# Patient Record
Sex: Female | Born: 1940 | Race: White | Hispanic: No | Smoking: Former smoker
Health system: Southern US, Community
[De-identification: ages and names within clinical notes are randomized; demographics above are authoritative.]

## PROBLEM LIST (undated history)

## (undated) DIAGNOSIS — K625 Hemorrhage of anus and rectum: Secondary | ICD-10-CM

## (undated) DIAGNOSIS — H409 Unspecified glaucoma: Secondary | ICD-10-CM

## (undated) DIAGNOSIS — M797 Fibromyalgia: Secondary | ICD-10-CM

## (undated) DIAGNOSIS — G4730 Sleep apnea, unspecified: Secondary | ICD-10-CM

## (undated) DIAGNOSIS — K5790 Diverticulosis of intestine, part unspecified, without perforation or abscess without bleeding: Secondary | ICD-10-CM

## (undated) DIAGNOSIS — T7840XA Allergy, unspecified, initial encounter: Secondary | ICD-10-CM

## (undated) DIAGNOSIS — J449 Chronic obstructive pulmonary disease, unspecified: Secondary | ICD-10-CM

## (undated) DIAGNOSIS — Z9989 Dependence on other enabling machines and devices: Secondary | ICD-10-CM

## (undated) DIAGNOSIS — K219 Gastro-esophageal reflux disease without esophagitis: Secondary | ICD-10-CM

## (undated) DIAGNOSIS — IMO0002 Reserved for concepts with insufficient information to code with codable children: Secondary | ICD-10-CM

## (undated) DIAGNOSIS — E039 Hypothyroidism, unspecified: Secondary | ICD-10-CM

## (undated) DIAGNOSIS — M810 Age-related osteoporosis without current pathological fracture: Secondary | ICD-10-CM

## (undated) DIAGNOSIS — M533 Sacrococcygeal disorders, not elsewhere classified: Secondary | ICD-10-CM

## (undated) DIAGNOSIS — J439 Emphysema, unspecified: Secondary | ICD-10-CM

## (undated) DIAGNOSIS — B009 Herpesviral infection, unspecified: Secondary | ICD-10-CM

## (undated) DIAGNOSIS — H269 Unspecified cataract: Secondary | ICD-10-CM

## (undated) DIAGNOSIS — M1990 Unspecified osteoarthritis, unspecified site: Secondary | ICD-10-CM

## (undated) DIAGNOSIS — F419 Anxiety disorder, unspecified: Secondary | ICD-10-CM

## (undated) DIAGNOSIS — N183 Chronic kidney disease, stage 3 unspecified: Secondary | ICD-10-CM

## (undated) DIAGNOSIS — K649 Unspecified hemorrhoids: Secondary | ICD-10-CM

## (undated) DIAGNOSIS — G4733 Obstructive sleep apnea (adult) (pediatric): Secondary | ICD-10-CM

## (undated) HISTORY — DX: Unspecified hemorrhoids: K64.9

## (undated) HISTORY — DX: Sleep apnea, unspecified: G47.30

## (undated) HISTORY — PX: SPINE SURGERY: SHX786

## (undated) HISTORY — DX: Diverticulosis of intestine, part unspecified, without perforation or abscess without bleeding: K57.90

## (undated) HISTORY — DX: Unspecified glaucoma: H40.9

## (undated) HISTORY — DX: Unspecified cataract: H26.9

## (undated) HISTORY — DX: Reserved for concepts with insufficient information to code with codable children: IMO0002

## (undated) HISTORY — DX: Anxiety disorder, unspecified: F41.9

## (undated) HISTORY — DX: Fibromyalgia: M79.7

## (undated) HISTORY — DX: Age-related osteoporosis without current pathological fracture: M81.0

## (undated) HISTORY — DX: Allergy, unspecified, initial encounter: T78.40XA

## (undated) HISTORY — DX: Hemorrhage of anus and rectum: K62.5

## (undated) HISTORY — DX: Gastro-esophageal reflux disease without esophagitis: K21.9

## (undated) HISTORY — DX: Dependence on other enabling machines and devices: Z99.89

## (undated) HISTORY — DX: Herpesviral infection, unspecified: B00.9

## (undated) HISTORY — PX: BUNIONECTOMY: SHX129

## (undated) HISTORY — DX: Sacrococcygeal disorders, not elsewhere classified: M53.3

## (undated) HISTORY — PX: TUBAL LIGATION: SHX77

## (undated) HISTORY — PX: OTHER SURGICAL HISTORY: SHX169

## (undated) HISTORY — DX: Chronic obstructive pulmonary disease, unspecified: J44.9

## (undated) HISTORY — PX: MENISCUS REPAIR: SHX5179

## (undated) HISTORY — DX: Obstructive sleep apnea (adult) (pediatric): G47.33

## (undated) HISTORY — PX: EYE SURGERY: SHX253

---

## 1997-10-17 ENCOUNTER — Other Ambulatory Visit: Admission: RE | Admit: 1997-10-17 | Discharge: 1997-10-17 | Payer: Self-pay | Admitting: Family Medicine

## 1999-06-02 ENCOUNTER — Encounter: Admission: RE | Admit: 1999-06-02 | Discharge: 1999-06-02 | Payer: Self-pay | Admitting: Family Medicine

## 1999-06-02 ENCOUNTER — Encounter: Payer: Self-pay | Admitting: Family Medicine

## 1999-09-16 ENCOUNTER — Encounter: Payer: Self-pay | Admitting: Family Medicine

## 1999-09-16 ENCOUNTER — Encounter: Admission: RE | Admit: 1999-09-16 | Discharge: 1999-09-16 | Payer: Self-pay | Admitting: Family Medicine

## 1999-11-11 ENCOUNTER — Encounter: Admission: RE | Admit: 1999-11-11 | Discharge: 1999-11-11 | Payer: Self-pay | Admitting: Family Medicine

## 1999-11-11 ENCOUNTER — Encounter: Payer: Self-pay | Admitting: Family Medicine

## 2000-09-14 ENCOUNTER — Encounter: Payer: Self-pay | Admitting: Family Medicine

## 2000-09-14 ENCOUNTER — Encounter: Admission: RE | Admit: 2000-09-14 | Discharge: 2000-09-14 | Payer: Self-pay | Admitting: Family Medicine

## 2000-09-19 ENCOUNTER — Encounter: Admission: RE | Admit: 2000-09-19 | Discharge: 2000-09-19 | Payer: Self-pay | Admitting: Family Medicine

## 2000-09-19 ENCOUNTER — Encounter: Payer: Self-pay | Admitting: Family Medicine

## 2000-09-21 ENCOUNTER — Encounter: Payer: Self-pay | Admitting: Family Medicine

## 2000-09-21 ENCOUNTER — Encounter: Admission: RE | Admit: 2000-09-21 | Discharge: 2000-09-21 | Payer: Self-pay | Admitting: Family Medicine

## 2000-11-21 ENCOUNTER — Encounter: Admission: RE | Admit: 2000-11-21 | Discharge: 2000-11-21 | Payer: Self-pay | Admitting: Family Medicine

## 2000-11-21 ENCOUNTER — Encounter: Payer: Self-pay | Admitting: Family Medicine

## 2001-09-24 ENCOUNTER — Encounter: Payer: Self-pay | Admitting: Family Medicine

## 2001-09-24 ENCOUNTER — Encounter: Admission: RE | Admit: 2001-09-24 | Discharge: 2001-09-24 | Payer: Self-pay | Admitting: Family Medicine

## 2001-10-11 ENCOUNTER — Encounter: Payer: Self-pay | Admitting: Neurosurgery

## 2001-10-16 ENCOUNTER — Encounter: Payer: Self-pay | Admitting: Neurosurgery

## 2001-10-16 ENCOUNTER — Inpatient Hospital Stay (HOSPITAL_COMMUNITY): Admission: RE | Admit: 2001-10-16 | Discharge: 2001-10-17 | Payer: Self-pay | Admitting: Neurosurgery

## 2001-11-22 ENCOUNTER — Encounter: Payer: Self-pay | Admitting: Neurosurgery

## 2001-11-22 ENCOUNTER — Encounter: Admission: RE | Admit: 2001-11-22 | Discharge: 2001-11-22 | Payer: Self-pay | Admitting: Neurosurgery

## 2002-03-20 ENCOUNTER — Encounter: Payer: Self-pay | Admitting: Neurosurgery

## 2002-03-20 ENCOUNTER — Encounter: Admission: RE | Admit: 2002-03-20 | Discharge: 2002-03-20 | Payer: Self-pay | Admitting: Neurosurgery

## 2002-10-02 ENCOUNTER — Encounter: Payer: Self-pay | Admitting: Family Medicine

## 2002-10-02 ENCOUNTER — Encounter: Admission: RE | Admit: 2002-10-02 | Discharge: 2002-10-02 | Payer: Self-pay | Admitting: Family Medicine

## 2003-04-09 ENCOUNTER — Ambulatory Visit (HOSPITAL_COMMUNITY): Admission: RE | Admit: 2003-04-09 | Discharge: 2003-04-09 | Payer: Self-pay | Admitting: *Deleted

## 2003-10-14 ENCOUNTER — Encounter: Admission: RE | Admit: 2003-10-14 | Discharge: 2003-10-14 | Payer: Self-pay | Admitting: Family Medicine

## 2004-03-25 ENCOUNTER — Other Ambulatory Visit: Admission: RE | Admit: 2004-03-25 | Discharge: 2004-03-25 | Payer: Self-pay | Admitting: Family Medicine

## 2004-10-21 ENCOUNTER — Encounter: Admission: RE | Admit: 2004-10-21 | Discharge: 2004-10-21 | Payer: Self-pay | Admitting: Family Medicine

## 2005-11-02 ENCOUNTER — Encounter: Admission: RE | Admit: 2005-11-02 | Discharge: 2005-11-02 | Payer: Self-pay | Admitting: Family Medicine

## 2006-03-09 ENCOUNTER — Encounter: Admission: RE | Admit: 2006-03-09 | Discharge: 2006-03-09 | Payer: Self-pay | Admitting: Family Medicine

## 2006-05-09 HISTORY — PX: CERVICAL FUSION: SHX112

## 2006-05-09 HISTORY — PX: TRIGGER FINGER RELEASE: SHX641

## 2006-07-27 DIAGNOSIS — G4733 Obstructive sleep apnea (adult) (pediatric): Secondary | ICD-10-CM

## 2006-07-27 HISTORY — DX: Obstructive sleep apnea (adult) (pediatric): G47.33

## 2006-11-06 ENCOUNTER — Encounter: Admission: RE | Admit: 2006-11-06 | Discharge: 2006-11-06 | Payer: Self-pay | Admitting: Family Medicine

## 2007-05-10 DIAGNOSIS — B009 Herpesviral infection, unspecified: Secondary | ICD-10-CM

## 2007-05-10 HISTORY — DX: Herpesviral infection, unspecified: B00.9

## 2007-06-26 ENCOUNTER — Other Ambulatory Visit: Admission: RE | Admit: 2007-06-26 | Discharge: 2007-06-26 | Payer: Self-pay | Admitting: Family Medicine

## 2007-10-09 ENCOUNTER — Encounter (INDEPENDENT_AMBULATORY_CARE_PROVIDER_SITE_OTHER): Payer: Self-pay | Admitting: General Surgery

## 2007-10-09 ENCOUNTER — Ambulatory Visit (HOSPITAL_COMMUNITY): Admission: RE | Admit: 2007-10-09 | Discharge: 2007-10-09 | Payer: Self-pay | Admitting: General Surgery

## 2007-11-21 ENCOUNTER — Encounter: Admission: RE | Admit: 2007-11-21 | Discharge: 2007-11-21 | Payer: Self-pay | Admitting: Family Medicine

## 2008-03-27 ENCOUNTER — Encounter: Admission: RE | Admit: 2008-03-27 | Discharge: 2008-03-27 | Payer: Self-pay | Admitting: Family Medicine

## 2008-12-08 ENCOUNTER — Encounter: Admission: RE | Admit: 2008-12-08 | Discharge: 2008-12-08 | Payer: Self-pay | Admitting: Family Medicine

## 2009-02-12 ENCOUNTER — Other Ambulatory Visit: Admission: RE | Admit: 2009-02-12 | Discharge: 2009-02-12 | Payer: Self-pay | Admitting: Family Medicine

## 2009-06-30 ENCOUNTER — Ambulatory Visit (HOSPITAL_BASED_OUTPATIENT_CLINIC_OR_DEPARTMENT_OTHER): Admission: RE | Admit: 2009-06-30 | Discharge: 2009-06-30 | Payer: Self-pay | Admitting: Orthopedic Surgery

## 2009-12-09 ENCOUNTER — Encounter: Admission: RE | Admit: 2009-12-09 | Discharge: 2009-12-09 | Payer: Self-pay | Admitting: Family Medicine

## 2010-05-31 ENCOUNTER — Encounter: Payer: Self-pay | Admitting: Family Medicine

## 2010-09-21 NOTE — Op Note (Signed)
NAMESTEPHAINE, [name] [name] NO.:  [id number]   MEDICAL RECORD NO.:  [id number]          PATIENT TYPE:  AMB   LOCATION:  DAY                          FACILITY:  [facility]   PHYSICIAN:  Ollen Gross. Vernell Morgans, M.D. DATE OF BIRTH:  [birth year]   DATE OF PROCEDURE:  10/09/2007  DATE OF DISCHARGE:                               OPERATIVE REPORT   PREOPERATIVE DIAGNOSES:  Internal and external hemorrhoids.   POSTOPERATIVE DIAGNOSES:  Internal and external hemorrhoids.   PROCEDURE:  Hemorrhoidectomy x2.   SURGEON:  Dr. Carolynne Edouard III.   ANESTHESIA:  General endotracheal.   DESCRIPTION OF PROCEDURE:  After informed consent was obtained, the  patient was brought to the operating room and left in a supine position  on the stretcher.  After adequate induction of general anesthesia, the  patient was moved into a prone position on the operating room table and  all pressure points were padded.  The patient was then moved into a  jackknife position and the buttocks were retracted laterally with tape.  The perirectal area was prepped with Betadine and draped in the usual  sterile manner.  The perirectal region was then infiltrated with quarter-  percent with Marcaine and 1 mL of Wydase and the tissue was massaged  gently for several minutes.  A small bullet retractor was initially  used, and the patient had significant internal and external hemorrhoids,  but no other abnormalities were noted.  A larger bullet retractor was  then used and two significant columns of internal and external  hemorrhoids were identified.  Each of these columns was grasped with  Allis clamps and elevated.  Each column was scored near its base with a  15 blade knife and then a hemostat clamp was placed across the base of  the hemorrhoid.  The distal portion of the hemorrhoid was then excised  sharply with Metzenbaum scissors.  The incision was then closed with  running 2-0 chromic stitches.  The last couple mm of each  incision were  left open for drainage purposes.  Once this was accomplished, the  incisions were nicely reapproximated.  The areas were completely  hemostatic.  The rectum looked a lot better.  At this point, some  dibucaine ointment and a small piece of Gelfoam were placed in the  rectum and a sterile dressing was applied.  The patient tolerated the  procedure well.  At the end of the case, all needle, sponge and  instrument counts were correct.  The patient was then awakened and taken  to the recovery room in stable condition.      Ollen Gross. Vernell Morgans, M.D.  Electronically Signed     PST/MEDQ  D:  10/09/2007  T:  10/09/2007  Job:  [id number]

## 2010-09-24 NOTE — Op Note (Signed)
[facility]  Patient:    IVIE, SAVITT Visit Number: [id number] MRN: [id number]          Service Type: SUR Location: [id number] Attending Physician:  Mariam Dollar Dictated by:   Garlon Hatchet., M.D. Proc. Date: 10/16/01 Admit Date:  10/16/2001 Discharge Date: 10/17/2001                             Operative Report  PREOPERATIVE DIAGNOSIS:  Cervical spondylosis and left greater than right C6 and C7 radiculopathies.  POSTOPERATIVE DIAGNOSIS:  Cervical spondylosis and left greater than right C6 and C7 radiculopathies.  OPERATION PERFORMED:  Anterior cervical diskectomies and fusion at C5-6 and C6-7 using a 6 mm patellar wedge at C5-6 and a 7 mm patellar wedge at C6-7, 40 mm Atlantis plate with four 13 mm variable angle screws.  SURGEON:  Garlon Hatchet., M.D.  ASSISTANT:  Julio Sicks, M.D.  ANESTHESIA:  General endotracheal.  INDICATIONS FOR PROCEDURE:  The patient is a very pleasant 70 year old female who has had long-standing neck and left and right arm pain with weakness in her triceps and biceps.  This has been going on for several months and has been refractory to anti-inflammatories and physical therapy.  Preoperative imaging showed cervical spondylosis with foraminal stenosis at C5-6 and C6-7. The patient was extensively recommended a two-level anterior cervical diskectomy and fusion and decided to proceed.  Risks and benefits were explained and understood by the patient.  DESCRIPTION OF PROCEDURE:  The patient was brought to the OR, induced under general anesthesia, positioned supine.  Neck in slight extension with 5 pounds of halter traction.  The right side of the neck was prepped and draped in the usual sterile fashion.  A curvilinear incision was made just off the midline to the anterior border of the sternocleidomastoid after preop x-ray localized the needle over the C6 vertebral body.  The superficial layer of the  platysma was dissected and divided longitudinally.  The avascular plane between the sternocleidomastoid and strap muscles was developed down to prevertebral fascia. The prevertebral fascia was dissected with Kitners.  The longus colli was then reflected laterally.  The intraoperative x-ray confirmed localization of the C5-6 disk space.  Annulotomies were then opened with a 15 blade scalpel.  Pituitary rongeurs were used to remove the anterior margin of the annulus.  Disk space was scraped and using a high speed drill, the end plates, the anterior annulus, and nucleus pulposus was drilled down to posterior osteophyte and posterior longitudinal ligament.  Then using a 1 and 2 mm Kerrison punch, the remainder of the osteophytes were removed first at the C5-6 disk space.  There was a large osteophytic process coming off the C5 vertebral body and uncinate process.  This was underbitten with a 1 and 2 mm Kerrison punches exposing the posterior longitudinal ligament.  This was also underbitten in a piecemeal fashion.  Both C6 neural foramina were widely decompressed and the thecal sac was decompressed centrally.  The end plates were then prepared to receive bone graft and Gelfoam was placed.  At the C6-7 disk space, again this procedure was repeated.  Using a 1 and 2 mm Kerrison punch, the very large osteophyte coming off the uncinate processes bilaterally was decompressed and removed and both proximal C7 neural foramina were decompressed.  The thecal sac centrally was decompressed. At the end of diskectomies, both C6 and C7 nerve  roots on both sides were widely decompressed.  Then the two Tutogen patellar wedges were sized, selected and a 7 mm wedge was fashioned and inserted at C6-7 approximately 1 mm deep  to the posterior vertebral body line.  A 6 mm patellar wedge was sized and selected at C5-6 and inserted this about 1 mm deep to the posterior vertebral body line.  A 40 mm Atlantis plate was  sized, selected and inserted after the wedges were in place and six 32 mm variable angled screws were drilled and tapped in place.  Postop x-ray confirmed good placement of bone graft, screws, and plates.  The wound was closed with 3-0 interrupted Vicryl after meticulous hemostasis and copious irrigation with 3-0 interrupted Vicryl in the platysma and 4-0 running subcuticular.  Benzoin and Steri-Strips were applied.  The patient was transferred to the recovery room in stable condition. Dictated by:   Garlon Hatchet., M.D. Attending Physician:  Mariam Dollar DD:  10/16/01 TD:  10/18/01 Job: [id number]

## 2010-11-09 ENCOUNTER — Other Ambulatory Visit: Payer: Self-pay | Admitting: Family Medicine

## 2010-11-09 DIAGNOSIS — Z1231 Encounter for screening mammogram for malignant neoplasm of breast: Secondary | ICD-10-CM

## 2010-12-13 ENCOUNTER — Ambulatory Visit
Admission: RE | Admit: 2010-12-13 | Discharge: 2010-12-13 | Disposition: A | Discharge status: Home / Self Care | Payer: BC Managed Care – PPO | Source: Ambulatory Visit | Attending: Family Medicine | Admitting: Family Medicine

## 2010-12-13 DIAGNOSIS — Z1231 Encounter for screening mammogram for malignant neoplasm of breast: Secondary | ICD-10-CM

## 2011-02-03 LAB — DIFFERENTIAL
Basophils Relative: 1
Eosinophils Absolute: 0.1
Eosinophils Relative: 3
Lymphs Abs: 1.3
Neutrophils Relative %: 51

## 2011-02-03 LAB — CBC
HCT: 36.4
Hemoglobin: 12.7
MCV: 91.3
Platelets: 200
RBC: 3.99
WBC: 4.2

## 2011-02-03 LAB — BASIC METABOLIC PANEL
CO2: 28
Calcium: 9.2
Creatinine, Ser: 0.7
GFR calc Af Amer: >60
GFR calc non Af Amer: >60
Potassium: 3.9
Sodium: 141

## 2011-11-22 ENCOUNTER — Other Ambulatory Visit: Payer: Self-pay | Admitting: Family Medicine

## 2011-11-22 DIAGNOSIS — Z1231 Encounter for screening mammogram for malignant neoplasm of breast: Secondary | ICD-10-CM

## 2011-12-14 ENCOUNTER — Ambulatory Visit
Admission: RE | Admit: 2011-12-14 | Discharge: 2011-12-14 | Disposition: A | Discharge status: Home / Self Care | Payer: BC Managed Care – PPO | Source: Ambulatory Visit | Attending: Family Medicine | Admitting: Family Medicine

## 2011-12-14 DIAGNOSIS — Z1231 Encounter for screening mammogram for malignant neoplasm of breast: Secondary | ICD-10-CM

## 2012-04-03 ENCOUNTER — Other Ambulatory Visit (HOSPITAL_COMMUNITY)
Admission: RE | Admit: 2012-04-03 | Discharge: 2012-04-03 | Disposition: A | Discharge status: Home / Self Care | Payer: BC Managed Care – PPO | Source: Ambulatory Visit | Attending: Family Medicine | Admitting: Family Medicine

## 2012-04-03 ENCOUNTER — Other Ambulatory Visit: Payer: Self-pay | Admitting: Family Medicine

## 2012-04-03 DIAGNOSIS — Z124 Encounter for screening for malignant neoplasm of cervix: Secondary | ICD-10-CM | POA: Insufficient documentation

## 2012-05-03 ENCOUNTER — Other Ambulatory Visit: Payer: Self-pay | Admitting: Family Medicine

## 2012-11-06 ENCOUNTER — Other Ambulatory Visit: Payer: Self-pay

## 2012-11-06 DIAGNOSIS — Z1231 Encounter for screening mammogram for malignant neoplasm of breast: Secondary | ICD-10-CM

## 2012-12-17 ENCOUNTER — Ambulatory Visit
Admission: RE | Admit: 2012-12-17 | Discharge: 2012-12-17 | Disposition: A | Discharge status: Home / Self Care | Payer: BC Managed Care – PPO | Source: Ambulatory Visit

## 2012-12-17 DIAGNOSIS — Z1231 Encounter for screening mammogram for malignant neoplasm of breast: Secondary | ICD-10-CM

## 2013-06-06 ENCOUNTER — Other Ambulatory Visit: Payer: Self-pay | Admitting: Dermatology

## 2013-06-21 ENCOUNTER — Other Ambulatory Visit: Payer: Self-pay | Admitting: Family Medicine

## 2013-09-12 ENCOUNTER — Other Ambulatory Visit (HOSPITAL_COMMUNITY): Payer: Self-pay | Admitting: Urology

## 2013-09-12 DIAGNOSIS — N2889 Other specified disorders of kidney and ureter: Secondary | ICD-10-CM

## 2013-09-20 ENCOUNTER — Ambulatory Visit (HOSPITAL_COMMUNITY)
Admission: RE | Admit: 2013-09-20 | Discharge: 2013-09-20 | Disposition: A | Discharge status: Home / Self Care | Payer: BC Managed Care – PPO | Source: Ambulatory Visit | Attending: Urology | Admitting: Urology

## 2013-09-20 DIAGNOSIS — N289 Disorder of kidney and ureter, unspecified: Secondary | ICD-10-CM | POA: Insufficient documentation

## 2013-09-20 DIAGNOSIS — N2889 Other specified disorders of kidney and ureter: Secondary | ICD-10-CM

## 2013-09-20 DIAGNOSIS — N281 Cyst of kidney, acquired: Secondary | ICD-10-CM | POA: Insufficient documentation

## 2013-09-20 MED ORDER — GADOBENATE DIMEGLUMINE 529 MG/ML IV SOLN
15.0000 mL | Freq: Once | INTRAVENOUS | Status: AC | PRN
  Administered 2013-09-20: 15 mL via INTRAVENOUS

## 2013-11-18 ENCOUNTER — Other Ambulatory Visit: Payer: Self-pay

## 2013-11-18 DIAGNOSIS — Z1231 Encounter for screening mammogram for malignant neoplasm of breast: Secondary | ICD-10-CM

## 2013-12-18 ENCOUNTER — Encounter (INDEPENDENT_AMBULATORY_CARE_PROVIDER_SITE_OTHER): Payer: Self-pay

## 2013-12-18 ENCOUNTER — Ambulatory Visit
Admission: RE | Admit: 2013-12-18 | Discharge: 2013-12-18 | Disposition: A | Discharge status: Home / Self Care | Payer: BC Managed Care – PPO | Source: Ambulatory Visit

## 2013-12-18 DIAGNOSIS — Z1231 Encounter for screening mammogram for malignant neoplasm of breast: Secondary | ICD-10-CM

## 2013-12-20 ENCOUNTER — Other Ambulatory Visit: Payer: Self-pay | Admitting: Family Medicine

## 2013-12-20 DIAGNOSIS — R928 Other abnormal and inconclusive findings on diagnostic imaging of breast: Secondary | ICD-10-CM

## 2013-12-30 ENCOUNTER — Ambulatory Visit
Admission: RE | Admit: 2013-12-30 | Discharge: 2013-12-30 | Disposition: A | Discharge status: Home / Self Care | Payer: BC Managed Care – PPO | Source: Ambulatory Visit | Attending: Family Medicine | Admitting: Family Medicine

## 2013-12-30 DIAGNOSIS — R928 Other abnormal and inconclusive findings on diagnostic imaging of breast: Secondary | ICD-10-CM

## 2014-03-14 ENCOUNTER — Encounter: Payer: Self-pay | Admitting: *Deleted

## 2014-04-10 LAB — COMPREHENSIVE METABOLIC PANEL: Calcium: 9.7 mg/dL

## 2014-07-16 LAB — BASIC METABOLIC PANEL
BUN: 12 mg/dL (ref 4–21)
Creatinine: 0.7 mg/dL (ref 0.5–1.1)
Glucose: 94 mg/dL
Potassium: 4.5 mmol/L (ref 3.4–5.3)
SODIUM: 141 mmol/L (ref 137–147)

## 2014-07-16 LAB — HEPATIC FUNCTION PANEL
ALT: 22 U/L (ref 7–35)
AST: 26 U/L (ref 13–35)
Alkaline Phosphatase: 86 U/L (ref 25–125)

## 2014-07-16 LAB — LIPID PANEL
CHOLESTEROL: 149 mg/dL (ref 0–200)
HDL: 56 mg/dL (ref 35–70)
LDL Cholesterol: 78 mg/dL
TRIGLYCERIDES: 78 mg/dL (ref 40–160)

## 2014-11-07 ENCOUNTER — Other Ambulatory Visit: Payer: Self-pay | Admitting: Family Medicine

## 2014-11-07 DIAGNOSIS — R911 Solitary pulmonary nodule: Secondary | ICD-10-CM

## 2014-11-13 ENCOUNTER — Ambulatory Visit
Admission: RE | Admit: 2014-11-13 | Discharge: 2014-11-13 | Disposition: A | Discharge status: Home / Self Care | Payer: Medicare Other | Source: Ambulatory Visit | Attending: Family Medicine | Admitting: Family Medicine

## 2014-11-13 DIAGNOSIS — R911 Solitary pulmonary nodule: Secondary | ICD-10-CM

## 2015-01-06 ENCOUNTER — Ambulatory Visit (INDEPENDENT_AMBULATORY_CARE_PROVIDER_SITE_OTHER): Payer: Medicare Other | Admitting: Family Medicine

## 2015-01-06 ENCOUNTER — Encounter: Payer: Self-pay | Admitting: Family Medicine

## 2015-01-06 VITALS — BP 111/68 | HR 73 | Wt 161.0 lb

## 2015-01-06 DIAGNOSIS — G4733 Obstructive sleep apnea (adult) (pediatric): Secondary | ICD-10-CM | POA: Diagnosis not present

## 2015-01-06 DIAGNOSIS — H409 Unspecified glaucoma: Secondary | ICD-10-CM

## 2015-01-06 DIAGNOSIS — E785 Hyperlipidemia, unspecified: Secondary | ICD-10-CM | POA: Diagnosis not present

## 2015-01-06 DIAGNOSIS — Z9989 Dependence on other enabling machines and devices: Secondary | ICD-10-CM

## 2015-01-06 DIAGNOSIS — M810 Age-related osteoporosis without current pathological fracture: Secondary | ICD-10-CM

## 2015-01-06 DIAGNOSIS — E782 Mixed hyperlipidemia: Secondary | ICD-10-CM | POA: Insufficient documentation

## 2015-01-06 DIAGNOSIS — Z1231 Encounter for screening mammogram for malignant neoplasm of breast: Secondary | ICD-10-CM

## 2015-01-06 DIAGNOSIS — M797 Fibromyalgia: Secondary | ICD-10-CM

## 2015-01-06 NOTE — Progress Notes (Signed)
CC: [name] [name] is a 74 y.o. female is here for Establish Care   Subjective: HPI:  Very pleasant 74 year old here to establish care, wife of [name] [name]  Reports a history of fibromyalgia. Localized in her shoulders, thighs. It can present for matter of years. It improves when she does physical activity however over the last 2 weeks she's been slacking and symptoms are mildly worsening. It's described as a soreness to the touch. She joined Geophysicist/field seismologist at [employer] and is about to begin a almost daily exercise routine. She denies any new weakness or motor or sensory disturbances. Denies any joint pain. She had some benefit from Cymbalta however got to be too expensive.  She reports a history of multiple abnormal mammograms which have led to diagnostic mammograms which have only revealed fibrocystic breast changes. She's never required a biopsy. She denies any breast complaints today but is now due for routine screening mammogram.  She has a history of obstructive sleep apnea currently using a CPAP machine. She is uncertain of his settings she uses it on nightly basis and states that it helps relieve past nonrestorative sleep  She reports a history of hyperlipidemia. It's unknown how long she's had this condition. She is taking 10 mg of atorvastatin daily. She is not sure when her last lipid panel was checked.  Reports a history of glaucoma and she has seen an ophthalmologist every 6 months. She denies any vision loss.  She reports a history of osteoporosis with No history of fractures. She is currently taking Fosamax. She is uncertain of her most recent DEXA scan but she does believe it was since 2009, the last imaging that we have in our system.  Review of Systems - General ROS: negative for - chills, fever, night sweats, weight gain or weight loss Ophthalmic ROS: negative for - decreased vision Psychological ROS: negative for - anxiety or depression ENT ROS: negative for - hearing  change, nasal congestion, tinnitus or allergies Hematological and Lymphatic ROS: negative for - bleeding problems, bruising or swollen lymph nodes Breast ROS: negative Respiratory ROS: no cough, shortness of breath, or wheezing Cardiovascular ROS: no chest pain or dyspnea on exertion Gastrointestinal ROS: no abdominal pain, change in bowel habits, or black or bloody stools Genito-Urinary ROS: negative for - genital discharge, genital ulcers, incontinence or abnormal bleeding from genitals Musculoskeletal ROS: negative for - joint pain or muscle pain Neurological ROS: negative for - headaches or memory loss Dermatological ROS: negative for lumps, mole changes, rash and skin lesion changes  Past Medical History  Diagnosis Date  . OSA on CPAP 07/27/06  . HSV-2 (herpes simplex virus 2) infection 2009  . Anxiety     intermittent  . Rectal bleeding     with hemorrhoids  . GERD (gastroesophageal reflux disease)   . Fibromyalgia   . Osteoporosis   . blood in stool   . Diverticulosis   . Hemorrhoids   . Glaucoma     No past surgical history on file. Family History  Problem Relation Age of Onset  . Heart attack Father   . Hyperlipidemia Mother   . Hypertension Mother     Social History   Social History  . Marital Status: Married    Spouse Name: N/A  . Number of Children: N/A  . Years of Education: N/A   Occupational History  . Not on file.   Social History Main Topics  . Smoking status: Former Smoker    Quit date: 03/07/1982  .  Smokeless tobacco: Not on file  . Alcohol Use: 0.0 oz/week    0 Standard drinks or equivalent per week     Comment: 2 drinks per month  . Drug Use: No  . Sexual Activity: No   Other Topics Concern  . Not on file   Social History Narrative   Tobacco use cigarettes: former smoker, tobacco history last updated 10/28/2013. No smoking. No tobacco exposure, no alcohol. No caffeine. No recreational drug use. Exercise: yes, some walking-not much.  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 Marital status: married.     Objective: BP 111/68 mmHg  Pulse 73  Wt 161 lb (73.029 kg)  General: Alert and Oriented, No Acute Distress HEENT: Pupils equal, round, reactive to light. Conjunctivae clear.  External ears unremarkable, canals clear with intact TMs with appropriate landmarks.  Middle ear appears open without effusion. Pink inferior turbinates.  Moist mucous membranes, pharynx without inflammation nor lesions.  Neck supple without palpable lymphadenopathy nor abnormal masses. Lungs: Clear to auscultation bilaterally, no wheezing/ronchi/rales.  Comfortable work of breathing. Good air movement. Cardiac: Regular rate and rhythm. Normal S1/S2.  No murmurs, rubs, nor gallops.   Extremities: No peripheral edema.  Strong peripheral pulses.full range of motion and strength in both upper and lower extremities.  Mental Status: No depression, anxiety, nor agitation. Skin: Warm and dry.  Assessment & Plan: Dorothie was seen today for establish care.  Diagnoses and all orders for this visit:  Encounter for screening mammogram for breast cancer -     MM DIGITAL SCREENING BILATERAL  Osteoporosis  Obstructive sleep apnea on CPAP  Hyperlipidemia  Fibromyalgia  Glaucoma   Osteoporosis: Continue Fosamax pending outside records of most recent DEXA scan,I like to have these at least every 2 years. Obstructive sleep apnea: Currently controlled continued nightly CPAP machine use  hyperlipidemia: Continue on atorvastatin daily requesting most recent lipid panel, I would like to see results within the last year. Fibromyalgia: Currently controlled with Zoloft and exercise. Glaucoma: Continue see ophthalmology twice a year.   Return in about 3 months (around 04/08/2015) for Follow Up.

## 2015-01-21 ENCOUNTER — Ambulatory Visit: Payer: Medicare Other

## 2015-01-28 ENCOUNTER — Ambulatory Visit (INDEPENDENT_AMBULATORY_CARE_PROVIDER_SITE_OTHER): Payer: Medicare Other

## 2015-01-28 DIAGNOSIS — Z1231 Encounter for screening mammogram for malignant neoplasm of breast: Secondary | ICD-10-CM

## 2015-02-05 ENCOUNTER — Encounter: Payer: Self-pay | Admitting: Family Medicine

## 2015-02-05 DIAGNOSIS — K589 Irritable bowel syndrome without diarrhea: Secondary | ICD-10-CM | POA: Insufficient documentation

## 2015-02-05 DIAGNOSIS — R918 Other nonspecific abnormal finding of lung field: Secondary | ICD-10-CM | POA: Insufficient documentation

## 2015-02-05 DIAGNOSIS — I2510 Atherosclerotic heart disease of native coronary artery without angina pectoris: Secondary | ICD-10-CM | POA: Insufficient documentation

## 2015-02-05 DIAGNOSIS — K625 Hemorrhage of anus and rectum: Secondary | ICD-10-CM | POA: Insufficient documentation

## 2015-03-24 ENCOUNTER — Telehealth: Payer: Self-pay

## 2015-03-24 MED ORDER — SERTRALINE HCL 50 MG PO TABS: 50.0000 mg | ORAL_TABLET | Freq: Every day | ORAL | Status: DC

## 2015-03-24 NOTE — Telephone Encounter (Signed)
Patient needs a refill on sertraline. Historical medication.

## 2015-03-24 NOTE — Telephone Encounter (Signed)
Rx sent to [facility] on [address] 

## 2015-04-08 ENCOUNTER — Ambulatory Visit (INDEPENDENT_AMBULATORY_CARE_PROVIDER_SITE_OTHER): Payer: Medicare Other | Admitting: Family Medicine

## 2015-04-08 ENCOUNTER — Encounter: Payer: Self-pay | Admitting: Family Medicine

## 2015-04-08 VITALS — BP 130/77 | HR 71 | Wt 161.0 lb

## 2015-04-08 DIAGNOSIS — K589 Irritable bowel syndrome without diarrhea: Secondary | ICD-10-CM

## 2015-04-08 DIAGNOSIS — M797 Fibromyalgia: Secondary | ICD-10-CM | POA: Diagnosis not present

## 2015-04-08 DIAGNOSIS — Z23 Encounter for immunization: Secondary | ICD-10-CM

## 2015-04-08 MED ORDER — ATORVASTATIN CALCIUM 10 MG PO TABS: 10.0000 mg | ORAL_TABLET | Freq: Every day | ORAL | Status: DC

## 2015-04-08 MED ORDER — DULOXETINE HCL 30 MG PO CPEP: 30.0000 mg | ORAL_CAPSULE | Freq: Every day | ORAL | Status: DC

## 2015-04-08 NOTE — Progress Notes (Signed)
CC: [name] CopasYvonne R [name] is a 74 y.o. female is here for Medication Refill   Subjective: HPI:  Follow-up fibromyalgia: Tells me symptoms have not gotten better or worse. It's currently localized in the shoulders but not necessarily inside the joints. Symptoms are improved with exercise. Denies any joint pain or weakness. Denies any overlying skin changes  Follow-up IBS: 1-2 times a week she'll have a day where she feels constipated and has significant abdominal pain localized low in the pelvis. This is relieved if she is able to have a defecation. Occasionally MiraLAX will help. She wants no further something other than Zoloft that she can take. She denies any unintentional weight loss or waking up from pain. She denies any diarrhea or blood in her stool. No nausea or decreased appetite.   Review Of Systems Outlined In HPI  Past Medical History  Diagnosis Date  . OSA on CPAP 07/27/06  . HSV-2 (herpes simplex virus 2) infection 2009  . Anxiety     intermittent  . Rectal bleeding     with hemorrhoids  . GERD (gastroesophageal reflux disease)   . Fibromyalgia   . Osteoporosis   . blood in stool   . Diverticulosis   . Hemorrhoids   . Glaucoma     Past Surgical History  Procedure Laterality Date  . Hemorrhoids removed     Family History  Problem Relation Age of Onset  . Heart attack Father   . Hyperlipidemia Mother   . Hypertension Mother     Social History   Social History  . Marital Status: Married    Spouse Name: N/A  . Number of Children: N/A  . Years of Education: N/A   Occupational History  . Not on file.   Social History Main Topics  . Smoking status: Former Smoker    Quit date: 03/07/1982  . Smokeless tobacco: Not on file  . Alcohol Use: 0.0 oz/week    0 Standard drinks or equivalent per week     Comment: 2 drinks per month  . Drug Use: No  . Sexual Activity: No   Other Topics Concern  . Not on file   Social History Narrative   Tobacco use cigarettes:  former smoker, tobacco history last updated 10/28/2013. No smoking. No tobacco exposure, no alcohol. No caffeine. No recreational drug use. Exercise: yes, some walking-not much. 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 Marital status: married.     Objective: BP 130/77 mmHg  Pulse 71  Wt 161 lb (73.029 kg)  Vital signs reviewed. General: Alert and Oriented, No Acute Distress HEENT: Pupils equal, round, reactive to light. Conjunctivae clear.  External ears unremarkable.  Moist mucous membranes. Lungs: Clear and comfortable work of breathing, speaking in full sentences without accessory muscle use. Cardiac: Regular rate and rhythm.  Neuro: CN II-XII grossly intact, gait normal. Extremities: No peripheral edema.  Strong peripheral pulses.  Mental Status: No depression, anxiety, nor agitation. Logical though process. Skin: Warm and dry.  Assessment & Plan: [name] [name] was seen today for medication refill.  Diagnoses and all orders for this visit:  Fibromyalgia  IBS (irritable bowel syndrome)  Encounter for immunization  Other orders -     atorvastatin (LIPITOR) 10 MG tablet; Take 1 tablet (10 mg total) by mouth daily. -     DULoxetine (CYMBALTA) 30 MG capsule; Take 1 capsule (30 mg total) by mouth daily. -     Flu Vaccine QUAD 36+ mos IM   Fibromyalgia: Stable IBS: uncontrolled  chronic condition,If she can afford Cymbalta we will try this in place of Zoloft. She's been on this in the past and told me it worked from what she can recall however again to get too expensive on her prior insurance plan.   Return if symptoms worsen or fail to improve, for Anytime in December for annual physical..

## 2015-04-15 ENCOUNTER — Encounter: Payer: Self-pay | Admitting: Family Medicine

## 2015-04-23 ENCOUNTER — Ambulatory Visit (INDEPENDENT_AMBULATORY_CARE_PROVIDER_SITE_OTHER): Payer: Medicare Other | Admitting: Family Medicine

## 2015-04-23 ENCOUNTER — Encounter: Payer: Self-pay | Admitting: Family Medicine

## 2015-04-23 VITALS — BP 136/77 | HR 75 | Wt 164.0 lb

## 2015-04-23 DIAGNOSIS — K625 Hemorrhage of anus and rectum: Secondary | ICD-10-CM | POA: Diagnosis not present

## 2015-04-23 DIAGNOSIS — E785 Hyperlipidemia, unspecified: Secondary | ICD-10-CM

## 2015-04-23 DIAGNOSIS — Z0000 Encounter for general adult medical examination without abnormal findings: Secondary | ICD-10-CM

## 2015-04-23 LAB — COMPLETE METABOLIC PANEL WITH GFR
ALBUMIN: 4.1 g/dL (ref 3.6–5.1)
ALK PHOS: 74 U/L (ref 33–130)
ALT: 20 U/L (ref 6–29)
AST: 24 U/L (ref 10–35)
BUN: 15 mg/dL (ref 7–25)
CALCIUM: 9 mg/dL (ref 8.6–10.4)
CO2: 27 mmol/L (ref 20–31)
Chloride: 104 mmol/L (ref 98–110)
Creat: 0.62 mg/dL (ref 0.60–0.93)
GFR, Est African American: >89 mL/min (ref >=60)
GFR, Est Non African American: 88 mL/min (ref >=60)
GLUCOSE: 95 mg/dL (ref 65–99)
Potassium: 4.2 mmol/L (ref 3.5–5.3)
SODIUM: 138 mmol/L (ref 135–146)
Total Bilirubin: 0.9 mg/dL (ref 0.2–1.2)
Total Protein: 7.3 g/dL (ref 6.1–8.1)

## 2015-04-23 LAB — CBC
HEMATOCRIT: 38.2 % (ref 36.0–46.0)
HEMOGLOBIN: 13 g/dL (ref 12.0–15.0)
MCH: 31.6 pg (ref 26.0–34.0)
MCHC: 34 g/dL (ref 30.0–36.0)
MCV: 92.9 fL (ref 78.0–100.0)
MPV: 11.6 fL (ref 8.6–12.4)
Platelets: 236 10*3/uL (ref 150–400)
RBC: 4.11 MIL/uL (ref 3.87–5.11)
RDW: 13.7 % (ref 11.5–15.5)
WBC: 6.2 10*3/uL (ref 4.0–10.5)

## 2015-04-23 LAB — LIPID PANEL
Cholesterol: 156 mg/dL (ref 125–200)
HDL: 61 mg/dL (ref >=46)
LDL Cholesterol: 77 mg/dL (ref <130)
Total CHOL/HDL Ratio: 2.6 Ratio (ref <=5.0)
Triglycerides: 92 mg/dL (ref <150)
VLDL: 18 mg/dL (ref <30)

## 2015-04-23 NOTE — Progress Notes (Signed)
Subjective:    [name] [name] is a 74 y.o. female who presents for Medicare Annual/Subsequent preventive examination.  Preventive Screening-Counseling & Management  Tobacco History  Smoking status  . Former Smoker  . Quit date: 03/07/1982  Smokeless tobacco  . Not on file    Colonoscopy: UTD from 2014 Papsmear: Not indicated Mammogram: UTD from September  DEXA: UTD from 2016  Influenza Vaccine: UTD Pneumovax: UTD Td/Tdap: UTD Zoster: UTD  Requesting complete physical exam with no acute complaints  Problems Prior to Visit 1. HLD  Current Problems (verified) Patient Active Problem List   Diagnosis Date Noted  . ASCVD (arteriosclerotic cardiovascular disease) 02/05/2015  . IBS (irritable bowel syndrome) 02/05/2015  . Rectal bleeding 02/05/2015  . Pulmonary nodules 02/05/2015  . Glaucoma 01/06/2015  . Fibromyalgia 01/06/2015  . Hyperlipidemia 01/06/2015  . Obstructive sleep apnea on CPAP 01/06/2015  . Osteoporosis 01/06/2015    Medications Prior to Visit Current Outpatient Prescriptions on File Prior to Visit  Medication Sig Dispense Refill  . alendronate (FOSAMAX) 70 MG tablet Take 70 mg by mouth once a week. Take with a full glass of water on an empty stomach.    Marland Kitchen aspirin 81 MG tablet Take 81 mg by mouth daily.    Marland Kitchen atorvastatin (LIPITOR) 10 MG tablet Take 1 tablet (10 mg total) by mouth daily. 90 tablet 1  . DULoxetine (CYMBALTA) 30 MG capsule Take 1 capsule (30 mg total) by mouth daily. 30 capsule 3  . Glucosamine-Chondroit-Vit C-Mn (GLUCOSAMINE CHONDR 500 COMPLEX PO) Take by mouth.    . hydrocortisone (ANUSOL-HC) 25 MG suppository Place 25 mg rectally.    Marland Kitchen loratadine (CLARITIN) 10 MG tablet Take 10 mg by mouth.    . Multiple Vitamin (MULTIVITAMINS PO) Take by mouth.    . polyethylene glycol (MIRALAX / GLYCOLAX) packet Take 17 g by mouth daily.    . sertraline (ZOLOFT) 50 MG tablet Take 1 tablet (50 mg total) by mouth daily. 90 tablet 1  . timolol  (BETIMOL) 0.5 % ophthalmic solution 1 drop.    . Vit C-Cholecalciferol-Rose Hip 347 101 9460-20 MG-UNIT-MG CAPS Take by mouth.     No current facility-administered medications on file prior to visit.    Current Medications (verified) Current Outpatient Prescriptions  Medication Sig Dispense Refill  . alendronate (FOSAMAX) 70 MG tablet Take 70 mg by mouth once a week. Take with a full glass of water on an empty stomach.    Marland Kitchen aspirin 81 MG tablet Take 81 mg by mouth daily.    Marland Kitchen atorvastatin (LIPITOR) 10 MG tablet Take 1 tablet (10 mg total) by mouth daily. 90 tablet 1  . DULoxetine (CYMBALTA) 30 MG capsule Take 1 capsule (30 mg total) by mouth daily. 30 capsule 3  . Glucosamine-Chondroit-Vit C-Mn (GLUCOSAMINE CHONDR 500 COMPLEX PO) Take by mouth.    . hydrocortisone (ANUSOL-HC) 25 MG suppository Place 25 mg rectally.    Marland Kitchen loratadine (CLARITIN) 10 MG tablet Take 10 mg by mouth.    . Multiple Vitamin (MULTIVITAMINS PO) Take by mouth.    . polyethylene glycol (MIRALAX / GLYCOLAX) packet Take 17 g by mouth daily.    . sertraline (ZOLOFT) 50 MG tablet Take 1 tablet (50 mg total) by mouth daily. 90 tablet 1  . timolol (BETIMOL) 0.5 % ophthalmic solution 1 drop.    . Vit C-Cholecalciferol-Rose Hip 347 101 9460-20 MG-UNIT-MG CAPS Take by mouth.     No current facility-administered medications for this visit.     Allergies (verified) Alphagan p;  Atorvastatin; Codeine; Penicillins; and Sulfur   PAST HISTORY  Family History Family History  Problem Relation Age of Onset  . Heart attack Father   . Hyperlipidemia Mother   . Hypertension Mother     Social History Social History  Substance Use Topics  . Smoking status: Former Smoker    Quit date: 03/07/1982  . Smokeless tobacco: Not on file  . Alcohol Use: 0.0 oz/week    0 Standard drinks or equivalent per week     Comment: 2 drinks per month     Are there smokers in your home (other than you)? No  Risk Factors Current exercise habits: 2x a  week at [facility]  Dietary issues discussed: DASH   Cardiac risk factors: advanced age (older than 58 for men, 110 for women), dyslipidemia and obesity (BMI >= 30 kg/m2).  Depression Screen (Note: if answer to either of the following is "Yes", a more complete depression screening is indicated)   Over the past two weeks, have you felt down, depressed or hopeless? No  Over the past two weeks, have you felt little interest or pleasure in doing things? No  Have you lost interest or pleasure in daily life? No  Do you often feel hopeless? No  Do you cry easily over simple problems? No  Activities of Daily Living In your present state of health, do you have any difficulty performing the following activities?:  Driving? No Managing money?  No Feeding yourself? No Getting from bed to chair? No Climbing a flight of stairs? No Preparing food and eating?: No Bathing or showering? No Getting dressed: No Getting to the toilet? No Using the toilet:No Moving around from place to place: No In the past year have you fallen or had a near fall?:No   Are you sexually active?  No  Do you have more than one partner?  No  Hearing Difficulties: No Do you often ask people to speak up or repeat themselves? No Do you experience ringing or noises in your ears? No Do you have difficulty understanding soft or whispered voices? No   Do you feel that you have a problem with memory? No  Do you often misplace items? No  Do you feel safe at home?  Yes  Cognitive Testing  Alert? Yes  Normal Appearance?Yes  Oriented to person? Yes  Place? Yes   Time? Yes  Recall of three objects?  Yes  Can perform simple calculations? Yes  Displays appropriate judgment?Yes  Can read the correct time from a watch face?Yes   Advanced Directives have been discussed with the patient? Yes  List the Names of Other Physician/Practitioners you currently use: 1.  [facility]  Indicate any recent Medical Services you may have received  from other than [facility] providers in the past year (date may be approximate).  Immunization History  Administered Date(s) Administered  . Influenza,inj,Quad PF,36+ Mos 04/08/2015  . Pneumococcal Conjugate-13 04/10/2014  . Pneumococcal Polysaccharide-23 03/17/2004  . Tdap 06/26/2007  . Zoster 03/01/2006    Screening Tests Health Maintenance  Topic Date Due  . INFLUENZA VACCINE  12/08/2015  . TETANUS/TDAP  06/25/2017  . DEXA SCAN  Completed  . ZOSTAVAX  Completed  . PNA vac Low Risk Adult  Completed    All answers were reviewed with the patient and necessary referrals were made:  Laren Boom, DO   04/23/2015   History reviewed: allergies, current medications, past family history, past medical history, past social history, past surgical history and problem list  Review of Systems Review of Systems - General ROS: negative for - chills, fever, night sweats, weight gain or weight loss Ophthalmic ROS: negative for - decreased vision Psychological ROS: negative for - anxiety or depression ENT ROS: negative for - hearing change, nasal congestion, tinnitus or allergies Hematological and Lymphatic ROS: negative for - bleeding problems, bruising or swollen lymph nodes Breast ROS: negative Respiratory ROS: no cough, shortness of breath, or wheezing Cardiovascular ROS: no chest pain or dyspnea on exertion Gastrointestinal ROS: no abdominal pain, change in bowel habits, or black or bloody stools Genito-Urinary ROS: negative for - genital discharge, genital ulcers, incontinence or abnormal bleeding from genitals Musculoskeletal ROS: negative for - joint pain or muscle pain Neurological ROS: negative for - headaches or memory loss Dermatological ROS: negative for lumps, mole changes, rash and skin lesion changes   Objective:     Vision by Snellen chart: right eye:20/30, left eye:20/40  There is no height or weight on file to calculate BMI. There were no vitals taken for this  visit.  General: No Acute Distress HEENT: Atraumatic, normocephalic, conjunctivae normal without scleral icterus.  No nasal discharge, hearing grossly intact, TMs with good landmarks bilaterally with no middle ear abnormalities, posterior pharynx clear without oral lesions. Neck: Supple, trachea midline, no cervical nor supraclavicular adenopathy. Pulmonary: Clear to auscultation bilaterally without wheezing, rhonchi, nor rales. Cardiac: Regular rate and rhythm.  No murmurs, rubs, nor gallops. No peripheral edema.  2+ peripheral pulses bilaterally. Abdomen: Bowel sounds normal.  No masses.  Non-tender without rebound.  Negative Murphy's sign. MSK: Grossly intact, no signs of weakness.  Full strength throughout upper and lower extremities.  Full ROM in upper and lower extremities.  No midline spinal tenderness. Neuro: Gait unremarkable, CN II-XII grossly intact.  C5-C6 Reflex 2/4 Bilaterally, L4 Reflex 2/4 Bilaterally.  Cerebellar function intact. Skin: No rashes. Psych: Alert and oriented to person/place/time.  Thought process normal. No anxiety/depression.      Assessment:     Minor vision loss otherwise normal exam. Up-to-date on preventative services other than blood work today.     Plan:     During the course of the visit the patient was educated and counseled about appropriate screening and preventive services including:    Labs  Diet review for nutrition referral? Not Indicated ____   Patient Instructions (the written plan) was given to the patient.  Medicare Attestation I have personally reviewed: The patient's medical and social history Their use of alcohol, tobacco or illicit drugs Their current medications and supplements The patient's functional ability including ADLs,fall risks, home safety risks, cognitive, and hearing and visual impairment Diet and physical activities Evidence for depression or mood disorders  The patient's weight, height, BMI, and visual  acuity have been recorded in the chart.  I have made referrals, counseling, and provided education to the patient based on review of the above and I have provided the patient with a written personalized care plan for preventive services.     Laren BoomHommel, Armistead Sult, DO   04/23/2015

## 2015-04-23 NOTE — Patient Instructions (Signed)
Dr. Grantley Savage's General Advice Following Your Complete Physical Exam  The Benefits of Regular Exercise: Unless you suffer from an uncontrolled cardiovascular condition, studies strongly suggest that regular exercise and physical activity will add to both the quality and length of your life.  The World Health Organization recommends 150 minutes of moderate intensity aerobic activity every week.  This is best split over 3-4 days a week, and can be as simple as a brisk walk for just over 35 minutes "most days of the week".  This type of exercise has been shown to lower LDL-Cholesterol, lower average blood sugars, lower blood pressure, lower cardiovascular disease risk, improve memory, and increase one's overall sense of wellbeing.  The addition of anaerobic (or "strength training") exercises offers additional benefits including but not limited to increased metabolism, prevention of osteoporosis, and improved overall cholesterol levels.  How Can I Strive For A Low-Fat Diet?: Current guidelines recommend that 25-35 percent of your daily energy (food) intake should come from fats.  One might ask how can this be achieved without having to dissect each meal on a daily basis?  Switch to skim or 1% milk instead of whole milk.  Focus on lean meats such as ground turkey, fresh fish, baked chicken, and lean cuts of beef as your source of dietary protein.  Limit saturated fat consumption to less than 10% of your daily caloric intake.  Limit trans fatty acid consumption primarily by limiting synthetic trans fats such as partially hydrogenated oils (Ex: fried fast foods).  Substitute olive or vegetable oil for solid fats where possible.  Moderation of Salt Intake: Provided you don't carry a diagnosis of congestive heart failure nor renal failure, I recommend a daily allowance of no more than 2300 mg of salt (sodium).  Keeping under this daily goal is associated with a decreased risk of cardiovascular events, creeping  above it can lead to elevated blood pressures and increases your risk of cardiovascular events.  Milligrams (mg) of salt is listed on all nutrition labels, and your daily intake can add up faster than you think.  Most canned and frozen dinners can pack in over half your daily salt allowance in one meal.    Lifestyle Health Risks: Certain lifestyle choices carry specific health risks.  As you may already know, tobacco use has been associated with increasing one's risk of cardiovascular disease, pulmonary disease, numerous cancers, among many other issues.  What you may not know is that there are medications and nicotine replacement strategies that can more than double your chances of successfully quitting.  I would be thrilled to help manage your quitting strategy if you currently use tobacco products.  When it comes to alcohol use, I've yet to find an "ideal" daily allowance.  Provided an individual does not have a medical condition that is exacerbated by alcohol consumption, general guidelines determine "safe drinking" as no more than two standard drinks for a man or no more than one standard drink for a female per day.  However, much debate still exists on whether any amount of alcohol consumption is technically "safe".  My general advice, keep alcohol consumption to a minimum for general health promotion.  If you or others believe that alcohol, tobacco, or recreational drug use is interfering with your life, I would be happy to provide confidential counseling regarding treatment options.  General "Over The Counter" Nutrition Advice: Postmenopausal women should aim for a daily calcium intake of 1200 mg, however a significant portion of this might already be   provided by diets including milk, yogurt, cheese, and other dairy products.  Vitamin D has been shown to help preserve bone density, prevent fatigue, and has even been shown to help reduce falls in the elderly.  Ensuring a daily intake of 800 Units of  Vitamin D is a good place to start to enjoy the above benefits, we can easily check your Vitamin D level to see if you'd potentially benefit from supplementation beyond 800 Units a day.  Folic Acid intake should be of particular concern to women of childbearing age.  Daily consumption of 400-800 mcg of Folic Acid is recommended to minimize the chance of spinal cord defects in a fetus should pregnancy occur.    For many adults, accidents still remain one of the most common culprits when it comes to cause of death.  Some of the simplest but most effective preventitive habits you can adopt include regular seatbelt use, proper helmet use, securing firearms, and regularly testing your smoke and carbon monoxide detectors.  Sonoma Firkus B. Haston Casebolt DO [facility] [address], [address] [place], [place] [address] Phone: [phone]  

## 2015-06-09 ENCOUNTER — Other Ambulatory Visit: Payer: Self-pay | Admitting: Family Medicine

## 2015-07-22 ENCOUNTER — Ambulatory Visit (INDEPENDENT_AMBULATORY_CARE_PROVIDER_SITE_OTHER): Payer: Medicare Other

## 2015-07-22 ENCOUNTER — Encounter: Payer: Self-pay | Admitting: Family Medicine

## 2015-07-22 ENCOUNTER — Ambulatory Visit (INDEPENDENT_AMBULATORY_CARE_PROVIDER_SITE_OTHER): Payer: Medicare Other | Admitting: Family Medicine

## 2015-07-22 VITALS — BP 136/74 | HR 87 | Wt 162.0 lb

## 2015-07-22 DIAGNOSIS — M797 Fibromyalgia: Secondary | ICD-10-CM

## 2015-07-22 DIAGNOSIS — M25551 Pain in right hip: Secondary | ICD-10-CM

## 2015-07-22 MED ORDER — DULOXETINE HCL 30 MG PO CPEP: ORAL_CAPSULE | ORAL | Status: DC

## 2015-07-22 NOTE — Progress Notes (Signed)
CC: [name] CopasYvonne R [name] is a 75 y.o. female is here for Medication Refill and Hip Pain   Subjective: HPI:  Follow fibromyalgia: She tells me she had almost no symptoms to complain about with respect to muscle belly pain and skin tenderness since starting on Cymbalta. She is able to afford the medication and causes no known side effects. She denies any muscle weakness or pain other than that described below.  For the past month she's felt hip pain localized in the buttock that radiates into the right anterior hip in the groin area. It's worse with twisting motions. It's worse when bearing weight. Nothing else seems to make it better or worse. No interventions as of yet. She denies any accidents. She denies any genitourinary complaints or gastrointestinal complaints. Symptoms are mild in severity. Slightly better if she rubs where the site of pain is. She denies any pain over the greater trochanter   Review Of Systems Outlined In HPI  Past Medical History  Diagnosis Date  . OSA on CPAP 07/27/06  . HSV-2 (herpes simplex virus 2) infection 2009  . Anxiety     intermittent  . Rectal bleeding     with hemorrhoids  . GERD (gastroesophageal reflux disease)   . Fibromyalgia   . Osteoporosis   . blood in stool   . Diverticulosis   . Hemorrhoids   . Glaucoma     Past Surgical History  Procedure Laterality Date  . Hemorrhoids removed     Family History  Problem Relation Age of Onset  . Heart attack Father   . Hyperlipidemia Mother   . Hypertension Mother     Social History   Social History  . Marital Status: Married    Spouse Name: N/A  . Number of Children: N/A  . Years of Education: N/A   Occupational History  . Not on file.   Social History Main Topics  . Smoking status: Former Smoker    Quit date: 03/07/1982  . Smokeless tobacco: Not on file  . Alcohol Use: 0.0 oz/week    0 Standard drinks or equivalent per week     Comment: 2 drinks per month  . Drug Use: No  .  Sexual Activity: No   Other Topics Concern  . Not on file   Social History Narrative   Tobacco use cigarettes: former smoker, tobacco history last updated 10/28/2013. No smoking. No tobacco exposure, no alcohol. No caffeine. No recreational drug use. Exercise: yes, some walking-not much. 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 Marital status: married.     Objective: BP 136/74 mmHg  Pulse 87  Wt 162 lb (73.483 kg)  Vital signs reviewed. General: Alert and Oriented, No Acute Distress HEENT: Pupils equal, round, reactive to light. Conjunctivae clear.  External ears unremarkable.  Moist mucous membranes. Lungs: Clear and comfortable work of breathing, speaking in full sentences without accessory muscle use. Cardiac: Regular rate and rhythm.  Neuro: CN II-XII grossly intact, gait normal. Extremities: No peripheral edema.  Strong peripheral pulses. No pain with internal rotation or external rotation of the right femur. No pain with palpation of the right greater trochanter, normal gait. Mental Status: No depression, anxiety, nor agitation. Logical though process. Skin: Warm and dry.  Assessment & Plan: [name] [name] was seen today for medication refill and hip pain.  Diagnoses and all orders for this visit:  Right hip pain -     DULoxetine (CYMBALTA) 30 MG capsule; Take 1 capsule by mouth  daily -  DG HIP UNILAT WITH PELVIS 2-3 VIEWS RIGHT  Fibromyalgia   Right hip pain: Obtain plain films to look and the possibility of osteoarthritis Fibromyalgia: Controlled with Cymbalta    Return if symptoms worsen or fail to improve.

## 2015-07-27 ENCOUNTER — Encounter: Payer: Self-pay | Admitting: Family Medicine

## 2015-07-27 ENCOUNTER — Ambulatory Visit (INDEPENDENT_AMBULATORY_CARE_PROVIDER_SITE_OTHER): Payer: Medicare Other | Admitting: Family Medicine

## 2015-07-27 ENCOUNTER — Telehealth: Payer: Self-pay

## 2015-07-27 VITALS — BP 116/76 | HR 86 | Wt 165.0 lb

## 2015-07-27 DIAGNOSIS — M7061 Trochanteric bursitis, right hip: Secondary | ICD-10-CM

## 2015-07-27 DIAGNOSIS — M1611 Unilateral primary osteoarthritis, right hip: Secondary | ICD-10-CM | POA: Insufficient documentation

## 2015-07-27 MED ORDER — HYDROCORTISONE ACETATE 25 MG RE SUPP: 25.0000 mg | Freq: Three times a day (TID) | RECTAL | Status: DC | PRN

## 2015-07-27 NOTE — Patient Instructions (Signed)
Thank you for coming in today. Go to PT.  Return in 4 weeks.   Hip Bursitis Bursitis is a swelling and soreness (inflammation) of a fluid-filled sac (bursa). This sac overlies and protects the joints.  CAUSES   Injury.  Overuse of the muscles surrounding the joint.  Arthritis.  Gout.  Infection.  Cold weather.  Inadequate warm-up and conditioning prior to activities. The cause may not be known.  SYMPTOMS   Mild to severe irritation.  Tenderness and swelling over the outside of the hip.  Pain with motion of the hip.  If the bursa becomes infected, a fever may be present. Redness, tenderness, and warmth will develop over the hip. Symptoms usually lessen in 3 to 4 weeks with treatment, but can come back. TREATMENT If conservative treatment does not work, your caregiver may advise draining the bursa and injecting cortisone into the area. This may speed up the healing process. This may also be used as an initial treatment of choice. HOME CARE INSTRUCTIONS   Apply ice to the affected area for 15-20 minutes every 3 to 4 hours while awake for the first 2 days. Put the ice in a plastic bag and place a towel between the bag of ice and your skin.  Rest the painful joint as much as possible, but continue to put the joint through a normal range of motion at least 4 times per day. When the pain lessens, begin normal, slow movements and usual activities to help prevent stiffness of the hip.  Only take over-the-counter or prescription medicines for pain, discomfort, or fever as directed by your caregiver.  Use crutches to limit weight bearing on the hip joint, if advised.  Elevate your painful hip to reduce swelling. Use pillows for propping and cushioning your legs and hips.  Gentle massage may provide comfort and decrease swelling. SEEK IMMEDIATE MEDICAL CARE IF:   Your pain increases even during treatment, or you are not improving.  You have a fever.  You have heat and  inflammation over the involved bursa.  You have any other questions or concerns. MAKE SURE YOU:   Understand these instructions.  Will watch your condition.  Will get help right away if you are not doing well or get worse.   This information is not intended to replace advice given to you by your health care provider. Make sure you discuss any questions you have with your health care provider.   Document Released: 10/15/2001 Document Revised: 07/18/2011 Document Reviewed: 11/25/2014 Elsevier Interactive Patient Education [employer]2016 Elsevier Inc.

## 2015-07-27 NOTE — Telephone Encounter (Signed)
Pt is requesting a refill of hydrocortisone (ANUSOL-HC) 25 MG suppository. This is a historical medication. If appropriate please send to [facility] per patient request. Thanks.

## 2015-07-27 NOTE — Progress Notes (Signed)
   Subjective:    I'm seeing this patient as a consultation for:  Dr. Ivan AnchorsHommel  CC: Hip pain  HPI: Patient itchy years of the lateral and anterior and posterior hip pain. Patient denies any specific injury. She's been evaluated by her PCP who suspects DJD. She notes the pain is predominantly located the lateral hip and is worse when she lies on that side arises from a seated position. She's done exercises the past that have helped but has never had full evaluation for this problem yet. She takes over-the-counter medicines occasionally for pain. This helped some.  Past medical history, Surgical history, Family history not pertinant except as noted below, Social history, Allergies, and medications have been entered into the medical record, reviewed, and no changes needed.   Review of Systems: No headache, visual changes, nausea, vomiting, diarrhea, constipation, dizziness, abdominal pain, skin rash, fevers, chills, night sweats, weight loss, swollen lymph nodes, body aches, joint swelling, muscle aches, chest pain, shortness of breath, mood changes, visual or auditory hallucinations.   Objective:    Filed Vitals:   07/27/15 1040  BP: 116/76  Pulse: 86   General: Well Developed, well nourished, and in no acute distress.  Neuro/Psych: Alert and oriented x3, extra-ocular muscles intact, able to move all 4 extremities, sensation grossly intact. Skin: Warm and dry, no rashes noted.  Respiratory: Not using accessory muscles, speaking in full sentences, trachea midline.  Cardiovascular: Pulses palpable, no extremity edema. Abdomen: Does not appear distended. MSK: Back nontender to midline normal back motion Hip tender to palpation overlying right greater trochanter. Normal hip motion without significant impingement or pain. Positive crossover stretch pain. Antalgic gait.  X-ray right hip needed 07/22/2015 reviewed  No results found for this or any previous visit (from the past 24 hour(s)). No  results found.  Impression and Recommendations:   This case required medical decision making of moderate complexity.

## 2015-07-27 NOTE — Assessment & Plan Note (Signed)
Discussed options. Patient declines steroid injection. Plan for physical therapy including iontophoresis. Recheck in one month

## 2015-07-27 NOTE — Telephone Encounter (Signed)
Rx sent to [facility]

## 2015-08-05 ENCOUNTER — Ambulatory Visit (INDEPENDENT_AMBULATORY_CARE_PROVIDER_SITE_OTHER): Payer: Medicare Other | Admitting: Rehabilitative and Restorative Service Providers"

## 2015-08-05 ENCOUNTER — Encounter: Payer: Self-pay | Admitting: Rehabilitative and Restorative Service Providers"

## 2015-08-05 DIAGNOSIS — R29898 Other symptoms and signs involving the musculoskeletal system: Secondary | ICD-10-CM

## 2015-08-05 DIAGNOSIS — M25551 Pain in right hip: Secondary | ICD-10-CM

## 2015-08-05 DIAGNOSIS — R531 Weakness: Secondary | ICD-10-CM

## 2015-08-05 DIAGNOSIS — R6889 Other general symptoms and signs: Secondary | ICD-10-CM

## 2015-08-05 DIAGNOSIS — M623 Immobility syndrome (paraplegic): Secondary | ICD-10-CM

## 2015-08-05 DIAGNOSIS — Z7409 Other reduced mobility: Secondary | ICD-10-CM

## 2015-08-05 DIAGNOSIS — M2560 Stiffness of unspecified joint, not elsewhere classified: Secondary | ICD-10-CM

## 2015-08-05 NOTE — Therapy (Signed)
[facility] [place] [address] [place], [place], [address] Phone: [phone]   Fax:  [phone]  Physical Therapy Evaluation  Patient Details  Name: [name] [name] MRN: [id number] Date of Birth: [birth year] Referring Provider: Dr. Denyse Amass   Encounter Date: 08/05/2015      PT End of Session - 08/05/15 1129    Visit Number 1   Number of Visits 12   Date for PT Re-Evaluation 09/16/15   PT Start Time 1017   PT Stop Time 1121   PT Time Calculation (min) 64 min   Activity Tolerance Patient tolerated treatment well      Past Medical History  Diagnosis Date  . OSA on CPAP 07/27/06  . HSV-2 (herpes simplex virus 2) infection 2009  . Anxiety     intermittent  . Rectal bleeding     with hemorrhoids  . GERD (gastroesophageal reflux disease)   . Fibromyalgia   . Osteoporosis   . blood in stool   . Diverticulosis   . Hemorrhoids   . Glaucoma     Past Surgical History  Procedure Laterality Date  . Hemorrhoids removed      There were no vitals filed for this visit.  Visit Diagnosis:  Hip pain, right - Plan: PT plan of care cert/re-cert  Stiffness due to immobility - Plan: PT plan of care cert/re-cert  Weakness of both lower extremities - Plan: PT plan of care cert/re-cert  Decreased strength, endurance, and mobility - Plan: PT plan of care cert/re-cert      Subjective Assessment - 08/05/15 1025    Subjective Patient reports pain in the Rt hip on and off with certain movements or positions which has been present for the past 1-2 months. She was referred to PT to address pain and tightness.    Pertinent History History of Rt hip pain on and off for 30 years which has been treated with meds and PT in the past; fibromyalgia; neck and shoulder pain on an intermittent basis; osteoporosis    How long can you sit comfortably? 1 hour   How long can you stand comfortably? 15-20 min    How long can you walk comfortably? not  at all    Diagnostic tests xrays    Patient Stated Goals learn exercises; get into a routine of exercse; improve balance    Currently in Pain? Yes   Pain Score 2    Pain Location Hip   Pain Orientation Right   Pain Descriptors / Indicators Aching;Nagging   Pain Type Chronic pain   Pain Radiating Towards up into the back and down into the posterior thigh    Aggravating Factors  certain movements; some days are worse than others    Pain Relieving Factors tylenol; changing positions            [facility] PT Assessment - 08/05/15 0001    Assessment   Medical Diagnosis Rt trochanteric bursitis   Referring Provider Dr. Denyse Amass    Onset Date/Surgical Date 06/10/15  flare up    Hand Dominance Right   Next MD Visit 4/17   Prior Therapy John O'Hallaran ~2 yrs ago for ~1 year treating hip/nec/shoulders    Precautions   Precautions None   Balance Screen   Has the patient fallen in the past 6 months No   Has the patient had a decrease in activity level because of a fear of falling?  No   Is the patient reluctant to leave their  home because of a fear of falling?  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    Observation/Other Assessments   Focus on Therapeutic Outcomes (FOTO)  46% limitation    Sensation   Additional Comments WFL's per pt report    Posture/Postural Control   Posture Comments head forward; shoulders rounded and elevater; significant increase in thoracic kyphosis; flexed forward at hips    AROM   Right Hip Extension 5   Right Hip Flexion 114   Left Hip Extension 10   Left Hip Flexion 121   Lumbar Flexion 85%   Lumbar Extension 10%   Lumbar - Right Side Bend 65%   Lumbar - Left Side Bend 60%   Lumbar - Right Rotation 25%   Lumbar -  Left Rotation 20%   Strength   Right Hip Flexion 4/5   Right Hip Extension 4-/5   Right Hip ABduction --  5-/5   Right Hip ADduction 5/5   Left Hip Flexion 4+/5   Left Hip Extension 4-/5   Left Hip ABduction --  5-/5   Left Hip ADduction 5/5   Right/Left Knee --  5/5 bilat knee flex/ext    Right Ankle Dorsiflexion --  5-/5   Right Ankle Plantar Flexion 3+/5   Right Ankle Inversion 5/5   Right Ankle Eversion 4+/5   Left Ankle Dorsiflexion --  5-/5   Left Ankle Plantar Flexion 3+/5   Left Ankle Inversion 5/5   Left Ankle Eversion --  5-/5   Palpation   Spinal mobility Pain and tightness with CPA/UPA L4/5/S1    Palpation comment significant tightness through the hip flexors; QL; piriformis; hip abductors bila Rt >> Lt tenderness through greater trochanter Rt > Lt                    [facility] Adult PT Treatment/Exercise - 08/05/15 0001    Therapeutic Activites    Therapeutic Activities --  myofacial ball release work Rt hip    Knee/Hip Exercises: Community education officertretches   Passive Hamstring Stretch 2 reps;30 seconds   Quad Stretch 2 reps;30 seconds   Hip Flexor Stretch 2 reps;30 seconds   ITB Stretch 2 reps;30 seconds   Piriformis Stretch 3 reps;30 seconds   Moist Heat Therapy   Number Minutes Moist Heat 20 Minutes   Moist Heat Location Hip  anterior Rt/posterior Rt hip and bilat LB   Electrical Stimulation   Electrical Stimulation Location posterior Rt hip sacrum to greater trochanter   Electrical Stimulation Action IFC   Electrical Stimulation Parameters to tolerance    Electrical Stimulation Goals Pain;Tone                PT Education - 08/05/15 1105    Education provided Yes   Education Details HEp; TENS unit info   Person(s) Educated Patient   Methods Explanation;Demonstration;Tactile cues;Verbal cues;Handout   Comprehension Verbalized understanding;Returned demonstration;Verbal cues required;Tactile cues required             PT Long Term Goals -  08/05/15 1146    PT LONG TERM GOAL #1   Title Increase mobilty and ROM through Rt hip to equal or > than Lt hp 09/16/15   Time 6   Period Weeks  Status New   PT LONG TERM GOAL #2   Title 4+/5 to 5/5 strength bilat LE's 09/16/15   Time 6   Period Weeks   Status New   PT LONG TERM GOAL #3   Title Improve walking and exercise tolerance with no more than 0/10 to 2/10 pain level for 15-20 min 09/16/15    Time 6   Period Weeks   Status New   PT LONG TERM GOAL #4   Title I in HEP 09/16/15   Time 6   Period Weeks   Status New   PT LONG TERM GOAL #5   Title Improve FOTO to </= 40% limitation 09/16/15   Time 6   Period Weeks   Status New               Plan - 08/05/15 1130    Clinical Impression Statement Patient presents with Rt hip pain which has gradually increased in the past 1-2 months. She has poor posture and alignment; limited hip and trunk mobility and ROM; muscular tightness to palpation; weakness; limtied endurance and functional activity level and well as pain on a daily basis. pt will benefit form PT to address problems identified.    Pt will benefit from skilled therapeutic intervention in order to improve on the following deficits Postural dysfunction;Improper body mechanics;Pain;Increased fascial restricitons;Increased muscle spasms;Decreased range of motion;Decreased mobility;Decreased strength;Decreased endurance;Decreased activity tolerance   Rehab Potential Good   PT Frequency 2x / week   PT Duration 6 weeks   PT Treatment/Interventions Patient/family education;ADLs/Self Care Home Management;Neuromuscular re-education;Therapeutic exercise;Therapeutic activities;Cryotherapy;Electrical Stimulation;Iontophoresis /ml Dexamethasone;Moist Heat;Ultrasound;Manual techniques;Dry needling   PT Next Visit Plan postural correction; manual work; TDN; strengthening; further assess balance as needed(very tight piriformis/glut med Rt > Lt)   PT Home Exercise Plan HEP; myofacial  ball release work; IT trainer and Agree with Plan of Care Patient         Problem List Patient Active Problem List   Diagnosis Date Noted  . Trochanteric bursitis of right hip 07/27/2015  . ASCVD (arteriosclerotic cardiovascular disease) 02/05/2015  . IBS (irritable bowel syndrome) 02/05/2015  . Rectal bleeding 02/05/2015  . Pulmonary nodules 02/05/2015  . Glaucoma 01/06/2015  . Fibromyalgia 01/06/2015  . Hyperlipidemia 01/06/2015  . Obstructive sleep apnea on CPAP 01/06/2015  . Osteoporosis 01/06/2015    Reality Dejonge Rober Minion PT, MPH  08/05/2015, 11:53 AM  [facility] [address] [place], [place], [address] Phone: [phone]   Fax:  [phone]  Name: [name] [name] MRN: [id number] Date of Birth: 08/09/1940

## 2015-08-05 NOTE — Patient Instructions (Signed)
HIP: Hamstrings - Supine   Place strap around foot. Raise leg up, keeping knee straight.  Bend opposite knee to protect back if indicated. Hold 30 seconds. 3 reps per set, 2-3 sets per day     Outer Hip Stretch: Reclined IT Band Stretch (Strap)   Strap around one foot, pull leg across body until you feel a pull or stretch, with shoulders on mat. Hold for 30 seconds. Repeat 3 times each leg. 2-3 times/day.  Piriformis Stretch   Lying on back, pull right knee toward opposite shoulder. Hold 30 seconds. Repeat 3 times. Do 2-3 sessions per day.   Quads / HF, Supine   Lie near edge of bed, pull both knees up toward chest. Hold one knee as you drop the other leg off the edge of the bed.  Relax hanging knee/can bend knee back if indicated. Hold 30 seconds. Repeat 3 times per session. Do 2-3 sessions per day.    Quads / HF, Prone   Lie face down. Grasp one ankle with same-side hand. Use towel if needed to reach. Gently pull foot toward buttock.  Hold 30 seconds. Repeat 3 times per session. Do 2-3 sessions per day.  TENS UNIT: This is helpful for muscle pain and spasm.   Search and Purchase a TENS 7000 2nd edition at www.tenspros.com. It should be less than $30.     TENS unit instructions: Do not shower or bathe with the unit on Turn the unit off before removing electrodes or batteries If the electrodes lose stickiness add a drop of water to the electrodes after they are disconnected from the unit and place on plastic sheet. If you continued to have difficulty, call the TENS unit company to purchase more electrodes. Do not apply lotion on the skin area prior to use. Make sure the skin is clean and dry as this will help prolong the life of the electrodes. After use, always check skin for unusual red areas, rash or other skin difficulties. If there are any skin problems, does not apply electrodes to the same area. Never remove the electrodes from the unit by pulling the  wires. Do not use the TENS unit or electrodes other than as directed. Do not change electrode placement without consultating your therapist or physician. Keep 2 fingers with between each electrode.    

## 2015-08-10 ENCOUNTER — Ambulatory Visit (INDEPENDENT_AMBULATORY_CARE_PROVIDER_SITE_OTHER): Payer: Medicare Other | Admitting: Physical Therapy

## 2015-08-10 ENCOUNTER — Encounter: Payer: Self-pay | Admitting: Physical Therapy

## 2015-08-10 DIAGNOSIS — R29898 Other symptoms and signs involving the musculoskeletal system: Secondary | ICD-10-CM

## 2015-08-10 DIAGNOSIS — M623 Immobility syndrome (paraplegic): Secondary | ICD-10-CM | POA: Diagnosis not present

## 2015-08-10 DIAGNOSIS — Z7409 Other reduced mobility: Secondary | ICD-10-CM | POA: Diagnosis not present

## 2015-08-10 DIAGNOSIS — M25551 Pain in right hip: Secondary | ICD-10-CM | POA: Diagnosis not present

## 2015-08-10 DIAGNOSIS — R531 Weakness: Secondary | ICD-10-CM

## 2015-08-10 DIAGNOSIS — M2560 Stiffness of unspecified joint, not elsewhere classified: Secondary | ICD-10-CM

## 2015-08-10 DIAGNOSIS — R6889 Other general symptoms and signs: Secondary | ICD-10-CM

## 2015-08-10 NOTE — Therapy (Signed)
[facility] [place], [place], [address] Phone: [phone]   Fax:  [phone]  Physical Therapy Treatment  Patient Details  Name: [name] [name] MRN: [id number] Date of Birth: 03-Oct-1940 Referring Provider: Dr. Georgina Snell   Encounter Date: 08/10/2015      PT End of Session - 08/10/15 1403    Visit Number 2   Number of Visits 12   Date for PT Re-Evaluation 09/16/15   PT Start Time 2774   PT Stop Time 1504   PT Time Calculation (min) 61 min      Past Medical History  Diagnosis Date  . OSA on CPAP 07/27/06  . HSV-2 (herpes simplex virus 2) infection 2009  . Anxiety     intermittent  . Rectal bleeding     with hemorrhoids  . GERD (gastroesophageal reflux disease)   . Fibromyalgia   . Osteoporosis   . blood in stool   . Diverticulosis   . Hemorrhoids   . Glaucoma     Past Surgical History  Procedure Laterality Date  . Hemorrhoids removed      There were no vitals filed for this visit.  Visit Diagnosis:  Hip pain, right  Stiffness due to immobility  Weakness of both lower extremities  Decreased strength, endurance, and mobility      Subjective Assessment - 08/10/15 1403    Subjective Daysia said she is doing OK, able to do the HEP, a little better with them. No questions about the stretches. Only having mild pain now at times.    Patient Stated Goals learn exercises; get into a routine of exercse; improve balance    Currently in Pain? No/denies                         [facility] Adult PT Treatment/Exercise - 08/10/15 0001    Knee/Hip Exercises: Stretches   Other Knee/Hip Stretches hip rotation stretches in hooklying/piriformis position.    Other Knee/Hip Stretches neck stretches in seated position.    Knee/Hip Exercises: Aerobic   Nustep L4x5'   Knee/Hip Exercises: Supine   Other Supine Knee/Hip Exercises 10 reps thoracic lifts, leg lengtheners and leg presses.   Knee/Hip Exercises: Sidelying   Clams 2x10 reps each side.    Modalities   Modalities Electrical Stimulation;Moist Heat   Moist Heat Therapy   Number Minutes Moist Heat 20 Minutes   Moist Heat Location Hip  Rt and bilat lumbar   Electrical Stimulation   Electrical Stimulation Location posterior Rt hip sacrum to greater trochanter   Electrical Stimulation Action IFC    Electrical Stimulation Parameters to tolerance   Electrical Stimulation Goals Pain;Tone                     PT Long Term Goals - 08/10/15 1412    PT LONG TERM GOAL #1   Title Increase mobilty and ROM through Rt hip to equal or > than Lt hp 09/16/15   Time 6   Period Weeks   Status On-going   PT LONG TERM GOAL #2   Title 4+/5 to 5/5 strength bilat LE's 09/16/15   Time 6   Period Weeks   Status On-going   PT LONG TERM GOAL #3   Title Improve walking and exercise tolerance with no more than 0/10 to 2/10 pain level for 15-20 min 09/16/15    Time 6   Period Weeks   Status On-going  PT LONG TERM GOAL #4   Title I in HEP 09/16/15   Time 6   Period Weeks   Status On-going   PT LONG TERM GOAL #5   Title Improve FOTO to </= 40% limitation 09/16/15   Time 6   Period Weeks   Status On-going               Plan - 08/10/15 1415    Clinical Impression Statement this is only [name] [name]'s second visit, no goals met however she does feel like she is a little better.     Pt will benefit from skilled therapeutic intervention in order to improve on the following deficits Postural dysfunction;Improper body mechanics;Pain;Increased fascial restricitons;Increased muscle spasms;Decreased range of motion;Decreased mobility;Decreased strength;Decreased endurance;Decreased activity tolerance   Rehab Potential Good   PT Frequency 2x / week   PT Duration 6 weeks   PT Treatment/Interventions Patient/family education;ADLs/Self Care Home Management;Neuromuscular re-education;Therapeutic exercise;Therapeutic  activities;Cryotherapy;Electrical Stimulation;Iontophoresis 85m/ml Dexamethasone;Moist Heat;Ultrasound;Manual techniques;Dry needling   PT Next Visit Plan progress HEP , postural correction; manual work; TDN; strengthening; further assess balance as needed(very tight piriformis/glut med Rt > Lt)   Consulted and Agree with Plan of Care Patient        Problem List Patient Active Problem List   Diagnosis Date Noted  . Trochanteric bursitis of right hip 07/27/2015  . ASCVD (arteriosclerotic cardiovascular disease) 02/05/2015  . IBS (irritable bowel syndrome) 02/05/2015  . Rectal bleeding 02/05/2015  . Pulmonary nodules 02/05/2015  . Glaucoma 01/06/2015  . Fibromyalgia 01/06/2015  . Hyperlipidemia 01/06/2015  . Obstructive sleep apnea on CPAP 01/06/2015  . Osteoporosis 01/06/2015    SJeral PinchPT 08/10/2015, 2:51 PM  [facility] [address]Phone: [phone]  Fax:  [phone] Name: [name] LAMPRECHTMRN: [id number] of Birth: 1October 21, 1942

## 2015-08-12 ENCOUNTER — Encounter: Payer: Self-pay | Admitting: Rehabilitative and Restorative Service Providers"

## 2015-08-12 ENCOUNTER — Ambulatory Visit (INDEPENDENT_AMBULATORY_CARE_PROVIDER_SITE_OTHER): Payer: Medicare Other | Admitting: Rehabilitative and Restorative Service Providers"

## 2015-08-12 DIAGNOSIS — M2560 Stiffness of unspecified joint, not elsewhere classified: Secondary | ICD-10-CM

## 2015-08-12 DIAGNOSIS — M623 Immobility syndrome (paraplegic): Secondary | ICD-10-CM | POA: Diagnosis not present

## 2015-08-12 DIAGNOSIS — R29898 Other symptoms and signs involving the musculoskeletal system: Secondary | ICD-10-CM

## 2015-08-12 DIAGNOSIS — M25551 Pain in right hip: Secondary | ICD-10-CM | POA: Diagnosis not present

## 2015-08-12 DIAGNOSIS — Z7409 Other reduced mobility: Secondary | ICD-10-CM

## 2015-08-12 DIAGNOSIS — R531 Weakness: Secondary | ICD-10-CM

## 2015-08-12 NOTE — Therapy (Signed)
[facility] [place] [address] [place], [place], [address] Phone: [phone]   Fax:  [phone]  Physical Therapy Treatment  Patient Details  Name: [name] CopasYvonne R Chance MRN: [id number] Date of Birth: 01/04/1941 Referring Provider: Dr. Denyse Amassorey  Encounter Date: 08/12/2015      PT End of Session - 08/12/15 0939    Visit Number 3   Number of Visits 12   Date for PT Re-Evaluation 09/16/15   PT Start Time 0934   PT Stop Time 1028   PT Time Calculation (min) 54 min   Activity Tolerance Patient tolerated treatment well      Past Medical History  Diagnosis Date  . OSA on CPAP 07/27/06  . HSV-2 (herpes simplex virus 2) infection 2009  . Anxiety     intermittent  . Rectal bleeding     with hemorrhoids  . GERD (gastroesophageal reflux disease)   . Fibromyalgia   . Osteoporosis   . blood in stool   . Diverticulosis   . Hemorrhoids   . Glaucoma     Past Surgical History  Procedure Laterality Date  . Hemorrhoids removed      There were no vitals filed for this visit.  Visit Diagnosis:  Hip pain, right  Stiffness due to immobility  Weakness of both lower extremities  Decreased strength, endurance, and mobility      Subjective Assessment - 08/12/15 0940    Subjective Hip continues to improve. She is doing her HEP. Having more stiffness and tightness in general in the past couple of days. Maybe storms and weather fronts coming through the area. Not sure.    Currently in Pain? Yes   Pain Score 1    Pain Location Hip   Pain Orientation Right   Pain Descriptors / Indicators Aching;Nagging   Pain Type Chronic pain            [facility] PT Assessment - 08/12/15 0001    Assessment   Medical Diagnosis Rt trochanteric bursitis   Referring Provider Dr. Denyse Amassorey   Onset Date/Surgical Date 06/10/15  flare up    Hand Dominance Right   Next MD Visit 4/17   Prior Therapy John O'Hallaran ~2 yrs ago for ~1 year treating  hip/nec/shoulders    Precautions   Precautions None   Palpation   Spinal mobility Pain and tightness with CPA/UPA L4/5/S1    Palpation comment continued tightness through the hip flexors; QL; piriformis; hip abductors bila Rt >> Lt tenderness through greater trochanter Rt > Lt                      [facility] Adult PT Treatment/Exercise - 08/12/15 0001    Knee/Hip Exercises: Stretches   Passive Hamstring Stretch 2 reps;30 seconds   Quad Stretch 2 reps;30 seconds   Hip Flexor Stretch 2 reps;30 seconds   ITB Stretch 2 reps;30 seconds   Piriformis Stretch 3 reps;30 seconds   Other Knee/Hip Stretches hip rotation stretches in prone - PT assist     Modalities   Modalities Electrical Stimulation;Moist Heat   Moist Heat Therapy   Number Minutes Moist Heat 15 Minutes   Moist Heat Location Hip  Rt and bilat lumbar   Electrical Stimulation   Electrical Stimulation Location posterior Rt hip sacrum to greater trochanter   Electrical Stimulation Action IFC   Electrical Stimulation Parameters to tolerance   Electrical Stimulation Goals Pain;Tone   Manual Therapy   Manual therapy comments pt prone  Joint Mobilization PA mobs Rt greater trochanter    Soft tissue mobilization Rt lumbar paraspinals; piriformis; hip abductors   Myofascial Release Rt hip - LB    Passive ROM Rt hip rotation w/ patient in prone position                 PT Education - 08/12/15 1006    Education provided Yes   Education Details HEP   Person(s) Educated Patient   Methods Explanation;Demonstration;Tactile cues;Verbal cues;Handout   Comprehension Verbalized understanding;Returned demonstration;Verbal cues required;Tactile cues required             PT Long Term Goals - 08/10/15 1412    PT LONG TERM GOAL #1   Title Increase mobilty and ROM through Rt hip to equal or > than Lt hp 09/16/15   Time 6   Period Weeks   Status On-going   PT LONG TERM GOAL #2   Title 4+/5 to 5/5 strength bilat  LE's 09/16/15   Time 6   Period Weeks   Status On-going   PT LONG TERM GOAL #3   Title Improve walking and exercise tolerance with no more than 0/10 to 2/10 pain level for 15-20 min 09/16/15    Time 6   Period Weeks   Status On-going   PT LONG TERM GOAL #4   Title I in HEP 09/16/15   Time 6   Period Weeks   Status On-going   PT LONG TERM GOAL #5   Title Improve FOTO to </= 40% limitation 09/16/15   Time 6   Period Weeks   Status On-going               Plan - 08/12/15 1022    Clinical Impression Statement Decreased pain in Rt hip with HEP and PT. She has less tenderness and tightness to palpation through LB and hip. Responded well to manual work today. Progressing toward stated goals of therapy.    Pt will benefit from skilled therapeutic intervention in order to improve on the following deficits Postural dysfunction;Improper body mechanics;Pain;Increased fascial restricitons;Increased muscle spasms;Decreased range of motion;Decreased mobility;Decreased strength;Decreased endurance;Decreased activity tolerance   Rehab Potential Good   PT Frequency 2x / week   PT Duration 6 weeks   PT Treatment/Interventions Patient/family education;ADLs/Self Care Home Management;Neuromuscular re-education;Therapeutic exercise;Therapeutic activities;Cryotherapy;Electrical Stimulation;Iontophoresis /ml Dexamethasone;Moist Heat;Ultrasound;Manual techniques;Dry needling   PT Next Visit Plan progress HEP , postural correction; manual work; TDN; strengthening; further assess balance as needed(very tight piriformis/glut med Rt > Lt)   PT Home Exercise Plan HEP; myofacial ball release work; IT trainer and Agree with Plan of Care Patient        Problem List Patient Active Problem List   Diagnosis Date Noted  . Trochanteric bursitis of right hip 07/27/2015  . ASCVD (arteriosclerotic cardiovascular disease) 02/05/2015  . IBS (irritable bowel syndrome) 02/05/2015  . Rectal bleeding  02/05/2015  . Pulmonary nodules 02/05/2015  . Glaucoma 01/06/2015  . Fibromyalgia 01/06/2015  . Hyperlipidemia 01/06/2015  . Obstructive sleep apnea on CPAP 01/06/2015  . Osteoporosis 01/06/2015    Rhyder Koegel Rober Minion PT, MPH  08/12/2015, 10:27 AM  [facility] [address] [place], [place], [address] Phone: [phone]   Fax:  [phone]  Name: WHITTNEY STEENSON MRN: [id number] Date of Birth: Oct 26, 1940

## 2015-08-12 NOTE — Patient Instructions (Signed)
Abdominal Bracing With Pelvic Floor (Hook-Lying)    With neutral spine, tighten pelvic floor and abdominals sucking belly button to back bone; tighten muscles in low back at waist. Hold 10 sec Repeat _10__ times. Do _several __ times a day. Progress to do in sitting; standing; and walking as well as with functional activities    Gluteal Sets    Tighten buttocks while pressing pelvis to floor. Hold __10__ seconds. Repeat _10___ times per set. Do _1-2___ sets per session. Do _1-2__ sessions per day.

## 2015-08-17 ENCOUNTER — Ambulatory Visit (INDEPENDENT_AMBULATORY_CARE_PROVIDER_SITE_OTHER): Payer: Medicare Other | Admitting: Rehabilitative and Restorative Service Providers"

## 2015-08-17 ENCOUNTER — Encounter: Payer: Self-pay | Admitting: Rehabilitative and Restorative Service Providers"

## 2015-08-17 DIAGNOSIS — M791 Myalgia, unspecified site: Secondary | ICD-10-CM

## 2015-08-17 DIAGNOSIS — M25551 Pain in right hip: Secondary | ICD-10-CM

## 2015-08-17 DIAGNOSIS — M6281 Muscle weakness (generalized): Secondary | ICD-10-CM | POA: Diagnosis not present

## 2015-08-17 DIAGNOSIS — M25651 Stiffness of right hip, not elsewhere classified: Secondary | ICD-10-CM

## 2015-08-17 NOTE — Therapy (Signed)
[facility] [address] [place], [place], [address] Phone: [phone]   Fax:  [phone]  Physical Therapy Treatment  Patient Details  Name: SHANICA CASTELLANOS MRN: [id number] Date of Birth: 1940/10/29 Referring Provider: Dr. Denyse Amass  Encounter Date: 08/17/2015      PT End of Session - 08/17/15 1436    Visit Number 4   Number of Visits 12   Date for PT Re-Evaluation 09/16/15   PT Start Time 1435   PT Stop Time 1528   PT Time Calculation (min) 53 min   Activity Tolerance Patient tolerated treatment well      Past Medical History  Diagnosis Date  . OSA on CPAP 07/27/06  . HSV-2 (herpes simplex virus 2) infection 2009  . Anxiety     intermittent  . Rectal bleeding     with hemorrhoids  . GERD (gastroesophageal reflux disease)   . Fibromyalgia   . Osteoporosis   . blood in stool   . Diverticulosis   . Hemorrhoids   . Glaucoma     Past Surgical History  Procedure Laterality Date  . Hemorrhoids removed      There were no vitals filed for this visit.          [facility] PT Assessment - 08/17/15 0001    Assessment   Medical Diagnosis Rt trochanteric bursitis   Referring Provider Dr. Denyse Amass   Onset Date/Surgical Date 06/10/15  flare up    Hand Dominance Right   Next MD Visit 4/17   Prior Therapy [facility] ~2 yrs ago for ~1 year treating hip/nec/shoulders    AROM   Right Hip Flexion 114   Left Hip Flexion 122   Strength   Right Hip Flexion 4+/5   Left Hip Flexion --  5-/5   Palpation   Spinal mobility decreased pain and tightness with CPA/UPA L4/5/S1    Palpation comment tightness through the hip flexors; QL; piriformis; hip abductors bila Rt >> Lt tenderness through greater trochanter Rt > Lt                      [facility] Adult PT Treatment/Exercise - 08/17/15 0001    Knee/Hip Exercises: Stretches   Passive Hamstring Stretch 2 reps;30 seconds   Quad Stretch 2 reps;30 seconds   Hip Flexor Stretch 2 reps;30 seconds   ITB Stretch 2 reps;30 seconds   Piriformis Stretch 3 reps;30 seconds   Other Knee/Hip Stretches hip rotation stretches in prone - PT assist     Knee/Hip Exercises: Aerobic   Nustep L5 x 5    Knee/Hip Exercises: Standing   Heel Raises 10 reps   Lateral Step Up 10 reps  toe touch lateral 6 inch step    Forward Step Up 10 reps  6 inch step    Knee/Hip Exercises: Supine   Bridges Limitations --  bridging x 10 w/ 5 sec hold    Other Supine Knee/Hip Exercises clam w/ red TB x 10   Other Supine Knee/Hip Exercises adductor squeeze with ball x 10   Modalities   Modalities Electrical Stimulation;Moist Heat   Moist Heat Therapy   Number Minutes Moist Heat 20 Minutes   Moist Heat Location Hip  Rt and bilat lumbar   Electrical Stimulation   Electrical Stimulation Location posterior Rt hip sacrum to greater trochanter   Electrical Stimulation Action IFC   Electrical Stimulation Parameters to tolerance   Electrical Stimulation Goals Pain;Tone   Manual Therapy  Manual therapy comments pt prone    Joint Mobilization PA mobs Rt greater trochanter    Soft tissue mobilization Rt lumbar paraspinals; piriformis; hip abductors   Myofascial Release Rt hip - LB    Passive ROM Rt hip rotation w/ patient in prone position                 PT Education - 08/17/15 1537    Education provided Yes   Education Details HEP    Person(s) Educated Patient   Methods Explanation;Demonstration;Tactile cues;Verbal cues;Handout   Comprehension Verbalized understanding;Returned demonstration;Verbal cues required;Tactile cues required             PT Long Term Goals - 08/17/15 1503    PT LONG TERM GOAL #1   Title Increase mobilty and ROM through Rt hip to equal or > than Lt hp 09/16/15   Time 6   Period Weeks   Status On-going   PT LONG TERM GOAL #2   Title 4+/5 to 5/5 strength bilat LE's 09/16/15   Time 6   Period Weeks   Status On-going   PT LONG TERM  GOAL #3   Title Improve walking and exercise tolerance with no more than 0/10 to 2/10 pain level for 15-20 min 09/16/15    Time 6   Period Weeks   Status On-going   PT LONG TERM GOAL #5   Title Improve FOTO to </= 40% limitation 09/16/15   Time 6   Period Weeks   Status On-going               Plan - 08/17/15 1501    Clinical Impression Statement Continued improvement in pain. Noted gains in mobilty and strength. Pt brought her TENS unit in for assistance with set up and instruction in use which were both accomplished today. patient is progressing wll toward stated goals of therapy.    Rehab Potential Good   PT Frequency 2x / week   PT Duration 6 weeks   PT Treatment/Interventions Patient/family education;ADLs/Self Care Home Management;Neuromuscular re-education;Therapeutic exercise;Therapeutic activities;Cryotherapy;Electrical Stimulation;Iontophoresis /ml Dexamethasone;Moist Heat;Ultrasound;Manual techniques;Dry needling   PT Next Visit Plan progress HEP , postural correction; manual work; TDN; strengthening; further assess balance as needed(very tight piriformis/glut med Rt > Lt)   PT Home Exercise Plan HEP; myofacial ball release work; Engineer, production with Plan of Care Patient      Patient will benefit from skilled therapeutic intervention in order to improve the following deficits and impairments:  Postural dysfunction, Improper body mechanics, Pain, Increased fascial restricitons, Increased muscle spasms, Decreased range of motion, Decreased mobility, Decreased strength, Decreased endurance, Decreased activity tolerance  Visit Diagnosis: Hip pain, right - Plan: PT plan of care cert/re-cert  Stiffness of right hip, not elsewhere classified - Plan: PT plan of care cert/re-cert  Muscle weakness (generalized) - Plan: PT plan of care cert/re-cert  Myalgia - Plan: PT plan of care cert/re-cert     Problem List Patient Active Problem List   Diagnosis Date  Noted  . Trochanteric bursitis of right hip 07/27/2015  . ASCVD (arteriosclerotic cardiovascular disease) 02/05/2015  . IBS (irritable bowel syndrome) 02/05/2015  . Rectal bleeding 02/05/2015  . Pulmonary nodules 02/05/2015  . Glaucoma 01/06/2015  . Fibromyalgia 01/06/2015  . Hyperlipidemia 01/06/2015  . Obstructive sleep apnea on CPAP 01/06/2015  . Osteoporosis 01/06/2015    Denora Wysocki Rober Minion PT, MPH  08/17/2015, 3:46 PM  [facility] [address]  [place], [place], [address] Phone: [phone]   Fax:  [phone]  Name: Irving CopasYvonne R Patchin MRN: [id number] Date of Birth: 08/17/1940

## 2015-08-17 NOTE — Patient Instructions (Signed)
Bridging    Slowly raise buttocks from floor, keeping stomach tight. Repeat __10__ times per set. Do __1-3__ sets per session. Do _1__ sessions per day.   Strengthening: Hip Adduction - Isometric    With ball or folded pillow between knees, squeeze knees together. Hold _5-10___ seconds. Repeat __10__ times per set. Do __1-3__ sets per session. Do __1__ sessions per day.   Strengthening: Hip Abduction - Isometric    Using ball or folded pillow, push outside of right knee into wall. Can use the theraband around both legs for resistance Hold __5-10__ seconds. Repeat __10__ times per set. Do _1-3___ sets per session. Do __1__ sessions per day.   Forward    Facing step, place one leg on step, flexed at hip. Step up slowly, bringing hips in line with knee and shoulder. Bring other foot onto step. Reverse process to step back down. Repeat with other leg. Do _10___ repetitions, ___1-3_ sets.   Lateral Step Up    Stand to side of step. Step up leading with left leg. Slowly step down leading with opposite leg without shifting weight to the leg that is down then push back up Perform _10__ reps.

## 2015-08-19 ENCOUNTER — Ambulatory Visit (INDEPENDENT_AMBULATORY_CARE_PROVIDER_SITE_OTHER): Payer: Medicare Other | Admitting: Rehabilitative and Restorative Service Providers"

## 2015-08-19 ENCOUNTER — Encounter: Payer: Self-pay | Admitting: Rehabilitative and Restorative Service Providers"

## 2015-08-19 DIAGNOSIS — M791 Myalgia, unspecified site: Secondary | ICD-10-CM

## 2015-08-19 DIAGNOSIS — M25551 Pain in right hip: Secondary | ICD-10-CM

## 2015-08-19 DIAGNOSIS — M6281 Muscle weakness (generalized): Secondary | ICD-10-CM

## 2015-08-19 DIAGNOSIS — M25651 Stiffness of right hip, not elsewhere classified: Secondary | ICD-10-CM

## 2015-08-19 NOTE — Therapy (Signed)
[place], [place], [address] Phone: [phone]   Fax:  [phone]  Physical Therapy Treatment  Patient Details  Name: [name] [name] MRN: [id number] Date of Birth: 1940-08-23 Referring Provider: dr. Georgina Snell  Encounter Date: 08/19/2015      PT End of Session - 08/19/15 0937    Visit Number 5   Number of Visits 12   Date for PT Re-Evaluation 09/16/15   PT Start Time 0938   PT Stop Time 1030   PT Time Calculation (min) 52 min   Activity Tolerance Patient tolerated treatment well      Past Medical History  Diagnosis Date  . OSA on CPAP 07/27/06  . HSV-2 (herpes simplex virus 2) infection 2009  . Anxiety     intermittent  . Rectal bleeding     with hemorrhoids  . GERD (gastroesophageal reflux disease)   . Fibromyalgia   . Osteoporosis   . blood in stool   . Diverticulosis   . Hemorrhoids   . Glaucoma     Past Surgical History  Procedure Laterality Date  . Hemorrhoids removed      There were no vitals filed for this visit.      Subjective Assessment - 08/19/15 0940    Subjective She is using the TENS unit at home which is working well. Back and hip are improving.    Currently in Pain? No/denies            [facility] PT Assessment - 08/19/15 0001    Assessment   Medical Diagnosis Rt trochanteric bursitis   Referring Provider dr. Georgina Snell   Onset Date/Surgical Date 06/10/15  flare up    Hand Dominance Right   Next MD Visit 4//1717   AROM   Right Hip Flexion 115   Left Hip Flexion 122   Strength   Right Hip Flexion 4+/5   Right Hip Extension 4+/5   Right Hip ABduction --  5-/5   Right Hip ADduction 5/5   Left Hip Flexion --  5-/5   Left Hip Extension --  5-/5   Left Hip ABduction 5/5   Left Hip ADduction 5/5   Right/Left Knee --  5/5 bialt knee flex/ext   Right Ankle Dorsiflexion --  5-/5   Right Ankle Plantar Flexion 4/5   Right Ankle Inversion 5/5   Right  Ankle Eversion 4+/5   Left Ankle Dorsiflexion 5/5   Left Ankle Plantar Flexion 4/5   Left Ankle Inversion 5/5   Left Ankle Eversion --  5-/5   Palpation   Spinal mobility decreased pain and tightness with CPA/UPA L4/5/S1    Palpation comment tightness through the hip flexors; QL; piriformis; hip abductors bila Rt >> Lt tenderness through greater trochanter Rt > Lt                      [facility] Adult PT Treatment/Exercise - 08/19/15 0001    Knee/Hip Exercises: Stretches   Passive Hamstring Stretch 2 reps;30 seconds   Hip Flexor Stretch 2 reps;30 seconds   Piriformis Stretch 3 reps;30 seconds  added various angles for piriformis x3 20-30 sec    Other Knee/Hip Stretches hip rotation stretches in prone - PT assist     Knee/Hip Exercises: Aerobic   Nustep L5 x 5    Knee/Hip Exercises: Standing   Heel Raises 10 reps   Other Standing Knee Exercises hip extension x 10 ea   Other Standing Knee  Exercises hip abduction x 10 ea    Knee/Hip Exercises: Supine   Bridges Limitations --  bridging x 10 w/ 5 sec hold    Other Supine Knee/Hip Exercises clam w/ red TB x 10   Other Supine Knee/Hip Exercises adductor squeeze with ball x 10   Modalities   Modalities Electrical Stimulation;Moist Heat   Moist Heat Therapy   Number Minutes Moist Heat 20 Minutes   Moist Heat Location Hip  Rt and bilat lumbar   Electrical Stimulation   Electrical Stimulation Location posterior Rt hip sacrum to greater trochanter   Electrical Stimulation Action IFC   Electrical Stimulation Parameters to tolerance   Electrical Stimulation Goals Pain;Tone   Manual Therapy   Manual therapy comments pt prone    Joint Mobilization PA mobs Rt greater trochanter    Soft tissue mobilization Rt lumbar paraspinals; piriformis; hip abductors   Myofascial Release Rt hip - LB    Passive ROM Rt hip rotation w/ patient in prone position                 PT Education - 08/19/15 0958    Education provided Yes    Education Details HEP   Person(s) Educated Patient   Methods Explanation;Demonstration;Tactile cues;Verbal cues;Handout   Comprehension Verbalized understanding;Returned demonstration;Verbal cues required;Tactile cues required             PT Long Term Goals - 08/19/15 1024    PT LONG TERM GOAL #1   Title Increase mobilty and ROM through Rt hip to equal or > than Lt hp 09/16/15   Time 6   Period Weeks   Status On-going   PT LONG TERM GOAL #2   Title 4+/5 to 5/5 strength bilat LE's 09/16/15   Time 6   Period Weeks   Status Partially Met   PT LONG TERM GOAL #3   Title Improve walking and exercise tolerance with no more than 0/10 to 2/10 pain level for 15-20 min 09/16/15    Time 6   Period Weeks   Status Partially Met  good gains in functional walking and exercise tolerance   PT LONG TERM GOAL #4   Title I in HEP 09/16/15   Time 6   Period Weeks   Status On-going   PT LONG TERM GOAL #5   Title Improve FOTO to </= 40% limitation 09/16/15   Time 6   Period Weeks   Status On-going               Plan - 08/19/15 0943    Clinical Impression Statement Less pain; incresaed ROM/mobility; improved strength bilat LE's. TENS unit is working great for home. Continued improvement in symptoms. Progressing well toward stated goals of therapy. Will benefit from continued treatment to improve functional outcome and progress to I HEP    Rehab Potential Good   PT Frequency 2x / week   PT Duration 6 weeks   PT Treatment/Interventions Patient/family education;ADLs/Self Care Home Management;Neuromuscular re-education;Therapeutic exercise;Therapeutic activities;Cryotherapy;Electrical Stimulation;Iontophoresis 57m/ml Dexamethasone;Moist Heat;Ultrasound;Manual techniques;Dry needling   PT Next Visit Plan progress HEP , postural correction; manual work; TDN; strengthening; further assess balance as needed(very tight piriformis/glut med Rt > Lt)   PT Home Exercise Plan HEP; myofacial ball release  work; TENS     Consulted and Agree with Plan of Care Patient      Patient will benefit from skilled therapeutic intervention in order to improve the following deficits and impairments:  Postural dysfunction, Improper body mechanics, Pain, Increased  fascial restricitons, Increased muscle spasms, Decreased range of motion, Decreased mobility, Decreased strength, Decreased endurance, Decreased activity tolerance  Visit Diagnosis: Hip pain, right  Stiffness of right hip, not elsewhere classified  Muscle weakness (generalized)  Myalgia     Problem List Patient Active Problem List   Diagnosis Date Noted  . Trochanteric bursitis of right hip 07/27/2015  . ASCVD (arteriosclerotic cardiovascular disease) 02/05/2015  . IBS (irritable bowel syndrome) 02/05/2015  . Rectal bleeding 02/05/2015  . Pulmonary nodules 02/05/2015  . Glaucoma 01/06/2015  . Fibromyalgia 01/06/2015  . Hyperlipidemia 01/06/2015  . Obstructive sleep apnea on CPAP 01/06/2015  . Osteoporosis 01/06/2015    Cassandra Harbold Nilda Simmer PT, MPH  08/19/2015, 10:25 AM  [facility] [place], [place], [address] Phone: [phone]   Fax:  [phone]  Name: TAMEYAH KOCH MRN: [id number] Date of Birth: 15-Nov-1940

## 2015-08-19 NOTE — Patient Instructions (Signed)
Diagonal knee to chest in different angles  Working toward knee toward opposite shoulder 20-30 sec hold x 3   ABDUCTION: Standing (Active)    Stand, feet flat. Lift right leg out to side.  Complete 1-3___ sets of _10__ repetitions. Perform __1_ sessions per day.     EXTENSION: Standing (Active)    Stand, both feet flat. Draw right leg behind body as far as possible.  Complete __1-3_ sets of __10_ repetitions. Perform __1_ sessions per day.

## 2015-08-24 ENCOUNTER — Encounter: Payer: Self-pay | Admitting: Family Medicine

## 2015-08-24 ENCOUNTER — Ambulatory Visit (INDEPENDENT_AMBULATORY_CARE_PROVIDER_SITE_OTHER): Payer: Medicare Other | Admitting: Family Medicine

## 2015-08-24 ENCOUNTER — Encounter: Payer: Self-pay | Admitting: Rehabilitative and Restorative Service Providers"

## 2015-08-24 ENCOUNTER — Ambulatory Visit (INDEPENDENT_AMBULATORY_CARE_PROVIDER_SITE_OTHER): Payer: Medicare Other | Admitting: Rehabilitative and Restorative Service Providers"

## 2015-08-24 VITALS — BP 125/80 | HR 73 | Wt 165.0 lb

## 2015-08-24 DIAGNOSIS — M791 Myalgia, unspecified site: Secondary | ICD-10-CM

## 2015-08-24 DIAGNOSIS — M6281 Muscle weakness (generalized): Secondary | ICD-10-CM | POA: Diagnosis not present

## 2015-08-24 DIAGNOSIS — M25651 Stiffness of right hip, not elsewhere classified: Secondary | ICD-10-CM

## 2015-08-24 DIAGNOSIS — M7061 Trochanteric bursitis, right hip: Secondary | ICD-10-CM

## 2015-08-24 DIAGNOSIS — M25551 Pain in right hip: Secondary | ICD-10-CM

## 2015-08-24 NOTE — Therapy (Addendum)
[place], [place], [address] Phone: [phone]   Fax:  [phone]  Physical Therapy Treatment  Patient Details  Name: [name] [name] MRN: [id number] Date of Birth: 07/30/1940 Referring Provider: Dr. Georgina Snell   Encounter Date: 08/24/2015      PT End of Session - 08/24/15 0940    Visit Number 6   Number of Visits 12   Date for PT Re-Evaluation 09/16/15   PT Start Time 0940   PT Stop Time 1032   PT Time Calculation (min) 52 min   Activity Tolerance Patient tolerated treatment well      Past Medical History  Diagnosis Date  . OSA on CPAP 07/27/06  . HSV-2 (herpes simplex virus 2) infection 2009  . Anxiety     intermittent  . Rectal bleeding     with hemorrhoids  . GERD (gastroesophageal reflux disease)   . Fibromyalgia   . Osteoporosis   . blood in stool   . Diverticulosis   . Hemorrhoids   . Glaucoma     Past Surgical History  Procedure Laterality Date  . Hemorrhoids removed      There were no vitals filed for this visit.      Subjective Assessment - 08/24/15 0940    Subjective Continued improvementi n the hip pain and exercises are going well.    Currently in Pain? No/denies            [facility] PT Assessment - 08/24/15 0001    Assessment   Medical Diagnosis Rt trochanteric bursitis   Referring Provider Dr. Georgina Snell    Onset Date/Surgical Date 06/10/15  flare up    Hand Dominance Right   Next MD Visit 4//1717   Strength   Right Hip Flexion 4+/5   Right Hip Extension 4+/5   Right Hip ABduction --  5-/5   Right Hip ADduction 5/5   Left Hip Flexion --  5-/5   Left Hip Extension --  5-/5   Left Hip ABduction 5/5   Left Hip ADduction 5/5   Right/Left Knee --  5/5 bilat knee flex/ext   Right Ankle Dorsiflexion --  5-/5   Right Ankle Plantar Flexion 4/5   Right Ankle Inversion 5/5   Right Ankle Eversion 4+/5   Left Ankle Dorsiflexion 5/5   Left Ankle Plantar  Flexion 4/5   Left Ankle Inversion 5/5   Left Ankle Eversion --  5-/5   Palpation   Spinal mobility decreased pain and tightness with CPA/UPA L4/5/S1    Palpation comment tightness through the hip flexors; QL; piriformis; hip abductors bila Rt >> Lt tenderness through greater trochanter Rt > Lt                      [facility] Adult PT Treatment/Exercise - 08/24/15 0001    Knee/Hip Exercises: Stretches   Passive Hamstring Stretch 2 reps;30 seconds   Hip Flexor Stretch 2 reps;30 seconds   Piriformis Stretch 3 reps;30 seconds  added various angles for piriformis x3 20-30 sec    Other Knee/Hip Stretches hip rotation stretches in prone - PT assist     Knee/Hip Exercises: Aerobic   Nustep L5 x 5    Knee/Hip Exercises: Standing   Heel Raises 10 reps   Other Standing Knee Exercises hip extension x 10 ea   Other Standing Knee Exercises hip abduction x 10 ea    Knee/Hip Exercises: Supine   Bridges Limitations --  bridging  x 10 w/ 5 sec hold           Trigger Point Dry Needling - 08/24/15 1012    Consent Given? Yes   Education Handout Provided Yes   Gluteus Maximus Response Palpable increased muscle length;Twitch response elicited  Glut medius    Piriformis Response Twitch response elicited;Palpable increased muscle length                   PT Long Term Goals - 08/24/15 1014    PT LONG TERM GOAL #1   Title Increase mobilty and ROM through Rt hip to equal or > than Lt hp 09/16/15   Time 6   Period Weeks   Status On-going   PT LONG TERM GOAL #2   Title 4+/5 to 5/5 strength bilat LE's 09/16/15   Time 6   Period Weeks   Status Partially Met   PT LONG TERM GOAL #3   Title Improve walking and exercise tolerance with no more than 0/10 to 2/10 pain level for 15-20 min 09/16/15    Time 6   Period Weeks   Status Achieved   PT LONG TERM GOAL #4   Title I in HEP 09/16/15   Time 6   Period Weeks   Status On-going   PT LONG TERM GOAL #5   Title Improve FOTO to </=  40% limitation 09/16/15   Time 6   Period Weeks   Status On-going               Plan - 08/24/15 1015    Clinical Impression Statement Excellent progress overall with less pain and increased mobility and ROM; strength is increasing in bilat LE's. She is using her TENS unit at home with good results. She is progressing well toward goals of therapy. Responded well to initial trial of TDN today. Will assess response in future treatment.    Rehab Potential Good   PT Frequency 2x / week   PT Duration 6 weeks   PT Treatment/Interventions Patient/family education;ADLs/Self Care Home Management;Neuromuscular re-education;Therapeutic exercise;Therapeutic activities;Cryotherapy;Electrical Stimulation;Iontophoresis 48m/ml Dexamethasone;Moist Heat;Ultrasound;Manual techniques;Dry needling   PT Next Visit Plan progress HEP , postural correction; manual work; TDN; strengthening; further assess balance as needed(very tight piriformis/glut med Rt > Lt) - assess response to TDN   PT Home Exercise Plan HEP; myofacial ball release work; TENS        Patient will benefit from skilled therapeutic intervention in order to improve the following deficits and impairments:  Postural dysfunction, Improper body mechanics, Pain, Increased fascial restricitons, Increased muscle spasms, Decreased range of motion, Decreased mobility, Decreased strength, Decreased endurance, Decreased activity tolerance  Visit Diagnosis: Hip pain, right  Stiffness of right hip, not elsewhere classified  Muscle weakness (generalized)  Myalgia     Problem List Patient Active Problem List   Diagnosis Date Noted  . Trochanteric bursitis of right hip 07/27/2015  . ASCVD (arteriosclerotic cardiovascular disease) 02/05/2015  . IBS (irritable bowel syndrome) 02/05/2015  . Rectal bleeding 02/05/2015  . Pulmonary nodules 02/05/2015  . Glaucoma 01/06/2015  . Fibromyalgia 01/06/2015  . Hyperlipidemia 01/06/2015  . Obstructive  sleep apnea on CPAP 01/06/2015  . Osteoporosis 01/06/2015    Jhordyn Hoopingarner PNilda SimmerPT, MPH  08/24/2015, 10:50 AM  [facility] [address]Phone: [phone]  Fax:  [phone] Name: [name] FOLDENMRN: [id number] of Birth: [birth year]

## 2015-08-24 NOTE — Patient Instructions (Signed)

## 2015-08-24 NOTE — Assessment & Plan Note (Signed)
Much better. Continue physical therapy. Return as needed.

## 2015-08-24 NOTE — Progress Notes (Signed)
       [name] CopasYvonne R [name] is a 75 y.o. female who [facility] today for follow-up right hip trochanteric bursitis. She was originally seen on March 20th. She's had 6 physical therapy sessions and is feeling much better. She is essentially pain free with only a few physical therapy sessions left.   Past Medical History  Diagnosis Date  . OSA on CPAP 07/27/06  . HSV-2 (herpes simplex virus 2) infection 2009  . Anxiety     intermittent  . Rectal bleeding     with hemorrhoids  . GERD (gastroesophageal reflux disease)   . Fibromyalgia   . Osteoporosis   . blood in stool   . Diverticulosis   . Hemorrhoids   . Glaucoma    Past Surgical History  Procedure Laterality Date  . Hemorrhoids removed     Social History  Substance Use Topics  . Smoking status: Former Smoker    Quit date: 03/07/1982  . Smokeless tobacco: Not on file  . Alcohol Use: 0.0 oz/week    0 Standard drinks or equivalent per week     Comment: 2 drinks per month   family history includes Heart attack in her father; Hyperlipidemia in her mother; Hypertension in her mother.  ROS as above Medications: Current Outpatient Prescriptions  Medication Sig Dispense Refill  . alendronate (FOSAMAX) 70 MG tablet Take 70 mg by mouth once a week. Take with a full glass of water on an empty stomach.    Marland Kitchen. aspirin 81 MG tablet Take 81 mg by mouth daily.    Marland Kitchen. atorvastatin (LIPITOR) 10 MG tablet Take 1 tablet (10 mg total) by mouth daily. 90 tablet 1  . chlorhexidine (PERIDEX) 0.12 % solution Reported on 08/05/2015    . Cholecalciferol (VITAMIN D PO) Take by mouth.    . dorzolamide-timolol (COSOPT) 22.3-6.8 MG/ML ophthalmic solution     . DULoxetine (CYMBALTA) 30 MG capsule Take 1 capsule by mouth  daily 90 capsule 2  . hydrocortisone (ANUSOL-HC) 25 MG suppository Place 1 suppository (25 mg total) rectally 3 (three) times  daily as needed for hemorrhoids or itching. 24 suppository 11  . latanoprost (XALATAN) 0.005 % ophthalmic solution     . loratadine (CLARITIN) 10 MG tablet Take 10 mg by mouth.    . Multiple Vitamin (MULTIVITAMINS PO) Take by mouth.    . polyethylene glycol (MIRALAX / GLYCOLAX) packet Take 17 g by mouth daily.    . timolol (BETIMOL) 0.5 % ophthalmic solution 1 drop.     No current facility-administered medications for this visit.   Allergies  Allergen Reactions  . Alphagan P [Brimonidine Tartrate]     rash  . Atorvastatin Rash  . Codeine Rash  . Penicillins Rash  . Sulfur Rash     Exam:  BP 125/80 mmHg  Pulse 73  Wt 165 lb (74.844 kg) Gen: Well NAD Normal gait  No results found for this or any previous visit (from the past 24 hour(s)). No results found.   Please see individual assessment and plan sections.

## 2015-08-24 NOTE — Patient Instructions (Signed)
Thank you for coming in today. Return as needed.  Continue the PT.

## 2015-08-26 ENCOUNTER — Ambulatory Visit (INDEPENDENT_AMBULATORY_CARE_PROVIDER_SITE_OTHER): Payer: Medicare Other | Admitting: Physical Therapy

## 2015-08-26 DIAGNOSIS — M2560 Stiffness of unspecified joint, not elsewhere classified: Secondary | ICD-10-CM

## 2015-08-26 DIAGNOSIS — M791 Myalgia, unspecified site: Secondary | ICD-10-CM

## 2015-08-26 DIAGNOSIS — M25651 Stiffness of right hip, not elsewhere classified: Secondary | ICD-10-CM | POA: Diagnosis not present

## 2015-08-26 DIAGNOSIS — M6281 Muscle weakness (generalized): Secondary | ICD-10-CM | POA: Diagnosis not present

## 2015-08-26 DIAGNOSIS — M25551 Pain in right hip: Secondary | ICD-10-CM | POA: Diagnosis not present

## 2015-08-26 DIAGNOSIS — Z7409 Other reduced mobility: Secondary | ICD-10-CM

## 2015-08-26 DIAGNOSIS — R29898 Other symptoms and signs involving the musculoskeletal system: Secondary | ICD-10-CM

## 2015-08-26 DIAGNOSIS — M623 Immobility syndrome (paraplegic): Secondary | ICD-10-CM

## 2015-08-26 NOTE — Therapy (Signed)
[place], [place], [address] Phone: [phone]   Fax:  [phone]  Physical Therapy Treatment  Patient Details  Name: [name] [name] MRN: [id number] Date of Birth: [birth year] Referring Provider: Dr. Georgina Snell  Encounter Date: 08/26/2015      PT End of Session - 08/26/15 1437    Visit Number 7   Number of Visits 12   Date for PT Re-Evaluation 09/16/15   PT Start Time 1430   PT Stop Time 1516   PT Time Calculation (min) 46 min   Activity Tolerance Patient tolerated treatment well;No increased pain      Past Medical History  Diagnosis Date  . OSA on CPAP 07/27/06  . HSV-2 (herpes simplex virus 2) infection 2009  . Anxiety     intermittent  . Rectal bleeding     with hemorrhoids  . GERD (gastroesophageal reflux disease)   . Fibromyalgia   . Osteoporosis   . blood in stool   . Diverticulosis   . Hemorrhoids   . Glaucoma     Past Surgical History  Procedure Laterality Date  . Hemorrhoids removed      There were no vitals filed for this visit.      Subjective Assessment - 08/26/15 1433    Subjective Pt reports "bruisey" in area that was dry needled, but it feels much looser.   Is preparing for long trip to [place]. HEP going well; not performing step exercise due to not having any stairs.    Currently in Pain? No/denies            [facility] PT Assessment - 08/26/15 0001    Assessment   Medical Diagnosis Rt trochanteric bursitis   Referring Provider Dr. Georgina Snell   Onset Date/Surgical Date 06/10/15  flare up    Hand Dominance Right   Next MD Visit PRN          [facility] Adult PT Treatment/Exercise - 08/26/15 0001    Knee/Hip Exercises: Stretches   Passive Hamstring Stretch 2 reps;30 seconds   Hip Flexor Stretch 2 reps;30 seconds   Piriformis Stretch 3 reps;30 seconds  added various angles for piriformis x3 20-30 sec    Gastroc Stretch Right;Left;2 reps;20 seconds   Knee/Hip  Exercises: Aerobic   Nustep L5 x 5.5 min    Knee/Hip Exercises: Standing   Heel Raises 10 reps;Both   Lateral Step Up 10 reps  toe touch lateral 6 inch step    Forward Step Up 10 reps  6 inch step    Other Standing Knee Exercises hip extension x 10 ea; semi tandem stance Rt/Lt x 2 reps each side (~30 sec with occasional UE support)   Other Standing Knee Exercises hip abduction x 10 ea    Knee/Hip Exercises: Supine   Other Supine Knee/Hip Exercises bridge with adductor squeeze x 5 sec hold x 10 reps    Modalities   Modalities --  pt declined    Manual Therapy   Joint Mobilization PA mobs Rt greater trochanter    Soft tissue mobilization to Rt piriformis, deep rotators.    Passive ROM Rt hip rotation w/ patient in prone position           PT Long Term Goals - 08/24/15 1014    PT LONG TERM GOAL #1   Title Increase mobilty and ROM through Rt hip to equal or > than Lt hp 09/16/15   Time 6   Period Weeks  Status On-going   PT LONG TERM GOAL #2   Title 4+/5 to 5/5 strength bilat LE's 09/16/15   Time 6   Period Weeks   Status Partially Met   PT LONG TERM GOAL #3   Title Improve walking and exercise tolerance with no more than 0/10 to 2/10 pain level for 15-20 min 09/16/15    Time 6   Period Weeks   Status Achieved   PT LONG TERM GOAL #4   Title I in HEP 09/16/15   Time 6   Period Weeks   Status On-going   PT LONG TERM GOAL #5   Title Improve FOTO to </= 40% limitation 09/16/15   Time 6   Period Weeks   Status On-going               Plan - 08/26/15 1500    Clinical Impression Statement Pt tolerated all exercises well, with minimal increase in pain - mostly muscular fatigue.  Pt declined use of modalities as her IBS was "acting up" and she didn't think she couldn't tolerate it today. Pt making great progress towards remaining goals.    Rehab Potential Good   PT Frequency 2x / week   PT Duration 6 weeks   PT Next Visit Plan Continue progressive strengthening/  stretching for Rt hip.  Modalities and TDN as indicated.    Consulted and Agree with Plan of Care Patient      Patient will benefit from skilled therapeutic intervention in order to improve the following deficits and impairments:  Postural dysfunction, Improper body mechanics, Pain, Increased fascial restricitons, Increased muscle spasms, Decreased range of motion, Decreased mobility, Decreased strength, Decreased endurance, Decreased activity tolerance  Visit Diagnosis: Hip pain, right  Stiffness of right hip, not elsewhere classified  Muscle weakness (generalized)  Myalgia  Stiffness due to immobility  Weakness of both lower extremities     Problem List Patient Active Problem List   Diagnosis Date Noted  . Trochanteric bursitis of right hip 07/27/2015  . ASCVD (arteriosclerotic cardiovascular disease) 02/05/2015  . IBS (irritable bowel syndrome) 02/05/2015  . Rectal bleeding 02/05/2015  . Pulmonary nodules 02/05/2015  . Glaucoma 01/06/2015  . Fibromyalgia 01/06/2015  . Hyperlipidemia 01/06/2015  . Obstructive sleep apnea on CPAP 01/06/2015  . Osteoporosis 01/06/2015   [name] [name], PTA 08/26/2015 3:16 PM  [facility] [facility] [place], [place], [address] Phone: [phone]   Fax:  [phone]  Name: [name] [name] MRN: [id number] Date of Birth: 1941/03/03

## 2015-08-28 ENCOUNTER — Encounter: Payer: Medicare Other | Admitting: Rehabilitative and Restorative Service Providers"

## 2015-09-02 ENCOUNTER — Encounter: Payer: Self-pay | Admitting: Rehabilitative and Restorative Service Providers"

## 2015-09-02 ENCOUNTER — Ambulatory Visit (INDEPENDENT_AMBULATORY_CARE_PROVIDER_SITE_OTHER): Payer: Medicare Other | Admitting: Rehabilitative and Restorative Service Providers"

## 2015-09-02 DIAGNOSIS — M6281 Muscle weakness (generalized): Secondary | ICD-10-CM | POA: Diagnosis not present

## 2015-09-02 DIAGNOSIS — M25651 Stiffness of right hip, not elsewhere classified: Secondary | ICD-10-CM | POA: Diagnosis not present

## 2015-09-02 DIAGNOSIS — M791 Myalgia, unspecified site: Secondary | ICD-10-CM

## 2015-09-02 DIAGNOSIS — M25551 Pain in right hip: Secondary | ICD-10-CM | POA: Diagnosis not present

## 2015-09-02 NOTE — Patient Instructions (Signed)
Shoulder Blade Squeeze    Rotate shoulders back, then squeeze shoulder blades down and back. Hold 10 sec Repeat __10__ times. Do _several___ sessions per day. Can use swim noodle along the spine    Scapular Retraction: Elbow Flexion (Standing)    With elbows bent to 90, pinch shoulder blades together and rotate arms out, keeping elbows bent. Repeat __10__ times per set. Do __1__ sets per session. Do _2-3___ sessions per day.    Resisted External Rotation: in Neutral - Bilateral   Keep palms up!  Sit or stand, tubing in both hands, elbows at sides, bent to 90, forearms forward. Pinch shoulder blades together and rotate forearms out. Keep elbows at sides. Repeat __10__ times per set. Do ___2-3_ sets per session. Do _several___ sessions per day.

## 2015-09-02 NOTE — Therapy (Addendum)
[place], [place], [address] Phone: [phone]   Fax:  [phone]  Physical Therapy Treatment  Patient Details  Name: [name] [name] MRN: [id number] Date of Birth: 27-Apr-1941 Referring Provider: dr. Georgina Snell   Encounter Date: 09/02/2015      PT End of Session - 09/02/15 1021    Visit Number 8   Number of Visits 12   Date for PT Re-Evaluation 09/16/15   PT Start Time 1021   PT Stop Time 1115   PT Time Calculation (min) 54 min   Activity Tolerance Patient tolerated treatment well      Past Medical History  Diagnosis Date  . OSA on CPAP 07/27/06  . HSV-2 (herpes simplex virus 2) infection 2009  . Anxiety     intermittent  . Rectal bleeding     with hemorrhoids  . GERD (gastroesophageal reflux disease)   . Fibromyalgia   . Osteoporosis   . blood in stool   . Diverticulosis   . Hemorrhoids   . Glaucoma     Past Surgical History  Procedure Laterality Date  . Hemorrhoids removed      There were no vitals filed for this visit.      Subjective Assessment - 09/02/15 1026    Subjective Did well on her trip to [place]. No increase in the hip pain. Pleased with progress.    Currently in Pain? No/denies            [facility] PT Assessment - 09/02/15 0001    Assessment   Medical Diagnosis Rt trochanteric bursitis   Referring Provider dr. Georgina Snell    Onset Date/Surgical Date 06/10/15  flare up    Hand Dominance Right   Next MD Visit PRN   Observation/Other Assessments   Focus on Therapeutic Outcomes (FOTO)  35% limitation    AROM   Right Hip Flexion 117   Left Hip Flexion 123   Strength   Right Hip Flexion --  5-/5   Right Hip Extension --  5-/5   Right Hip ABduction 5/5   Right Hip ADduction 5/5   Left Hip Flexion 5/5   Left Hip Extension 5/5   Left Hip ABduction 5/5   Left Hip ADduction 5/5   Right/Left Knee --  5/5 bilat knee flex/ext   Right Ankle Plantar Flexion 4+/5    Right Ankle Inversion 5/5   Right Ankle Eversion --  5-/5   Left Ankle Dorsiflexion 5/5   Left Ankle Plantar Flexion 4+/5   Left Ankle Inversion 5/5   Left Ankle Eversion 5/5   Palpation   Spinal mobility decreased pain and tightness with CPA/UPA L4/5/S1    Palpation comment minimal tightness through the hip flexors; QL; piriformis; hip abductors bila Rt >> Lt tenderness through greater trochanter Rt > Lt                      [facility] Adult PT Treatment/Exercise - 09/02/15 0001    Exercises   Exercises --  prone pelvic press    Knee/Hip Exercises: Stretches   Passive Hamstring Stretch 2 reps;30 seconds   Hip Flexor Stretch 2 reps;30 seconds   Piriformis Stretch 3 reps;30 seconds  added various angles for piriformis x3 20-30 sec    Other Knee/Hip Stretches hip rotation stretches in prone - PT assist     Knee/Hip Exercises: Aerobic   Nustep L6 x 5 min    Knee/Hip Exercises: Standing  Lateral Step Up 10 reps  toe touch lateral 6 inch step    Forward Step Up 10 reps  6 inch step    Other Standing Knee Exercises hip extension x 10    Other Standing Knee Exercises hip abduction x 10 ea    Knee/Hip Exercises: Supine   Other Supine Knee/Hip Exercises bridge with adductor squeeze x 5 sec hold x 10 reps    Knee/Hip Exercises: Sidelying   Clams 2x10 reps each side.   red TB    Modalities   Modalities --  pt declined    Moist Heat Therapy   Number Minutes Moist Heat 15 Minutes   Moist Heat Location Lumbar Spine  thoracic spine area    Electrical Stimulation   Electrical Stimulation Location posterior Rt hip sacrum to greater trochanter   Electrical Stimulation Action IFC   Electrical Stimulation Parameters to tolerance   Electrical Stimulation Goals Tone   Manual Therapy   Joint Mobilization PA mobs Rt greater trochanter    Passive ROM Rt hip rotation w/ patient in prone position                 PT Education - 09/02/15 1101    Education provided Yes    Education Details HEP   Person(s) Educated Patient   Methods Explanation;Demonstration;Tactile cues;Verbal cues;Handout   Comprehension Verbalized understanding;Returned demonstration;Verbal cues required;Tactile cues required             PT Long Term Goals - 09/02/15 1108    PT LONG TERM GOAL #1   Title Increase mobilty and ROM through Rt hip to equal or > than Lt hp 09/16/15   Time 6   Period Weeks   Status Achieved   PT LONG TERM GOAL #2   Title 4+/5 to 5/5 strength bilat LE's 09/16/15   Time 6   Period Weeks   Status Achieved   PT LONG TERM GOAL #3   Title Improve walking and exercise tolerance with no more than 0/10 to 2/10 pain level for 15-20 min 09/16/15    Time 6   Period Weeks   Status Achieved   PT LONG TERM GOAL #4   Title I in HEP 09/16/15   Time 6   Period Weeks   Status Achieved   PT LONG TERM GOAL #5   Title Improve FOTO to </= 40% limitation 09/16/15   Time 6   Period Weeks   Status Achieved               Plan - 09/02/15 1105    Clinical Impression Statement Tolerated road trip well with several hours in the car.  She demonstrates good gains in ROM; strength; mobilty through LE's. She reports good pain control and return to normal functional activities. Excellent progress with therapy. Pt has accomplished goals of therapy. She will call in the next two weeks if she feels she has a flare up or needs to return to PT.    Rehab Potential Good   PT Frequency 2x / week   PT Duration 6 weeks   PT Treatment/Interventions Patient/family education;ADLs/Self Care Home Management;Neuromuscular re-education;Therapeutic exercise;Therapeutic activities;Cryotherapy;Electrical Stimulation;Iontophoresis 47m/ml Dexamethasone;Moist Heat;Ultrasound;Manual techniques;Dry needling   PT Next Visit Plan Patient will call within the next two weeks if she feels she needs additioinal appointments.    PT Home Exercise Plan HEP; myofacial ball release work; TENS     Consulted  and Agree with Plan of Care Patient      Patient will  benefit from skilled therapeutic intervention in order to improve the following deficits and impairments:  Postural dysfunction, Improper body mechanics, Pain, Increased fascial restricitons, Increased muscle spasms, Decreased range of motion, Decreased mobility, Decreased strength, Decreased endurance, Decreased activity tolerance  Visit Diagnosis: Hip pain, right  Stiffness of right hip, not elsewhere classified  Muscle weakness (generalized)  Myalgia     Problem List Patient Active Problem List   Diagnosis Date Noted  . Trochanteric bursitis of right hip 07/27/2015  . ASCVD (arteriosclerotic cardiovascular disease) 02/05/2015  . IBS (irritable bowel syndrome) 02/05/2015  . Rectal bleeding 02/05/2015  . Pulmonary nodules 02/05/2015  . Glaucoma 01/06/2015  . Fibromyalgia 01/06/2015  . Hyperlipidemia 01/06/2015  . Obstructive sleep apnea on CPAP 01/06/2015  . Osteoporosis 01/06/2015    Mauricio Dahlen Nilda Simmer PT, MPH  09/02/2015, 12:38 PM  [facility] [place], [place], [address] Phone: [phone]   Fax:  [phone]  Name: [name] [name] MRN: [id number] Date of Birth: [birth year]    PHYSICAL THERAPY DISCHARGE SUMMARY  Visits from Start of Care: 8  Current functional level related to goals / functional outcomes: See last progress note for discharge status    Remaining deficits: See last progress notes for discharge status    Education / Equipment: HEP  Plan: Patient agrees to discharge.  Patient goals were partially met. Patient is being discharged due to being pleased with the current functional level.  ?????    Shadee Montoya P. Helene Kelp PT, MPH 10/21/2015 1:19 PM

## 2015-10-03 ENCOUNTER — Other Ambulatory Visit: Payer: Self-pay | Admitting: Family Medicine

## 2015-10-08 ENCOUNTER — Other Ambulatory Visit: Payer: Self-pay | Admitting: Family Medicine

## 2015-10-23 ENCOUNTER — Encounter: Payer: Self-pay | Admitting: Family Medicine

## 2015-10-23 ENCOUNTER — Ambulatory Visit (INDEPENDENT_AMBULATORY_CARE_PROVIDER_SITE_OTHER): Payer: Medicare Other | Admitting: Family Medicine

## 2015-10-23 VITALS — BP 107/69 | HR 73 | Wt 166.0 lb

## 2015-10-23 DIAGNOSIS — E785 Hyperlipidemia, unspecified: Secondary | ICD-10-CM | POA: Diagnosis not present

## 2015-10-23 DIAGNOSIS — M810 Age-related osteoporosis without current pathological fracture: Secondary | ICD-10-CM | POA: Diagnosis not present

## 2015-10-23 DIAGNOSIS — Z5181 Encounter for therapeutic drug level monitoring: Secondary | ICD-10-CM

## 2015-10-23 DIAGNOSIS — M19049 Primary osteoarthritis, unspecified hand: Secondary | ICD-10-CM

## 2015-10-23 DIAGNOSIS — Z79899 Other long term (current) drug therapy: Secondary | ICD-10-CM

## 2015-10-23 MED ORDER — ATORVASTATIN CALCIUM 10 MG PO TABS: ORAL_TABLET | ORAL | Status: DC

## 2015-10-23 MED ORDER — MELOXICAM 15 MG PO TABS: 15.0000 mg | ORAL_TABLET | Freq: Every day | ORAL | Status: DC | PRN

## 2015-10-23 NOTE — Progress Notes (Signed)
CC: [name] [name] is a 75 y.o. female is here for Medication Refill and Carpal Tunnel   Subjective: HPI:  Follow-up osteoporosis: It's been about 3 months now since she's been taking Fosamax due to frequent dental procedures. She's had some worsening pain in her hands but denies any other joint pain. She denies any other bone or orthopedic complaints.  Follow-up hyperlipidemia: She is taking atorvastatin daily basis with no right upper quadrant pain. She has a history of coronary vascular disease. She wants motion stopped taking this medication. LDL was favorable back in December. She denies any right upper quadrant pain or myalgias.  Her biggest complaint today is bilateral hand pain worse after periods of inactivity or the day after she's been knitting. She tells me that it's been restricting her from being able to knit and this is starting to depress her. She denies any swelling redness or warmth and has had no interventions as of yet. She denies joint pain elsewhere. She denies trauma   Review Of Systems Outlined In HPI  Past Medical History  Diagnosis Date  . OSA on CPAP 07/27/06  . HSV-2 (herpes simplex virus 2) infection 2009  . Anxiety     intermittent  . Rectal bleeding     with hemorrhoids  . GERD (gastroesophageal reflux disease)   . Fibromyalgia   . Osteoporosis   . blood in stool   . Diverticulosis   . Hemorrhoids   . Glaucoma     Past Surgical History  Procedure Laterality Date  . Hemorrhoids removed     Family History  Problem Relation Age of Onset  . Heart attack Father   . Hyperlipidemia Mother   . Hypertension Mother     Social History   Social History  . Marital Status: Married    Spouse Name: N/A  . Number of Children: N/A  . Years of Education: N/A   Occupational History  . Not on file.   Social History Main Topics  . Smoking status: Former Smoker    Quit date: 03/07/1982  . Smokeless tobacco: Not on file  . Alcohol Use: 0.0 oz/week     0 Standard drinks or equivalent per week     Comment: 2 drinks per month  . Drug Use: No  . Sexual Activity: No   Other Topics Concern  . Not on file   Social History Narrative   Tobacco use cigarettes: former smoker, tobacco history last updated 10/28/2013. No smoking. No tobacco exposure, no alcohol. No caffeine. No recreational drug use. Exercise: yes, some walking-not much. 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 Marital status: married.     Objective: BP 107/69 mmHg  Pulse 73  Wt 166 lb (75.297 kg)  General: Alert and Oriented, No Acute Distress HEENT: Pupils equal, round, reactive to light. Conjunctivae clear.  Moist mucous membranes Lungs: Clear to auscultation bilaterally, no wheezing/ronchi/rales.  Comfortable work of breathing. Good air movement. Cardiac: Regular rate and rhythm. Normal S1/S2.  No murmurs, rubs, nor gallops.   Extremities: No peripheral edema.  Strong peripheral pulses. Mild joint hypertrophy in the PIPs bilaterally other gross abnormality Mental Status: No depression, anxiety, nor agitation. Skin: Warm and dry.  Assessment & Plan: [name] [name] was seen today for medication refill and carpal tunnel.  Diagnoses and all orders for this visit:  Osteoporosis  Hyperlipidemia -     atorvastatin (LIPITOR) 10 MG tablet; Take 1 tablet by mouth  daily -     Hepatic function panel  Osteoarthritis of hand, unspecified laterality, unspecified osteoarthritis type -     meloxicam (MOBIC) 15 MG tablet; Take 1 tablet (15 mg total) by mouth daily as needed for pain.  Encounter for monitoring statin therapy -     Hepatic function panel   Osteoporosis: Clinically stable agree with her decision to stop atorvastatin until her dentist says it's safe to restart Hyperlipidemia: Discussed with her the importance of taking atorvastatin given her history of coronary artery disease. She is okay with this recommendation. She needs to have a  hepatic function panel today and repeat lipids in December. Osteoporosis the hands: Uncontrolled chronic conditions start aspirin and meloxicam.  Return in about 6 months (around 04/23/2016) for Cholesterol .

## 2015-10-24 LAB — HEPATIC FUNCTION PANEL
ALBUMIN: 4 g/dL (ref 3.6–5.1)
ALT: 21 U/L (ref 6–29)
AST: 22 U/L (ref 10–35)
Alkaline Phosphatase: 89 U/L (ref 33–130)
BILIRUBIN DIRECT: 0.2 mg/dL (ref <=0.2)
Indirect Bilirubin: 0.7 mg/dL (ref 0.2–1.2)
Total Bilirubin: 0.9 mg/dL (ref 0.2–1.2)
Total Protein: 7.1 g/dL (ref 6.1–8.1)

## 2015-12-18 ENCOUNTER — Other Ambulatory Visit: Payer: Self-pay | Admitting: Osteopathic Medicine

## 2015-12-18 DIAGNOSIS — Z9289 Personal history of other medical treatment: Secondary | ICD-10-CM

## 2016-01-25 ENCOUNTER — Encounter: Payer: Self-pay | Admitting: Family Medicine

## 2016-01-29 ENCOUNTER — Ambulatory Visit: Payer: Medicare Other

## 2016-01-29 ENCOUNTER — Ambulatory Visit (INDEPENDENT_AMBULATORY_CARE_PROVIDER_SITE_OTHER): Payer: Medicare Other

## 2016-01-29 DIAGNOSIS — Z1231 Encounter for screening mammogram for malignant neoplasm of breast: Secondary | ICD-10-CM

## 2016-01-29 DIAGNOSIS — Z9289 Personal history of other medical treatment: Secondary | ICD-10-CM

## 2016-02-04 ENCOUNTER — Ambulatory Visit (INDEPENDENT_AMBULATORY_CARE_PROVIDER_SITE_OTHER): Payer: Medicare Other | Admitting: Family Medicine

## 2016-02-04 DIAGNOSIS — Z23 Encounter for immunization: Secondary | ICD-10-CM

## 2016-02-04 NOTE — Progress Notes (Signed)
Flu shot given LD without complications.

## 2016-02-09 ENCOUNTER — Encounter: Payer: Self-pay | Admitting: Family Medicine

## 2016-02-16 ENCOUNTER — Encounter: Payer: Self-pay | Admitting: Sports Medicine

## 2016-02-16 ENCOUNTER — Ambulatory Visit (INDEPENDENT_AMBULATORY_CARE_PROVIDER_SITE_OTHER): Payer: Medicare Other | Admitting: Sports Medicine

## 2016-02-16 DIAGNOSIS — A6000 Herpesviral infection of urogenital system, unspecified: Secondary | ICD-10-CM | POA: Diagnosis not present

## 2016-02-16 MED ORDER — VALACYCLOVIR HCL 1 G PO TABS: 1000.0000 mg | ORAL_TABLET | Freq: Two times a day (BID) | ORAL | 2 refills | Status: DC

## 2016-02-16 NOTE — Assessment & Plan Note (Signed)
Symptoms are classic, she has had an outbreak before. Discontinue her acyclovir, both topical and oral, and switching to Valtrex. Return to see us if no better in a week or so, for a full pelvic exam.

## 2016-02-16 NOTE — Progress Notes (Signed)
  Subjective:    CC: Genital rash  HPI: This is a very pleasant 75 year old female with a history of genital herpes, she comes in with a several day history of increasing pain and fullness on one of her labia, with grouped vesicular rash that feels very similar to before. She has trouble taking her oral acyclovir due to the size of the pills, and has been using topical acyclovir which has helped somewhat. Symptoms are moderate, persistent, no vaginal discharge.  Past medical history:  Negative.  See flowsheet/record as well for more information.  Surgical history: Negative.  See flowsheet/record as well for more information.  Family history: Negative.  See flowsheet/record as well for more information.  Social history: Negative.  See flowsheet/record as well for more information.  Allergies, and medications have been entered into the medical record, reviewed, and no changes needed.   Review of Systems: No fevers, chills, night sweats, weight loss, chest pain, or shortness of breath.   Objective:    General: Well Developed, well nourished, and in no acute distress.  Neuro: Alert and oriented x3, extra-ocular muscles intact, sensation grossly intact.  HEENT: Normocephalic, atraumatic, pupils equal round reactive to light, neck supple, no masses, no lymphadenopathy, thyroid nonpalpable.  Skin: Warm and dry, no rashes. Cardiac: Regular rate and rhythm, no murmurs rubs or gallops, no lower extremity edema.  Respiratory: Clear to auscultation bilaterally. Not using accessory muscles, speaking in full sentences. Genital exam: Deferred, patient was able to offer a very classic description.  Impression and Recommendations:    Genital herpes Symptoms are classic, she has had an outbreak before. Discontinue her acyclovir, both topical and oral, and switching to Valtrex. Return to see us if no better in a week or so, for a full pelvic exam.  I spent 25 minutes with this patient, greater than 50%  was face-to-face time counseling regarding the above diagnoses

## 2016-04-25 ENCOUNTER — Ambulatory Visit (INDEPENDENT_AMBULATORY_CARE_PROVIDER_SITE_OTHER): Payer: Medicare Other | Admitting: Osteopathic Medicine

## 2016-04-25 ENCOUNTER — Encounter: Payer: Self-pay | Admitting: Osteopathic Medicine

## 2016-04-25 VITALS — BP 129/71 | HR 70 | Ht 62.0 in | Wt 165.0 lb

## 2016-04-25 DIAGNOSIS — Z9989 Dependence on other enabling machines and devices: Secondary | ICD-10-CM

## 2016-04-25 DIAGNOSIS — M810 Age-related osteoporosis without current pathological fracture: Secondary | ICD-10-CM

## 2016-04-25 DIAGNOSIS — M19049 Primary osteoarthritis, unspecified hand: Secondary | ICD-10-CM

## 2016-04-25 DIAGNOSIS — M797 Fibromyalgia: Secondary | ICD-10-CM

## 2016-04-25 DIAGNOSIS — G4733 Obstructive sleep apnea (adult) (pediatric): Secondary | ICD-10-CM

## 2016-04-25 DIAGNOSIS — M25551 Pain in right hip: Secondary | ICD-10-CM | POA: Diagnosis not present

## 2016-04-25 DIAGNOSIS — E785 Hyperlipidemia, unspecified: Secondary | ICD-10-CM

## 2016-04-25 DIAGNOSIS — Z0000 Encounter for general adult medical examination without abnormal findings: Secondary | ICD-10-CM

## 2016-04-25 DIAGNOSIS — H409 Unspecified glaucoma: Secondary | ICD-10-CM

## 2016-04-25 DIAGNOSIS — A6000 Herpesviral infection of urogenital system, unspecified: Secondary | ICD-10-CM

## 2016-04-25 LAB — COMPLETE METABOLIC PANEL WITH GFR
ALBUMIN: 4 g/dL (ref 3.6–5.1)
ALK PHOS: 77 U/L (ref 33–130)
ALT: 16 U/L (ref 6–29)
AST: 22 U/L (ref 10–35)
BUN: 18 mg/dL (ref 7–25)
CO2: 27 mmol/L (ref 20–31)
Calcium: 9.4 mg/dL (ref 8.6–10.4)
Chloride: 105 mmol/L (ref 98–110)
Creat: 0.77 mg/dL (ref 0.60–0.93)
GFR, Est African American: 86 mL/min (ref >=60)
GFR, Est Non African American: 75 mL/min (ref >=60)
GLUCOSE: 96 mg/dL (ref 65–99)
POTASSIUM: 4.2 mmol/L (ref 3.5–5.3)
SODIUM: 141 mmol/L (ref 135–146)
Total Bilirubin: 1.3 mg/dL — ABNORMAL HIGH (ref 0.2–1.2)
Total Protein: 7.2 g/dL (ref 6.1–8.1)

## 2016-04-25 LAB — CBC
HEMATOCRIT: 38.2 % (ref 35.0–45.0)
HEMOGLOBIN: 12.7 g/dL (ref 11.7–15.5)
MCH: 31.5 pg (ref 27.0–33.0)
MCHC: 33.2 g/dL (ref 32.0–36.0)
MCV: 94.8 fL (ref 80.0–100.0)
MPV: 11.7 fL (ref 7.5–12.5)
Platelets: 199 10*3/uL (ref 140–400)
RBC: 4.03 MIL/uL (ref 3.80–5.10)
RDW: 13.7 % (ref 11.0–15.0)
WBC: 4.7 10*3/uL (ref 3.8–10.8)

## 2016-04-25 LAB — LIPID PANEL
CHOLESTEROL: 154 mg/dL (ref <200)
HDL: 53 mg/dL (ref >50)
LDL Cholesterol: 70 mg/dL (ref <100)
Total CHOL/HDL Ratio: 2.9 Ratio (ref <5.0)
Triglycerides: 157 mg/dL — ABNORMAL HIGH (ref <150)
VLDL: 31 mg/dL — AB (ref <30)

## 2016-04-25 MED ORDER — DULOXETINE HCL 30 MG PO CPEP: ORAL_CAPSULE | ORAL | 3 refills | Status: DC

## 2016-04-25 MED ORDER — ACYCLOVIR 5 % EX OINT: 1.0000 "application " | TOPICAL_OINTMENT | CUTANEOUS | 1 refills | Status: DC

## 2016-04-25 MED ORDER — MELOXICAM 15 MG PO TABS: 7.5000 mg | ORAL_TABLET | Freq: Every day | ORAL | 1 refills | Status: DC | PRN

## 2016-04-25 NOTE — Patient Instructions (Signed)
Plan:  Routine labs today  Medication refills sent to mail order pharmacy  Plan to recheck cholesterol in the next 3-6 months or so, please call a week ahead of your appointment to make sure that we have the orders in place for you to go down to the lab at least 1-2 days prior to your visit to that we can discuss results at your appointment  Happy Holidays!  Please let us know if there is anything else we can do for you! -Dr. Mervyn SkeetersA.        Please note: Preventive care issues were addressed today per annual physical requirements and should be 100% covered under your insurance, however there were other medical issues which were also addressed and insurance may bill you separately for "problem-based visit." Any questions or concerns about charges which may appear on your bill ill should be directed to your insurance company or to [facility] billing department, please contact our office with any other questions.

## 2016-04-25 NOTE — Progress Notes (Signed)
HPI: [name] [name] is a 75 y.o. female  who presents to [facility] [name] [name] today, 04/25/16,  for Medicare Annual Wellness Exam and establish care  Patient presents for annual physical/Medicare wellness exam. NO complaints today.  Patient also to establish care with me, previously seen by my colleague Dr. Ivan Anchors. Medical history reviewed as below  Osteoporosis: On Fosamax. She does not recall last bone density test she had.  Hyperlipidemia & Dry Mouth: On atorvastatin, states this is giving her dental problems due to dry mouth, she would like to stop this medication. She is also on eyedrops for glaucoma and CPAP for sleep apnea, this may also be complicating dry mouth issues.  Fibromyalgia: On duloxetine, this is been killing her symptoms quite well. She denies problems with anxiety/depression.  Arthritis: Hand pain frequently due to knitting, occasional knee and hip pain on the right. Does well on meloxicam. She has instructions at home for home exercises which help when she does these but she is not typically doing these routinely  Ophthalmology: On eye drops for glaucoma  HSV: Previously on Valtrex but she was not able to swallow these pills even with cutting them because these were quite large. She had some leftover topical acyclovir which she has had for several years and uses intermittent late. About 4 outbreaks in the past 5-6 years total.     Past medical, surgical, social and family history reviewed:  Patient Active Problem List   Diagnosis Date Noted  . Genital herpes 02/16/2016  . Trochanteric bursitis of right hip 07/27/2015  . ASCVD (arteriosclerotic cardiovascular disease) 02/05/2015  . IBS (irritable bowel syndrome) 02/05/2015  . Rectal bleeding 02/05/2015  . Pulmonary nodules 02/05/2015  . Glaucoma 01/06/2015  . Fibromyalgia 01/06/2015  . Hyperlipidemia 01/06/2015  . Obstructive sleep apnea on CPAP 01/06/2015  . Osteoporosis  01/06/2015    Past Surgical History:  Procedure Laterality Date  . hemorrhoids removed      Social History   Social History  . Marital status: Married    Spouse name: N/A  . Number of children: N/A  . Years of education: N/A   Occupational History  . Not on file.   Social History Main Topics  . Smoking status: Former Smoker    Quit date: 03/07/1982  . Smokeless tobacco: Not on file  . Alcohol use 0.0 oz/week     Comment: 2 drinks per month  . Drug use: No  . Sexual activity: No   Other Topics Concern  . Not on file   Social History Narrative   Tobacco use cigarettes: former smoker, tobacco history last updated 10/28/2013. No smoking. No tobacco exposure, no alcohol. No caffeine. No recreational drug use. Exercise: yes, some walking-not much. 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 Marital status: married.    Family History  Problem Relation Age of Onset  . Heart attack Father   . Hyperlipidemia Mother   . Hypertension Mother      Current medication list and allergy/intolerance information reviewed:    Outpatient Encounter Prescriptions as of 04/25/2016  Medication Sig Note  . alendronate (FOSAMAX) 70 MG tablet Take 70 mg by mouth once a week. Take with a full glass of water on an empty stomach.   Marland Kitchen aspirin 81 MG tablet Take 81 mg by mouth daily.   Marland Kitchen atorvastatin (LIPITOR) 10 MG tablet Take 1 tablet by mouth  daily   . chlorhexidine (PERIDEX) 0.12 % solution Reported on 08/05/2015 07/27/2015: Received  from: External Pharmacy  . Cholecalciferol (VITAMIN D PO) Take by mouth.   . dorzolamide-timolol (COSOPT) 22.3-6.8 MG/ML ophthalmic solution  07/27/2015: Received from: External Pharmacy  . DULoxetine (CYMBALTA) 30 MG capsule Take 1 capsule by mouth  daily   . hydrocortisone (ANUSOL-HC) 25 MG suppository Place 1 suppository (25 mg total) rectally 3 (three) times daily as needed for hemorrhoids or itching.   . latanoprost (XALATAN)  0.005 % ophthalmic solution  07/27/2015: Received from: External Pharmacy  . loratadine (CLARITIN) 10 MG tablet Take 10 mg by mouth. 03/14/2014: Received from: [facility]  . meloxicam (MOBIC) 15 MG tablet Take 1 tablet (15 mg total) by mouth daily as needed for pain.   . Multiple Vitamin (MULTIVITAMINS PO) Take by mouth.   . polyethylene glycol (MIRALAX / GLYCOLAX) packet Take 17 g by mouth daily.   . timolol (BETIMOL) 0.5 % ophthalmic solution 1 drop. 03/14/2014: Received from: [facility]  . valACYclovir (VALTREX) 1000 MG tablet Take 1 tablet (1,000 mg total) by mouth 2 (two) times daily.    No facility-administered encounter medications on file as of 04/25/2016.     Allergies  Allergen Reactions  . Alphagan P [Brimonidine Tartrate]     rash  . Codeine Rash  . Penicillins Rash  . Sulfur Rash       Review of Systems: Review of Systems - History obtained from the patient No complaints today   Constitutional: No recent illness  HEENT: No  headache, no vision change  Cardiac: No  chest pain, No  pressure, No palpitations  Respiratory:  No  shortness of breath. No  Cough  Gastrointestinal: No  abdominal pain, no change on bowel habits  Musculoskeletal: No new myalgia/arthralgia  Skin: No  Rash  Hem/Onc: No  easy bruising/bleeding, No  abnormal lumps/bumps  Neurologic: No  weakness, No  Dizziness  Psychiatric: No  concerns with depression, No  concerns with anxiety   Medicare Wellness Questionnaire  Are there smokers in your home (other than you)? no  Depression Screen (Note: if answer to either of the following is "Yes", a more complete depression screening is indicated)   Q1: Over the past two weeks, have you felt down, depressed or hopeless? no  Q2: Over the past two weeks, have you felt little interest or pleasure in doing things? no  Have you lost interest or pleasure in daily life? no  Do you often feel hopeless? no  Do you cry easily over simple problems?  no  Activities of Daily Living In your present state of health, do you have any difficulty performing the following activities?:  Driving? no Managing money?  no Feeding yourself? no Getting from bed to chair? no Climbing a flight of stairs? no Preparing food and eating?: no Bathing or showering? no Getting dressed: no Getting to the toilet? no Using the toilet: no Moving around from place to place: no In the past year have you fallen or had a near fall?: no  Hearing Difficulties:  Do you often ask people to speak up or repeat themselves? no Do you experience ringing or noises in your ears? no  Do you have difficulty understanding soft or whispered voices? no  Memory Difficulties:  Do you feel that you have a problem with memory? no  Do you often misplace items? no  Do you feel safe at home?  yes  Sexual Health:   Are you sexually active?  No  Do you have more than one partner?  No  Advanced Directives:   Advanced directives discussed: Living Will, Healthcare POA   Additional information provided: no  Risk Factors  Current exercise habits: treadmill at home   Dietary issues discussed:no concern   Cardiac risk factors: hypercholesterolemia/hyperlipidemia, generalized debilitation   Exam:  BP 129/71   Pulse 70   Ht 5\' 2"  (1.575 m)   Wt 165 lb (74.8 kg)   BMI 30.18 kg/m  Vision by Snellen chart: right eye:see nurse notes, left eye:see nurse notes  Constitutional: VS see above. General Appearance: alert, well-developed, well-nourished, NAD  Ears, Nose, Mouth, Throat: MMM  Neck: No masses, trachea midline.   Respiratory: Normal respiratory effort. no wheeze, no rhonchi, no rales  Cardiovascular:No lower extremity edema.   Musculoskeletal: Gait normal. No clubbing/cyanosis of digits.   Neurological: Normal balance/coordination. No tremor. Recalls 3 objects and able to read face of watch with correct time.   Skin: warm, dry, intact. No rash/ulcer.    Psychiatric: Normal judgment/insight. Normal mood and affect. Oriented x3.     ASSESSMENT/PLAN:   Encounter for Medicare annual wellness exam  Encounter for Medicare annual wellness exam  Osteoarthritis of hand, unspecified laterality, unspecified osteoarthritis type - Plan: meloxicam (MOBIC) 15 MG tablet  Right hip pain - Plan: DULoxetine (CYMBALTA) 30 MG capsule  Osteoporosis without current pathological fracture, unspecified osteoporosis type - Plan: DG Bone Density, VITAMIN D 25 Hydroxy (Vit-D Deficiency, Fractures), COMPLETE METABOLIC PANEL WITH GFR, CBC  Hyperlipidemia, unspecified hyperlipidemia type - Discontinued statins due to patient's concern for possible dry mouth side effects, plan to follow-up on cholesterol in 6 months. - Plan: Lipid panel, COMPLETE METABOLIC PANEL WITH GFR  Glaucoma of both eyes, unspecified glaucoma type  Obstructive sleep apnea on CPAP  Fibromyalgia - Controlled on Cymbalta  Genital herpes simplex, unspecified site - Refilled topical antiviral, can consider liquid acyclovir if needed  CANCER SCREENING  Lung - does not need  Colon - does not need - previous colonoscopy no cancer  Prostate - does not need  Breast - does not need  Cervical - does not need OTHER DISEASE SCREENING  Lipid - needs  DM2 - needs  AAA - female 5065-75yo ever smoked - does not need  Osteoporosis - women 77yo+, men 77yo+ - needs INFECTIOUS DISEASE SCREENING  HIV - does not need  GC/CT - does not need  HepC -does not need  TB - does not need ADULT VACCINATION  Influenza - annual vaccine recommended  Td - booster every 10 years - does not need  Zoster - option at 50, yes at 6060+ - does not need  PCV13 - does not need  PPSV23 - does not need Immunization History  Administered Date(s) Administered  . Influenza,inj,Quad PF,36+ Mos 04/08/2015, 02/04/2016  . Pneumococcal Conjugate-13 04/10/2014  . Pneumococcal Polysaccharide-23 03/17/2004  . Tdap  06/26/2007  . Zoster 03/01/2006   OTHER  Fall - exercise and Vit D age 32+ - needs  Advanced Directives -  Discussed as above   During the course of the visit the patient was educated and counseled about appropriate screening and preventive services as noted above.   Patient Instructions (the written plan) was given to the patient.  Medicare Attestation I have personally reviewed: The patient's medical and social history Their use of alcohol, tobacco or illicit drugs Their current medications and supplements The patient's functional ability including ADLs,fall risks, home safety risks, cognitive, and hearing and visual impairment Diet and physical activities Evidence for depression or mood disorders  The patient's  weight, height, BMI, and visual acuity have been recorded in the chart.  I have made referrals, counseling, and provided education to the patient based on review of the above and I have provided the patient with a written personalized care plan for preventive services.     Sunnie Nielsen, DO   04/25/16   Visit summary with medication list and pertinent instructions was printed for patient to review. All questions at time of visit were answered - patient instructed to contact office with any additional concerns. ER/RTC precautions were reviewed with the patient. Follow-up plan: Return in about 6 months (around 10/24/2016) for Recheck cholesterol after stopping statin medication.   Total time spent 40 minutes, greater than 50% of the visit was counseling and coordinating care for diagnosis of Osteoarthritis of hand, unspecified laterality, unspecified osteoarthritis type, Right hip pain, Osteoporosis without current pathological fracture, unspecified osteoporosis type, Hyperlipidemia, unspecified hyperlipidemia type, Glaucoma of both eyes, unspecified glaucoma type, Obstructive sleep apnea on CPAP, Fibromyalgia, and Genital herpes simplex, unspecified site were also pertinent  to this visit.     Patient Instructions  Plan:  Routine labs today  Medication refills sent to mail order pharmacy  Plan to recheck cholesterol in the next 3-6 months or so, please call a week ahead of your appointment to make sure that we have the orders in place for you to go down to the lab at least 1-2 days prior to your visit to that we can discuss results at your appointment  Happy Holidays!  Please let us know if there is anything else we can do for you! -Dr. Mervyn Skeeters.        Please note: Preventive care issues were addressed today per annual physical requirements and should be 100% covered under your insurance, however there were other medical issues which were also addressed and insurance may bill you separately for "problem-based visit." Any questions or concerns about charges which may appear on your bill ill should be directed to your insurance company or to [facility] billing department, please contact our office with any other questions.

## 2016-04-26 LAB — VITAMIN D 25 HYDROXY (VIT D DEFICIENCY, FRACTURES): Vit D, 25-Hydroxy: 53 ng/mL (ref 30–100)

## 2016-06-15 ENCOUNTER — Ambulatory Visit (INDEPENDENT_AMBULATORY_CARE_PROVIDER_SITE_OTHER): Payer: Medicare Other

## 2016-06-15 ENCOUNTER — Encounter: Payer: Self-pay | Admitting: Osteopathic Medicine

## 2016-06-15 ENCOUNTER — Ambulatory Visit (INDEPENDENT_AMBULATORY_CARE_PROVIDER_SITE_OTHER): Payer: Medicare Other | Admitting: Osteopathic Medicine

## 2016-06-15 VITALS — BP 115/59 | HR 88 | Temp 97.9°F | Wt 155.0 lb

## 2016-06-15 DIAGNOSIS — J40 Bronchitis, not specified as acute or chronic: Secondary | ICD-10-CM | POA: Diagnosis not present

## 2016-06-15 DIAGNOSIS — R05 Cough: Secondary | ICD-10-CM | POA: Diagnosis not present

## 2016-06-15 DIAGNOSIS — J449 Chronic obstructive pulmonary disease, unspecified: Secondary | ICD-10-CM

## 2016-06-15 DIAGNOSIS — J984 Other disorders of lung: Secondary | ICD-10-CM | POA: Diagnosis not present

## 2016-06-15 DIAGNOSIS — R059 Cough, unspecified: Secondary | ICD-10-CM

## 2016-06-15 MED ORDER — AZITHROMYCIN 250 MG PO TABS: ORAL_TABLET | ORAL | 0 refills | Status: DC

## 2016-06-15 MED ORDER — GUAIFENESIN 400 MG PO TABS: 400.0000 mg | ORAL_TABLET | Freq: Four times a day (QID) | ORAL | 1 refills | Status: DC

## 2016-06-15 NOTE — Progress Notes (Addendum)
HPI: [name] [name] is a 76 y.o. female who presents to [name] 06/15/16 for chief complaint of:  Chief Complaint  Patient presents with  . Cough    Acute Illness: . Context: (+)recent travel and sick contacts  . Location: chest, sinuses . Quality: started with dry & sore throat last Wednesday in [place], saw doctor there and was diagnosed with tonsillitis (but s/p tonsillectomy), was also coughing a lot at the time.  . Assoc signs/symptoms: see ROS - productive cough, initially had fever/chills but nothing at this time, no fever x4 or so days . Duration: 7 days . Modifying factors: has tried the following OTC/Rx medications: unknown antibiotic that her husband had on the trip   Past medical, social and family history reviewed.   Immune compromising conditions or other risk factors: advanced age, CAD, recent travel   Patient Active Problem List   Diagnosis Date Noted  . Genital herpes 02/16/2016  . Trochanteric bursitis of right hip 07/27/2015  . ASCVD (arteriosclerotic cardiovascular disease) 02/05/2015  . IBS (irritable bowel syndrome) 02/05/2015  . Rectal bleeding 02/05/2015  . Pulmonary nodules 02/05/2015  . Glaucoma 01/06/2015  . Fibromyalgia 01/06/2015  . Hyperlipidemia 01/06/2015  . Obstructive sleep apnea on CPAP 01/06/2015  . Osteoporosis 01/06/2015     Current medications and allergies reviewed.     Review of Systems:  Constitutional: none now fever/chills  HEENT: Yes  headache, Yes  sore throat, No  swollen glands  Cardiovascular: No chest pain  Respiratory:Yes  cough, No  shortness of breath  Gastrointestinal: Yes  nausea, No  vomiting,  No  diarrhea  Musculoskeletal:   No  myalgia/arthralgia  Skin/Integument:  No  rash   Detailed Exam:  BP (!) 115/59   Pulse 88   Temp 97.9 F (36.6 C) (Oral)   Wt 155 lb (70.3 kg)   BMI 28.35 kg/m   Constitutional:   VSS, see above.   General Appearance: alert,  well-developed, well-nourished, NAD  Eyes:   Normal lids and conjunctive, non-icteric sclera  Ears, Nose, Mouth, Throat:   Normal external inspection ears/nares  Normal mouth/lips/gums, MMM  normal TM  posterior pharynx without erythema, without exudate  nasal mucosa normal  Skin:  Normal inspection, no rash or concerning lesions noted on limited exam  Neck:   No masses, trachea midline. normal lymph nodes  Respiratory:   Normal respiratory effort.   Very faint wheeze, no rhonchi/rales  Cardiovascular:   S1/S2 normal, no murmur/rub/gallop auscultated. RRR.    Personal read CXR: ?atelectasis vs evolving PNA on R, radiology read pending as of end of visit  Dg Chest 2 View  Result Date: 06/16/2016 CLINICAL DATA:  One week of fever and nonproductive cough, former smoker. EXAM: CHEST  2 VIEW COMPARISON:  CT scan of the chest November 13, 2014 and chest x-ray of March 17, 2010. FINDINGS: The lungs are mildly hyperinflated. There are coarse lung markings in the retrosternal region bilaterally most compatible with atelectasis. There is no alveolar infiltrate or pleural effusion. There is stable biapical pleural thickening. The heart and pulmonary vascularity are normal. The bony thorax exhibits no acute abnormality. IMPRESSION: Subsegmental atelectasis in the lingula and right middle lobe. No alveolar pneumonia nor CHF. Underlying COPD. Electronically Signed   By: David  [place] M.D.   On: 06/16/2016 07:35     ASSESSMENT/PLAN:  Bronchitis - Plan: azithromycin (ZITHROMAX) 250 MG tablet  Cough - Plan: DG Chest 2 View, guaifenesin (HUMIBID E) 400 MG  TABS tablet   Visit summary was printed for the patient with medications and pertinent instructions for patient to review. ER/RTC precautions reviewed. All questions answered. Return if symptoms worsen or fail to improve.

## 2016-06-15 NOTE — Patient Instructions (Signed)

## 2016-07-07 ENCOUNTER — Other Ambulatory Visit: Payer: Self-pay

## 2016-07-07 DIAGNOSIS — E782 Mixed hyperlipidemia: Secondary | ICD-10-CM

## 2016-07-15 LAB — LIPID PANEL W/REFLEX DIRECT LDL
Cholesterol: 233 mg/dL — ABNORMAL HIGH (ref <200)
HDL: 54 mg/dL (ref >50)
LDL-CHOLESTEROL: 148 mg/dL — AB
Non-HDL Cholesterol (Calc): 179 mg/dL — ABNORMAL HIGH (ref <130)
Total CHOL/HDL Ratio: 4.3 Ratio (ref <5.0)
Triglycerides: 178 mg/dL — ABNORMAL HIGH (ref <150)

## 2016-07-19 ENCOUNTER — Ambulatory Visit (INDEPENDENT_AMBULATORY_CARE_PROVIDER_SITE_OTHER): Payer: Medicare Other | Admitting: Osteopathic Medicine

## 2016-07-19 ENCOUNTER — Encounter: Payer: Self-pay | Admitting: Osteopathic Medicine

## 2016-07-19 VITALS — BP 128/58 | HR 90 | Ht 62.0 in | Wt 157.0 lb

## 2016-07-19 DIAGNOSIS — Z7189 Other specified counseling: Secondary | ICD-10-CM | POA: Diagnosis not present

## 2016-07-19 DIAGNOSIS — N9489 Other specified conditions associated with female genital organs and menstrual cycle: Secondary | ICD-10-CM | POA: Diagnosis not present

## 2016-07-19 DIAGNOSIS — M79641 Pain in right hand: Secondary | ICD-10-CM | POA: Diagnosis not present

## 2016-07-19 MED ORDER — CLOBETASOL PROPIONATE 0.05 % EX CREA: 1.0000 "application " | TOPICAL_CREAM | Freq: Two times a day (BID) | CUTANEOUS | 0 refills | Status: DC

## 2016-07-19 MED ORDER — ATORVASTATIN CALCIUM 10 MG PO TABS: 10.0000 mg | ORAL_TABLET | Freq: Every day | ORAL | 3 refills | Status: DC

## 2016-07-19 NOTE — Progress Notes (Signed)
HPI: [name] [name] is a 76 y.o. female  who presents to [name] today, 07/19/16,  for chief complaint of:  Chief Complaint  Patient presents with  . Other    Hand Pain . Context: would like referral to ortho - previously had surgery on this hand w/ Dr. Merlyn Lot Sr.  . Location: R hand, palmar aspect worse, most fingers . Quality: burning, stiffness, hx trigger finger, decreased grip strength, no numbness  Cholesterol: questions if she should be on the medication. LDL increased a bit since stopping this and dry mouth hasn't improved.   Vulvar burning: uses Clobetasol few times per year which helps stop the burning before it gets bad, no hx rash/bleeding. Requests refill.    Past medical history, surgical history, social history and family history reviewed.  Patient Active Problem List   Diagnosis Date Noted  . Genital herpes 02/16/2016  . Trochanteric bursitis of right hip 07/27/2015  . ASCVD (arteriosclerotic cardiovascular disease) 02/05/2015  . IBS (irritable bowel syndrome) 02/05/2015  . Rectal bleeding 02/05/2015  . Pulmonary nodules 02/05/2015  . Glaucoma 01/06/2015  . Fibromyalgia 01/06/2015  . Hyperlipidemia 01/06/2015  . Obstructive sleep apnea on CPAP 01/06/2015  . Osteoporosis 01/06/2015    Current medication list and allergy/intolerance information reviewed.   Current Outpatient Prescriptions on File Prior to Visit  Medication Sig Dispense Refill  . acyclovir ointment (ZOVIRAX) 5 % Apply 1 application topically every 3 (three) hours. To affected area, as needed for outbreak 15 g 1  . alendronate (FOSAMAX) 70 MG tablet Take 70 mg by mouth once a week. Take with a full glass of water on an empty stomach.    Marland Kitchen aspirin 81 MG tablet Take 81 mg by mouth daily.    Marland Kitchen azithromycin (ZITHROMAX) 250 MG tablet 2 tabs po on Day 1, then 1 tab daily Days 2 - 5 6 tablet 0  . chlorhexidine (PERIDEX) 0.12 % solution Reported on 08/05/2015     . Cholecalciferol (VITAMIN D PO) Take by mouth.    . dorzolamide-timolol (COSOPT) 22.3-6.8 MG/ML ophthalmic solution     . DULoxetine (CYMBALTA) 30 MG capsule Take 1 capsule by mouth  daily 90 capsule 3  . guaifenesin (HUMIBID E) 400 MG TABS tablet Take 1 tablet (400 mg total) by mouth every 6 (six) hours. 30 tablet 1  . hydrocortisone (ANUSOL-HC) 25 MG suppository Place 1 suppository (25 mg total) rectally 3 (three) times daily as needed for hemorrhoids or itching. 24 suppository 11  . latanoprost (XALATAN) 0.005 % ophthalmic solution     . loratadine (CLARITIN) 10 MG tablet Take 10 mg by mouth.    . meloxicam (MOBIC) 15 MG tablet Take 0.5-1 tablets (7.5-15 mg total) by mouth daily as needed for pain. 90 tablet 1  . Multiple Vitamin (MULTIVITAMINS PO) Take by mouth.    . polyethylene glycol (MIRALAX / GLYCOLAX) packet Take 17 g by mouth daily.    . timolol (BETIMOL) 0.5 % ophthalmic solution 1 drop.    . valACYclovir (VALTREX) 1000 MG tablet Take 1 tablet (1,000 mg total) by mouth 2 (two) times daily. 14 tablet 2   No current facility-administered medications on file prior to visit.    Allergies  Allergen Reactions  . Alphagan P [Brimonidine Tartrate]     rash  . Codeine Rash  . Penicillins Rash  . Sulfur Rash      Review of Systems:  Constitutional: No recent illness  Cardiac: No  chest pain,  Respiratory:  No  shortness of breath  Musculoskeletal: +new myalgia/arthralgia  Skin: No  Rash   Exam:  BP (!) 128/58   Pulse 90   Ht 5\' 2"  (1.575 m)   Wt 157 lb (71.2 kg)   BMI 28.72 kg/m   Constitutional: VS see above. General Appearance: alert, well-developed, well-nourished, NAD  Eyes: Normal lids and conjunctive, non-icteric sclera  Ears, Nose, Mouth, Throat: MMM, Normal external inspection ears/nares/mouth/lips/gums.  Neck: No masses, trachea midline.   Respiratory: Normal respiratory effort  Musculoskeletal: Gait normal. Symmetric and independent movement of  all extremities. Neg tinel's/phalen's on R hand, decreased grip strength on R compared to L   Neurological: Normal balance/coordination. No tremor.  Skin: warm, dry, intact.   Psychiatric: Normal judgment/insight. Normal mood and affect. Oriented x3.    Recent Results (from the past 2160 hour(s))  Lipid panel     Status: Abnormal   Collection Time: 04/25/16 10:39 AM  Result Value Ref Range   Cholesterol 154 <200 mg/dL   Triglycerides 960 (H) <150 mg/dL   HDL 53 >45 mg/dL   Total CHOL/HDL Ratio 2.9 <5.0 Ratio   VLDL 31 (H) <30 mg/dL   LDL Cholesterol 70 <409 mg/dL  VITAMIN D 25 Hydroxy (Vit-D Deficiency, Fractures)     Status: None   Collection Time: 04/25/16 10:39 AM  Result Value Ref Range   Vit D, 25-Hydroxy 53 30 - 100 ng/mL    Comment: Vitamin D Status           25-OH Vitamin D        Deficiency                <20 ng/mL        Insufficiency         20 - 29 ng/mL        Optimal             > or = 30 ng/mL   For 25-OH Vitamin D testing on patients on D2-supplementation and patients for whom quantitation of D2 and D3 fractions is required, the [facility] 25-OH VIT D, (D2,D3), LC/MS/MS is recommended: order code 81191 (patients > 2 yrs).   COMPLETE METABOLIC PANEL WITH GFR     Status: Abnormal   Collection Time: 04/25/16 10:39 AM  Result Value Ref Range   Sodium 141 135 - 146 mmol/L   Potassium 4.2 3.5 - 5.3 mmol/L   Chloride 105 98 - 110 mmol/L   CO2 27 20 - 31 mmol/L   Glucose, Bld 96 65 - 99 mg/dL   BUN 18 7 - 25 mg/dL   Creat 4.78 2.95 - 6.21 mg/dL    Comment:   For patients > or = 78 years of age: The upper reference limit for Creatinine is approximately 13% higher for people identified as African-American.      Total Bilirubin 1.3 (H) 0.2 - 1.2 mg/dL   Alkaline Phosphatase 77 33 - 130 U/L   AST 22 10 - 35 U/L   ALT 16 6 - 29 U/L   Total Protein 7.2 6.1 - 8.1 g/dL   Albumin 4.0 3.6 - 5.1 g/dL   Calcium 9.4 8.6 - 30.8 mg/dL   GFR, Est African American 86  >=60 mL/min   GFR, Est Non African American 75 >=60 mL/min  CBC     Status: None   Collection Time: 04/25/16 10:39 AM  Result Value Ref Range   WBC 4.7 3.8 - 10.8 K/uL   RBC 4.03  3.80 - 5.10 MIL/uL   Hemoglobin 12.7 11.7 - 15.5 g/dL   HCT 78.238.2 95.635.0 - 21.345.0 %   MCV 94.8 80.0 - 100.0 fL   MCH 31.5 27.0 - 33.0 pg   MCHC 33.2 32.0 - 36.0 g/dL   RDW 08.613.7 57.811.0 - 46.915.0 %   Platelets 199 140 - 400 K/uL   MPV 11.7 7.5 - 12.5 fL  Lipid Panel w/reflex Direct LDL     Status: Abnormal   Collection Time: 07/15/16 12:00 PM  Result Value Ref Range   Cholesterol 233 (H) <200 mg/dL   Triglycerides 629178 (H) <150 mg/dL   HDL 54 >52>50 mg/dL   Total CHOL/HDL Ratio 4.3 <5.0 Ratio   Non-HDL Cholesterol (Calc) 179 (H) <130 mg/dL    Comment:   For patients with diabetes plus 1 major ASCVD risk factor, treating to a non-HDL-C goal of <100 mg/dL (LDL-C of <84<70 mg/dL) is considered a therapeutic option.      LDL-Cholesterol 148 (H) mg/dL    Comment: Reference range: <100   Desirable range <100 mg/dL for patients with CHD or diabetes and <70 mg/dL for diabetic patients with known heart disease.     The Martin-Hopkins calculation is a validated novel method that provides better accuracy than the Friedwald equation in the estimation of LDL-C, particulary when TG levels are 150-400 mg/dL and LDL-C levels are lower than 70 mg/dL. Reference:  Horald PollenMartin SS et al.  Comparison of a Novel Method vs the Lauralee EvenerFriedwald Equation for Estimating Low-Density Lipoprotein Cholesterol Levels From the Standard Lipid Profile.  JAMA. 1324;401(022013;310(19): 2061-2068.   For additional information, please refer to http://education.[facility].com/faq/FAQ164 (This link is being provided for informational/educational purposes only.)       ASSESSMENT/PLAN:   Right hand pain - Plan: Ambulatory referral to Orthopedic Surgery  Cardiac risk counseling - ASCVD risk 19.6% 10-yr when on statin, no tmuch higher when off, pt would like to  continue statins after dicsussion risk vs benefit  Vulvar burning - Refill Rx, pt declines exam today, advised steroids can worsen yeast or other infection, we may be missing other abn w/o exam, see me if worse   Follow-up plan: Return for ANNUAL PHYSICAL in 04/2017 - can call ahead to get lab orders in .  Visit summary with medication list and pertinent instructions was printed for patient to review, alert us if any changes needed. All questions at time of visit were answered - patient instructed to contact office with any additional concerns. ER/RTC precautions were reviewed with the patient and understanding verbalized.

## 2016-08-12 DIAGNOSIS — M65341 Trigger finger, right ring finger: Secondary | ICD-10-CM | POA: Insufficient documentation

## 2016-08-12 DIAGNOSIS — G5601 Carpal tunnel syndrome, right upper limb: Secondary | ICD-10-CM | POA: Insufficient documentation

## 2016-09-26 ENCOUNTER — Other Ambulatory Visit: Payer: Self-pay | Admitting: Osteopathic Medicine

## 2016-09-26 DIAGNOSIS — M19049 Primary osteoarthritis, unspecified hand: Secondary | ICD-10-CM

## 2016-11-04 ENCOUNTER — Other Ambulatory Visit: Payer: Self-pay | Admitting: Orthopedic Surgery

## 2016-12-01 ENCOUNTER — Encounter (HOSPITAL_BASED_OUTPATIENT_CLINIC_OR_DEPARTMENT_OTHER): Payer: Self-pay | Admitting: *Deleted

## 2016-12-05 ENCOUNTER — Other Ambulatory Visit: Payer: Self-pay | Admitting: Osteopathic Medicine

## 2016-12-05 DIAGNOSIS — M19049 Primary osteoarthritis, unspecified hand: Secondary | ICD-10-CM

## 2016-12-08 ENCOUNTER — Ambulatory Visit (HOSPITAL_BASED_OUTPATIENT_CLINIC_OR_DEPARTMENT_OTHER): Payer: Medicare Other | Admitting: Anesthesiology

## 2016-12-08 ENCOUNTER — Ambulatory Visit (HOSPITAL_BASED_OUTPATIENT_CLINIC_OR_DEPARTMENT_OTHER)
Admission: RE | Admit: 2016-12-08 | Discharge: 2016-12-08 | Disposition: A | Discharge status: Home / Self Care | Payer: Medicare Other | Source: Ambulatory Visit | Attending: Orthopedic Surgery | Admitting: Orthopedic Surgery

## 2016-12-08 ENCOUNTER — Encounter (HOSPITAL_BASED_OUTPATIENT_CLINIC_OR_DEPARTMENT_OTHER): Payer: Self-pay | Admitting: Anesthesiology

## 2016-12-08 ENCOUNTER — Encounter (HOSPITAL_BASED_OUTPATIENT_CLINIC_OR_DEPARTMENT_OTHER)
Admission: RE | Disposition: A | Discharge status: Home / Self Care | Payer: Self-pay | Source: Ambulatory Visit | Attending: Orthopedic Surgery

## 2016-12-08 DIAGNOSIS — R200 Anesthesia of skin: Secondary | ICD-10-CM | POA: Diagnosis present

## 2016-12-08 DIAGNOSIS — G4733 Obstructive sleep apnea (adult) (pediatric): Secondary | ICD-10-CM | POA: Diagnosis not present

## 2016-12-08 DIAGNOSIS — Z9989 Dependence on other enabling machines and devices: Secondary | ICD-10-CM | POA: Insufficient documentation

## 2016-12-08 DIAGNOSIS — Z87891 Personal history of nicotine dependence: Secondary | ICD-10-CM | POA: Diagnosis not present

## 2016-12-08 DIAGNOSIS — G5601 Carpal tunnel syndrome, right upper limb: Secondary | ICD-10-CM | POA: Insufficient documentation

## 2016-12-08 HISTORY — PX: CARPAL TUNNEL RELEASE: SHX101

## 2016-12-08 SURGERY — CARPAL TUNNEL RELEASE
Anesthesia: Monitor Anesthesia Care | Site: Wrist | Laterality: Right

## 2016-12-08 MED ORDER — EPHEDRINE SULFATE 50 MG/ML IJ SOLN
INTRAMUSCULAR | Status: DC | PRN
  Administered 2016-12-08: 10 mg via INTRAVENOUS

## 2016-12-08 MED ORDER — DEXAMETHASONE SODIUM PHOSPHATE 10 MG/ML IJ SOLN
INTRAMUSCULAR | Status: DC | PRN
  Administered 2016-12-08: 10 mg via INTRAVENOUS

## 2016-12-08 MED ORDER — PHENYLEPHRINE HCL 10 MG/ML IJ SOLN
INTRAMUSCULAR | Status: DC | PRN
  Administered 2016-12-08 (×2): 80 ug via INTRAVENOUS

## 2016-12-08 MED ORDER — VANCOMYCIN HCL IN DEXTROSE 1-5 GM/200ML-% IV SOLN
INTRAVENOUS | Status: AC
  Filled 2016-12-08: qty 200

## 2016-12-08 MED ORDER — FENTANYL CITRATE (PF) 100 MCG/2ML IJ SOLN: 50.0000 ug | INTRAMUSCULAR | Status: DC | PRN

## 2016-12-08 MED ORDER — FENTANYL CITRATE (PF) 100 MCG/2ML IJ SOLN
INTRAMUSCULAR | Status: DC | PRN
  Administered 2016-12-08 (×2): 50 ug via INTRAVENOUS

## 2016-12-08 MED ORDER — VANCOMYCIN HCL IN DEXTROSE 1-5 GM/200ML-% IV SOLN
1000.0000 mg | INTRAVENOUS | Status: AC
  Administered 2016-12-08: 1000 mg via INTRAVENOUS

## 2016-12-08 MED ORDER — BUPIVACAINE HCL (PF) 0.25 % IJ SOLN
INTRAMUSCULAR | Status: DC | PRN
  Administered 2016-12-08: 8 mL

## 2016-12-08 MED ORDER — LACTATED RINGERS IV SOLN
INTRAVENOUS | Status: DC
  Administered 2016-12-08: 08:00:00 via INTRAVENOUS

## 2016-12-08 MED ORDER — PROPOFOL 10 MG/ML IV BOLUS
INTRAVENOUS | Status: AC
  Filled 2016-12-08: qty 20

## 2016-12-08 MED ORDER — HYDROMORPHONE HCL 1 MG/ML IJ SOLN: 0.2500 mg | INTRAMUSCULAR | Status: DC | PRN

## 2016-12-08 MED ORDER — SCOPOLAMINE 1 MG/3DAYS TD PT72: 1.0000 | MEDICATED_PATCH | Freq: Once | TRANSDERMAL | Status: DC | PRN

## 2016-12-08 MED ORDER — EPHEDRINE 5 MG/ML INJ
INTRAVENOUS | Status: AC
  Filled 2016-12-08: qty 10

## 2016-12-08 MED ORDER — LIDOCAINE 2% (20 MG/ML) 5 ML SYRINGE
INTRAMUSCULAR | Status: AC
  Filled 2016-12-08: qty 5

## 2016-12-08 MED ORDER — PROMETHAZINE HCL 25 MG/ML IJ SOLN: 6.2500 mg | INTRAMUSCULAR | Status: DC | PRN

## 2016-12-08 MED ORDER — ONDANSETRON HCL 4 MG/2ML IJ SOLN
INTRAMUSCULAR | Status: AC
  Filled 2016-12-08: qty 2

## 2016-12-08 MED ORDER — DEXAMETHASONE SODIUM PHOSPHATE 10 MG/ML IJ SOLN
INTRAMUSCULAR | Status: AC
  Filled 2016-12-08: qty 1

## 2016-12-08 MED ORDER — MIDAZOLAM HCL 2 MG/2ML IJ SOLN: 1.0000 mg | INTRAMUSCULAR | Status: DC | PRN

## 2016-12-08 MED ORDER — ONDANSETRON HCL 4 MG/2ML IJ SOLN
INTRAMUSCULAR | Status: DC | PRN
  Administered 2016-12-08: 4 mg via INTRAVENOUS

## 2016-12-08 MED ORDER — CHLORHEXIDINE GLUCONATE 4 % EX LIQD: 60.0000 mL | Freq: Once | CUTANEOUS | Status: DC

## 2016-12-08 MED ORDER — FENTANYL CITRATE (PF) 100 MCG/2ML IJ SOLN
INTRAMUSCULAR | Status: AC
  Filled 2016-12-08: qty 2

## 2016-12-08 MED ORDER — PHENYLEPHRINE 40 MCG/ML (10ML) SYRINGE FOR IV PUSH (FOR BLOOD PRESSURE SUPPORT)
PREFILLED_SYRINGE | INTRAVENOUS | Status: AC
  Filled 2016-12-08: qty 10

## 2016-12-08 SURGICAL SUPPLY — 33 items
BLADE SURG 15 STRL LF DISP TIS (BLADE) ×1 IMPLANT
BLADE SURG 15 STRL SS (BLADE) ×2
BNDG CMPR 9X4 STRL LF SNTH (GAUZE/BANDAGES/DRESSINGS)
BNDG COHESIVE 3X5 TAN STRL LF (GAUZE/BANDAGES/DRESSINGS) ×2 IMPLANT
BNDG ESMARK 4X9 LF (GAUZE/BANDAGES/DRESSINGS) IMPLANT
BNDG GAUZE ELAST 4 BULKY (GAUZE/BANDAGES/DRESSINGS) ×2 IMPLANT
CHLORAPREP W/TINT 26ML (MISCELLANEOUS) ×2 IMPLANT
CORD BIPOLAR FORCEPS 12FT (ELECTRODE) ×2 IMPLANT
COVER BACK TABLE 60X90IN (DRAPES) ×2 IMPLANT
COVER MAYO STAND STRL (DRAPES) ×2 IMPLANT
CUFF TOURNIQUET SINGLE 18IN (TOURNIQUET CUFF) ×2 IMPLANT
DRAPE EXTREMITY T 121X128X90 (DRAPE) ×2 IMPLANT
DRAPE SURG 17X23 STRL (DRAPES) ×2 IMPLANT
DRSG PAD ABDOMINAL 8X10 ST (GAUZE/BANDAGES/DRESSINGS) ×2 IMPLANT
GAUZE SPONGE 4X4 12PLY STRL (GAUZE/BANDAGES/DRESSINGS) ×2 IMPLANT
GAUZE XEROFORM 1X8 LF (GAUZE/BANDAGES/DRESSINGS) ×2 IMPLANT
GLOVE BIOGEL PI IND STRL 8.5 (GLOVE) ×1 IMPLANT
GLOVE BIOGEL PI INDICATOR 8.5 (GLOVE) ×1
GLOVE SURG ORTHO 8.0 STRL STRW (GLOVE) ×2 IMPLANT
GOWN STRL REUS W/ TWL LRG LVL3 (GOWN DISPOSABLE) ×1 IMPLANT
GOWN STRL REUS W/TWL LRG LVL3 (GOWN DISPOSABLE) ×2
GOWN STRL REUS W/TWL XL LVL3 (GOWN DISPOSABLE) ×2 IMPLANT
NDL PRECISIONGLIDE 27X1.5 (NEEDLE) IMPLANT
NEEDLE PRECISIONGLIDE 27X1.5 (NEEDLE) IMPLANT
NS IRRIG 1000ML POUR BTL (IV SOLUTION) ×2 IMPLANT
PACK BASIN DAY SURGERY FS (CUSTOM PROCEDURE TRAY) ×2 IMPLANT
STOCKINETTE 4X48 STRL (DRAPES) ×2 IMPLANT
SUT ETHILON 4 0 PS 2 18 (SUTURE) ×2 IMPLANT
SUT VICRYL 4-0 PS2 18IN ABS (SUTURE) IMPLANT
SYR BULB 3OZ (MISCELLANEOUS) ×2 IMPLANT
SYR CONTROL 10ML LL (SYRINGE) IMPLANT
TOWEL OR 17X24 6PK STRL BLUE (TOWEL DISPOSABLE) ×2 IMPLANT
UNDERPAD 30X30 (UNDERPADS AND DIAPERS) ×2 IMPLANT

## 2016-12-08 NOTE — Anesthesia Postprocedure Evaluation (Signed)
Anesthesia Post Note  Patient: Grayling CongressYvonne R Prete  Procedure(s) Performed: Procedure(s) (LRB): CARPAL TUNNEL RELEASE (Right)     Patient location during evaluation: PACU Anesthesia Type: General Level of consciousness: sedated Pain management: pain level controlled Vital Signs Assessment: post-procedure vital signs reviewed and stable Respiratory status: spontaneous breathing and respiratory function stable Cardiovascular status: stable Anesthetic complications: no    Last Vitals:  Vitals:   12/08/16 0930 12/08/16 0947  BP:  (!) 144/62  Pulse: 80 73  Resp: 18   Temp:  36.4 C    Last Pain:  Vitals:   12/08/16 0748  TempSrc: Oral                 Shyan Scalisi DANIEL

## 2016-12-08 NOTE — Transfer of Care (Signed)
Immediate Anesthesia Transfer of Care Note  Patient: [name] [name]  Procedure(s) Performed: Procedure(s) with comments: CARPAL TUNNEL RELEASE (Right) - REG/FAB  Patient Location: PACU  Anesthesia Type:General  Level of Consciousness: awake and patient cooperative  Airway & Oxygen Therapy: Patient Spontanous Breathing and Patient connected to face mask oxygen  Post-op Assessment: Report given to RN and Post -op Vital signs reviewed and stable  Post vital signs: Reviewed and stable  Last Vitals:  Vitals:   12/08/16 0748 12/08/16 0908  BP: (!) 151/64 (P) 124/61  Pulse: 76   Resp: 17 (P) 19  Temp: 36.7 C (P) 36.6 C    Last Pain:  Vitals:   12/08/16 0748  TempSrc: Oral         Complications: No apparent anesthesia complications

## 2016-12-08 NOTE — Anesthesia Preprocedure Evaluation (Addendum)
Anesthesia Evaluation  Patient identified by MRN, date of birth, ID band Patient awake    Reviewed: Allergy & Precautions, NPO status , Patient's Chart, lab work & pertinent test results  Airway Mallampati: II  TM Distance: >3 FB Neck ROM: Full    Dental no notable dental hx. (+) Dental Advisory Given   Pulmonary sleep apnea and Continuous Positive Airway Pressure Ventilation , former smoker,    Pulmonary exam normal        Cardiovascular negative cardio ROS Normal cardiovascular exam     Neuro/Psych Anxiety negative neurological ROS     GI/Hepatic Neg liver ROS, GERD  ,  Endo/Other  negative endocrine ROS  Renal/GU negative Renal ROS     Musculoskeletal   Abdominal   Peds  Hematology negative hematology ROS (+)   Anesthesia Other Findings   Reproductive/Obstetrics                            Anesthesia Physical Anesthesia Plan  ASA: III  Anesthesia Plan: General   Post-op Pain Management:    Induction: Intravenous  PONV Risk Score and Plan: 2 and Ondansetron and Dexamethasone  Airway Management Planned: Natural Airway and LMA  Additional Equipment:   Intra-op Plan:   Post-operative Plan: Extubation in OR  Informed Consent: I have reviewed the patients History and Physical, chart, labs and discussed the procedure including the risks, benefits and alternatives for the proposed anesthesia with the patient or authorized representative who has indicated his/her understanding and acceptance.   Dental advisory given  Plan Discussed with: CRNA, Anesthesiologist and Surgeon  Anesthesia Plan Comments:        Anesthesia Quick Evaluation

## 2016-12-08 NOTE — Anesthesia Procedure Notes (Signed)
Procedure Name: LMA Insertion Date/Time: 12/08/2016 8:39 AM Performed by: Genevieve NorlanderLINKA, [name] [name] Pre-anesthesia Checklist: Patient identified, Emergency Drugs available, Suction available, Patient being monitored and Timeout performed Patient Re-evaluated:Patient Re-evaluated prior to induction Oxygen Delivery Method: Circle system utilized Preoxygenation: Pre-oxygenation with 100% oxygen Induction Type: IV induction Ventilation: Mask ventilation without difficulty LMA: LMA inserted LMA Size: 4.0 Number of attempts: 1 Airway Equipment and Method: Bite block Placement Confirmation: positive ETCO2 Tube secured with: Tape Dental Injury: Teeth and Oropharynx as per pre-operative assessment

## 2016-12-08 NOTE — Brief Op Note (Signed)
12/08/2016  9:03 AM  PATIENT:  [name] [name]  76 y.o. female  PRE-OPERATIVE DIAGNOSIS:  RIGHT CARPAL TUNNEL SYNDROME  POST-OPERATIVE DIAGNOSIS:  RIGHT CARPAL TUNNEL SYNDROME  PROCEDURE:  Procedure(s) with comments: CARPAL TUNNEL RELEASE (Right) - REG/FAB  SURGEON:  Surgeon(s) and Role:    Cindee Salt* Izela Altier, MD - Primary  PHYSICIAN ASSISTANT:   ASSISTANTS: none   ANESTHESIA:   local and general  EBL:  No intake/output data recorded.  BLOOD ADMINISTERED:none  DRAINS: none   LOCAL MEDICATIONS USED:  BUPIVICAINE   SPECIMEN:  No Specimen  DISPOSITION OF SPECIMEN:  N/A  COUNTS:  YES  TOURNIQUET:   Total Tourniquet Time Documented: Upper Arm (Right) - 14 minutes Total: Upper Arm (Right) - 14 minutes   DICTATION: .Other Dictation: Dictation Number [id number]  PLAN OF CARE: Discharge to home after PACU  PATIENT DISPOSITION:  PACU - hemodynamically stable.

## 2016-12-08 NOTE — Discharge Instructions (Addendum)

## 2016-12-08 NOTE — H&P (Signed)
[name] CopasYvonne R [name] is an 76 y.o. female.   Chief Complaint: numbness right hand HPI: [name] BuddyYvonne is a 76 year old former patient has not been seen in 10 years. She comes in with a complaint of catching of her right ring finger. This been going on for several months. At least 6. She states it was caused by knitting. Has a VAS score of 3-4 but she is currently constant numbness and tingling. She has had a cervical fusion in the past. 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 Does not seem to be awakened at night with it. He states that the numbness and tingling is present a considerable basis. She is not complaining of pain with it. She has been taking meloxicam without any significant relief. Has had multiple trigger digits release by myself in the past. She has not had nerve conductions done. She was sent for nerve conductions which have been done by Dr. Riccardo DubinKarvelas. This reveals a carpal tunnel syndrome on her right side negative on her left side.She has had 2 injections to the right ring finger and this has resolved her triggering.     Past Medical History:  Diagnosis Date  . Anxiety    intermittent  . blood in stool   . Diverticulosis   . Fibromyalgia   . GERD (gastroesophageal reflux disease)   . Glaucoma   . Hemorrhoids   . HSV-2 (herpes simplex virus 2) infection 2009  . OSA on CPAP 07/27/06  . Osteoporosis   . Rectal bleeding    with hemorrhoids    Past Surgical History:  Procedure Laterality Date  . BUNIONECTOMY  [id number],2002  . CERVICAL FUSION  2008  . hemorrhoids removed    . TRIGGER FINGER RELEASE  2008    Family History  Problem Relation Age of Onset  . Heart attack Father   . Hyperlipidemia Mother   . Hypertension Mother    Social History:  reports that she quit smoking about 34 years ago. She has never used smokeless tobacco. She reports that she drinks alcohol. She reports that she does not use  drugs.  Allergies:  Allergies  Allergen Reactions  . Alphagan P [Brimonidine Tartrate]     rash  . Codeine Rash  . Penicillins Rash  . Sulfur Rash    No prescriptions prior to admission.    No results found for this or any previous visit (from the past 48 hour(s)).  No results found.   Pertinent items are noted in HPI.  Height 5\' 2"  (1.575 m), weight 71.2 kg (157 lb).  General appearance: alert, cooperative and appears stated age Head: Normocephalic, without obvious abnormality Neck: no JVD Resp: clear to auscultation bilaterally Cardio: regular rate and rhythm, S1, S2 normal, no murmur, click, rub or gallop GI: soft, non-tender; bowel sounds normal; no masses,  no organomegaly Extremities: numbness right hand Pulses: 2+ and symmetric Skin: Skin color, texture, turgor normal. No rashes or lesions Neurologic: Grossly normal Incision/Wound: na  Assessment/Plan Assessment:  1. Carpal tunnel syndrome of right wrist    Plan: She states she feels at this time for the carpal tunnel to be taken care of. We have discussed surgical decompression of the median nerve right wrist. Prepare and postoperative course are discussed along with risk complications. She is where there is no guarantee to the surgery the possibility of infection recurrence injury to arteries nerves tendons complete relief symptoms dystrophy.  [name] [name] R 12/08/2016, 5:14 AM

## 2016-12-08 NOTE — Op Note (Signed)
NAMEstil Daft:  [name], [name]          ACCOUNT NO.:  [id number]  MEDICAL RECORD NO.:  [id number]  LOCATION:                                 FACILITY:  PHYSICIAN:  Cindee SaltGary Khizar Fiorella, M.D.            DATE OF BIRTH:  DATE OF PROCEDURE:  12/08/2016 DATE OF DISCHARGE:                              OPERATIVE REPORT   PREOPERATIVE DIAGNOSIS:  Carpal tunnel syndrome, right hand.  POSTOPERATIVE DIAGNOSIS:  Carpal tunnel syndrome, right hand.  OPERATION:  Decompression, right median nerve.  SURGEON:  Cindee SaltGary Omauri Boeve, M.D.  ASSISTANT:  None.  ANESTHESIA:  General.  ANESTHESIOLOGIST:  Quita SkyeJames D. Krista BlueSinger, M.D.  HISTORY:  The patient is a 76 year old female with a history of carpal tunnel syndrome.  EMG nerve conduction is positive.  This has not responded to conservative treatment.  She has elected to undergo surgical decompression to the median nerve.  Pre, peri, and postoperative course have been discussed along with risks and complications.  She is aware that there is no guarantee to the surgery; the possibility of infection; recurrence of injury to arteries, nerves, tendons; incomplete relief of symptoms and dystrophy.  In the preoperative area, the patient was seen, the extremity marked by both the patient and surgeon.  Antibiotic given.  DESCRIPTION OF PROCEDURE:  The patient was brought to the operating room where general anesthetic was carried out without difficulty under the direction of the Anesthesia Department.  She was prepped using ChloraPrep in supine position with the right arm free.  A 3-minute dry time was allowed and time-out taken, confirming the patient and the procedure.  The limb was exsanguinated with an Esmarch bandage. Tourniquet placed high on the arm was inflated to 250 mmHg.  An incision was made longitudinally in the right palm, carried down through subcutaneous tissue.  Bleeders were electrocauterized with bipolar. Palmar fascia was split.  The superficial palmar arch was  identified. The flexor tendon to the ring and little finger was identified. Retractors were placed, retracting the median nerve, flexor tendons radially and the ulnar nerve ulnarly.  An incision was then made through the flexor retinaculum on its ulnar border.  A right angle and Sewall retractor were placed between skin and forearm fascia after dissecting this free.  The underlying tissue and nerve tendons were then dissected with blunt dissection from the flexor retinaculum and the forearm fascia.  The proximal forearm flexor retinaculum and distal forearm fascia were then incised with blunt scissors, taking care to protect underlying structures.  This was done for approximately 2 to 3 cm proximal to the wrist crease under direct vision.  The canal was explored.  No further lesions were identified.  The nerve was identified and the area of compression was apparent.  The motor branch entered into muscle distally.  The wound was copiously irrigated with saline.  The skin was then closed with interrupted 4-0 nylon sutures.  Local infiltration with 0.25% bupivacaine without epinephrine was given, 9 mL was used.  A sterile compressive dressing with the fingers free was applied.  On deflation of the tourniquet, all fingers immediately pinked.  She was taken to the recovery room for observation in satisfactory condition.  She will be discharged home [facility]. She has pain medicine at home.          ______________________________ Cindee SaltGary Domonique Cothran, M.D.     GK/MEDQ  D:  12/08/2016  T:  12/08/2016  Job:  [id number]

## 2016-12-08 NOTE — Op Note (Signed)
  Dictation Number [id number]

## 2016-12-09 ENCOUNTER — Encounter (HOSPITAL_BASED_OUTPATIENT_CLINIC_OR_DEPARTMENT_OTHER): Payer: Self-pay | Admitting: Orthopedic Surgery

## 2017-01-13 ENCOUNTER — Other Ambulatory Visit: Payer: Self-pay | Admitting: Osteopathic Medicine

## 2017-01-13 DIAGNOSIS — Z1231 Encounter for screening mammogram for malignant neoplasm of breast: Secondary | ICD-10-CM

## 2017-01-19 ENCOUNTER — Ambulatory Visit (INDEPENDENT_AMBULATORY_CARE_PROVIDER_SITE_OTHER): Payer: Medicare Other | Admitting: Osteopathic Medicine

## 2017-01-19 VITALS — BP 130/80 | HR 76 | Wt 165.0 lb

## 2017-01-19 DIAGNOSIS — Z23 Encounter for immunization: Secondary | ICD-10-CM | POA: Diagnosis not present

## 2017-01-19 DIAGNOSIS — R7989 Other specified abnormal findings of blood chemistry: Secondary | ICD-10-CM

## 2017-01-19 DIAGNOSIS — R946 Abnormal results of thyroid function studies: Secondary | ICD-10-CM | POA: Diagnosis not present

## 2017-01-19 DIAGNOSIS — R0609 Other forms of dyspnea: Secondary | ICD-10-CM

## 2017-01-19 DIAGNOSIS — J309 Allergic rhinitis, unspecified: Secondary | ICD-10-CM

## 2017-01-19 DIAGNOSIS — J449 Chronic obstructive pulmonary disease, unspecified: Secondary | ICD-10-CM

## 2017-01-19 DIAGNOSIS — J432 Centrilobular emphysema: Secondary | ICD-10-CM | POA: Insufficient documentation

## 2017-01-19 MED ORDER — FLUTICASONE PROPIONATE 50 MCG/ACT NA SUSP: 2.0000 | Freq: Every day | NASAL | 11 refills | Status: DC

## 2017-01-19 NOTE — Progress Notes (Addendum)
HPI: [name] [name] is a 76 y.o. female  who presents to [facility] today, 01/19/17,  for chief complaint of:  Chief Complaint  Patient presents with  . Nasal Congestion  . Shortness of Breath    . Context: 1-2 ppd smoker x25 years quit 30 years ago  . Quality: dyspnea on exertion  . Duration: >1 year . Timing: worse with activity . Modifying factors: previously on inhaler, can't remember which one . Assoc signs/symptoms: no fever/chills, no cough. Allergies - po medications not helping, never tried nasal sprays      Past medical, surgical, social and family history reviewed: Patient Active Problem List   Diagnosis Date Noted  . Genital herpes 02/16/2016  . Trochanteric bursitis of right hip 07/27/2015  . ASCVD (arteriosclerotic cardiovascular disease) 02/05/2015  . IBS (irritable bowel syndrome) 02/05/2015  . Rectal bleeding 02/05/2015  . Pulmonary nodules 02/05/2015  . Glaucoma 01/06/2015  . Fibromyalgia 01/06/2015  . Hyperlipidemia 01/06/2015  . Obstructive sleep apnea on CPAP 01/06/2015  . Osteoporosis 01/06/2015   Past Surgical History:  Procedure Laterality Date  . BUNIONECTOMY  [id number]  . CARPAL TUNNEL RELEASE Right 12/08/2016   Procedure: CARPAL TUNNEL RELEASE;  Surgeon: Cindee Salt, MD;  Location: [facility];  Service: Orthopedics;  Laterality: Right;  REG/FAB  . CERVICAL FUSION  2008  . hemorrhoids removed    . TRIGGER FINGER RELEASE  2008   Social History  Substance Use Topics  . Smoking status: Former Smoker    Quit date: 03/07/1982  . Smokeless tobacco: Never Used  . Alcohol use 0.0 oz/week     Comment: 2 drinks per month   Family History  Problem Relation Age of Onset  . Heart attack Father   . Hyperlipidemia Mother   . Hypertension Mother      Current medication list and allergy/intolerance information reviewed:   Current Outpatient Prescriptions  Medication Sig Dispense Refill  .  acyclovir ointment (ZOVIRAX) 5 % Apply 1 application topically every 3 (three) hours. To affected area, as needed for outbreak 15 g 1  . alendronate (FOSAMAX) 70 MG tablet Take 70 mg by mouth once a week. Take with a full glass of water on an empty stomach.    Marland Kitchen aspirin 81 MG tablet Take 81 mg by mouth daily.    Marland Kitchen atorvastatin (LIPITOR) 10 MG tablet Take 1 tablet (10 mg total) by mouth daily. 90 tablet 3  . chlorhexidine (PERIDEX) 0.12 % solution Reported on 08/05/2015    . Cholecalciferol (VITAMIN D PO) Take by mouth.    . clobetasol cream (TEMOVATE) 0.05 % Apply 1 application topically 2 (two) times daily. As needed for skin irritation 30 g 0  . dorzolamide-timolol (COSOPT) 22.3-6.8 MG/ML ophthalmic solution     . DULoxetine (CYMBALTA) 30 MG capsule Take 1 capsule by mouth  daily 90 capsule 3  . hydrocortisone (ANUSOL-HC) 25 MG suppository Place 1 suppository (25 mg total) rectally 3 (three) times daily as needed for hemorrhoids or itching. 24 suppository 11  . latanoprost (XALATAN) 0.005 % ophthalmic solution     . loratadine (CLARITIN) 10 MG tablet Take 10 mg by mouth daily.    . meloxicam (MOBIC) 15 MG tablet TAKE 1/2 TO 1 TABLET BY  MOUTH DAILY AS NEEDED FOR  PAIN. 90 tablet 1  . Multiple Vitamin (MULTIVITAMINS PO) Take by mouth.    . polyethylene glycol (MIRALAX / GLYCOLAX) packet Take 17 g by mouth daily.    Marland Kitchen  timolol (BETIMOL) 0.5 % ophthalmic solution 1 drop.    . valACYclovir (VALTREX) 1000 MG tablet Take 1 tablet (1,000 mg total) by mouth 2 (two) times daily. 14 tablet 2   No current facility-administered medications for this visit.    Allergies  Allergen Reactions  . Alphagan P [Brimonidine Tartrate]     rash  . Codeine Rash  . Penicillins Rash  . Sulfur Rash      Review of Systems:  Constitutional:  No  fever, no chills, No recent illness  HEENT: No  headache, no vision change, no hearing change, No sore throat, +runny nose and sinus pressure  Cardiac: No  chest pain,  No  pressure, No palpitations, No  Orthopnea  Respiratory:  No  shortness of breath. No  Cough  Gastrointestinal: No  abdominal pain  Musculoskeletal: No new myalgia/arthralgia  Neurologic: No  weakness, No  dizziness   Exam:  BP 130/80   Pulse 76   Wt 165 lb (74.8 kg)   SpO2 98%   BMI 30.18 kg/m   Constitutional: VS see above. General Appearance: alert, well-developed, well-nourished, NAD  Ears, Nose, Mouth, Throat: MMM, Normal external inspection ears/mouth/lips/gums. Nares dry and some cracking skin.   Neck: No masses, trachea midline. No thyroid enlargement.   Respiratory: Normal respiratory effort. no wheeze, no rhonchi, no rales. Diminished breath sounds bilaterally   Cardiovascular: S1/S2 normal, no murmur, no rub/gallop auscultated. RRR. No lower extremity edema.   Musculoskeletal: Gait normal.   Neurological: Normal balance/coordination. No tremor.   Psychiatric: Normal judgment/insight. Normal mood and affect. Oriented x3.    CXR:  No results found.   EKG interpretation: Rate: 70 Rhythm: sinus No ST/T changes concerning for acute ischemia/infarct    ASSESSMENT/PLAN:   COPD suggested by initial evaluation (HCC) - Plan: DG Chest 2 View, EKG 12-Lead  Dyspnea on exertion - Plan: DG Chest 2 View, CBC, COMPLETE METABOLIC PANEL WITH GFR, TSH, B Nat Peptide  Allergic sinusitis - Plan: fluticasone (FLONASE) 50 MCG/ACT nasal spray  Flu vaccine need - Plan: Flu vaccine HIGH DOSE PF (Fluzone High dose), CANCELED: Flu Vaccine QUAD 36+ mos IM    Patient Instructions  Plan:  I suspect COPD given your description of symptoms, your smoking history, exam and previous Xrays  To diagnose and evaluate severity of COPD, lung function tests are typically performed - let's have you back in the office to get this done next week  Will get EKG and Chest Xray today as well   If lung function testing is negative (does not look like COPD), we will consider further workup  for possible heart/cardiac causes of difficulty breathing, though I think this is less likely   Recommend nasal spray for sinus symptoms   If worsening or severe shortness of breath or chest pain, please seek emergency care!     Visit summary with medication list and pertinent instructions was printed for patient to review. All questions at time of visit were answered - patient instructed to contact office with any additional concerns. ER/RTC precautions were reviewed with the patient. Follow-up plan: Return for pulmonary function testing next week .  Note: Total time spent 25 minutes, greater than 50% of the visit was spent face-to-face counseling and coordinating care for the following: The primary encounter diagnosis was COPD suggested by initial evaluation (HCC). Diagnoses of Dyspnea on exertion, Allergic sinusitis, and Flu vaccine need were also pertinent to this visit..   Addendum:  01/25/17   Results for orders placed  or performed in visit on 01/19/17 (from the past 72 hour(s))  CBC     Status: None   Collection Time: 01/24/17  3:38 PM  Result Value Ref Range   WBC 4.7 3.8 - 10.8 Thousand/uL   RBC 3.91 3.80 - 5.10 Million/uL   Hemoglobin 12.5 11.7 - 15.5 g/dL   HCT 16.1 09.6 - 04.5 %   MCV 92.3 80.0 - 100.0 fL   MCH 32.0 27.0 - 33.0 pg   MCHC 34.6 32.0 - 36.0 g/dL   RDW 40.9 81.1 - 91.4 %   Platelets 234 140 - 400 Thousand/uL   MPV 11.7 7.5 - 12.5 fL  COMPLETE METABOLIC PANEL WITH GFR     Status: None   Collection Time: 01/24/17  3:38 PM  Result Value Ref Range   Glucose, Bld 91 65 - 99 mg/dL    Comment: .            Fasting reference interval .    BUN 18 7 - 25 mg/dL   Creat 7.82 9.56 - 2.13 mg/dL    Comment: For patients >98 years of age, the reference limit for Creatinine is approximately 13% higher for people identified as African-American. .    GFR, Est Non African American 71 > OR = 60 mL/min/1.23m2   GFR, Est African American 82 > OR = 60 mL/min/1.21m2    BUN/Creatinine Ratio NOT APPLICABLE 6 - 22 (calc)   Sodium 140 135 - 146 mmol/L   Potassium 4.1 3.5 - 5.3 mmol/L   Chloride 104 98 - 110 mmol/L   CO2 28 20 - 32 mmol/L   Calcium 9.3 8.6 - 10.4 mg/dL   Total Protein 7.2 6.1 - 8.1 g/dL   Albumin 4.2 3.6 - 5.1 g/dL   Globulin 3.0 1.9 - 3.7 g/dL (calc)   AG Ratio 1.4 1.0 - 2.5 (calc)   Total Bilirubin 0.9 0.2 - 1.2 mg/dL   Alkaline phosphatase (APISO) 94 33 - 130 U/L   AST 23 10 - 35 U/L   ALT 19 6 - 29 U/L  TSH     Status: Abnormal   Collection Time: 01/24/17  3:38 PM  Result Value Ref Range   TSH 5.15 (H) 0.40 - 4.50 mIU/L  B Nat Peptide     Status: None   Collection Time: 01/24/17  3:38 PM  Result Value Ref Range   Brain Natriuretic Peptide 5 <100 pg/mL    Comment: . BNP levels increase with age in the general population with the highest values seen in individuals greater than 82 years of age. Reference: J. Am. Ladon Applebaum. Cardiol. 2002; 08:657-846. .     Ct Chest High Resolution  Result Date: 01/25/2017 CLINICAL DATA:  Chronic dyspnea. History of pulmonary nodules. Restrictive airway disease. Former smoker. EXAM: CT CHEST WITHOUT CONTRAST TECHNIQUE: Multidetector CT imaging of the chest was performed following the standard protocol without intravenous contrast. High resolution imaging of the lungs, as well as inspiratory and expiratory imaging, was performed. COMPARISON:  06/15/2016 chest radiograph.   11/13/2014 chest CT. FINDINGS: Cardiovascular: Normal heart size. No significant pericardial fluid/thickening. Right coronary atherosclerosis. Atherosclerotic nonaneurysmal thoracic aorta. Normal caliber pulmonary arteries. Mediastinum/Nodes: No discrete thyroid nodules. Unremarkable esophagus. No pathologically enlarged axillary, mediastinal or gross hilar lymph nodes, noting limited sensitivity for the detection of hilar adenopathy on this noncontrast study. Lungs/Pleura: No pneumothorax. No pleural effusion. Mild centrilobular emphysema  with mild diffuse bronchial wall thickening. Stable mild biapical pleural-parenchymal scarring with internal calcifications. Several (greater than  5) scattered small solid pulmonary nodules in both lungs measuring up to 6 mm in the anterior subpleural right lower lobe (series 5/ image 97), all stable since 09/20/2013 chest CT and considered benign. No acute consolidative airspace disease, lung masses or new significant pulmonary nodules. Mildly thickened parenchymal band in the anterior right upper lobe is stable back to 09/20/2013 and compatible with postinfectious/postinflammatory scarring. No significant lobular air trapping on the limited expiration sequence. No significant regions of subpleural reticulation, ground-glass attenuation, traction bronchiectasis, architectural distortion or frank honeycombing. Upper abdomen: Unremarkable. Musculoskeletal: No aggressive appearing focal osseous lesions. Moderate thoracic spondylosis. Partially visualized surgical hardware in the lower cervical spine from ACDF. IMPRESSION: 1. Greater than 3 year stability of scattered small solid pulmonary nodules, considered benign. 2. No evidence of interstitial lung disease. 3. Mild centrilobular emphysema with mild diffuse bronchial wall thickening, suggesting COPD. 4. One vessel coronary atherosclerosis. Aortic Atherosclerosis (ICD10-I70.0) and Emphysema (ICD10-J43.9). Electronically Signed   By: Delbert Phenix M.D.   On: 01/25/2017 13:46    Will see what pulm says.  Added T4

## 2017-01-19 NOTE — Patient Instructions (Addendum)
Plan:  I suspect COPD given your description of symptoms, your smoking history, exam and previous Xrays  To diagnose and evaluate severity of COPD, lung function tests are typically performed - let's have you back in the office to get this done next week  Will get EKG and Chest Xray today as well   If lung function testing is negative (does not look like COPD), we will consider further workup for possible heart/cardiac causes of difficulty breathing, though I think this is less likely   Recommend nasal spray for sinus symptoms   If worsening or severe shortness of breath or chest pain, please seek emergency care!

## 2017-01-24 ENCOUNTER — Ambulatory Visit (INDEPENDENT_AMBULATORY_CARE_PROVIDER_SITE_OTHER): Payer: Medicare Other | Admitting: Osteopathic Medicine

## 2017-01-24 VITALS — BP 125/74 | HR 81 | Resp 17 | Ht 62.0 in | Wt 164.0 lb

## 2017-01-24 DIAGNOSIS — R911 Solitary pulmonary nodule: Secondary | ICD-10-CM

## 2017-01-24 DIAGNOSIS — J849 Interstitial pulmonary disease, unspecified: Secondary | ICD-10-CM | POA: Diagnosis not present

## 2017-01-24 DIAGNOSIS — J984 Other disorders of lung: Secondary | ICD-10-CM

## 2017-01-24 DIAGNOSIS — R0602 Shortness of breath: Secondary | ICD-10-CM | POA: Diagnosis not present

## 2017-01-24 DIAGNOSIS — R918 Other nonspecific abnormal finding of lung field: Secondary | ICD-10-CM

## 2017-01-24 MED ORDER — ALBUTEROL SULFATE (2.5 MG/3ML) 0.083% IN NEBU
2.5000 mg | INHALATION_SOLUTION | Freq: Once | RESPIRATORY_TRACT | Status: AC
  Administered 2017-01-24: 2.5 mg via RESPIRATORY_TRACT

## 2017-01-24 NOTE — Progress Notes (Signed)
HPI: [name] [name] is a 76 y.o. female  who presents to [name] today, 01/24/17,  for chief complaint of:  Chief Complaint  Patient presents with  . Shortness of Breath    Dyspnea . Context: 1-2 ppd smoker x25 years quit 30 years ago  . Quality: dyspnea on exertion  . Duration: >1 year . Timing: worse with activity . Modifying factors: previously on inhaler, can't remember which one . Assoc signs/symptoms: no fever/chills, no cough. Allergies - po medications not helping, never tried nasal sprays   Spirometry today, see below.      Past medical, surgical, social and family history reviewed: Patient Active Problem List   Diagnosis Date Noted  . COPD suggested by initial evaluation (HCC) 01/19/2017  . 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  . Trigger ring finger of right hand 08/12/2016  . Genital herpes 02/16/2016  . Trochanteric bursitis of right hip 07/27/2015  . ASCVD (arteriosclerotic cardiovascular disease) 02/05/2015  . IBS (irritable bowel syndrome) 02/05/2015  . Rectal bleeding 02/05/2015  . Pulmonary nodules 02/05/2015  . Glaucoma 01/06/2015  . Fibromyalgia 01/06/2015  . Hyperlipidemia 01/06/2015  . Obstructive sleep apnea on CPAP 01/06/2015  . Osteoporosis 01/06/2015   Past Surgical History:  Procedure Laterality Date  . BUNIONECTOMY  [id number]  . CARPAL TUNNEL RELEASE Right 12/08/2016   Procedure: CARPAL TUNNEL RELEASE;  Surgeon: Cindee Salt, MD;  Location: [facility];  Service: Orthopedics;  Laterality: Right;  REG/FAB  . CERVICAL FUSION  2008  . hemorrhoids removed    . TRIGGER FINGER RELEASE  2008   Social History  Substance Use Topics  . Smoking status: Former Smoker    Quit date: 03/07/1982  . Smokeless tobacco: Never Used  . Alcohol use 0.0 oz/week     Comment: 2 drinks per month   Family History  Problem Relation Age of Onset  . Heart attack Father   . Hyperlipidemia  Mother   . Hypertension Mother      Current medication list and allergy/intolerance information reviewed:   Current Outpatient Prescriptions  Medication Sig Dispense Refill  . acyclovir ointment (ZOVIRAX) 5 % Apply 1 application topically every 3 (three) hours. To affected area, as needed for outbreak 15 g 1  . aspirin 81 MG tablet Take 81 mg by mouth daily.    Marland Kitchen atorvastatin (LIPITOR) 10 MG tablet Take 1 tablet (10 mg total) by mouth daily. 90 tablet 3  . chlorhexidine (PERIDEX) 0.12 % solution Reported on 08/05/2015    . Cholecalciferol (VITAMIN D PO) Take by mouth.    . clobetasol cream (TEMOVATE) 0.05 % Apply 1 application topically 2 (two) times daily. As needed for skin irritation 30 g 0  . dorzolamide-timolol (COSOPT) 22.3-6.8 MG/ML ophthalmic solution     . DULoxetine (CYMBALTA) 30 MG capsule Take 1 capsule by mouth  daily 90 capsule 3  . fluticasone (FLONASE) 50 MCG/ACT nasal spray Place 2 sprays into both nostrils daily. 16 g 11  . hydrocortisone (ANUSOL-HC) 25 MG suppository Place 1 suppository (25 mg total) rectally 3 (three) times daily as needed for hemorrhoids or itching. 24 suppository 11  . latanoprost (XALATAN) 0.005 % ophthalmic solution     . loratadine (CLARITIN) 10 MG tablet Take 10 mg by mouth daily.    . meloxicam (MOBIC) 15 MG tablet TAKE 1/2 TO 1 TABLET BY  MOUTH DAILY AS NEEDED FOR  PAIN. 90 tablet 1  . Multiple Vitamin (MULTIVITAMINS PO) Take  by mouth.    . polyethylene glycol (MIRALAX / GLYCOLAX) packet Take 17 g by mouth daily.    . timolol (BETIMOL) 0.5 % ophthalmic solution 1 drop.    . valACYclovir (VALTREX) 1000 MG tablet Take 1 tablet (1,000 mg total) by mouth 2 (two) times daily. 14 tablet 2   Current Facility-Administered Medications  Medication Dose Route Frequency Provider Last Rate Last Dose  . albuterol (PROVENTIL) (2.5 MG/3ML) 0.083% nebulizer solution 2.5 mg  2.5 mg Nebulization Once Sunnie Nielsen, DO       Allergies  Allergen Reactions  .  Alphagan P [Brimonidine Tartrate]     rash  . Codeine Rash  . Penicillins Rash  . Sulfur Rash      Review of Systems:  Constitutional:  No  fever, no chills, No recent illness  HEENT: No  headache, no vision change, no hearing change, No sore throat, +runny nose and sinus pressure  Cardiac: No  chest pain, No  pressure, No palpitations, No  Orthopnea  Respiratory:  +shortness of breath. No  Cough  Neurologic: No  weakness, No  dizziness   Exam:  BP 125/74   Pulse 81   Resp 17   Ht  (1.575 m)   Wt 164 lb (74.4 kg)   SpO2 97%   BMI 30.00 kg/m   Constitutional: VS see above. General Appearance: alert, well-developed, well-nourished, NAD  Ears, Nose, Mouth, Throat: MMM, Normal external inspection ears/mouth/lips/gums. Nares dry and some cracking skin.   Neck: No masses, trachea midline. No thyroid enlargement.   Respiratory: Normal respiratory effort. no wheeze, no rhonchi, no rales. Diminished breath sounds bilaterally   Cardiovascular: S1/S2 normal, no murmur, no rub/gallop auscultated. RRR. No lower extremity edema.   Musculoskeletal: Gait normal.   Neurological: Normal balance/coordination. No tremor.   Psychiatric: Normal judgment/insight. Normal mood and affect. Oriented x3.    PFT INTERPRETATION  FEV1/FVC >70 *AND* FEV1 >80% predicted - No = abnormal  1. Valid study? yes 2. Flow-Volume Loop: borderline 3. FEV1/FVC: >70  >70 = Normal  <70 = Obstructive    = COPD no 3. Severity of Obstruction ~ Post-Bronchodilator FEV1: n/a 5. Bronchodilator Challenge  Increase FEV1 or FVC by 200+mL? no *AND*  Increased same FEV1 or FVC by 12+%? no  Reversible? no 6. FVC <80% = Restrictive yes 7. Lung Volumes unable to test here 8. Diffusion Capacity - refer to Pulm needed? 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 - Plan: Ambulatory referral to Pulmonology, CT CHEST HIGH RESOLUTION  Shortness of breath - Consider cardiac  w/u as well, pending pulmonology recs - Plan: Spirometry: Pre & Post Eval, albuterol (PROVENTIL) (2.5 MG/3ML) 0.083% nebulizer solution 2.5 mg, Ambulatory referral to Pulmonology, CT CHEST HIGH RESOLUTION, CANCELED: CT Chest Wo Contrast  Pulmonary nodules - Plan: Ambulatory referral to Pulmonology, CT CHEST HIGH RESOLUTION, CANCELED: CT Chest Wo Contrast  Lung nodule - Plan: CT CHEST HIGH RESOLUTION, CANCELED: CT Chest Wo Contrast  Abnormal findings on diagnostic imaging of lung - Plan: CT CHEST HIGH RESOLUTION, CANCELED: CT Chest Wo Contrast  Interstitial pulmonary disease (HCC) - Plan: CT CHEST HIGH RESOLUTION    Patient Instructions  Your lung testing indicates likely Restrictive lung disease - which is more often a problem of scarred/stiff or damaged lung tissue, though other causes are possible (as opposed to Obstructive lung disease, which includes COPD/emphysema and asthma). The way to determine the cause and therefore treatment is  further testing with a pulmonologist. A referral was placed to send you to this specialist.   Given history of lung nodules, we are also going to repeat a CT of the chest without contrast for further evaluation.     Visit summary with medication list and pertinent instructions was printed for patient to review. All questions at time of visit were answered - patient instructed to contact office with any additional concerns. ER/RTC precautions were reviewed with the patient. Follow-up plan: Return for ANNUAL PHYSICAL within next 6 or so months, sooner if needed.  Note: Total time spent 25 minutes, greater than 50% of the visit was spent face-to-face counseling and coordinating care for the following: The primary encounter diagnosis was Shortness of breath. Diagnoses of Pulmonary nodules, Lung nodule, Abnormal findings on diagnostic imaging of lung, Restrictive airway disease, and Interstitial pulmonary disease (HCC) were also pertinent to this  visit..     ADDENDUM: 01/25/17 No significant anemia, will get additional testing for abn TSH   Results for orders placed or performed in visit on 01/19/17 (from the past 24 hour(s))  CBC     Status: None   Collection Time: 01/24/17  3:38 PM  Result Value Ref Range   WBC 4.7 3.8 - 10.8 Thousand/uL   RBC 3.91 3.80 - 5.10 Million/uL   Hemoglobin 12.5 11.7 - 15.5 g/dL   HCT 52.8 41.3 - 24.4 %   MCV 92.3 80.0 - 100.0 fL   MCH 32.0 27.0 - 33.0 pg   MCHC 34.6 32.0 - 36.0 g/dL   RDW 01.0 27.2 - 53.6 %   Platelets 234 140 - 400 Thousand/uL   MPV 11.7 7.5 - 12.5 fL  COMPLETE METABOLIC PANEL WITH GFR     Status: None   Collection Time: 01/24/17  3:38 PM  Result Value Ref Range   Glucose, Bld 91 65 - 99 mg/dL   BUN 18 7 - 25 mg/dL   Creat 6.44 0.34 - 7.42 mg/dL   GFR, Est Non African American 71 > OR = 60 mL/min/1.32m2   GFR, Est African American 82 > OR = 60 mL/min/1.63m2   BUN/Creatinine Ratio NOT APPLICABLE 6 - 22 (calc)   Sodium 140 135 - 146 mmol/L   Potassium 4.1 3.5 - 5.3 mmol/L   Chloride 104 98 - 110 mmol/L   CO2 28 20 - 32 mmol/L   Calcium 9.3 8.6 - 10.4 mg/dL   Total Protein 7.2 6.1 - 8.1 g/dL   Albumin 4.2 3.6 - 5.1 g/dL   Globulin 3.0 1.9 - 3.7 g/dL (calc)   AG Ratio 1.4 1.0 - 2.5 (calc)   Total Bilirubin 0.9 0.2 - 1.2 mg/dL   Alkaline phosphatase (APISO) 94 33 - 130 U/L   AST 23 10 - 35 U/L   ALT 19 6 - 29 U/L  TSH     Status: Abnormal   Collection Time: 01/24/17  3:38 PM  Result Value Ref Range   TSH 5.15 (H) 0.40 - 4.50 mIU/L   CT chest: awaiting results  No results found.

## 2017-01-24 NOTE — Patient Instructions (Addendum)
Your lung testing indicates likely Restrictive lung disease - which is more often a problem of scarred/stiff or damaged lung tissue, though other causes are possible (as opposed to Obstructive lung disease, which includes COPD/emphysema and asthma). The way to determine the cause and therefore treatment is further testing with a pulmonologist. A referral was placed to send you to this specialist.   Given history of lung nodules, we are also going to repeat a CT of the chest without contrast for further evaluation.

## 2017-01-25 ENCOUNTER — Ambulatory Visit (INDEPENDENT_AMBULATORY_CARE_PROVIDER_SITE_OTHER): Payer: Medicare Other

## 2017-01-25 DIAGNOSIS — J439 Emphysema, unspecified: Secondary | ICD-10-CM | POA: Diagnosis not present

## 2017-01-25 DIAGNOSIS — I700 Atherosclerosis of aorta: Secondary | ICD-10-CM | POA: Diagnosis not present

## 2017-01-25 LAB — COMPLETE METABOLIC PANEL WITH GFR
AG RATIO: 1.4 (calc) (ref 1.0–2.5)
ALT: 19 U/L (ref 6–29)
AST: 23 U/L (ref 10–35)
Albumin: 4.2 g/dL (ref 3.6–5.1)
Alkaline phosphatase (APISO): 94 U/L (ref 33–130)
BILIRUBIN TOTAL: 0.9 mg/dL (ref 0.2–1.2)
BUN: 18 mg/dL (ref 7–25)
CHLORIDE: 104 mmol/L (ref 98–110)
CO2: 28 mmol/L (ref 20–32)
Calcium: 9.3 mg/dL (ref 8.6–10.4)
Creat: 0.8 mg/dL (ref 0.60–0.93)
GFR, EST AFRICAN AMERICAN: 82 mL/min/{1.73_m2} (ref >=60)
GFR, Est Non African American: 71 mL/min/{1.73_m2} (ref >=60)
Globulin: 3 g/dL (calc) (ref 1.9–3.7)
Glucose, Bld: 91 mg/dL (ref 65–99)
POTASSIUM: 4.1 mmol/L (ref 3.5–5.3)
Sodium: 140 mmol/L (ref 135–146)
TOTAL PROTEIN: 7.2 g/dL (ref 6.1–8.1)

## 2017-01-25 LAB — TEST AUTHORIZATION

## 2017-01-25 LAB — CBC
HCT: 36.1 % (ref 35.0–45.0)
Hemoglobin: 12.5 g/dL (ref 11.7–15.5)
MCH: 32 pg (ref 27.0–33.0)
MCHC: 34.6 g/dL (ref 32.0–36.0)
MCV: 92.3 fL (ref 80.0–100.0)
MPV: 11.7 fL (ref 7.5–12.5)
PLATELETS: 234 10*3/uL (ref 140–400)
RBC: 3.91 10*6/uL (ref 3.80–5.10)
RDW: 12.5 % (ref 11.0–15.0)
WBC: 4.7 10*3/uL (ref 3.8–10.8)

## 2017-01-25 LAB — BRAIN NATRIURETIC PEPTIDE: Brain Natriuretic Peptide: 5 pg/mL (ref <100)

## 2017-01-25 LAB — TSH: TSH: 5.15 mIU/L — ABNORMAL HIGH (ref 0.40–4.50)

## 2017-01-25 LAB — T4, FREE: Free T4: 1.1 ng/dL (ref 0.8–1.8)

## 2017-01-25 NOTE — Addendum Note (Signed)
Addended by: Deirdre Pippins on: 01/25/2017 02:36 PM   Modules accepted: Orders

## 2017-02-03 ENCOUNTER — Ambulatory Visit (INDEPENDENT_AMBULATORY_CARE_PROVIDER_SITE_OTHER): Payer: Medicare Other

## 2017-02-03 DIAGNOSIS — Z1231 Encounter for screening mammogram for malignant neoplasm of breast: Secondary | ICD-10-CM | POA: Diagnosis not present

## 2017-02-14 ENCOUNTER — Other Ambulatory Visit: Payer: Self-pay | Admitting: Osteopathic Medicine

## 2017-02-14 DIAGNOSIS — M25551 Pain in right hip: Secondary | ICD-10-CM

## 2017-02-16 ENCOUNTER — Encounter: Payer: Self-pay | Admitting: Emergency Medicine

## 2017-02-16 ENCOUNTER — Ambulatory Visit (INDEPENDENT_AMBULATORY_CARE_PROVIDER_SITE_OTHER): Payer: Medicare Other | Admitting: Emergency Medicine

## 2017-02-16 DIAGNOSIS — Z9989 Dependence on other enabling machines and devices: Secondary | ICD-10-CM

## 2017-02-16 DIAGNOSIS — R918 Other nonspecific abnormal finding of lung field: Secondary | ICD-10-CM

## 2017-02-16 DIAGNOSIS — J449 Chronic obstructive pulmonary disease, unspecified: Secondary | ICD-10-CM | POA: Diagnosis not present

## 2017-02-16 DIAGNOSIS — G4733 Obstructive sleep apnea (adult) (pediatric): Secondary | ICD-10-CM | POA: Diagnosis not present

## 2017-02-16 MED ORDER — TIOTROPIUM BROMIDE-OLODATEROL 2.5-2.5 MCG/ACT IN AERS: 2.0000 | INHALATION_SPRAY | Freq: Every day | RESPIRATORY_TRACT | 0 refills | Status: DC

## 2017-02-16 NOTE — Progress Notes (Signed)
Patient seen in the office today and instructed on use of Stiolto.  Patient expressed understanding and demonstrated technique. 

## 2017-02-16 NOTE — Assessment & Plan Note (Signed)
We will start Stiolto 2 puffs once a day for the next several weeks to see if you benefit.  We will perform full pulmonary function testing  Follow with Dr Delton Coombes in about one month to discuss whether you have benefited on the new medication Flu shot up to date

## 2017-02-16 NOTE — Progress Notes (Signed)
Subjective:    Patient ID: [name] [name], female    DOB: Apr 01, 1941, 76 y.o.   MRN: [id number]  HPI 76 year old former smoker (with a history of fibromyalgia, GERD, osteoporosis, obstructive sleep apnea on CPAP. She is referred today for evaluation of dyspnea and also pulmonary nodules that have been stable on serial CT scans back to 09/2013. She has good compliance with her nasal pillows / CPAP.   She reports progressive exertional SOB over last 2 years. She has rhinitis that is better on loratadine, was recently started on fluticasone NS as well > seems to be helping her. She and her daughters have heard some wheezing. She coughs . She had spirometry at her PCP 01/24/17 >> FEV1 0.79 L (43% predicted), FVC 1.21 L (49% predicted). She benefits from CPAP > less sleepy, more energy.    Review of Systems  Constitutional: Negative for fever and unexpected weight change.  HENT: Negative for congestion, dental problem, ear pain, nosebleeds, postnasal drip, rhinorrhea, sinus pressure, sneezing, sore throat and trouble swallowing.   Eyes: Negative for redness and itching.  Respiratory: Positive for cough, chest tightness, shortness of breath and wheezing.   Cardiovascular: Negative for palpitations and leg swelling.  Gastrointestinal: Negative for nausea and vomiting.  Genitourinary: Negative for dysuria.  Musculoskeletal: Negative for joint swelling.  Skin: Negative for rash.  Neurological: Negative for headaches.  Hematological: Does not bruise/bleed easily.  Psychiatric/Behavioral: Negative for dysphoric mood. The patient is not nervous/anxious.    Past Medical History:  Diagnosis Date  . Anxiety    intermittent  . blood in stool   . Diverticulosis   . Fibromyalgia   . GERD (gastroesophageal reflux disease)   . Glaucoma   . Hemorrhoids   . HSV-2 (herpes simplex virus 2) infection 2009  . OSA on CPAP 07/27/06  . Osteoporosis   . Rectal bleeding    with hemorrhoids     Family  History  Problem Relation Age of Onset  . Heart attack Father   . Hyperlipidemia Mother   . Hypertension Mother      Social History   Social History  . Marital status: Married    Spouse name: N/A  . Number of children: N/A  . Years of education: N/A   Occupational History  . Not on file.   Social History Main Topics  . Smoking status: Former Smoker    Packs/day: 2.00    Years: 25.00    Quit date: 03/07/1982  . Smokeless tobacco: Never Used  . Alcohol use 0.0 oz/week     Comment: 2 drinks per month  . Drug use: No  . Sexual activity: No   Other Topics Concern  . Not on file   Social History Narrative   Tobacco use cigarettes: former smoker, tobacco history last updated 10/28/2013. No smoking. No tobacco exposure, no alcohol. No caffeine. No recreational drug use. Exercise: yes, some walking-not much. 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 Marital status: married.  from [place], [place], [place], [place] Has worked Armed forces operational officer, an Airline pilot.    Allergies  Allergen Reactions  . Alphagan P [Brimonidine Tartrate]     rash  . Codeine Rash  . Penicillins Rash  . Sulfur Rash     Outpatient Medications Prior to Visit  Medication Sig Dispense Refill  . acyclovir ointment (ZOVIRAX) 5 % Apply 1 application topically every 3 (three) hours. To affected area, as needed for outbreak 15 g 1  . aspirin 81 MG tablet Take  81 mg by mouth daily.    Marland Kitchen atorvastatin (LIPITOR) 10 MG tablet Take 1 tablet (10 mg total) by mouth daily. 90 tablet 3  . Cholecalciferol (VITAMIN D PO) Take by mouth.    . clobetasol cream (TEMOVATE) 0.05 % Apply 1 application topically 2 (two) times daily. As needed for skin irritation 30 g 0  . DULoxetine (CYMBALTA) 30 MG capsule TAKE 1 CAPSULE BY MOUTH  DAILY 90 capsule 0  . fluticasone (FLONASE) 50 MCG/ACT nasal spray Place 2 sprays into both nostrils daily. 16 g 11  . hydrocortisone (ANUSOL-HC) 25 MG suppository Place 1 suppository  (25 mg total) rectally 3 (three) times daily as needed for hemorrhoids or itching. 24 suppository 11  . latanoprost (XALATAN) 0.005 % ophthalmic solution     . loratadine (CLARITIN) 10 MG tablet Take 10 mg by mouth daily.    . meloxicam (MOBIC) 15 MG tablet TAKE 1/2 TO 1 TABLET BY  MOUTH DAILY AS NEEDED FOR  PAIN. 90 tablet 1  . Multiple Vitamin (MULTIVITAMINS PO) Take by mouth.    . polyethylene glycol (MIRALAX / GLYCOLAX) packet Take 17 g by mouth daily.    . timolol (BETIMOL) 0.5 % ophthalmic solution 1 drop.    . valACYclovir (VALTREX) 1000 MG tablet Take 1 tablet (1,000 mg total) by mouth 2 (two) times daily. 14 tablet 2  . dorzolamide-timolol (COSOPT) 22.3-6.8 MG/ML ophthalmic solution     . chlorhexidine (PERIDEX) 0.12 % solution Reported on 08/05/2015     No facility-administered medications prior to visit.         Objective:   Physical Exam Vitals:   02/16/17 1125 02/16/17 1126  BP:  138/82  Pulse:  84  SpO2:  94%  Weight: 165 lb (74.8 kg)   Height:  (1.575 m)    Gen: Pleasant, well-nourished, in no distress,  normal affect  ENT: No lesions,  mouth clear,  oropharynx clear, no postnasal drip  Neck: No JVD, no stridor  Lungs: No use of accessory muscles, Distant but clear, no wheezing  Cardiovascular: RRR, heart sounds normal, no murmur or gallops, no peripheral edema  Musculoskeletal: No deformities, no cyanosis or clubbing  Neuro: alert, non focal  Skin: Warm, no lesions or rashes     Assessment & Plan:  COPD suggested by initial evaluation (HCC) We will start Stiolto 2 puffs once a day for the next several weeks to see if you benefit.  We will perform full pulmonary function testing  Follow with Dr Delton Coombes in about one month to discuss whether you have benefited on the new medication Flu shot up to date  Obstructive sleep apnea on CPAP Continue CPAP every night, tolerating well  Pulmonary nodules Stable on serial exams over the last 3 years.  Consistent with benign etiology. She should not need any further scans to follow these  Levy Pupa, MD, PhD 02/16/2017, 12:01 PM [facility] [phone] or if no answer [phone]

## 2017-02-16 NOTE — Patient Instructions (Signed)
We will start Stiolto 2 puffs once a day for the next several weeks to see if you benefit.  We will perform full pulmonary function testing  Continue your loratadine and fluticasone nasal spray as you are using them  Follow with Dr Delton Coombes in about one month to discuss whether you have benefited on the new medication Flu shot up to date

## 2017-02-16 NOTE — Assessment & Plan Note (Signed)
Stable on serial exams over the last 3 years. Consistent with benign etiology. She should not need any further scans to follow these

## 2017-02-16 NOTE — Assessment & Plan Note (Signed)
Continue CPAP every night, tolerating well

## 2017-03-16 ENCOUNTER — Ambulatory Visit: Payer: Medicare Other | Admitting: Osteopathic Medicine

## 2017-03-16 ENCOUNTER — Encounter: Payer: Self-pay | Admitting: Osteopathic Medicine

## 2017-03-16 VITALS — BP 127/82 | HR 80 | Temp 98.0°F | Resp 16 | Wt 168.8 lb

## 2017-03-16 DIAGNOSIS — Z0000 Encounter for general adult medical examination without abnormal findings: Secondary | ICD-10-CM

## 2017-03-16 DIAGNOSIS — H9191 Unspecified hearing loss, right ear: Secondary | ICD-10-CM | POA: Diagnosis not present

## 2017-03-16 NOTE — Progress Notes (Signed)
HPI: [name] [name] is a 76 y.o. female who  has a past medical history of Anxiety, blood in stool, Diverticulosis, Fibromyalgia, GERD (gastroesophageal reflux disease), Glaucoma, Hemorrhoids, HSV-2 (herpes simplex virus 2) infection (2009), OSA on CPAP (07/27/06), Osteoporosis, and Rectal bleeding.  she [facility] today, 03/16/17,  for chief complaint of:  Chief Complaint  Patient presents with  . Ear Problem    Humming; pressure in right ear since June/July 2018    Humming sound, not really high-pitched, pt finds it difficult to describe. Constant, but at times more noticeable when quiet and in the evenings. No different with position/activity. No associated headache, dizziness, vision changes, no hearing loss.     Past medical history, surgical history, social history and family history reviewed.  Patient Active Problem List   Diagnosis Date Noted  . COPD suggested by initial evaluation (HCC) 01/19/2017  . 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  . Trigger ring finger of right hand 08/12/2016  . Genital herpes 02/16/2016  . Trochanteric bursitis of right hip 07/27/2015  . ASCVD (arteriosclerotic cardiovascular disease) 02/05/2015  . IBS (irritable bowel syndrome) 02/05/2015  . Rectal bleeding 02/05/2015  . Pulmonary nodules 02/05/2015  . Glaucoma 01/06/2015  . Fibromyalgia 01/06/2015  . Hyperlipidemia 01/06/2015  . Obstructive sleep apnea on CPAP 01/06/2015  . Osteoporosis 01/06/2015    Current medication list and allergy/intolerance information reviewed.    Current Outpatient Medications on File Prior to Visit  Medication Sig Dispense Refill  . acyclovir ointment (ZOVIRAX) 5 % Apply 1 application topically every 3 (three) hours. To affected area, as needed for outbreak 15 g 1  . aspirin 81 MG tablet Take 81 mg by mouth daily.    Marland Kitchen. atorvastatin (LIPITOR) 10 MG tablet Take 1 tablet (10 mg total) by mouth daily. 90  tablet 3  . Cholecalciferol (VITAMIN D PO) Take by mouth.    . clobetasol cream (TEMOVATE) 0.05 % Apply 1 application topically 2 (two) times daily. As needed for skin irritation 30 g 0  . DULoxetine (CYMBALTA) 30 MG capsule TAKE 1 CAPSULE BY MOUTH  DAILY 90 capsule 0  . fluticasone (FLONASE) 50 MCG/ACT nasal spray Place 2 sprays into both nostrils daily. 16 g 11  . hydrocortisone (ANUSOL-HC) 25 MG suppository Place 1 suppository (25 mg total) rectally 3 (three) times daily as needed for hemorrhoids or itching. 24 suppository 11  . latanoprost (XALATAN) 0.005 % ophthalmic solution     . loratadine (CLARITIN) 10 MG tablet Take 10 mg by mouth daily.    . meloxicam (MOBIC) 15 MG tablet TAKE 1/2 TO 1 TABLET BY  MOUTH DAILY AS NEEDED FOR  PAIN. 90 tablet 1  . Multiple Vitamin (MULTIVITAMINS PO) Take by mouth.    . polyethylene glycol (MIRALAX / GLYCOLAX) packet Take 17 g by mouth daily.    . timolol (BETIMOL) 0.5 % ophthalmic solution 1 drop.    . Tiotropium Bromide-Olodaterol (STIOLTO RESPIMAT) 2.5-2.5 MCG/ACT AERS Inhale 2 puffs into the lungs daily. 2 Inhaler 0  . valACYclovir (VALTREX) 1000 MG tablet Take 1 tablet (1,000 mg total) by mouth 2 (two) times daily. 14 tablet 2   No current facility-administered medications on file prior to visit.    Allergies  Allergen Reactions  . Alphagan P [Brimonidine Tartrate]     rash  . Codeine Rash  . Penicillins Rash  . Sulfur Rash      Review of Systems:  Constitutional: No recent illness  HEENT: No  headache, no vision change  Cardiac: No  chest pain, No  pressure  Respiratory:  No  shortness of breath.  Neurologic: No  weakness, No  Dizziness   Exam:  BP 127/82 (BP Location: Left Arm, Patient Position: Sitting, Cuff Size: Large)   Pulse 80   Temp 98 F (36.7 C) (Oral)   Resp 16   Wt 168 lb 12.8 oz (76.6 kg)   SpO2 96%   BMI 30.87 kg/m   Constitutional: VS see above. General Appearance: alert, well-developed, well-nourished,  NAD  Eyes: Normal lids and conjunctive, non-icteric sclera  Ears, Nose, Mouth, Throat: MMM, Normal external inspection ears/nares/mouth/lips/gums. TM wnl bilaterally, normal jaw motion without pain/clicking   Neck: No masses, trachea midline. No lymphadenopathy  Respiratory: Normal respiratory effort. no wheeze, no rhonchi, no rales  Cardiovascular: S1/S2 normal, no murmur, no rub/gallop auscultated. RRR.   Musculoskeletal: Gait normal. Symmetric and independent movement of all extremities  Neurological: Normal balance/coordination. No tremor.  Skin: warm, dry, intact.   Psychiatric: Normal judgment/insight. Normal mood and affect. Oriented x3.       ASSESSMENT/PLAN:   Change in hearing of right ear - No pulsatile character or change w/ position/activity which would concern for vascular cause, no dizziness/vertigo, no hearing loss. Not fitting classic tinnitus, will send to ENT for 2nd opinion and see if any audiology evaluation or imaging is needed - Plan: Ambulatory referral to ENT  Annual physical exam - Plan: CBC, COMPLETE METABOLIC PANEL WITH GFR, Lipid panel, TSH, T4, free, VITAMIN D 25 Hydroxy (Vit-D Deficiency, Fractures) Labs ordered for annual physical, future visit. Annual preventive care was not performed or billed today     Follow-up plan: Return if symptoms worsen or fail to improve.  Visit summary with medication list and pertinent instructions was printed for patient to review, alert us if any changes needed. All questions at time of visit were answered - patient instructed to contact office with any additional concerns. ER/RTC precautions were reviewed with the patient and understanding verbalized.   Please note: voice recognition software was used to produce this document, and typos may escape review. Please contact Dr. Lyn HollingsheadAlexander for any needed clarifications.

## 2017-03-17 ENCOUNTER — Telehealth: Payer: Self-pay | Admitting: Emergency Medicine

## 2017-03-17 MED ORDER — TIOTROPIUM BROMIDE-OLODATEROL 2.5-2.5 MCG/ACT IN AERS: 2.0000 | INHALATION_SPRAY | Freq: Every day | RESPIRATORY_TRACT | 3 refills | Status: DC

## 2017-03-17 NOTE — Telephone Encounter (Signed)
Spoke with pt and advised rx sent to pharmacy. Nothing further is needed.   

## 2017-04-01 ENCOUNTER — Encounter: Payer: Self-pay | Admitting: Osteopathic Medicine

## 2017-04-07 ENCOUNTER — Ambulatory Visit: Payer: Medicare Other | Admitting: Emergency Medicine

## 2017-04-07 ENCOUNTER — Ambulatory Visit (INDEPENDENT_AMBULATORY_CARE_PROVIDER_SITE_OTHER): Payer: Medicare Other | Admitting: Emergency Medicine

## 2017-04-07 ENCOUNTER — Encounter: Payer: Self-pay | Admitting: Emergency Medicine

## 2017-04-07 DIAGNOSIS — Z9989 Dependence on other enabling machines and devices: Secondary | ICD-10-CM | POA: Diagnosis not present

## 2017-04-07 DIAGNOSIS — R918 Other nonspecific abnormal finding of lung field: Secondary | ICD-10-CM

## 2017-04-07 DIAGNOSIS — J984 Other disorders of lung: Secondary | ICD-10-CM | POA: Diagnosis not present

## 2017-04-07 DIAGNOSIS — J449 Chronic obstructive pulmonary disease, unspecified: Secondary | ICD-10-CM | POA: Diagnosis not present

## 2017-04-07 DIAGNOSIS — G4733 Obstructive sleep apnea (adult) (pediatric): Secondary | ICD-10-CM

## 2017-04-07 LAB — PULMONARY FUNCTION TEST
DL/VA % pred: 87 %
DL/VA: 4.11 ml/min/mmHg/L
DLCO cor % pred: 57 %
DLCO cor: 13.27 ml/min/mmHg
DLCO unc % pred: 57 %
DLCO unc: 13.23 ml/min/mmHg
FEF 25-75 Post: 0.89 L/sec
FEF 25-75 Pre: 0.73 L/sec
FEF2575-%Change-Post: 22 %
FEF2575-%Pred-Post: 61 %
FEF2575-%Pred-Pre: 50 %
FEV1-%Change-Post: 6 %
FEV1-%Pred-Post: 60 %
FEV1-%Pred-Pre: 57 %
FEV1-Post: 1.16 L
FEV1-Pre: 1.09 L
FEV1FVC-%Change-Post: 5 %
FEV1FVC-%Pred-Pre: 97 %
FEV6-%Change-Post: 1 %
FEV6-%Pred-Post: 63 %
FEV6-%Pred-Pre: 62 %
FEV6-Post: 1.54 L
FEV6-Pre: 1.52 L
FEV6FVC-%Pred-Post: 105 %
FEV6FVC-%Pred-Pre: 105 %
FVC-%Change-Post: 1 %
FVC-%Pred-Post: 59 %
FVC-%Pred-Pre: 59 %
FVC-Post: 1.54 L
FVC-Pre: 1.52 L
Post FEV1/FVC ratio: 76 %
Post FEV6/FVC ratio: 100 %
Pre FEV1/FVC ratio: 72 %
Pre FEV6/FVC Ratio: 100 %
RV % pred: 114 %
RV: 2.62 L
TLC % pred: 85 %
TLC: 4.18 L

## 2017-04-07 MED ORDER — ALBUTEROL SULFATE HFA 108 (90 BASE) MCG/ACT IN AERS: 2.0000 | INHALATION_SPRAY | RESPIRATORY_TRACT | 5 refills | Status: DC | PRN

## 2017-04-07 NOTE — Progress Notes (Signed)
PFT done today. 

## 2017-04-07 NOTE — Assessment & Plan Note (Signed)
Likely due to weight gain and deconditioning.  I supported [facility] and [facility] program.

## 2017-04-07 NOTE — Patient Instructions (Signed)
We will stop Stiolto for now.  Depending on how your breathing progresses and how you feel off the medication in the future we may decide to start an alternative. Use your CPAP machine every night Walking oximetry today on room air Use albuterol 2 puffs up to every 4 hours if needed for shortness of breath, wheezing, chest tightness.  Follow with Dr Delton CoombesByrum in 6 months or sooner if you have any problems

## 2017-04-07 NOTE — Assessment & Plan Note (Signed)
Airflow obstruction confirmed on her pulmonary function testing today.  Mixed obstruction and restriction.  Surprisingly she did not benefit very much from [facility], has been taking it for over a month.  Given her failure to respond I will stop the medication today.  She will make note of whether she misses it in retrospect.  We will use albuterol as needed.  Will reconsider restarting schedule bronchodilator at some point in the future depending on her symptoms.

## 2017-04-07 NOTE — Assessment & Plan Note (Signed)
Stable on serial exams.  She does not need follow-up CT

## 2017-04-07 NOTE — Assessment & Plan Note (Signed)
Continue CPAP every night. 

## 2017-04-07 NOTE — Progress Notes (Signed)
Subjective:    Patient ID: [name] [name], female    DOB: [birth year], 76 y.o.   MRN: [id number]  HPI 76 year old former smoker (with a history of fibromyalgia, GERD, osteoporosis, obstructive sleep apnea on CPAP. She is referred today for evaluation of dyspnea and also pulmonary nodules that have been stable on serial CT scans back to 09/2013. She has good compliance with her nasal pillows / CPAP.   She reports progressive exertional SOB over last 2 years. She has rhinitis that is better on loratadine, was recently started on fluticasone NS as well > seems to be helping her. She and her daughters have heard some wheezing. She coughs . She had spirometry at her PCP 01/24/17 >> FEV1 0.79 L (43% predicted), FVC 1.21 L (49% predicted). She benefits from CPAP > less sleepy, more energy.   ROV 04/07/17 --patient follows up today for evaluation of shortness of breath.  She has a history of obstructive sleep apnea on CPAP.  Spirometry in September 2018 suggested obstructive lung disease.  At our initial visit we started Stiolto to see if she would benefit.  She reports that the stiolto hasn't changed very much - she still feels dyspnea with home chores. PFT today reviewed by me, spirometry shows evidence for mixed obstruction and restriction without a bronchodilator response, lung volumes are normal diffusion capacity is slightly decreased and corrects to the normal range when adjusted for alveolar volume. She does have to stop to rest w activity, still able to do her daily activities.    Review of Systems  Constitutional: Negative for fever and unexpected weight change.  HENT: Negative for congestion, dental problem, ear pain, nosebleeds, postnasal drip, rhinorrhea, sinus pressure, sneezing, sore throat and trouble swallowing.   Eyes: Negative for redness and itching.  Respiratory: Positive for cough, chest tightness, shortness of breath and wheezing.   Cardiovascular: Negative for palpitations and  leg swelling.  Gastrointestinal: Negative for nausea and vomiting.  Genitourinary: Negative for dysuria.  Musculoskeletal: Negative for joint swelling.  Skin: Negative for rash.  Neurological: Negative for headaches.  Hematological: Does not bruise/bleed easily.  Psychiatric/Behavioral: Negative for dysphoric mood. The patient is not nervous/anxious.    Past Medical History:  Diagnosis Date  . Anxiety    intermittent  . blood in stool   . Diverticulosis   . Fibromyalgia   . GERD (gastroesophageal reflux disease)   . Glaucoma   . Hemorrhoids   . HSV-2 (herpes simplex virus 2) infection 2009  . OSA on CPAP 07/27/06  . Osteoporosis   . Rectal bleeding    with hemorrhoids     Family History  Problem Relation Age of Onset  . Heart attack Father   . Hyperlipidemia Mother   . Hypertension Mother      Social History   Socioeconomic History  . Marital status: Married    Spouse name: Not on file  . Number of children: Not on file  . Years of education: Not on file  . Highest education level: Not on file  Social Needs  . Financial resource strain: Not on file  . Food insecurity - worry: Not on file  . Food insecurity - inability: Not on file  . Transportation needs - medical: Not on file  . Transportation needs - non-medical: Not on file  Occupational History  . Not on file  Tobacco Use  . Smoking status: Former Smoker    Packs/day: 2.00    Years: 25.00    Pack years:  50.00    Last attempt to quit: 03/07/1981    Years since quitting: 36.1  . Smokeless tobacco: Never Used  Substance and Sexual Activity  . Alcohol use: Yes    Alcohol/week: 0.0 oz    Comment: 2 drinks per month  . Drug use: No  . Sexual activity: No  Other Topics Concern  . Not on file  Social History Narrative   Tobacco use cigarettes: former smoker, tobacco history last updated 10/28/2013. No smoking. No tobacco exposure, no alcohol. No caffeine. No recreational drug use. Exercise: yes, some  walking-not much. 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 Marital status: married.  from [place], [place], [place], [place] Has worked Armed forces operational officerdental hygienist, an Airline pilotaccountant.    Allergies  Allergen Reactions  . Alphagan P [Brimonidine Tartrate]     rash  . Codeine Rash  . Penicillins Rash  . Sulfur Rash     Outpatient Medications Prior to Visit  Medication Sig Dispense Refill  . acyclovir ointment (ZOVIRAX) 5 % Apply 1 application topically every 3 (three) hours. To affected area, as needed for outbreak 15 g 1  . aspirin 81 MG tablet Take 81 mg by mouth daily.    Marland Kitchen. atorvastatin (LIPITOR) 10 MG tablet Take 1 tablet (10 mg total) by mouth daily. 90 tablet 3  . Calcium-Phosphorus-Vitamin D (CALCIUM/D3 ADULT GUMMIES PO) Take 2 tablets daily by mouth. 2 gummies daily with meal    . Cholecalciferol (VITAMIN D PO) Take by mouth.    . clobetasol cream (TEMOVATE) 0.05 % Apply 1 application topically 2 (two) times daily. As needed for skin irritation 30 g 0  . DULoxetine (CYMBALTA) 30 MG capsule TAKE 1 CAPSULE BY MOUTH  DAILY 90 capsule 0  . fluticasone (FLONASE) 50 MCG/ACT nasal spray Place 2 sprays into both nostrils daily. 16 g 11  . hydrocortisone (ANUSOL-HC) 25 MG suppository Place 1 suppository (25 mg total) rectally 3 (three) times daily as needed for hemorrhoids or itching. 24 suppository 11  . latanoprost (XALATAN) 0.005 % ophthalmic solution     . loratadine (CLARITIN) 10 MG tablet Take 10 mg by mouth daily.    . meloxicam (MOBIC) 15 MG tablet TAKE 1/2 TO 1 TABLET BY  MOUTH DAILY AS NEEDED FOR  PAIN. 90 tablet 1  . Multiple Vitamin (MULTIVITAMINS PO) Take by mouth.    . polyethylene glycol (MIRALAX / GLYCOLAX) packet Take 17 g by mouth daily.    . timolol (BETIMOL) 0.5 % ophthalmic solution 1 drop.    . Tiotropium Bromide-Olodaterol (STIOLTO RESPIMAT) 2.5-2.5 MCG/ACT AERS Inhale 2 puffs daily into the lungs. 1 Inhaler 3  . valACYclovir (VALTREX) 1000 MG tablet Take 1 tablet  (1,000 mg total) by mouth 2 (two) times daily. 14 tablet 2   No facility-administered medications prior to visit.         Objective:   Physical Exam Vitals:   04/07/17 1559  BP: 136/80  Pulse: 75  SpO2: 95%  Weight: 170 lb (77.1 kg)  Height: 5\' 3"  (1.6 m)   Gen: Pleasant, well-nourished, in no distress,  normal affect  ENT: No lesions,  mouth clear,  oropharynx clear, no postnasal drip  Neck: No JVD, no stridor  Lungs: No use of accessory muscles, Distant but clear, no wheezing  Cardiovascular: RRR, heart sounds normal, no murmur or gallops, no peripheral edema  Musculoskeletal: No deformities, no cyanosis or clubbing  Neuro: alert, non focal  Skin: Warm, no lesions or rashes     Assessment &  Plan:  COPD (chronic obstructive pulmonary disease) (HCC) Airflow obstruction confirmed on her pulmonary function testing today.  Mixed obstruction and restriction.  Surprisingly she did [facility] very much [facility], has been taking it for over a month.  Given her failure to respond I will stop the medication today.  She will make note of whether she misses it in retrospect.  We will use albuterol as needed.  Will reconsider restarting schedule bronchodilator at some point in the future depending on her symptoms.  Obstructive sleep apnea on CPAP Continue CPAP every night  Pulmonary nodules Stable on serial exams.  She does not need follow-up CT  Restrictive lung disease Likely due to weight gain and deconditioning.  I supported [facility] and [facility] program.  Levy Pupaobert Jayanth Szczesniak, MD, PhD 04/07/2017, 4:32 PM [facility] [phone] or if no answer [phone]

## 2017-04-19 ENCOUNTER — Telehealth: Payer: Self-pay | Admitting: Emergency Medicine

## 2017-04-19 NOTE — Telephone Encounter (Signed)
I called but the number was disconnected X 2

## 2017-04-20 NOTE — Telephone Encounter (Signed)
Attempted to contact Jessica. The number provided has been disconnected and nothing was documented as to where [name] BumpsJessica was calling from.

## 2017-04-24 ENCOUNTER — Telehealth: Payer: Self-pay | Admitting: Emergency Medicine

## 2017-04-24 NOTE — Telephone Encounter (Signed)
Spoke with Lillia AbedLindsay, states that she has not received a sx clearance form on this patient.  Called Jessica back at Dr. Judeth Cornfieldiggerstaff's office, form is being refaxed.  Will await form.

## 2017-04-25 ENCOUNTER — Encounter: Payer: Self-pay | Admitting: Osteopathic Medicine

## 2017-04-25 DIAGNOSIS — H9313 Tinnitus, bilateral: Secondary | ICD-10-CM | POA: Insufficient documentation

## 2017-04-25 LAB — CBC
HCT: 36.2 % (ref 35.0–45.0)
HEMOGLOBIN: 12.4 g/dL (ref 11.7–15.5)
MCH: 31.3 pg (ref 27.0–33.0)
MCHC: 34.3 g/dL (ref 32.0–36.0)
MCV: 91.4 fL (ref 80.0–100.0)
MPV: 12.1 fL (ref 7.5–12.5)
PLATELETS: 216 10*3/uL (ref 140–400)
RBC: 3.96 10*6/uL (ref 3.80–5.10)
RDW: 12.3 % (ref 11.0–15.0)
WBC: 5.4 10*3/uL (ref 3.8–10.8)

## 2017-04-25 LAB — LIPID PANEL
CHOL/HDL RATIO: 3.7 (calc) (ref <5.0)
CHOLESTEROL: 183 mg/dL (ref <200)
HDL: 50 mg/dL — AB (ref >50)
LDL CHOLESTEROL (CALC): 103 mg/dL — AB
Non-HDL Cholesterol (Calc): 133 mg/dL (calc) — ABNORMAL HIGH (ref <130)
TRIGLYCERIDES: 189 mg/dL — AB (ref <150)

## 2017-04-25 LAB — COMPLETE METABOLIC PANEL WITH GFR
AG Ratio: 1.2 (calc) (ref 1.0–2.5)
ALBUMIN MSPROF: 3.9 g/dL (ref 3.6–5.1)
ALKALINE PHOSPHATASE (APISO): 85 U/L (ref 33–130)
ALT: 20 U/L (ref 6–29)
AST: 25 U/L (ref 10–35)
BILIRUBIN TOTAL: 0.9 mg/dL (ref 0.2–1.2)
BUN: 22 mg/dL (ref 7–25)
CHLORIDE: 107 mmol/L (ref 98–110)
CO2: 27 mmol/L (ref 20–32)
CREATININE: 0.76 mg/dL (ref 0.60–0.93)
Calcium: 9.1 mg/dL (ref 8.6–10.4)
GFR, Est African American: 87 mL/min/{1.73_m2} (ref >=60)
GFR, Est Non African American: 75 mL/min/{1.73_m2} (ref >=60)
Globulin: 3.3 g/dL (calc) (ref 1.9–3.7)
Glucose, Bld: 106 mg/dL — ABNORMAL HIGH (ref 65–99)
Potassium: 4 mmol/L (ref 3.5–5.3)
Sodium: 140 mmol/L (ref 135–146)
Total Protein: 7.2 g/dL (ref 6.1–8.1)

## 2017-04-25 LAB — TSH: TSH: 5.47 mIU/L — ABNORMAL HIGH (ref 0.40–4.50)

## 2017-04-25 LAB — T4, FREE: Free T4: 0.9 ng/dL (ref 0.8–1.8)

## 2017-04-25 LAB — VITAMIN D 25 HYDROXY (VIT D DEFICIENCY, FRACTURES): Vit D, 25-Hydroxy: 43 ng/mL (ref 30–100)

## 2017-04-25 NOTE — Telephone Encounter (Signed)
I checked RB folder, did not see sx clearance form. LL please advise if you've received this form, or if this needs to be re-requested.  Thanks!

## 2017-04-25 NOTE — Telephone Encounter (Signed)
Surgical clearance form has been received per Leigh. It has been placed in RB's look at. Will address once he returns to the office.

## 2017-04-26 ENCOUNTER — Ambulatory Visit (INDEPENDENT_AMBULATORY_CARE_PROVIDER_SITE_OTHER): Payer: Medicare Other

## 2017-04-26 ENCOUNTER — Encounter: Payer: Self-pay | Admitting: Osteopathic Medicine

## 2017-04-26 ENCOUNTER — Ambulatory Visit (INDEPENDENT_AMBULATORY_CARE_PROVIDER_SITE_OTHER): Payer: Medicare Other | Admitting: Osteopathic Medicine

## 2017-04-26 VITALS — BP 115/60 | HR 75 | Temp 97.5°F | Ht 62.0 in | Wt 170.0 lb

## 2017-04-26 DIAGNOSIS — M8580 Other specified disorders of bone density and structure, unspecified site: Secondary | ICD-10-CM | POA: Insufficient documentation

## 2017-04-26 DIAGNOSIS — M25551 Pain in right hip: Secondary | ICD-10-CM

## 2017-04-26 DIAGNOSIS — M8588 Other specified disorders of bone density and structure, other site: Secondary | ICD-10-CM | POA: Diagnosis not present

## 2017-04-26 DIAGNOSIS — R7301 Impaired fasting glucose: Secondary | ICD-10-CM

## 2017-04-26 DIAGNOSIS — M85852 Other specified disorders of bone density and structure, left thigh: Secondary | ICD-10-CM | POA: Diagnosis not present

## 2017-04-26 DIAGNOSIS — Z0000 Encounter for general adult medical examination without abnormal findings: Secondary | ICD-10-CM

## 2017-04-26 DIAGNOSIS — M19049 Primary osteoarthritis, unspecified hand: Secondary | ICD-10-CM

## 2017-04-26 LAB — POCT GLYCOSYLATED HEMOGLOBIN (HGB A1C): HEMOGLOBIN A1C: 5.6

## 2017-04-26 MED ORDER — ATORVASTATIN CALCIUM 10 MG PO TABS: 10.0000 mg | ORAL_TABLET | Freq: Every day | ORAL | 3 refills | Status: DC

## 2017-04-26 MED ORDER — MELOXICAM 15 MG PO TABS: 15.0000 mg | ORAL_TABLET | Freq: Every day | ORAL | 1 refills | Status: DC

## 2017-04-26 MED ORDER — DULOXETINE HCL 30 MG PO CPEP: 30.0000 mg | ORAL_CAPSULE | Freq: Every day | ORAL | 3 refills | Status: DC

## 2017-04-26 NOTE — Patient Instructions (Addendum)
Plan:  Recommend bone density test - please go downstairs to imaging department today to schedule this  Fasting blood sugar was a bit high, we checked A1C level today which showed sugars just below the prediabetic range - nothing else to do at this time    Would work on exercise for strength/balance, portion control and increased protein/fiber intake for weight loss.

## 2017-04-26 NOTE — Progress Notes (Signed)
Subjective:   [name] [name] is a 76 y.o. female who presents for Medicare Annual (Subsequent) preventive examination.  Review of Systems:  Feeling well today - requests refills on medications for hip pain/osteoarthritis,  Medicare questionnaire reviewed in detail with the patient. No other smokers in the home, depression screen negative, activities of daily living no issues except for one fall in the past year, hearing difficulties with tinnitus and she has recently seen ENT for this, occasional memory problems. The patient feels safe at home.  Care team updated with patient's other physicians  Patient Care Team: Sunnie Nielsen, DO as PCP - General (Osteopathic Medicine) Leslye Peer, MD as Consulting Physician (Pulmonary Disease) Elane Fritz, MD as Consulting Physician (Otolaryngology)        Objective:     Vitals: BP 115/60   Pulse 75   Temp (!) 97.5 F (36.4 C) (Oral)   Ht 5\' 2"  (1.575 m)   Wt 170 lb (77.1 kg)   BMI 31.09 kg/m   Body mass index is 31.09 kg/m.  Advanced Directives 12/08/2016 12/01/2016 08/05/2015  Does Patient Have a Medical Advance Directive? Yes Yes Yes  Type of Advance Directive Living will - -  Does patient want to make changes to medical advance directive? No - Patient declined - -  Copy of Healthcare Power of Attorney in Chart? No - copy requested No - copy requested (No Data)    Tobacco Social History   Tobacco Use  Smoking Status Former Smoker  . Packs/day: 2.00  . Years: 25.00  . Pack years: 50.00  . Last attempt to quit: 03/07/1981  . Years since quitting: 36.1  Smokeless Tobacco Never Used     Counseling given: Not Answered   Clinical Intake:   Past Medical History:  Diagnosis Date  . Anxiety    intermittent  . blood in stool   . Diverticulosis   . Fibromyalgia   . GERD (gastroesophageal reflux disease)   . Glaucoma   . Hemorrhoids   . HSV-2 (herpes simplex virus 2) infection 2009  . OSA on CPAP 07/27/06  .  Osteoporosis   . Rectal bleeding    with hemorrhoids   Past Surgical History:  Procedure Laterality Date  . BUNIONECTOMY  [id number]  . CARPAL TUNNEL RELEASE Right 12/08/2016   Procedure: CARPAL TUNNEL RELEASE;  Surgeon: Cindee Salt, MD;  Location: [facility];  Service: Orthopedics;  Laterality: Right;  REG/FAB  . CERVICAL FUSION  2008  . hemorrhoids removed    . TRIGGER FINGER RELEASE  2008   Family History  Problem Relation Age of Onset  . Heart attack Father   . Hyperlipidemia Mother   . Hypertension Mother    Social History   Socioeconomic History  . Marital status: Married    Spouse name: Not on file  . Number of children: Not on file  . Years of education: Not on file  . Highest education level: Not on file  Social Needs  . Financial resource strain: Not on file  . Food insecurity - worry: Not on file  . Food insecurity - inability: Not on file  . Transportation needs - medical: Not on file  . Transportation needs - non-medical: Not on file  Occupational History  . Not on file  Tobacco Use  . Smoking status: Former Smoker    Packs/day: 2.00    Years: 25.00    Pack years: 50.00    Last attempt to quit: 03/07/1981  Years since quitting: 36.1  . Smokeless tobacco: Never Used  Substance and Sexual Activity  . Alcohol use: Yes    Alcohol/week: 0.0 oz    Comment: 2 drinks per month  . Drug use: No  . Sexual activity: No  Other Topics Concern  . Not on file  Social History Narrative   Tobacco use cigarettes: former smoker, tobacco history last updated 10/28/2013. No smoking. No tobacco exposure, no alcohol. No caffeine. No recreational drug use. Exercise: yes, some walking-not much. 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 Marital status: married.    Outpatient Encounter Medications as of 04/26/2017  Medication Sig  . acyclovir ointment (ZOVIRAX) 5 % Apply 1 application topically every 3 (three) hours. To  affected area, as needed for outbreak  . albuterol (PROVENTIL HFA;VENTOLIN HFA) 108 (90 Base) MCG/ACT inhaler Inhale 2 puffs into the lungs every 4 (four) hours as needed for wheezing or shortness of breath.  Marland Kitchen aspirin 81 MG tablet Take 81 mg by mouth daily.  Marland Kitchen atorvastatin (LIPITOR) 10 MG tablet Take 1 tablet (10 mg total) by mouth daily.  . Calcium-Phosphorus-Vitamin D (CALCIUM/D3 ADULT GUMMIES PO) Take 2 tablets daily by mouth. 2 gummies daily with meal  . Cholecalciferol (VITAMIN D PO) Take by mouth.  . clobetasol cream (TEMOVATE) 0.05 % Apply 1 application topically 2 (two) times daily. As needed for skin irritation  . DULoxetine (CYMBALTA) 30 MG capsule TAKE 1 CAPSULE BY MOUTH  DAILY  . fluticasone (FLONASE) 50 MCG/ACT nasal spray Place 2 sprays into both nostrils daily.  . hydrocortisone (ANUSOL-HC) 25 MG suppository Place 1 suppository (25 mg total) rectally 3 (three) times daily as needed for hemorrhoids or itching.  . latanoprost (XALATAN) 0.005 % ophthalmic solution   . loratadine (CLARITIN) 10 MG tablet Take 10 mg by mouth daily.  . meloxicam (MOBIC) 15 MG tablet TAKE 1/2 TO 1 TABLET BY  MOUTH DAILY AS NEEDED FOR  PAIN.  . Multiple Vitamin (MULTIVITAMINS PO) Take by mouth.  . polyethylene glycol (MIRALAX / GLYCOLAX) packet Take 17 g by mouth daily.  . timolol (BETIMOL) 0.5 % ophthalmic solution 1 drop.  . Tiotropium Bromide-Olodaterol (STIOLTO RESPIMAT) 2.5-2.5 MCG/ACT AERS Inhale 2 puffs daily into the lungs.  . valACYclovir (VALTREX) 1000 MG tablet Take 1 tablet (1,000 mg total) by mouth 2 (two) times daily.   No facility-administered encounter medications on file as of 04/26/2017.     Activities of Daily Living In your present state of health, do you have any difficulty performing the following activities: 12/08/2016  Hearing? N  Vision? N  Difficulty concentrating or making decisions? N  Walking or climbing stairs? N  Dressing or bathing? N  Some recent data might be hidden     Patient Care Team: Sunnie Nielsen, DO as PCP - General (Osteopathic Medicine)    Assessment:   This is a routine wellness examination for [name] [name].  Exercise Activities and Dietary recommendations    Goals    None      Fall Risk Fall Risk  01/19/2017  Falls in the past year? No   Is the patient's home free of loose throw rugs in walkways, pet beds, electrical cords, etc?   yes      Grab bars in the bathroom? no      Handrails on the stairs?   yes      Adequate lighting?   yes    Depression Screen PHQ 2/9 Scores 01/19/2017  PHQ - 2 Score 0  Cognitive Function        Immunization History  Administered Date(s) Administered  . Influenza, High Dose Seasonal PF 01/19/2017  . Influenza,inj,Quad PF,6+ Mos 04/08/2015, 02/04/2016  . Pneumococcal Conjugate-13 04/10/2014  . Pneumococcal Polysaccharide-23 03/17/2004  . Tdap 06/26/2007  . Zoster 03/01/2006    Qualifies for Shingles Vaccine? Yes - new Shingrix available   Screening Tests Health Maintenance  Topic Date Due  . TETANUS/TDAP  06/25/2017  . INFLUENZA VACCINE  Completed  . DEXA SCAN  Completed  . PNA vac Low Risk Adult  Completed    Cancer Screenings: Lung: Low Dose CT Chest recommended if Age 35-80 years, 30 pack-year currently smoking OR have quit w/in 15years. Patient does not qualify. Breast:  Up to date on Mammogram? Yes   Up to date of Bone Density/Dexa? No Colorectal: not needed  Additional Screenings: not needed Hepatitis B/HIV/Syphillis: Hepatitis C Screening:   Recent Results (from the past 2160 hour(s))  Pulmonary function test     Status: None (Preliminary result)   Collection Time: 04/07/17  2:47 PM  Result Value Ref Range   FVC-Pre 1.52 L   FVC-%Pred-Pre 59 %   FVC-Post 1.54 L   FVC-%Pred-Post 59 %   FVC-%Change-Post 1 %   FEV1-Pre 1.09 L   FEV1-%Pred-Pre 57 %   FEV1-Post 1.16 L   FEV1-%Pred-Post 60 %   FEV1-%Change-Post 6 %   FEV6-Pre 1.52 L   FEV6-%Pred-Pre 62 %    FEV6-Post 1.54 L   FEV6-%Pred-Post 63 %   FEV6-%Change-Post 1 %   Pre FEV1/FVC ratio 72 %   FEV1FVC-%Pred-Pre 97 %   Post FEV1/FVC ratio 76 %   FEV1FVC-%Change-Post 5 %   Pre FEV6/FVC Ratio 100 %   FEV6FVC-%Pred-Pre 105 %   Post FEV6/FVC ratio 100 %   FEV6FVC-%Pred-Post 105 %   FEF 25-75 Pre 0.73 L/sec   FEF2575-%Pred-Pre 50 %   FEF 25-75 Post 0.89 L/sec   FEF2575-%Pred-Post 61 %   FEF2575-%Change-Post 22 %   RV 2.62 L   RV % pred 114 %   TLC 4.18 L   TLC % pred 85 %   DLCO unc 13.23 ml/min/mmHg   DLCO unc % pred 57 %   DLCO cor 13.27 ml/min/mmHg   DLCO cor % pred 57 %   DL/VA 5.784.11 ml/min/mmHg/L   DL/VA % pred 87 %  CBC     Status: None   Collection Time: 04/24/17 11:13 AM  Result Value Ref Range   WBC 5.4 3.8 - 10.8 Thousand/uL   RBC 3.96 3.80 - 5.10 Million/uL   Hemoglobin 12.4 11.7 - 15.5 g/dL   HCT 46.936.2 62.935.0 - 52.845.0 %   MCV 91.4 80.0 - 100.0 fL   MCH 31.3 27.0 - 33.0 pg   MCHC 34.3 32.0 - 36.0 g/dL   RDW 41.312.3 24.411.0 - 01.015.0 %   Platelets 216 140 - 400 Thousand/uL   MPV 12.1 7.5 - 12.5 fL  COMPLETE METABOLIC PANEL WITH GFR     Status: Abnormal   Collection Time: 04/24/17 11:13 AM  Result Value Ref Range   Glucose, Bld 106 (H) 65 - 99 mg/dL    Comment: .            Fasting reference interval . For someone without known diabetes, a glucose value between 100 and 125 mg/dL is consistent with prediabetes and should be confirmed with a follow-up test. .    BUN 22 7 - 25 mg/dL   Creat 2.720.76 5.360.60 -  0.93 mg/dL    Comment: For patients >43 years of age, the reference limit for Creatinine is approximately 13% higher for people identified as African-American. .    GFR, Est Non African American 75 > OR = 60 mL/min/1.77m2   GFR, Est African American 87 > OR = 60 mL/min/1.71m2   BUN/Creatinine Ratio NOT APPLICABLE 6 - 22 (calc)   Sodium 140 135 - 146 mmol/L   Potassium 4.0 3.5 - 5.3 mmol/L   Chloride 107 98 - 110 mmol/L   CO2 27 20 - 32 mmol/L   Calcium 9.1 8.6 - 10.4  mg/dL   Total Protein 7.2 6.1 - 8.1 g/dL   Albumin 3.9 3.6 - 5.1 g/dL   Globulin 3.3 1.9 - 3.7 g/dL (calc)   AG Ratio 1.2 1.0 - 2.5 (calc)   Total Bilirubin 0.9 0.2 - 1.2 mg/dL   Alkaline phosphatase (APISO) 85 33 - 130 U/L   AST 25 10 - 35 U/L   ALT 20 6 - 29 U/L  Lipid panel     Status: Abnormal   Collection Time: 04/24/17 11:13 AM  Result Value Ref Range   Cholesterol 183 <200 mg/dL   HDL 50 (L) >40 mg/dL   Triglycerides 981 (H) <150 mg/dL   LDL Cholesterol (Calc) 103 (H) mg/dL (calc)    Comment: Reference range: <100 . Desirable range <100 mg/dL for primary prevention;   <70 mg/dL for patients with CHD or diabetic patients  with > or = 2 CHD risk factors. Marland Kitchen LDL-C is now calculated using the Martin-Hopkins  calculation, which is a validated novel method providing  better accuracy than the Friedewald equation in the  estimation of LDL-C.  Horald Pollen et al. Lenox Ahr. 1914;782(95): 2061-2068  (http://education.[facility].com/faq/FAQ164)    Total CHOL/HDL Ratio 3.7 <5.0 (calc)   Non-HDL Cholesterol (Calc) 133 (H) <130 mg/dL (calc)    Comment: For patients with diabetes plus 1 major ASCVD risk  factor, treating to a non-HDL-C goal of <100 mg/dL  (LDL-C of <62 mg/dL) is considered a therapeutic  option.   TSH     Status: Abnormal   Collection Time: 04/24/17 11:13 AM  Result Value Ref Range   TSH 5.47 (H) 0.40 - 4.50 mIU/L  T4, free     Status: None   Collection Time: 04/24/17 11:13 AM  Result Value Ref Range   Free T4 0.9 0.8 - 1.8 ng/dL  VITAMIN D 25 Hydroxy (Vit-D Deficiency, Fractures)     Status: None   Collection Time: 04/24/17 11:13 AM  Result Value Ref Range   Vit D, 25-Hydroxy 43 30 - 100 ng/mL    Comment: Vitamin D Status         25-OH Vitamin D: . Deficiency:                    <20 ng/mL Insufficiency:             20 - 29 ng/mL Optimal:                 > or = 30 ng/mL . For 25-OH Vitamin D testing on patients on  D2-supplementation and patients for whom  quantitation  of D2 and D3 fractions is required, the [facility]ureD(TM) 25-OH VIT D, (D2,D3), LC/MS/MS is recommended: order  code 13086 (patients >58yrs). . For more information on this test, go to: http://education.[facility].com/faq/FAQ163 (This link is being provided for  informational/educational purposes only.)   POCT HgB A1C     Status: None  Collection Time: 04/26/17 10:48 AM  Result Value Ref Range   Hemoglobin A1C 5.6        Plan:  The primary encounter diagnosis was Medicare annual wellness visit, subsequent. Diagnoses of Right hip pain, Osteoarthritis of hand, unspecified laterality, unspecified osteoarthritis type, Elevated fasting glucose, and Encounter for Medicare annual wellness exam were also pertinent to this visit.  Meds ordered this encounter  Medications  . DULoxetine (CYMBALTA) 30 MG capsule    Sig: Take 1 capsule (30 mg total) by mouth daily.    Dispense:  90 capsule    Refill:  3  . meloxicam (MOBIC) 15 MG tablet    Sig: Take 1 tablet (15 mg total) by mouth daily. As needed for pain    Dispense:  90 tablet    Refill:  1  . atorvastatin (LIPITOR) 10 MG tablet    Sig: Take 1 tablet (10 mg total) by mouth daily.    Dispense:  90 tablet    Refill:  3     I have personally reviewed and noted the following in the patient's chart:   . Medical and social history . Use of alcohol, tobacco or illicit drugs  . Current medications and supplements . Functional ability and status . Nutritional status . Physical activity . Advanced directives . List of other physicians . Hospitalizations, surgeries, and ER visits in previous 12 months . Vitals . Screenings to include cognitive, depression, and falls . Referrals and appointments  In addition, I have reviewed and discussed with patient certain preventive protocols, quality metrics, and best practice recommendations. A written personalized care plan for preventive services as well as general preventive  health recommendations were provided to patient.     Sunnie NielsenNatalie Marjorie Lussier, DO  04/26/2017

## 2017-08-04 ENCOUNTER — Encounter: Payer: Self-pay | Admitting: Osteopathic Medicine

## 2017-08-11 ENCOUNTER — Telehealth: Payer: Self-pay

## 2017-08-11 ENCOUNTER — Ambulatory Visit: Payer: Medicare Other

## 2017-08-11 DIAGNOSIS — Z23 Encounter for immunization: Secondary | ICD-10-CM

## 2017-08-11 MED ORDER — AMBULATORY NON FORMULARY MEDICATION: 0 refills | Status: DC

## 2017-08-11 NOTE — Telephone Encounter (Signed)
Pt presented today for a nurse visit for Tdap injection. Due to pt's insurance - pt must have injection completed at pharmacy for insurance coverage. Rx placed in provider's box pending signature. Rx order will be faxed to [phone].

## 2017-08-17 ENCOUNTER — Encounter: Payer: Self-pay | Admitting: Emergency Medicine

## 2017-08-17 ENCOUNTER — Ambulatory Visit: Payer: Medicare Other | Admitting: Emergency Medicine

## 2017-08-17 DIAGNOSIS — J449 Chronic obstructive pulmonary disease, unspecified: Secondary | ICD-10-CM

## 2017-08-17 DIAGNOSIS — R918 Other nonspecific abnormal finding of lung field: Secondary | ICD-10-CM

## 2017-08-17 DIAGNOSIS — J984 Other disorders of lung: Secondary | ICD-10-CM | POA: Diagnosis not present

## 2017-08-17 DIAGNOSIS — G4733 Obstructive sleep apnea (adult) (pediatric): Secondary | ICD-10-CM | POA: Diagnosis not present

## 2017-08-17 DIAGNOSIS — Z9989 Dependence on other enabling machines and devices: Secondary | ICD-10-CM

## 2017-08-17 NOTE — Assessment & Plan Note (Signed)
Stable on serial CT's

## 2017-08-17 NOTE — Assessment & Plan Note (Signed)
Suspect due to weight and deconditioning. Encouraged her to restart her exercise routine

## 2017-08-17 NOTE — Assessment & Plan Note (Signed)
Continue CPAP every night. 

## 2017-08-17 NOTE — Patient Instructions (Addendum)
Please continue your loratadine once a day Keep albuterol available to use 2 puffs if needed for shortness of breath, chest tightness, wheezing.  You may want to try using this medication about 10 minutes before heavy exertion to see if it makes your exercise and functional capacity better.  Keep track of whether the albuterol helps you in these situations.  Depending on how you do we may decide to retry an every day inhaler to help your breathing. Wear your CPAP every night.  Agree with going back to your exercise routine. Pace yourself as you restart.  Follow with Dr Delton CoombesByrum in 6 months or sooner if you have any problems

## 2017-08-17 NOTE — Assessment & Plan Note (Signed)
Combined obstruction and restriction with significant decrease in her FEV1.  She did not benefit from Lama/La we will continue albuterol, try to pretreat exercise and see if she benefits.ba.  It may still be the case that she should retry a long-acting bronchodilator.  We will revisit this when I see her back.

## 2017-08-17 NOTE — Progress Notes (Signed)
Subjective:    Patient ID: [name] [name], female    DOB: 1940/08/18, 77 y.o.   MRN: [id number]  HPI 77 year old former smoker (with a history of fibromyalgia, GERD, osteoporosis, obstructive sleep apnea on CPAP. She is referred today for evaluation of dyspnea and also pulmonary nodules that have been stable on serial CT scans back to 09/2013. She has good compliance with her nasal pillows / CPAP.   She reports progressive exertional SOB over last 2 years. She has rhinitis that is better on loratadine, was recently started on fluticasone NS as well > seems to be helping her. She and her daughters have heard some wheezing. She coughs . She had spirometry at her PCP 01/24/17 >> FEV1 0.79 L (43% predicted), FVC 1.21 L (49% predicted). She benefits from CPAP > less sleepy, more energy.   ROV 04/07/17 --patient follows up today for evaluation of shortness of breath.  She has a history of obstructive sleep apnea on CPAP.  Spirometry in September 2018 suggested obstructive lung disease.  At our initial visit we started Stiolto to see if she would benefit.  She reports that the stiolto hasn't changed very much - she still feels dyspnea with home chores. PFT today reviewed by me, spirometry shows evidence for mixed obstruction and restriction without a bronchodilator response, lung volumes are normal diffusion capacity is slightly decreased and corrects to the normal range when adjusted for alveolar volume. She does have to stop to rest w activity, still able to do her daily activities.   ROV 08/17/17 --77 year old woman with a history of obstructive sleep apnea on CPAP, obstructive lung disease suggested by past spirometry and on pulmonary function testing from 03/2017.  At her last visit we tried stopping Stiolto to see how she would tolerate being off this medication. She has been using ProAir prn, often can get some dyspnea with exertion, housework. She believes that it may help her although ? Whether  she is benefiting from rest.   She underwent a walking oximetry that did not show any evidence of desaturation.  She has been maintained on CPAP. She has not gone back to exercising yet, got delayed when she had oral surgery.    Review of Systems  Constitutional: Negative for fever and unexpected weight change.  HENT: Negative for congestion, dental problem, ear pain, nosebleeds, postnasal drip, rhinorrhea, sinus pressure, sneezing, sore throat and trouble swallowing.   Eyes: Negative for redness and itching.  Respiratory: Positive for cough, chest tightness, shortness of breath and wheezing.   Cardiovascular: Negative for palpitations and leg swelling.  Gastrointestinal: Negative for nausea and vomiting.  Genitourinary: Negative for dysuria.  Musculoskeletal: Negative for joint swelling.  Skin: Negative for rash.  Neurological: Negative for headaches.  Hematological: Does not bruise/bleed easily.  Psychiatric/Behavioral: Negative for dysphoric mood. The patient is not nervous/anxious.    Past Medical History:  Diagnosis Date  . Anxiety    intermittent  . blood in stool   . Diverticulosis   . Fibromyalgia   . GERD (gastroesophageal reflux disease)   . Glaucoma   . Hemorrhoids   . HSV-2 (herpes simplex virus 2) infection 2009  . OSA on CPAP 07/27/06  . Osteoporosis   . Rectal bleeding    with hemorrhoids     Family History  Problem Relation Age of Onset  . Heart attack Father   . Hyperlipidemia Mother   . Hypertension Mother      Social History   Socioeconomic History  .  Marital status: Married    Spouse name: Not on file  . Number of children: Not on file  . Years of education: Not on file  . Highest education level: Not on file  Occupational History  . Not on file  Social Needs  . Financial resource strain: Not on file  . Food insecurity:    Worry: Not on file    Inability: Not on file  . Transportation needs:    Medical: Not on file    Non-medical: Not on  file  Tobacco Use  . Smoking status: Former Smoker    Packs/day: 2.00    Years: 25.00    Pack years: 50.00    Last attempt to quit: 03/07/1981    Years since quitting: 36.4  . Smokeless tobacco: Never Used  Substance and Sexual Activity  . Alcohol use: Yes    Alcohol/week: 0.0 oz    Comment: 2 drinks per month  . Drug use: No  . Sexual activity: Never  Lifestyle  . Physical activity:    Days per week: Not on file    Minutes per session: Not on file  . Stress: Not on file  Relationships  . Social connections:    Talks on phone: Not on file    Gets together: Not on file    Attends religious service: Not on file    Active member of club or organization: Not on file    Attends meetings of clubs or organizations: Not on file    Relationship status: Not on file  . Intimate partner violence:    Fear of current or ex partner: Not on file    Emotionally abused: Not on file    Physically abused: Not on file    Forced sexual activity: Not on file  Other Topics Concern  . Not on file  Social History Narrative   Tobacco use cigarettes: former smoker, tobacco history last updated 10/28/2013. No smoking. No tobacco exposure, no alcohol. No caffeine. No recreational drug use. Exercise: yes, some walking-not much. 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 Marital status: married.  from [place], [place], [place], [place] Has worked Armed forces operational officerdental hygienist, an Airline pilotaccountant.    Allergies  Allergen Reactions  . Alphagan P [Brimonidine Tartrate]     rash  . Codeine Rash  . Penicillins Rash  . Sulfur Rash     Outpatient Medications Prior to Visit  Medication Sig Dispense Refill  . albuterol (PROAIR HFA) 108 (90 Base) MCG/ACT inhaler Inhale 2 puffs into the lungs every 6 (six) hours as needed for wheezing or shortness of breath.    . AMBULATORY NON FORMULARY MEDICATION Tdap vaccine 1 each 0  . aspirin 81 MG tablet Take 81 mg by mouth daily.    Marland Kitchen. atorvastatin (LIPITOR) 10 MG tablet  Take 1 tablet (10 mg total) by mouth daily. 90 tablet 3  . Calcium-Phosphorus-Vitamin D (CALCIUM/D3 ADULT GUMMIES PO) Take 2 tablets daily by mouth. 2 gummies daily with meal    . Cholecalciferol (VITAMIN D PO) Take by mouth.    . clobetasol cream (TEMOVATE) 0.05 % Apply 1 application topically 2 (two) times daily. As needed for skin irritation 30 g 0  . DULoxetine (CYMBALTA) 30 MG capsule Take 1 capsule (30 mg total) by mouth daily. 90 capsule 3  . hydrocortisone (ANUSOL-HC) 25 MG suppository Place 1 suppository (25 mg total) rectally 3 (three) times daily as needed for hemorrhoids or itching. 24 suppository 11  . latanoprost (XALATAN) 0.005 % ophthalmic  solution     . loratadine (CLARITIN) 10 MG tablet Take 10 mg by mouth daily.    . meloxicam (MOBIC) 15 MG tablet Take 1 tablet (15 mg total) by mouth daily. As needed for pain 90 tablet 1  . Multiple Vitamin (MULTIVITAMINS PO) Take by mouth.    . polyethylene glycol (MIRALAX / GLYCOLAX) packet Take 17 g by mouth daily.    . timolol (BETIMOL) 0.5 % ophthalmic solution 1 drop.    . Tiotropium Bromide-Olodaterol (STIOLTO RESPIMAT) 2.5-2.5 MCG/ACT AERS Inhale 2 puffs daily into the lungs. (Patient not taking: Reported on 08/17/2017) 1 Inhaler 3   No facility-administered medications prior to visit.         Objective:   Physical Exam Vitals:   08/17/17 1345  BP: 120/84  Pulse: 81  SpO2: 96%  Weight: 167 lb (75.8 kg)  Height: 5\' 2"  (1.575 m)   Gen: Pleasant, well-nourished, in no distress,  normal affect  ENT: No lesions,  mouth clear,  oropharynx clear, no postnasal drip  Neck: No JVD, no stridor  Lungs: No use of accessory muscles, Distant but clear, no wheezing  Cardiovascular: RRR, heart sounds normal, no murmur or gallops, no peripheral edema  Musculoskeletal: No deformities, no cyanosis or clubbing  Neuro: alert, non focal  Skin: Warm, no lesions or rashes     Assessment & Plan:  COPD (chronic obstructive pulmonary  disease) (HCC) Combined obstruction and restriction with significant decrease in her FEV1.  She did not benefit from Lama/La we will continue albuterol, try to pretreat exercise and see if she benefits.ba.  It may still be the case that she should retry a long-acting bronchodilator.  We will revisit this when I see her back.  Restrictive lung disease Suspect due to weight and deconditioning. Encouraged her to restart her exercise routine   Pulmonary nodules Stable on serial CT's  Obstructive sleep apnea on CPAP Continue CPAP every night  Levy Pupa, MD, PhD 08/17/2017, 2:13 PM [facility] [phone] or if no answer [phone]

## 2017-11-14 ENCOUNTER — Other Ambulatory Visit: Payer: Self-pay | Admitting: Osteopathic Medicine

## 2017-11-14 DIAGNOSIS — M19049 Primary osteoarthritis, unspecified hand: Secondary | ICD-10-CM

## 2017-12-13 ENCOUNTER — Other Ambulatory Visit: Payer: Self-pay | Admitting: Osteopathic Medicine

## 2017-12-13 DIAGNOSIS — M19049 Primary osteoarthritis, unspecified hand: Secondary | ICD-10-CM

## 2017-12-25 ENCOUNTER — Other Ambulatory Visit: Payer: Self-pay | Admitting: Osteopathic Medicine

## 2017-12-25 DIAGNOSIS — Z1239 Encounter for other screening for malignant neoplasm of breast: Secondary | ICD-10-CM

## 2018-01-03 ENCOUNTER — Other Ambulatory Visit: Payer: Self-pay | Admitting: Osteopathic Medicine

## 2018-01-03 DIAGNOSIS — M19049 Primary osteoarthritis, unspecified hand: Secondary | ICD-10-CM

## 2018-01-30 ENCOUNTER — Telehealth: Payer: Self-pay | Admitting: Osteopathic Medicine

## 2018-01-30 NOTE — Telephone Encounter (Signed)
I WILL BE HAPPY TO SEE HER,   Just let her know that there will be times where I may be booked andmay have to see a partner if having an acute issues.

## 2018-01-30 NOTE — Telephone Encounter (Signed)
Patient is a current [name] patient but feels that her medical needs are not being met. Pt wants to switch to [name]. I informed patient that [name] was not taking new patients, but patient mentioned that her brother and sister-in-law [name] [name] & [name] [name]) are current [name] patients. Pt is a former [name] patient who was told when he left that [name] was not taking new patients (she was apparently not told that [name] will accept new patients of family members who are current patients). Please advise.

## 2018-01-31 NOTE — Telephone Encounter (Signed)
I called pt and left a message to let her know Dr.metheney is okay with seeing her as her Primary care patient IF she would still like to switch her pcp

## 2018-01-31 NOTE — Telephone Encounter (Signed)
Routing to scheduler for appt to be made.  

## 2018-02-05 ENCOUNTER — Encounter: Payer: Self-pay | Admitting: Family Medicine

## 2018-02-05 ENCOUNTER — Ambulatory Visit: Payer: Medicare Other | Admitting: Family Medicine

## 2018-02-05 VITALS — BP 134/74 | HR 79 | Ht 62.0 in | Wt 163.0 lb

## 2018-02-05 DIAGNOSIS — R7989 Other specified abnormal findings of blood chemistry: Secondary | ICD-10-CM

## 2018-02-05 DIAGNOSIS — Z23 Encounter for immunization: Secondary | ICD-10-CM

## 2018-02-05 DIAGNOSIS — Z79899 Other long term (current) drug therapy: Secondary | ICD-10-CM

## 2018-02-05 DIAGNOSIS — E039 Hypothyroidism, unspecified: Secondary | ICD-10-CM | POA: Insufficient documentation

## 2018-02-05 DIAGNOSIS — M797 Fibromyalgia: Secondary | ICD-10-CM

## 2018-02-05 DIAGNOSIS — M19049 Primary osteoarthritis, unspecified hand: Secondary | ICD-10-CM

## 2018-02-05 DIAGNOSIS — E038 Other specified hypothyroidism: Secondary | ICD-10-CM

## 2018-02-05 MED ORDER — MELOXICAM 15 MG PO TABS: 15.0000 mg | ORAL_TABLET | Freq: Every day | ORAL | 1 refills | Status: DC | PRN

## 2018-02-05 NOTE — Progress Notes (Signed)
Subjective:    Patient ID: [name] [name], female    DOB: 08-Oct-1940, 77 y.o.   MRN: [id number]  HPI   Follow-up fibromyalgia-she is currently on Cymbalta.  She says this is really helped with her day-to-day pain.  Though she also has arthritis in her hands and so uses meloxicam and Tylenol as needed.  OSA  On CPAP -she is doing well overall.  She follows with Dr. Allie Dimmer periodically for her sleep.  She is been getting her CPAP supplies on the Internet and pain cash for them for several years.  But she feels like it is working well and she uses it every single night.  F/U COPD  -she rarely uses her albuterol.  She has noticed some shortness of breath on days that are really hot.  She follows with Dr. Delton Coombes at [facility] and has an appointment coming up soon.   -Review of Systems  BP 134/74   Pulse 79   Ht 5\' 2"  (1.575 m)   Wt 163 lb (73.9 kg)   SpO2 98%   BMI 29.81 kg/m     Allergies  Allergen Reactions  . Alphagan P [Brimonidine Tartrate]     rash  . Codeine Rash  . Penicillins Rash  . Sulfur Rash    Past Medical History:  Diagnosis Date  . Anxiety    intermittent  . blood in stool   . Diverticulosis   . Fibromyalgia   . GERD (gastroesophageal reflux disease)   . Glaucoma   . Hemorrhoids   . HSV-2 (herpes simplex virus 2) infection 2009  . OSA on CPAP 07/27/06  . Osteoporosis   . Rectal bleeding    with hemorrhoids    Past Surgical History:  Procedure Laterality Date  . BUNIONECTOMY  [id number]  . CARPAL TUNNEL RELEASE Right 12/08/2016   Procedure: CARPAL TUNNEL RELEASE;  Surgeon: Cindee Salt, MD;  Location: [facility];  Service: Orthopedics;  Laterality: Right;  REG/FAB  . CERVICAL FUSION  2008  . hemorrhoids removed    . TRIGGER FINGER RELEASE  2008    Social History   Socioeconomic History  . Marital status: Married    Spouse name: Not on file  . Number of children: Not on file  . Years of education: Not on file  . Highest  education level: Not on file  Occupational History  . 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 Financial resource strain: Not on file  . Food insecurity:    Worry: Not on file    Inability: Not on file  . Transportation needs:    Medical: Not on file    Non-medical: Not on file  Tobacco Use  . Smoking status: Former Smoker    Packs/day: 2.00    Years: 25.00    Pack years: 50.00    Last attempt to quit: 03/07/1981    Years since quitting: 36.9  . Smokeless tobacco: Never Used  Substance and Sexual Activity  . Alcohol use: Yes    Alcohol/week: 0.0 standard drinks    Comment: 2 drinks per month  . Drug use: No  . Sexual activity: Never  Lifestyle  . Physical activity:    Days per week: Not on file    Minutes per session: Not on file  . Stress: Not on file  Relationships  . Social connections:    Talks on phone: Not on file    Gets together: Not on file  Attends religious service: Not on file    Active member of club or organization: Not on file    Attends meetings of clubs or organizations: Not on file    Relationship status: Not on file  . Intimate partner violence:    Fear of current or ex partner: Not on file    Emotionally abused: Not on file    Physically abused: Not on file    Forced sexual activity: Not on file  Other Topics Concern  . Not on file  Social History Narrative   Tobacco use cigarettes: former smoker, tobacco history last updated 10/28/2013. No smoking. No tobacco exposure, no alcohol. No caffeine. No recreational drug use. Exercise: yes, some walking-not much. 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 Marital status: married.    Family History  Problem Relation Age of Onset  . Heart attack Father   . Hyperlipidemia Mother   . Hypertension Mother     Outpatient Encounter Medications as of 02/05/2018  Medication Sig  . albuterol (PROAIR HFA) 108 (90 Base) MCG/ACT inhaler Inhale 2  puffs into the lungs every 4 (four) hours as needed for wheezing or shortness of breath.  Marland Kitchen aspirin 81 MG tablet Take 81 mg by mouth daily.  Marland Kitchen atorvastatin (LIPITOR) 10 MG tablet Take 1 tablet (10 mg total) by mouth daily.  . Calcium-Phosphorus-Vitamin D (CALCIUM/D3 ADULT GUMMIES PO) Take 2 tablets daily by mouth. 2 gummies daily with meal  . Cholecalciferol (VITAMIN D PO) Take by mouth.  . clobetasol cream (TEMOVATE) 0.05 % Apply 1 application topically 2 (two) times daily. As needed for skin irritation  . dorzolamide-timolol (COSOPT) 22.3-6.8 MG/ML ophthalmic solution Place 1 drop into the left eye 2 (two) times daily.  . DULoxetine (CYMBALTA) 30 MG capsule Take 1 capsule (30 mg total) by mouth daily.  . hydrocortisone (ANUSOL-HC) 25 MG suppository Place 1 suppository (25 mg total) rectally 3 (three) times daily as needed for hemorrhoids or itching.  Marland Kitchen ipratropium (ATROVENT) 0.06 % nasal spray Place 2 sprays into both nostrils every 8 (eight) hours as needed for rhinitis.  Marland Kitchen latanoprost (XALATAN) 0.005 % ophthalmic solution Place 1 drop into both eyes daily.   Marland Kitchen loratadine (CLARITIN) 10 MG tablet Take 10 mg by mouth daily.  . Multiple Vitamin (MULTIVITAMINS PO) Take by mouth.  . polyethylene glycol (MIRALAX / GLYCOLAX) packet Take 17 g by mouth daily.  . timolol (BETIMOL) 0.5 % ophthalmic solution Place 1 drop into the right eye 2 (two) times daily.  . [DISCONTINUED] meloxicam (MOBIC) 15 MG tablet Take 1 tablet (15 mg total) by mouth daily as needed. for pain. Pt needs F/U appt w/PCP for RFs.  Marland Kitchen meloxicam (MOBIC) 15 MG tablet Take 1 tablet (15 mg total) by mouth daily as needed.  . [DISCONTINUED] albuterol (PROAIR HFA) 108 (90 Base) MCG/ACT inhaler Inhale 2 puffs into the lungs every 6 (six) hours as needed for wheezing or shortness of breath.  . [DISCONTINUED] AMBULATORY NON FORMULARY MEDICATION Tdap vaccine  . [DISCONTINUED] timolol (BETIMOL) 0.5 % ophthalmic solution 1 drop.   No  facility-administered encounter medications on file as of 02/05/2018.        Objective:   Physical Exam  Constitutional: She is oriented to person, place, and time. She appears well-developed and well-nourished.  HENT:  Head: Normocephalic and atraumatic.  Cardiovascular: Normal rate, regular rhythm and normal heart sounds.  No carotid bruits. Radial pulse 2+ bilat  Pulmonary/Chest: Effort normal and breath sounds normal.  Musculoskeletal: She  exhibits no edema.  Neurological: She is alert and oriented to person, place, and time.  Skin: Skin is warm and dry.  Psychiatric: She has a normal mood and affect. Her behavior is normal.        Assessment & Plan:  Fibromyalgia -  Doing well on Cymbalta.  Continue current regimen.    OSA on CPAP - using daily. Follows with Dr. Earl Gala.   Subclinical hypothyroidism- due to recheck TSH and free T4.    OA of hands.  RF meloxicam.  We discussed keeping an eye on her kidney function and checking them at least twice a year.  Her last creatinine looked great at 0.7.  Taking the medication helps with her pain which allows her to knit which she really enjoys.  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 Lorenso Courier.  [facility]

## 2018-02-06 LAB — BASIC METABOLIC PANEL WITH GFR
BUN: 20 mg/dL (ref 7–25)
CO2: 31 mmol/L (ref 20–32)
CREATININE: 0.93 mg/dL (ref 0.60–0.93)
Calcium: 10 mg/dL (ref 8.6–10.4)
Chloride: 105 mmol/L (ref 98–110)
GFR, EST AFRICAN AMERICAN: 68 mL/min/{1.73_m2} (ref >=60)
GFR, Est Non African American: 59 mL/min/{1.73_m2} — ABNORMAL LOW (ref >=60)
GLUCOSE: 92 mg/dL (ref 65–99)
Potassium: 4.2 mmol/L (ref 3.5–5.3)
SODIUM: 142 mmol/L (ref 135–146)

## 2018-02-06 LAB — TSH+FREE T4: TSH W/REFLEX TO FT4: 3.49 m[IU]/L (ref 0.40–4.50)

## 2018-02-07 ENCOUNTER — Ambulatory Visit (INDEPENDENT_AMBULATORY_CARE_PROVIDER_SITE_OTHER): Payer: Medicare Other

## 2018-02-07 DIAGNOSIS — Z1239 Encounter for other screening for malignant neoplasm of breast: Secondary | ICD-10-CM

## 2018-02-07 DIAGNOSIS — Z1231 Encounter for screening mammogram for malignant neoplasm of breast: Secondary | ICD-10-CM | POA: Diagnosis not present

## 2018-02-09 NOTE — Telephone Encounter (Signed)
Noted. I have no objection to patient switching. Sorry she feels this way and if she has any specific concerns to address I do not know know about, she is welcome to contact me. I haven't seen her in almost a year.

## 2018-02-21 ENCOUNTER — Encounter: Payer: Self-pay | Admitting: Emergency Medicine

## 2018-02-21 ENCOUNTER — Ambulatory Visit: Payer: Medicare Other | Admitting: Emergency Medicine

## 2018-02-21 DIAGNOSIS — J449 Chronic obstructive pulmonary disease, unspecified: Secondary | ICD-10-CM

## 2018-02-21 DIAGNOSIS — G4733 Obstructive sleep apnea (adult) (pediatric): Secondary | ICD-10-CM | POA: Diagnosis not present

## 2018-02-21 DIAGNOSIS — Z9989 Dependence on other enabling machines and devices: Secondary | ICD-10-CM | POA: Diagnosis not present

## 2018-02-21 NOTE — Assessment & Plan Note (Signed)
Doing very well.  She tolerates her machine is good with compliance.  Is in good repair.  She has significant clinical benefit.  Follows with Dr. Particia Lather

## 2018-02-21 NOTE — Assessment & Plan Note (Signed)
Mixed obstruction and restriction, also some deconditioning.  I believe at some point she will benefit from the addition of maintenance bronchodilator but she is not ready to do this right now.  She will continue to use albuterol as needed.  Her flu shot is up-to-date, pneumonia shot is up-to-date.  Continue to follow every 6 months and consider daily medication

## 2018-02-21 NOTE — Patient Instructions (Addendum)
Please keep your albuterol available to use 2 puffs as needed for shortness of breath, wheeze or chest tightness.  We may decide to start an everyday inhaler at some point in the future depending on how your breathing is doing.  Please continue to wear your CPAP every night as you have been doing.  Get your flu shot is up to date.  Your Pneumonia shot is up to date.  Follow with Dr Delton Coombes in 6 months or sooner if you have any problems

## 2018-02-21 NOTE — Progress Notes (Signed)
   Subjective:    Patient ID: [name] [name], female    DOB: 27-Aug-1940, 77 y.o.   MRN: [id number]  HPI  ROV 02/21/18 --77 year old woman with a history of COPD, obstructive sleep apnea on CPAP.  Also with a history of fibromyalgia, GERD, osteoporosis. We had also followed small pulmonary nodules for 3 yrs, felt to be benign. She returns today reporting that her breathing has been stable - she can have some SOB with showers, is in humidity, with some home chores. Her allergies become active but she is using her atrovent NS, loratadine. She uses albuterol about a few times a month. She is using CPAP reliably, benefits from it. Has all equipment, in good repair.    Review of Systems  Constitutional: Negative for fever and unexpected weight change.  HENT: Negative for congestion, dental problem, ear pain, nosebleeds, postnasal drip, rhinorrhea, sinus pressure, sneezing, sore throat and trouble swallowing.   Eyes: Negative for redness and itching.  Respiratory: Positive for chest tightness and shortness of breath. Negative for cough and wheezing.   Cardiovascular: Negative for palpitations and leg swelling.  Gastrointestinal: Negative for nausea and vomiting.  Genitourinary: Negative for dysuria.  Musculoskeletal: Negative for joint swelling.  Skin: Negative for rash.  Neurological: Negative for headaches.  Hematological: Does not bruise/bleed easily.  Psychiatric/Behavioral: Negative for dysphoric mood. The patient is not nervous/anxious.        Objective:   Physical Exam Vitals:   02/21/18 1343  BP: 124/86  Pulse: 73  SpO2: 92%  Weight: 74.8 kg  Height: 5\' 2"  (1.575 m)   Gen: Pleasant, well-nourished, in no distress,  normal affect  ENT: No lesions,  mouth clear,  oropharynx clear, no postnasal drip  Neck: No JVD, no stridor  Lungs: No use of accessory muscles, Distant but clear, no wheezing  Cardiovascular: RRR, heart sounds normal, no murmur or gallops, no peripheral  edema  Musculoskeletal: No deformities, no cyanosis or clubbing  Neuro: alert, non focal  Skin: Warm, no lesions or rashes     Assessment & Plan:  COPD (chronic obstructive pulmonary disease) (HCC) Mixed obstruction and restriction, also some deconditioning.  I believe at some point she will benefit from the addition of maintenance bronchodilator but she is not ready to do this right now.  She will continue to use albuterol as needed.  Her flu shot is up-to-date, pneumonia shot is up-to-date.  Continue to follow every 6 months and consider daily medication  Obstructive sleep apnea on CPAP Doing very well.  She tolerates her machine is good with compliance.  Is in good repair.  She has significant clinical benefit.  Follows with Dr. Derek Jack, MD, PhD 02/21/2018, 2:03 PM [facility] [phone] or if no answer [phone]

## 2018-03-19 ENCOUNTER — Other Ambulatory Visit: Payer: Self-pay | Admitting: Osteopathic Medicine

## 2018-04-25 NOTE — Progress Notes (Signed)
Subjective:   [name] [name] is a 77 y.o. female who presents for Medicare Annual (Subsequent) preventive examination.  Review of Systems:  No ROS.  Medicare Wellness Visit. Additional risk factors are reflected in the social history.  Cardiac Risk Factors include: dyslipidemia;advanced age (>49men, >68 women) Sleep patterns: Getting 8 hours of sleep a night. Wakes up feeling rested most of the time. Wears a CPAP machine at night. Home Safety/Smoke Alarms: Feels safe in home. Smoke alarms in place.  Living environment;  Lives with husband in 1 story home. No steps in the home. Shower is a walk in shower with grab bars in place.  Seat Belt Safety/Bike Helmet: Wears seat belt.   Female:   Pap- aged out unless necessary       Mammo-  utd     Dexa scan- utd        CCS- utd     Objective:     Vitals: BP 121/65   Pulse 78   Ht 5\' 2"  (1.575 m)   Wt 164 lb (74.4 kg)   SpO2 97%   BMI 30.00 kg/m   Body mass index is 30 kg/m.  Advanced Directives 04/30/2018 12/08/2016 12/01/2016 08/05/2015  Does Patient Have a Medical Advance Directive? Yes Yes Yes Yes  Type of Estate agent of [place];Living will Living will - -  Does patient want to make changes to medical advance directive? No - Patient declined No - Patient declined - -  Copy of Healthcare Power of Attorney in Chart? No - copy requested No - copy requested No - copy requested (No Data)    Tobacco Social History   Tobacco Use  Smoking Status Former Smoker  . Packs/day: 2.00  . Years: 25.00  . Pack years: 50.00  . Last attempt to quit: 03/07/1981  . Years since quitting: 37.1  Smokeless Tobacco Never Used     Counseling given: Not Answered   Clinical Intake:  Pre-visit preparation completed: Yes  Pain : No/denies pain     Nutritional Risks: None Diabetes: No  How often do you need to have someone help you when you read instructions, pamphlets, or other written materials from your  doctor or pharmacy?: 1 - Never What is the last grade level you completed in school?: 18  Interpreter Needed?: No  Information entered by :: Corrin [name]  Past Medical History:  Diagnosis Date  . Anxiety    intermittent  . blood in stool   . COPD (chronic obstructive pulmonary disease) (HCC)   . Diverticulosis   . Fibromyalgia   . GERD (gastroesophageal reflux disease)   . Glaucoma   . Hemorrhoids   . HSV-2 (herpes simplex virus 2) infection 2009  . OSA on CPAP 07/27/06  . Osteoporosis   . Rectal bleeding    with hemorrhoids   Past Surgical History:  Procedure Laterality Date  . BUNIONECTOMY  [id number]  . CARPAL TUNNEL RELEASE Right 12/08/2016   Procedure: CARPAL TUNNEL RELEASE;  Surgeon: Cindee Salt, MD;  [facility];  Service: Orthopedics;  Laterality: Right;  REG/FAB  . CERVICAL FUSION  2008  . hemorrhoids removed    . TRIGGER FINGER RELEASE  2008   Family History  Problem Relation Age of Onset  . Heart attack Father   . Hyperlipidemia Mother   . Hypertension Mother    Social History   Socioeconomic History  . Marital status: Married    Spouse name: [name]  . Number of  children: 3  . Years of education: 58  . Highest education level: Bachelor's degree (e.g., BA, AB, BS)  Occupational History  . 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 Financial resource strain: Not hard at all  . Food insecurity:    Worry: Never true    Inability: Never true  . Transportation needs:    Medical: No    Non-medical: No  Tobacco Use  . Smoking status: Former Smoker    Packs/day: 2.00    Years: 25.00    Pack years: 50.00    Last attempt to quit: 03/07/1981    Years since quitting: 37.1  . Smokeless tobacco: Never Used  Substance and Sexual Activity  . Alcohol use: Yes    Alcohol/week: 0.0 standard drinks    Comment: 2 drinks per month  . Drug use: No  . Sexual activity: Never  Lifestyle  . Physical activity:     Days per week: 0 days    Minutes per session: 0 min  . Stress: Not at all  Relationships  . Social connections:    Talks on phone: Once a week    Gets together: Once a week    Attends religious service: More than 4 times per year    Active member of club or organization: No    Attends meetings of clubs or organizations: Never    Relationship status: Married  Other Topics Concern  . Not on file  Social History Narrative   : former smoker, tobacco history last updated 10/28/2013. No smoking. No tobacco exposure, no alcohol. No caffeine. No recreational drug use. Wants to get back into the silver sneakers program this coming year.    Outpatient Encounter Medications as of 04/30/2018  Medication Sig  . albuterol (PROAIR HFA) 108 (90 Base) MCG/ACT inhaler Inhale 2 puffs into the lungs every 4 (four) hours as needed for wheezing or shortness of breath.  Marland Kitchen aspirin 81 MG tablet Take 81 mg by mouth daily.  Marland Kitchen atorvastatin (LIPITOR) 10 MG tablet Take 1 tablet (10 mg total) by mouth daily.  . brimonidine (ALPHAGAN) 0.15 % ophthalmic solution Place 1 drop into the left eye 2 (two) times daily.  . Calcium-Phosphorus-Vitamin D (CALCIUM/D3 ADULT GUMMIES PO) Take 2 tablets daily by mouth. 2 gummies daily with meal  . Cholecalciferol (VITAMIN D PO) Take by mouth.  . clobetasol cream (TEMOVATE) 0.05 % Apply 1 application topically 2 (two) times daily. As needed for skin irritation  . dorzolamide-timolol (COSOPT) 22.3-6.8 MG/ML ophthalmic solution Place 1 drop into the left eye 2 (two) times daily.  . DULoxetine (CYMBALTA) 30 MG capsule Take 1 capsule (30 mg total) by mouth daily.  . hydrocortisone (ANUSOL-HC) 25 MG suppository Place 1 suppository (25 mg total) rectally 3 (three) times daily as needed for hemorrhoids or itching.  Marland Kitchen ipratropium (ATROVENT) 0.06 % nasal spray Place 2 sprays into both nostrils every 8 (eight) hours as needed for rhinitis.  Marland Kitchen latanoprost (XALATAN) 0.005 % ophthalmic solution  Place 1 drop into both eyes daily.   Marland Kitchen loratadine (CLARITIN) 10 MG tablet Take 10 mg by mouth daily.  . meloxicam (MOBIC) 15 MG tablet Take 1 tablet (15 mg total) by mouth daily as needed.  . Multiple Vitamin (MULTIVITAMINS PO) Take by mouth.  . polyethylene glycol (MIRALAX / GLYCOLAX) packet Take 17 g by mouth daily.  . timolol (BETIMOL) 0.5 % ophthalmic solution Place 1 drop into the right eye 2 (two) times daily.  No facility-administered encounter medications on file as of 04/30/2018.     Activities of Daily Living In your present state of health, do you have any difficulty performing the following activities: 04/30/2018  Hearing? N  Vision? N  Comment has lost some peripheral vision in thee right eye. Goes back to Dr, Hardie ShackletonHageman next week  Difficulty concentrating or making decisions? N  Walking or climbing stairs? N  Dressing or bathing? N  Doing errands, shopping? N  Preparing Food and eating ? N  Using the Toilet? N  In the past six months, have you accidently leaked urine? N  Do you have problems with loss of bowel control? N  Comment but does have diverticulosis  Managing your Medications? N  Managing your Finances? N  Housekeeping or managing your Housekeeping? N  Some recent data might be hidden    Patient Care Team: Agapito GamesMetheney, Catherine D, MD as PCP - General (Family Medicine) Delton CoombesByrum, Les Pouobert S, MD as Consulting Physician (Pulmonary Disease) Elane FritzBritt, John, MD as Consulting Physician (Otolaryngology) Deretha Emorysborne, James C, MD as Attending Physician (Sleep Medicine) Cain SaupeHageman, Patrick, MD as Referring Physician (Ophthalmology)    Assessment:   This is a routine wellness examination for Myrene BuddyYvonne.Physical assessment deferred to PCP.   Exercise Activities and Dietary recommendations Current Exercise Habits: The patient does not participate in regular exercise at present, Exercise limited by: respiratory conditions(s) Diet Tries to eat healthy with vegetables and fruits and  protein. Breakfast: muffin ot bowl of cereal with juice or coffee Lunch: skips Dinner: Meat and vegetables. Drinks dairy with supper every night.      Goals    . Exercise 3x per week (30 min per time)     Exercise at least 3 times a week for 30 minutes at at time, Start back into  It slowly with the COPD and keep rescue inhaler with you. Wants to get back into the silver sneakers program.       Fall Risk Fall Risk  04/30/2018 02/05/2018 01/19/2017  Falls in the past year? 1 Yes No  Number falls in past yr: 0 1 -  Injury with Fall? 0 No -  Risk for fall due to : Impaired balance/gait - -  Follow up Falls prevention discussed - -   Is the patient's home free of loose throw rugs in walkways, pet beds, electrical cords, etc?   yes      Grab bars in the bathroom? yes      Handrails on the stairs?   no      Adequate lighting?   yes   Depression Screen PHQ 2/9 Scores 04/30/2018 02/05/2018 01/19/2017  PHQ - 2 Score 0 0 0  Exception Documentation Medical reason - -     Cognitive Function     6CIT Screen 04/30/2018  What Year? 0 points  What month? 0 points  What time? 0 points  Count back from 20 0 points  Months in reverse 0 points  Repeat phrase 0 points  Total Score 0    Immunization History  Administered Date(s) Administered  . Influenza, High Dose Seasonal PF 01/19/2017, 02/05/2018  . Influenza,inj,Quad PF,6+ Mos 04/08/2015, 02/04/2016  . Pneumococcal Conjugate-13 04/10/2014  . Pneumococcal Polysaccharide-23 03/17/2004  . Tdap 06/26/2007, 08/15/2017  . Zoster 03/01/2006    Screening Tests Health Maintenance  Topic Date Due  . TETANUS/TDAP  08/16/2027  . INFLUENZA VACCINE  Completed  . DEXA SCAN  Completed  . PNA vac Low Risk Adult  Completed  Plan:      Ms. Elsberry , Thank you for taking time to come for your Medicare Wellness Visit. I appreciate your ongoing commitment to your health goals. Please review the following plan we discussed and let  me know if I can assist you in the future.   Please schedule your next medicare wellness visit with me in 1 yr. Continue doing brain stimulating activities (puzzles, reading, adult coloring books, staying active) to keep memory sharp.    These are the goals we discussed: Goals    . Exercise 3x per week (30 min per time)     Exercise at least 3 times a week for 30 minutes at at time, Start back into  It slowly with the COPD and keep rescue inhaler with you. Wants to get back into the silver sneakers program.       This is a list of the screening recommended for you and due dates:  Health Maintenance  Topic Date Due  . Tetanus Vaccine  08/16/2027  . Flu Shot  Completed  . DEXA scan (bone density measurement)  Completed  . Pneumonia vaccines  Completed      I have personally reviewed and noted the following in the patient's chart:   . Medical and social history . Use of alcohol, tobacco or illicit drugs  . Current medications and supplements . Functional ability and status . Nutritional status . Physical activity . Advanced directives . List of other physicians . Hospitalizations, surgeries, and ER visits in previous 12 months . Vitals . Screenings to include cognitive, depression, and falls . Referrals and appointments  In addition, I have reviewed and discussed with patient certain preventive protocols, quality metrics, and best practice recommendations. A written personalized care plan for preventive services as well as general preventive health recommendations were provided to patient.     Normand Sloop, LPN  [id number]/[id number]

## 2018-04-30 ENCOUNTER — Ambulatory Visit (INDEPENDENT_AMBULATORY_CARE_PROVIDER_SITE_OTHER): Payer: Medicare Other | Admitting: *Deleted

## 2018-04-30 ENCOUNTER — Encounter: Payer: Self-pay | Admitting: Family Medicine

## 2018-04-30 ENCOUNTER — Ambulatory Visit: Payer: Medicare Other | Admitting: Family Medicine

## 2018-04-30 ENCOUNTER — Encounter: Payer: Medicare Other | Admitting: Family Medicine

## 2018-04-30 VITALS — BP 121/65 | HR 78 | Ht 62.0 in | Wt 164.0 lb

## 2018-04-30 DIAGNOSIS — M797 Fibromyalgia: Secondary | ICD-10-CM

## 2018-04-30 DIAGNOSIS — J449 Chronic obstructive pulmonary disease, unspecified: Secondary | ICD-10-CM | POA: Diagnosis not present

## 2018-04-30 DIAGNOSIS — Z0000 Encounter for general adult medical examination without abnormal findings: Secondary | ICD-10-CM

## 2018-04-30 DIAGNOSIS — R635 Abnormal weight gain: Secondary | ICD-10-CM

## 2018-04-30 MED ORDER — CLOBETASOL PROPIONATE 0.05 % EX CREA: 1.0000 "application " | TOPICAL_CREAM | Freq: Two times a day (BID) | CUTANEOUS | 4 refills | Status: DC

## 2018-04-30 MED ORDER — ATORVASTATIN CALCIUM 10 MG PO TABS: 10.0000 mg | ORAL_TABLET | Freq: Every day | ORAL | 3 refills | Status: DC

## 2018-04-30 NOTE — Progress Notes (Signed)
Subjective:    CC: F/U fibromyalgia and weight.   HPI:  77 year old female is here today for follow-up fibromyalgia-she is currently on Cymbalta and then uses meloxicam and Tylenol as needed.  She felt a little worse last week but this week has actually been a little bit better.  She sometimes gets spasms at night but she will usually just use her Tylenol.  She is worried about muscle relaxers being too sedating.  She is really frustrated by her weight gain.  She says she is never weighed this much her whole life.  She admits that she has not been back to the gym in almost a year she was doing Silver sneakers and doing chair yoga.  She said the diagnosis of COPD has just really knocked her for a loop and so she is had a little bit of a difficult time getting started and knowing what to do.  Only eats twice a day and says she does not have a big appetite when she does eat.  COPD -he says 1 albuterol inhaler has actually lasted her a year.  Past medical history, Surgical history, Family history not pertinant except as noted below, Social history, Allergies, and medications have been entered into the medical record, reviewed, and corrections made.   Review of Systems: No fevers, chills, night sweats, weight loss, chest pain, or shortness of breath.   Objective:    General: Well Developed, well nourished, and in no acute distress.  Neuro: Alert and oriented x3, extra-ocular muscles intact, sensation grossly intact.  HEENT: Normocephalic, atraumatic  Skin: Warm and dry, no rashes. Cardiac: Regular rate and rhythm, no murmurs rubs or gallops, no lower extremity edema.    Respiratory: Clear to auscultation bilaterally. Not using accessory muscles, speaking in full sentences.   Impression and Recommendations:   Fibromyalgia- Stable. Overall doing well on conservative regimen.  I do think she would really benefit from getting back into the gym and doing her chair yoga  COPD-discussed may be a  trial of pretreating with albuterol right before her exercise routine to see if this makes a difference in her breathing.  Abnormal weight gain-recent thyroid level looks fantastic at 3.4 which was actually better than the year previous.  She is not currently on medication.  We discussed that she probably are in fact under eats.  Did encourage her to try to at least eat 3 times a day and 1 of those can even just be a protein supplement such as Premier protein drink which is low in carbs but high in protein.  So encouraged her to get back [facility] this helps increase metabolism and helps with weight loss.  She is also interested in prescription weight loss medication. Could consider topirmate.

## 2018-04-30 NOTE — Patient Instructions (Signed)
Ms. [name] [name] , Thank you for taking time to come for your Medicare Wellness Visit. I appreciate your ongoing commitment to your health goals. Please review the following plan we discussed and let me know if I can assist you in the future.   Please schedule your next medicare wellness visit with me in 1 yr. Continue doing brain stimulating activities (puzzles, reading, adult coloring books, staying active) to keep memory sharp.   These are the goals we discussed: Goals    . Exercise 3x per week (30 min per time)     Exercise at least 3 times a week for 30 minutes at at time, Start back into  It slowly with the COPD and keep rescue inhaler with you. Wants to get back into the silver sneakers program.      Continue knitting and doing crossword puzzles. Your doing a great job.  Merry Christmas

## 2018-04-30 NOTE — Patient Instructions (Signed)
Recommend try Premier Protein drinks.  They can be found at most grocery stores and [employer].  This can be used as a meal replacement either as breakfast or even as a lunch.  Help keep up the protein levels and help give you a little bit more energy throughout the day.  I do suspect you are probably under eating.  Do try to limit carbohydrate intake and really increase vegetable intake as well as trying to get some form of protein with each meal that could include meats, nuts, and legumes.  Even things like [place] yogurt are high in protein and do not have a lot of sugar and can be a quick snack that is helpful as well. Do encourage you to get back into the [facility] Silver sneakers and especially the chair yoga I think this would be fantastic for your fibromyalgia.

## 2018-05-04 ENCOUNTER — Other Ambulatory Visit: Payer: Self-pay

## 2018-05-04 DIAGNOSIS — M19049 Primary osteoarthritis, unspecified hand: Secondary | ICD-10-CM

## 2018-05-04 DIAGNOSIS — M25551 Pain in right hip: Secondary | ICD-10-CM

## 2018-05-04 MED ORDER — DULOXETINE HCL 30 MG PO CPEP: 30.0000 mg | ORAL_CAPSULE | Freq: Every day | ORAL | 1 refills | Status: DC

## 2018-05-04 MED ORDER — MELOXICAM 15 MG PO TABS: 15.0000 mg | ORAL_TABLET | Freq: Every day | ORAL | 1 refills | Status: DC | PRN

## 2018-05-04 MED ORDER — ATORVASTATIN CALCIUM 10 MG PO TABS: 10.0000 mg | ORAL_TABLET | Freq: Every day | ORAL | 0 refills | Status: DC

## 2018-06-05 ENCOUNTER — Encounter: Payer: Self-pay | Admitting: Family Medicine

## 2018-06-05 ENCOUNTER — Ambulatory Visit: Payer: Medicare Other | Admitting: Family Medicine

## 2018-06-05 VITALS — BP 135/64 | HR 71 | Ht 62.0 in | Wt 161.0 lb

## 2018-06-05 DIAGNOSIS — E039 Hypothyroidism, unspecified: Secondary | ICD-10-CM | POA: Diagnosis not present

## 2018-06-05 DIAGNOSIS — I2510 Atherosclerotic heart disease of native coronary artery without angina pectoris: Secondary | ICD-10-CM | POA: Diagnosis not present

## 2018-06-05 DIAGNOSIS — R635 Abnormal weight gain: Secondary | ICD-10-CM | POA: Diagnosis not present

## 2018-06-05 DIAGNOSIS — E038 Other specified hypothyroidism: Secondary | ICD-10-CM

## 2018-06-05 DIAGNOSIS — M797 Fibromyalgia: Secondary | ICD-10-CM

## 2018-06-05 DIAGNOSIS — E782 Mixed hyperlipidemia: Secondary | ICD-10-CM

## 2018-06-05 LAB — COMPLETE METABOLIC PANEL WITH GFR
AG Ratio: 1.3 (calc) (ref 1.0–2.5)
ALKALINE PHOSPHATASE (APISO): 78 U/L (ref 33–130)
ALT: 15 U/L (ref 6–29)
AST: 19 U/L (ref 10–35)
Albumin: 4.2 g/dL (ref 3.6–5.1)
BUN: 21 mg/dL (ref 7–25)
CO2: 29 mmol/L (ref 20–32)
Calcium: 10 mg/dL (ref 8.6–10.4)
Chloride: 106 mmol/L (ref 98–110)
Creat: 0.92 mg/dL (ref 0.60–0.93)
GFR, Est African American: 69 mL/min/{1.73_m2} (ref >=60)
GFR, Est Non African American: 59 mL/min/{1.73_m2} — ABNORMAL LOW (ref >=60)
Globulin: 3.2 g/dL (calc) (ref 1.9–3.7)
Glucose, Bld: 104 mg/dL — ABNORMAL HIGH (ref 65–99)
Potassium: 4.4 mmol/L (ref 3.5–5.3)
Sodium: 142 mmol/L (ref 135–146)
Total Bilirubin: 1.1 mg/dL (ref 0.2–1.2)
Total Protein: 7.4 g/dL (ref 6.1–8.1)

## 2018-06-05 LAB — T4, FREE: Free T4: 1.1 ng/dL (ref 0.8–1.8)

## 2018-06-05 LAB — LIPID PANEL
Cholesterol: 157 mg/dL (ref <200)
HDL: 56 mg/dL (ref >50)
LDL Cholesterol (Calc): 79 mg/dL (calc)
Non-HDL Cholesterol (Calc): 101 mg/dL (calc) (ref <130)
TRIGLYCERIDES: 121 mg/dL (ref <150)
Total CHOL/HDL Ratio: 2.8 (calc) (ref <5.0)

## 2018-06-05 LAB — TSH: TSH: 8.17 mIU/L — ABNORMAL HIGH (ref 0.40–4.50)

## 2018-06-05 MED ORDER — ATORVASTATIN CALCIUM 10 MG PO TABS: 10.0000 mg | ORAL_TABLET | Freq: Every day | ORAL | 3 refills | Status: DC

## 2018-06-05 NOTE — Progress Notes (Signed)
Subjective:    CC: Fibromyalgia  HPI:   Patient is a 78 year old female who is here today to follow-up for abnormal weight gaine.  When I last saw her I had encouraged her to really work diligently on trying to eat at least 3 meals a day and maybe even supplementing with a protein drink.  Did encourage her to try to increase her protein overall.  Also encouraged her to get back [facility]. She has lost about 3 lbs.    Unfortunately since I last saw her she has started to have a flare with her fibromyalgia.  Is on Cymbalta daily and usually uses meloxicam and Tylenol as needed.  She says it is mostly affecting the right side of her neck going down towards her right shoulder.  Just feels like a tightness she is noticing she is has slightly decreased range of motion.  She has had this before.  She also has a history of subclinical hypothyroidism.  No recent symptoms but she is due to recheck her thyroid levels.  Hyperlipidemia with elevated triglyceride levels-she is currently on atorvastatin 10 mg daily.  Due to recheck lipid panel and liver enzymes.  Past medical history, Surgical history, Family history not pertinant except as noted below, Social history, Allergies, and medications have been entered into the medical record, reviewed, and corrections made.   Review of Systems: No fevers, chills, night sweats, weight loss, chest pain, or shortness of breath.   Objective:    General: Well Developed, well nourished, and in no acute distress.  Neuro: Alert and oriented x3, extra-ocular muscles intact, sensation grossly intact.  HEENT: Normocephalic, atraumatic  Skin: Warm and dry, no rashes. Cardiac: Regular rate and rhythm, no murmurs rubs or gallops, no lower extremity edema.  Respiratory: Clear to auscultation bilaterally. Not using accessory muscles, speaking in full sentences. MSK: Normal cervical flexion and extension.  Decreased rotation and side bending to the  right compared to the left.  Somewhat tender over the upper trapezius muscles.   Impression and Recommendations:   Abnormal weight gain -is actually lost about 3 pounds which is fantastic.  Though she is had some major dental work done and says she has not been eating very much which she thinks could be contributing to the weight loss.  She has not [facility] so just encouraged her to work on that.   Fibromyalgia -stable.  She has had more pain in the right side of her neck towards her shoulder which I think is more related to cervical spasm.  She has a TENS unit at home so just encouraged her to use that as well as using her stretches or Tylenol and ice and/or heat whichever feels better if she is not improving in the next couple of weeks to let us know.  Right-sided cervical strain-see note above.  Elevated creatinine-due to recheck renal function.  Hyperlipidemia-due to recheck lipids and liver enzymes. RF sent.

## 2018-06-07 MED ORDER — LEVOTHYROXINE SODIUM 25 MCG PO TABS: 25.0000 ug | ORAL_TABLET | Freq: Every day | ORAL | 1 refills | Status: DC

## 2018-06-07 NOTE — Addendum Note (Signed)
Addended by: Nani Gasser D on: 06/07/2018 09:14 PM   Modules accepted: Orders

## 2018-06-08 ENCOUNTER — Other Ambulatory Visit: Payer: Self-pay | Admitting: *Deleted

## 2018-06-08 DIAGNOSIS — E038 Other specified hypothyroidism: Secondary | ICD-10-CM

## 2018-06-08 DIAGNOSIS — E039 Hypothyroidism, unspecified: Secondary | ICD-10-CM

## 2018-07-03 ENCOUNTER — Other Ambulatory Visit: Payer: Self-pay | Admitting: Family Medicine

## 2018-08-01 ENCOUNTER — Other Ambulatory Visit: Payer: Self-pay | Admitting: Family Medicine

## 2018-08-06 ENCOUNTER — Other Ambulatory Visit: Payer: Self-pay | Admitting: Emergency Medicine

## 2018-08-08 ENCOUNTER — Telehealth: Payer: Self-pay | Admitting: Emergency Medicine

## 2018-08-08 NOTE — Telephone Encounter (Signed)
ATCx3 patient regarding rescheduling of her ROV 08/20/2018 due to provider not being in-office at that time. Sent a MyChart message informing her to give our office a call back. Nothing further needed at this time.

## 2018-08-10 NOTE — Telephone Encounter (Signed)
Called pt to respond to MyChart message. Pt states her breathing has been more labored since LOV. Denies fever, chills, or sweats. States her current inhaler is not working well for her. She agreed to scheduling a televisit with NP for 08/13/2018 at 1:30 PM. I also rescheduled her original appt w/ Dr. Delton Coombes to 12/04/2018 at 1:30 PM per COVID-19 protocol. Appts have been scheduled. Nothing further needed at this time.

## 2018-08-13 ENCOUNTER — Encounter: Payer: Self-pay | Admitting: Pulmonary Disease

## 2018-08-13 ENCOUNTER — Ambulatory Visit (INDEPENDENT_AMBULATORY_CARE_PROVIDER_SITE_OTHER): Payer: Medicare Other | Admitting: Pulmonary Disease

## 2018-08-13 ENCOUNTER — Other Ambulatory Visit: Payer: Self-pay

## 2018-08-13 DIAGNOSIS — G4733 Obstructive sleep apnea (adult) (pediatric): Secondary | ICD-10-CM | POA: Diagnosis not present

## 2018-08-13 DIAGNOSIS — J432 Centrilobular emphysema: Secondary | ICD-10-CM | POA: Diagnosis not present

## 2018-08-13 DIAGNOSIS — J309 Allergic rhinitis, unspecified: Secondary | ICD-10-CM

## 2018-08-13 DIAGNOSIS — Z9989 Dependence on other enabling machines and devices: Secondary | ICD-10-CM | POA: Diagnosis not present

## 2018-08-13 DIAGNOSIS — J984 Other disorders of lung: Secondary | ICD-10-CM

## 2018-08-13 MED ORDER — PREDNISONE 10 MG PO TABS: ORAL_TABLET | ORAL | 0 refills | Status: DC

## 2018-08-13 MED ORDER — IPRATROPIUM BROMIDE 0.06 % NA SOLN: 2.0000 | Freq: Three times a day (TID) | NASAL | 6 refills | Status: DC | PRN

## 2018-08-13 NOTE — Assessment & Plan Note (Signed)
Assessment: Managed on CPAP by Dr. Earl Gala  Plan: Continue CPAP therapy as outlined by Dr. Earl Gala

## 2018-08-13 NOTE — Progress Notes (Signed)
Virtual Visit via Telephone Note  I connected with AUDRIANA SIGNOR on 08/13/18 at  1:30 PM EDT by telephone and verified that I am speaking with the correct person using two identifiers.   I discussed the limitations, risks, security and privacy concerns of performing an evaluation and management service by telephone and the availability of in person appointments. I also discussed with the patient that there may be a patient responsible charge related to this service. The patient expressed understanding and agreed to proceed.  08/13/2018 - 1257 - Attempted to reach pt / LVM  08/13/2018 - 1300 - Reached   History of Present Illness: 78 year old woman with a history of COPD/ Emphysema and pulmonary nodules (3 yr stability on recent CT - felt to be benign)  PMH: obstructive sleep apnea on CPAP (managed by Dr. Particia Lather), history of fibromyalgia, GERD, osteoporosis.   Patient consented to consult via telephone: Yes People present and their role in pt care: Pt   Chief complaint: COPD, emphysema  78 year old female former smoker followed in our office for emphysema, restrictive lung disease, allergic rhinitis.  Patient reports that for the last month she has had worsening shortness of breath and wheezing.  She also has had to use her rescue inhaler 2-3 times daily.  Patient is not on a maintenance inhaler.  She has tried to avoid maintenance inhalers in the past.  She knows that she may need to consider starting one soon.  Patient endorses that she continues to have allergy symptoms.  She is dry eyes, clear nasal drainage, postnasal drip.  She has a productive cough that brings up clear mucus.  She reports she is adherent to Claritin and just recently refilled her Atrovent nasal spray.  She is not doing nasal saline rinses.  MMRC - Breathlessness Score 3 - I stop for breath after walking about 100 yards or after a few minutes on level ground (isle at grocery store is 175ft)      Observations/Objective:  01/25/2017-CT chest high-res- greater than 3-year stability of scattered small solid pulmonary nodules considered benign, no evidence of ILD, mild centrilobular emphysema and mild diffuse bronchial wall thickening suggesting COPD  04/07/2017-pulmonary function test- FVC 1.52 (59% predicted), postbronchodilator ratio 76, FEV1 1.16 (60% predicted), no bronchodilator response, mid flow reversibility, DLCO 57  No results found for: NITRICOXIDE  Assessment and Plan:  Restrictive lung disease Assessment: Restriction on November/2018 PFT Likely due to weight and deconditioning mMRC 3 today  Plan: Consider maintenance inhaler at next office visit as today is a tele-visit and difficult to teach patient inhaler regimen  Allergic rhinitis Assessment: Current allergy symptoms Managed on Claritin and Atrovent nasal spray Not doing nasal saline rinses  Plan: Prednisone taper Continue daily Claritin Continue Atrovent nasal spray Consider starting chlor tabs at night for management of postnasal drip Start nasal saline rinses at least 2 times daily  Obstructive sleep apnea on CPAP Assessment: Managed on CPAP by Dr. Earl Gala  Plan: Continue CPAP therapy as outlined by Dr. Franco Nones emphysema [facility]) Assessment: September/2018 CT shows mild centrilobular emphysema November/2018 PFT shows DLCO 57, as well as restriction Patient is not on a maintenance inhaler mMRC 3 today Using rescue inhaler 2-3 times daily for the last month Patient reporting increased wheezing, allergy-like symptoms  Plan: Suspect that patient likely is having flare of allergic rhinitis that may be causing a COPD exacerbation  Prednisone taper today Hold off on antibiotic coverage at this time, as patient is afebrile and not having  purulent sputum Patient to contact her insurance company to figure out what is on formulary >>>I suspect she will likely need a maintenance inhaler  in the future, Dr. Delton CoombesByrum thought the same at last office visit >>>Patient likely needs at least a [place] if not [place]laba inhaler Continue daily Claritin Start nasal saline rinses Consider chlor tabs for postnasal drip Continue Atrovent nasal spray Keep scheduled follow-up with Dr. Delton CoombesByrum in July/2020      Follow Up Instructions:  Return in about 3 months (around 11/12/2018), or if symptoms worsen or fail to improve, for Follow up with Dr. Delton CoombesByrum.    I discussed the assessment and treatment plan with the patient. The patient was provided an opportunity to ask questions and all were answered. The patient agreed with the plan and demonstrated an understanding of the instructions.   The patient was advised to call back or seek an in-person evaluation if the symptoms worsen or if the condition fails to improve as anticipated.  I provided 22 minutes of non-face-to-face time during this encounter.   Coral CeoBrian P Deaisha Welborn, NP

## 2018-08-13 NOTE — Assessment & Plan Note (Signed)
Assessment: Restriction on November/2018 PFT Likely due to weight and deconditioning mMRC 3 today  Plan: Consider maintenance inhaler at next office visit as today is a tele-visit and difficult to teach patient inhaler regimen

## 2018-08-13 NOTE — Assessment & Plan Note (Addendum)
Assessment: September/2018 CT shows mild centrilobular emphysema November/2018 PFT shows DLCO 57, as well as restriction Patient is not on a maintenance inhaler mMRC 3 today Using rescue inhaler 2-3 times daily for the last month Patient reporting increased wheezing, allergy-like symptoms  Plan: Suspect that patient likely is having flare of allergic rhinitis that may be causing a COPD exacerbation  Prednisone taper today Hold off on antibiotic coverage at this time, as patient is afebrile and not having purulent sputum Patient to contact her insurance company to figure out what is on formulary >>>I suspect she will likely need a maintenance inhaler in the future, Dr. Delton Coombes thought the same at last office visit >>>Patient likely needs at least a [place] if not [place]/laba inhaler Continue daily Claritin Start nasal saline rinses Consider chlor tabs for postnasal drip Continue Atrovent nasal spray Keep scheduled follow-up with Dr. Delton Coombes in July/2020

## 2018-08-13 NOTE — Assessment & Plan Note (Signed)
Assessment: Current allergy symptoms Managed on Claritin and Atrovent nasal spray Not doing nasal saline rinses  Plan: Prednisone taper Continue daily Claritin Continue Atrovent nasal spray Consider starting chlor tabs at night for management of postnasal drip Start nasal saline rinses at least 2 times daily

## 2018-08-13 NOTE — Patient Instructions (Addendum)
Prednisone 10mg  tablet  >>>4 tabs for 2 days, then 3 tabs for 2 days, 2 tabs for 2 days, then 1 tab for 2 days, then stop >>>take with food  >>>take in the morning   Only use your albuterol as a rescue medication to be used if you can't catch your breath by resting or doing a relaxed purse lip breathing pattern.  - The less you use it, the better it will work when you need it. - Ok to use up to 2 puffs  every 4 hours if you must but call for immediate appointment if use goes up over your usual need - Don't leave home without it !!  (think of it like the spare tire for your car)   Continue daily Claritin Start nasal saline rinses twice daily Continue Atrovent nasal spray I have refilled Atrovent nasal spray  If postnasal drip persists despite these treatments then please start taking chlorpheniramine (aka Chlor tabs) 4 mg tablet (1 to 2 tablets at night) for management of allergies and postnasal drip at night >>> This is an over-the-counter medication >>> This medication is sedating    We recommend that you continue using your CPAP daily >>>Keep up the hard work using your device >>> Goal should be wearing this for the entire night that you are sleeping, at least 4 to 6 hours  Remember:  . Do not drive or operate heavy machinery if tired or drowsy.  . Please notify the supply company and office if you are unable to use your device regularly due to missing supplies or machine being broken.  . Work on maintaining a healthy weight and following your recommended nutrition plan  . Maintain proper daily exercise and movement  . Maintaining proper use of your device can also help improve management of other chronic illnesses such as: Blood pressure, blood sugars, and weight management.   BiPAP/ CPAP Cleaning:  >>>Clean weekly, with Dawn soap, and bottle brush.  Set up to air dry.    Keep scheduled follow-up with Dr. Delton Coombes in July/2020 >>> Before this office visit please contact your  insurance company and see what medications are formulary for them for a maintenance inhaler >>> The type of inhaler that you are looking for something called a [place] or a [place] >>> Examples of these inhalers are Spiriva Respimat, Stiolto Respimat, Bevespi, Anoro Ellipta, Incruse Ellipta >>> Right down what ever is on formulary per your insurance company and bring that to the next office visit as we may be able to start you on the medication that day    Coronavirus (COVID-19) Are you at risk?  Are you at risk for the Coronavirus (COVID-19)?  To be considered HIGH RISK for Coronavirus (COVID-19), you have to meet the following criteria:  . Traveled to [place], [place], [place], [place] or [place]; or in the [place] to [place], [place], [place], or [place]; and have fever, cough, and shortness of breath within the last 2 weeks of travel OR . Been in close contact with a person diagnosed with COVID-19 within the last 2 weeks and have fever, cough, and shortness of breath . IF YOU DO NOT MEET THESE CRITERIA, YOU ARE CONSIDERED LOW RISK FOR COVID-19.  What to do if you are HIGH RISK for COVID-19?  Marland Kitchen If you are having a medical emergency, call 911. . Seek medical care right away. Before you go to a doctor's office, urgent care or emergency department, call ahead and tell them  about your recent travel, contact with someone diagnosed with COVID-19, and your symptoms. You should receive instructions from your physician's office regarding next steps of care.  . When you arrive at healthcare provider, tell the healthcare staff immediately you have returned from visiting [place], [place], [place], [place] or [place]; or traveled in the [place] to [place], [place], [place], or [place]; in the last two weeks or you have been in close contact with a person diagnosed with COVID-19 in the last 2 weeks.   . Tell the health care staff about your symptoms: fever, cough and shortness of  breath. . After you have been seen by a medical provider, you will be either: o Tested for (COVID-19) and discharged home on quarantine except to seek medical care if symptoms worsen, and asked to  - Stay home and avoid contact with others until you get your results (4-5 days)  - Avoid travel on public transportation if possible (such as bus, train, or airplane) or o Sent to the Emergency Department by EMS for evaluation, COVID-19 testing, and possible admission depending on your condition and test results.  What to do if you are LOW RISK for COVID-19?  Reduce your risk of any infection by using the same precautions used for avoiding the common cold or flu:  Marland Kitchen Wash your hands often with soap and warm water for at least 20 seconds.  If soap and water are not readily available, use an alcohol-based hand sanitizer with at least 60% alcohol.  . If coughing or sneezing, cover your mouth and nose by coughing or sneezing into the elbow areas of your shirt or coat, into a tissue or into your sleeve (not your hands). . Avoid shaking hands with others and consider head nods or verbal greetings only. . Avoid touching your eyes, nose, or mouth with unwashed hands.  . Avoid close contact with people who are sick. . Avoid places or events with large numbers of people in one location, like concerts or sporting events. . Carefully consider travel plans you have or are making. . If you are planning any travel outside or inside the [place], visit the CDC's Travelers' Health webpage for the latest health notices. . If you have some symptoms but not all symptoms, continue to monitor at home and seek medical attention if your symptoms worsen. . If you are having a medical emergency, call 911.   ADDITIONAL HEALTHCARE OPTIONS FOR PATIENTS  [facility] / e-Visit: [email or web address]         [facility]: [phone]  [facility]: [phone]                    [facility]: [phone]           It is flu season:   >>> Best ways to protect herself from the flu: Receive the yearly flu vaccine, practice good hand hygiene washing with soap and also using hand sanitizer when available, eat a nutritious meals, get adequate rest, hydrate appropriately   Please contact the office if your symptoms worsen or you have concerns that you are not improving.   Thank you for choosing [facility] for your healthcare, and for allowing [place] to partner with you on your healthcare journey. I am thankful to be able to provide care to you today.   Elisha Headland FNP-C    Allergic Rhinitis, Adult Allergic rhinitis is a reaction to allergens in the air.  Allergens are tiny specks (particles) in the air that cause your body to have an allergic reaction. This condition cannot be passed from person to person (is not contagious). Allergic rhinitis cannot be cured, but it can be controlled. There are two types of allergic rhinitis:  Seasonal. This type is also called hay fever. It happens only during certain times of the year.  Perennial. This type can happen at any time of the year. What are the causes? This condition may be caused by:  Pollen from grasses, trees, and weeds.  House dust mites.  Pet dander.  Mold. What are the signs or symptoms? Symptoms of this condition include:  Sneezing.  Runny or stuffy nose (nasal congestion).  A lot of mucus in the back of the throat (postnasal drip).  Itchy nose.  Tearing of the eyes.  Trouble sleeping.  Being sleepy during day. How is this treated? There is no cure for this condition. You should avoid things that trigger your symptoms (allergens). Treatment can help to relieve symptoms. This may include:  Medicines that block allergy symptoms, such as antihistamines. These may be given as a shot, nasal spray, or pill.  Shots that are given until your body  becomes less sensitive to the allergen (desensitization).  Stronger medicines, if all other treatments have not worked. Follow these instructions at home: Avoiding allergens   Find out what you are allergic to. Common allergens include smoke, dust, and pollen.  Avoid them if you can. These are some of the things that you can do to avoid allergens: ? Replace carpet with wood, tile, or vinyl flooring. Carpet can trap dander and dust. ? Clean any mold found in the home. ? Do not smoke. Do not allow smoking in your home. ? Change your heating and air conditioning filter at least once a month. ? During allergy season:  Keep windows closed as much as you can. If possible, use air conditioning when there is a lot of pollen in the air.  Use a special filter for allergies with your furnace and air conditioner.  Plan outdoor activities when pollen counts are lowest. This is usually during the early morning or evening hours.  If you do go outdoors when pollen count is high, wear a special mask for people with allergies.  When you come indoors, take a shower and change your clothes before sitting on furniture or bedding. General instructions  Do not use fans in your home.  Do not hang clothes outside to dry.  Wear sunglasses to keep pollen out of your eyes.  Wash your hands right away after you touch household pets.  Take over-the-counter and prescription medicines only as told by your doctor.  Keep all follow-up visits as told by your doctor. This is important. Contact a doctor if:  You have a fever.  You have a cough that does not go away (is persistent).  You start to make whistling sounds when you breathe (wheeze).  Your symptoms do not get better with treatment.  You have thick fluid coming from your nose.  You start to have nosebleeds. Get help right away if:  Your tongue or your lips are swollen.  You have trouble breathing.  You feel dizzy or you feel like you are  going to pass out (faint).  You have cold sweats. Summary  Allergic rhinitis is a reaction to allergens in the air.  This condition may be caused by allergens. These include pollen, dust mites, pet dander, and mold.  Symptoms include a runny, itchy nose, sneezing, or tearing eyes. You may also have trouble sleeping or feel sleepy during the day.  Treatment includes taking medicines and avoiding allergens. You may also get shots or take stronger medicines.  Get help if you have a fever or a cough that does not stop. Get help right away if you are short of breath. This information is not intended to replace advice given to you by your health care provider. Make sure you discuss any questions you have with your health care provider. Document Released: 08/25/2010 Document Revised: 11/14/2017 Document Reviewed: 11/14/2017 Elsevier Interactive Patient Education  2019 [employer]

## 2018-08-20 ENCOUNTER — Ambulatory Visit: Payer: Medicare Other | Admitting: Emergency Medicine

## 2018-09-04 ENCOUNTER — Ambulatory Visit (INDEPENDENT_AMBULATORY_CARE_PROVIDER_SITE_OTHER): Payer: Medicare Other | Admitting: Family Medicine

## 2018-09-04 ENCOUNTER — Encounter: Payer: Self-pay | Admitting: Family Medicine

## 2018-09-04 VITALS — Temp 97.7°F | Ht 62.0 in | Wt 165.0 lb

## 2018-09-04 DIAGNOSIS — E038 Other specified hypothyroidism: Secondary | ICD-10-CM

## 2018-09-04 DIAGNOSIS — E039 Hypothyroidism, unspecified: Secondary | ICD-10-CM | POA: Diagnosis not present

## 2018-09-04 DIAGNOSIS — M797 Fibromyalgia: Secondary | ICD-10-CM | POA: Diagnosis not present

## 2018-09-04 DIAGNOSIS — R0789 Other chest pain: Secondary | ICD-10-CM

## 2018-09-04 NOTE — Progress Notes (Signed)
Virtual Visit via Video Note  I connected with [name] [name] on 09/04/18 at 10:50 AM EDT by a video enabled telemedicine application and verified that I am speaking with the correct person using two identifiers.   I discussed the limitations of evaluation and management by telemedicine and the availability of in person appointments. The patient expressed understanding and agreed to proceed.  Pt was at home and I was in my office for the visit.    Subjective:    CC: F/u fibro, 4 mo f/u   HPI:  F/u fibromyalgia - she is doing well. She I on her Cymbalta.  She has been ding a lot of sewing and knitting.  She had a bad flare when I saw her last time.  Ok uses tylenol as well.   F/u subclinical hypothyroidism - she is taking her medication regularly. She wants to know if she should continue taking it. Hasn't had labs yet.    Over 5-6 weeks has had some intermittant pain in her right ear when she has laid down to go to bed. . Radiates into her neck and into her chest.  Can last 10 min or less.  Last time happened was 5 days. No SOB.  No sweating.  She says if puts her CPAP on it does help.  In between the breast area. She is on ASA an dstatin.  No GERD sxs at all.  No drianage from her ear. No URI sxs.   Usually takes 2 tylenol at bedtime to sleep occasionally if having more pain.     Past medical history, Surgical history, Family history not pertinant except as noted below, Social history, Allergies, and medications have been entered into the medical record, reviewed, and corrections made.   Review of Systems: No fevers, chills, night sweats, weight loss, chest pain, or shortness of breath.   Objective:    General: Speaking clearly in complete sentences without any shortness of breath.  Alert and oriented x3.  Normal judgment. No apparent acute distress.    Impression and Recommendations:    Atypical Chest pain - not with activity but based on age and risk I think we should have  her come in the office for EKG and additional work up and labs. Check TSh and CBC and BMP.  Will checktroponin when comes in tomorrow as well.  She denies any GERD sxs.    Fibromyalgaia - Stable. Will continue with current regimen.  F/U in 6 months. Make sure staying active.      Subclinical hypothyroid - plan to recheck TSH and make a call for continuing or discontinuing medication. Also based on her clinical benefit.        I discussed the assessment and treatment plan with the patient. The patient was provided an opportunity to ask questions and all were answered. The patient agreed with the plan and demonstrated an understanding of the instructions.   The patient was advised to call back or seek an in-person evaluation if the symptoms worsen or if the condition fails to improve as anticipated.   Nani Gasser, MD

## 2018-09-04 NOTE — Progress Notes (Signed)
Pt wanted to know if Dr. Linford Arnold wants her to continue taking the levothyroxine?  She was to go have her labs rechecked back in January. She did not go have this done, I told her that she would need to have the labs check before this decision could be made, she was agreeable to this.[name] [name], Viann Shove, CMA

## 2018-09-05 ENCOUNTER — Encounter: Payer: Self-pay | Admitting: Family Medicine

## 2018-09-05 ENCOUNTER — Ambulatory Visit: Payer: Medicare Other | Admitting: Family Medicine

## 2018-09-05 VITALS — BP 116/84 | HR 77 | Temp 99.1°F | Ht 62.0 in | Wt 163.0 lb

## 2018-09-05 DIAGNOSIS — E782 Mixed hyperlipidemia: Secondary | ICD-10-CM

## 2018-09-05 DIAGNOSIS — J432 Centrilobular emphysema: Secondary | ICD-10-CM | POA: Diagnosis not present

## 2018-09-05 DIAGNOSIS — R0789 Other chest pain: Secondary | ICD-10-CM

## 2018-09-05 DIAGNOSIS — I2510 Atherosclerotic heart disease of native coronary artery without angina pectoris: Secondary | ICD-10-CM

## 2018-09-05 MED ORDER — UMECLIDINIUM BROMIDE 62.5 MCG/INH IN AEPB: 1.0000 | INHALATION_SPRAY | Freq: Every day | RESPIRATORY_TRACT | 0 refills | Status: DC

## 2018-09-05 NOTE — Progress Notes (Signed)
   Subjective:    CC: Follow-up from ED visit yesterday where patient had complained of new onset chest pain.  HPI: Follow-up from ED visit yesterday where patient had complained of new onset chest pain.  I had asked her to come in today so that we could evaluate her further with an EKG.  She does have evidence of coronary artery disease and one-vessel on CT.  No prior history of an MI..  Over 5-6 weeks has had some intermittant pain in her right ear when she has laid down to go to bed. . Radiates into her neck and into her chest.  Can last 10 min or less.  Last time happened was 5 days. No SOB.  No sweating.  She says if puts her CPAP on it does help.  In between the breast area. She is on ASA and statin.  No GERD sxs at all.  No drianage from her ear. No URI sxs.   COPD - she is doing well overall but had a flare ealier this month and did a virtual visit w/ Dr. Kavin Leech NP and was treated with Steroids for about 5 days. She does feel better overall but days daily feels like she has a notable change in her breathing. She had a hard time describing it. Says feels like it takes more effort to take a deep breath. Says dr. Delton Coombes had mentioned putting her on a daily inhaler at last OV last fall.     Past medical history, Surgical history, Family history not pertinant except as noted below, Social history, Allergies, and medications have been entered into the medical record, reviewed, and corrections made.   Review of Systems: No fevers, chills, night sweats, weight loss, chest pain, or shortness of breath.   Objective:   Physical Exam Constitutional:      Appearance: She is well-developed.  HENT:     Head: Normocephalic and atraumatic.  Eyes:     Extraocular Movements: Extraocular movements intact.  Neck:     Comments: No carotid bruits Cardiovascular:     Rate and Rhythm: Normal rate and regular rhythm.     Heart sounds: Normal heart sounds.  Pulmonary:     Effort: Pulmonary effort is  normal.     Breath sounds: Normal breath sounds.  Musculoskeletal:     Comments: No LE edema  Skin:    General: Skin is warm and dry.  Neurological:     Mental Status: She is alert and oriented to person, place, and time.  Psychiatric:        Behavior: Behavior normal.      Impression and Recommendations:    Atypical chest pain -continue aspirin and statin. EKG peformed today shows rate of 78 bpm, no acute changes. No ST-T wave elevateion. No change from previous EKG from 2018 . Discussed could be gastritis. She takes and NSAID daily and took Prednisone earlier this month. Not on a PPI ro H2 blocker. She feels the mobic isn't really helping her so she says she will stop it instead of taking a med to protect her stomach. Will see if CP episodes resolve. Will get labs to rule out anemia, thyroid d/o, trop, etc.   COPD - will start Incruse. Demonstrated how to use the inhalers.  Call if not covered on her insurance plan.  F/U with Dr. Delton Coombes this summer as scheduled.  Ok ot use the albuterol PRN.       Nani Gasser, MD

## 2018-09-05 NOTE — Patient Instructions (Signed)
If the Incruse is working well then let me know if a couple of weeks and we can send to your mail order.

## 2018-09-06 ENCOUNTER — Encounter: Payer: Self-pay | Admitting: Family Medicine

## 2018-09-06 ENCOUNTER — Other Ambulatory Visit: Payer: Self-pay | Admitting: *Deleted

## 2018-09-06 DIAGNOSIS — E039 Hypothyroidism, unspecified: Secondary | ICD-10-CM

## 2018-09-06 DIAGNOSIS — N183 Chronic kidney disease, stage 3 unspecified: Secondary | ICD-10-CM | POA: Insufficient documentation

## 2018-09-06 DIAGNOSIS — E038 Other specified hypothyroidism: Secondary | ICD-10-CM

## 2018-09-06 LAB — CBC WITH DIFFERENTIAL/PLATELET
Absolute Monocytes: 550 cells/uL (ref 200–950)
Basophils Absolute: 38 cells/uL (ref 0–200)
Basophils Relative: 0.8 %
Eosinophils Absolute: 169 cells/uL (ref 15–500)
Eosinophils Relative: 3.6 %
HCT: 37.7 % (ref 35.0–45.0)
Hemoglobin: 12.7 g/dL (ref 11.7–15.5)
Lymphs Abs: 1476 cells/uL (ref 850–3900)
MCH: 31.8 pg (ref 27.0–33.0)
MCHC: 33.7 g/dL (ref 32.0–36.0)
MCV: 94.5 fL (ref 80.0–100.0)
MPV: 12.1 fL (ref 7.5–12.5)
Monocytes Relative: 11.7 %
Neutro Abs: 2468 cells/uL (ref 1500–7800)
Neutrophils Relative %: 52.5 %
Platelets: 207 10*3/uL (ref 140–400)
RBC: 3.99 10*6/uL (ref 3.80–5.10)
RDW: 12.4 % (ref 11.0–15.0)
Total Lymphocyte: 31.4 %
WBC: 4.7 10*3/uL (ref 3.8–10.8)

## 2018-09-06 LAB — BASIC METABOLIC PANEL WITH GFR
BUN: 22 mg/dL (ref 7–25)
CO2: 27 mmol/L (ref 20–32)
Calcium: 9.6 mg/dL (ref 8.6–10.4)
Chloride: 106 mmol/L (ref 98–110)
Creat: 0.93 mg/dL (ref 0.60–0.93)
GFR, Est African American: 68 mL/min/{1.73_m2} (ref >=60)
GFR, Est Non African American: 58 mL/min/{1.73_m2} — ABNORMAL LOW (ref >=60)
Glucose, Bld: 103 mg/dL — ABNORMAL HIGH (ref 65–99)
Potassium: 4.4 mmol/L (ref 3.5–5.3)
Sodium: 140 mmol/L (ref 135–146)

## 2018-09-06 LAB — CK: Total CK: 61 U/L (ref 29–143)

## 2018-09-06 LAB — TSH: TSH: 4.86 mIU/L — ABNORMAL HIGH (ref 0.40–4.50)

## 2018-09-06 LAB — TROPONIN I: Troponin I: <0.01 ng/mL (ref <=0.0)

## 2018-09-16 ENCOUNTER — Encounter: Payer: Self-pay | Admitting: Family Medicine

## 2018-09-17 ENCOUNTER — Other Ambulatory Visit: Payer: Self-pay | Admitting: *Deleted

## 2018-09-17 DIAGNOSIS — J432 Centrilobular emphysema: Secondary | ICD-10-CM

## 2018-09-17 DIAGNOSIS — E039 Hypothyroidism, unspecified: Secondary | ICD-10-CM

## 2018-09-17 DIAGNOSIS — E038 Other specified hypothyroidism: Secondary | ICD-10-CM

## 2018-09-17 MED ORDER — UMECLIDINIUM BROMIDE 62.5 MCG/INH IN AEPB: 1.0000 | INHALATION_SPRAY | Freq: Every day | RESPIRATORY_TRACT | 3 refills | Status: DC

## 2018-09-17 MED ORDER — LEVOTHYROXINE SODIUM 25 MCG PO TABS: 25.0000 ug | ORAL_TABLET | Freq: Every day | ORAL | 1 refills | Status: DC

## 2018-09-24 ENCOUNTER — Encounter: Payer: Self-pay | Admitting: Family Medicine

## 2018-09-29 ENCOUNTER — Other Ambulatory Visit: Payer: Self-pay | Admitting: Family Medicine

## 2018-09-29 DIAGNOSIS — E038 Other specified hypothyroidism: Secondary | ICD-10-CM

## 2018-09-29 DIAGNOSIS — E039 Hypothyroidism, unspecified: Secondary | ICD-10-CM

## 2018-09-30 ENCOUNTER — Other Ambulatory Visit: Payer: Self-pay | Admitting: Family Medicine

## 2018-09-30 DIAGNOSIS — J432 Centrilobular emphysema: Secondary | ICD-10-CM

## 2018-10-20 ENCOUNTER — Encounter: Payer: Self-pay | Admitting: Family Medicine

## 2018-10-20 DIAGNOSIS — M25551 Pain in right hip: Secondary | ICD-10-CM

## 2018-10-22 MED ORDER — DULOXETINE HCL 30 MG PO CPEP: 30.0000 mg | ORAL_CAPSULE | Freq: Every day | ORAL | 1 refills | Status: DC

## 2018-10-23 ENCOUNTER — Other Ambulatory Visit: Payer: Self-pay | Admitting: Family Medicine

## 2018-10-23 DIAGNOSIS — M19049 Primary osteoarthritis, unspecified hand: Secondary | ICD-10-CM

## 2018-10-23 DIAGNOSIS — M25551 Pain in right hip: Secondary | ICD-10-CM

## 2018-10-31 ENCOUNTER — Encounter: Payer: Self-pay | Admitting: Family Medicine

## 2018-11-02 ENCOUNTER — Other Ambulatory Visit: Payer: Self-pay | Admitting: *Deleted

## 2018-11-02 DIAGNOSIS — E039 Hypothyroidism, unspecified: Secondary | ICD-10-CM

## 2018-11-02 DIAGNOSIS — E038 Other specified hypothyroidism: Secondary | ICD-10-CM

## 2018-11-02 LAB — TSH: TSH: 3.74 mIU/L (ref 0.40–4.50)

## 2018-12-03 ENCOUNTER — Telehealth: Payer: Self-pay | Admitting: General Surgery

## 2018-12-03 NOTE — Telephone Encounter (Signed)
Called the patient back and they traveled by plane. They stayed in his sister's home, but he was the one that was going out to run errands as needed.   Message routed back to Derl Barrow, NP

## 2018-12-03 NOTE — Telephone Encounter (Signed)
Because of travel would recommend virtual visit; or we could test for covid and she can follow up in office after testing back

## 2018-12-03 NOTE — Telephone Encounter (Signed)
Did they travel by plane?

## 2018-12-03 NOTE — Telephone Encounter (Signed)
Called the patient back to advise of recommendation. Patient stated she was not "tech savvy" and preferred to make the visit a televisit. Patient said she understood why and it was not a problem.   Appointment changed to televisit. Nothing further needed at this time.

## 2018-12-04 ENCOUNTER — Other Ambulatory Visit: Payer: Self-pay

## 2018-12-04 ENCOUNTER — Encounter: Payer: Self-pay | Admitting: Primary Care

## 2018-12-04 ENCOUNTER — Ambulatory Visit: Payer: Medicare Other | Admitting: Emergency Medicine

## 2018-12-04 ENCOUNTER — Ambulatory Visit (INDEPENDENT_AMBULATORY_CARE_PROVIDER_SITE_OTHER): Payer: Medicare Other | Admitting: Primary Care

## 2018-12-04 DIAGNOSIS — J449 Chronic obstructive pulmonary disease, unspecified: Secondary | ICD-10-CM

## 2018-12-04 DIAGNOSIS — Z9989 Dependence on other enabling machines and devices: Secondary | ICD-10-CM

## 2018-12-04 DIAGNOSIS — G4733 Obstructive sleep apnea (adult) (pediatric): Secondary | ICD-10-CM

## 2018-12-04 NOTE — Progress Notes (Signed)
Virtual Visit via Telephone Note  I connected with [name] [name] on 12/04/18 at  2:00 PM EDT by telephone and verified that I am speaking with the correct person using two identifiers.  Location: Patient: Home Provider: Office   I discussed the limitations, risks, security and privacy concerns of performing an evaluation and management service by telephone and the availability of in person appointments. I also discussed with the patient that there may be a patient responsible charge related to this service. The patient expressed understanding and agreed to proceed.   History of Present Illness: 78 year old female, former smoker. PMH significant for COPD/emphysema, pulmonary nodules, allergic rhinitis, fibromyalgia, OSA on CPAP (managed by Dr. Isidoro Donning). Patient of Dr. Lamonte Sakai, last seen by pulmonary NP on 08/13/18. Treated with prednisone taper. Maintained on Claritin, Atrovent nasal spray and nasal saline rinses. September 2018 CT showed mild centrilobular emphysema.   12/04/2018 Patient contacted today for televisit for 3-4 month follow-up. Doing a lot better since starting Incruse maintenance inhaler. States that she can do more now and reports less chest pressure. Weather can effect her breathing, states that she carries her rescue inhaler with her and uses when she has to walk a long distance fast. Occasional dry cough and dry mouth. Wears CPAP every night.   Observations/Objective: - NO shortness of breath, wheezing or cough noted  Assessment and Plan:  COPD: - Mixed obstruction/restriction - Mild centrilobular emphysema on CT scan  - Improved dyspnea with LAMA inhaler  - Continue Incruse 1 puff daily; albuterol 2 puffs every 4-6 hours prn sob/wheezing  OSA: - Reports compliance with CPAP, managed by Dr. Isidoro Donning  Pulmonary nodules: - HRCT on 01/24/17 showed stability of scattered small solid pulmonary Nodules > 3 years, considered benign. No evidence of ILD.   Follow Up  Instructions:   6 month follow up with Dr. Lamonte Sakai   I discussed the assessment and treatment plan with the patient. The patient was provided an opportunity to ask questions and all were answered. The patient agreed with the plan and demonstrated an understanding of the instructions.   The patient was advised to call back or seek an in-person evaluation if the symptoms worsen or if the condition fails to improve as anticipated.  I provided 22 minutes of non-face-to-face time during this encounter.   Martyn Ehrich, NP

## 2018-12-04 NOTE — Patient Instructions (Addendum)
Continue Incruse inhaler - 1 puff once daily Use albuterol 2 puffs every 4-6 hours as needed for shortness of breath/wheezing Continue to wear CPAP every night  Follow up in 6 months with Dr. Lamonte Sakai or sooner if needed

## 2018-12-27 ENCOUNTER — Other Ambulatory Visit: Payer: Self-pay | Admitting: Family Medicine

## 2018-12-27 DIAGNOSIS — Z1231 Encounter for screening mammogram for malignant neoplasm of breast: Secondary | ICD-10-CM

## 2018-12-30 ENCOUNTER — Other Ambulatory Visit: Payer: Self-pay | Admitting: Family Medicine

## 2018-12-30 DIAGNOSIS — E038 Other specified hypothyroidism: Secondary | ICD-10-CM

## 2018-12-30 DIAGNOSIS — E039 Hypothyroidism, unspecified: Secondary | ICD-10-CM

## 2019-01-09 ENCOUNTER — Other Ambulatory Visit: Payer: Self-pay

## 2019-01-09 ENCOUNTER — Ambulatory Visit (INDEPENDENT_AMBULATORY_CARE_PROVIDER_SITE_OTHER): Payer: Medicare Other | Admitting: Family Medicine

## 2019-01-09 DIAGNOSIS — Z23 Encounter for immunization: Secondary | ICD-10-CM | POA: Diagnosis not present

## 2019-02-13 ENCOUNTER — Ambulatory Visit (INDEPENDENT_AMBULATORY_CARE_PROVIDER_SITE_OTHER): Payer: Medicare Other

## 2019-02-13 ENCOUNTER — Other Ambulatory Visit: Payer: Self-pay

## 2019-02-13 DIAGNOSIS — Z1231 Encounter for screening mammogram for malignant neoplasm of breast: Secondary | ICD-10-CM

## 2019-02-21 ENCOUNTER — Other Ambulatory Visit: Payer: Self-pay

## 2019-02-21 ENCOUNTER — Other Ambulatory Visit: Payer: Self-pay | Admitting: Family Medicine

## 2019-02-21 DIAGNOSIS — E039 Hypothyroidism, unspecified: Secondary | ICD-10-CM

## 2019-02-21 DIAGNOSIS — E038 Other specified hypothyroidism: Secondary | ICD-10-CM

## 2019-03-06 ENCOUNTER — Encounter: Payer: Self-pay | Admitting: *Deleted

## 2019-03-06 ENCOUNTER — Other Ambulatory Visit: Payer: Self-pay | Admitting: *Deleted

## 2019-03-06 DIAGNOSIS — K5790 Diverticulosis of intestine, part unspecified, without perforation or abscess without bleeding: Secondary | ICD-10-CM | POA: Insufficient documentation

## 2019-03-06 LAB — TSH: TSH: 4.7 mIU/L — ABNORMAL HIGH (ref 0.40–4.50)

## 2019-03-07 ENCOUNTER — Other Ambulatory Visit: Payer: Self-pay

## 2019-03-07 ENCOUNTER — Ambulatory Visit (INDEPENDENT_AMBULATORY_CARE_PROVIDER_SITE_OTHER): Payer: Medicare Other | Admitting: Family Medicine

## 2019-03-07 ENCOUNTER — Encounter: Payer: Self-pay | Admitting: Family Medicine

## 2019-03-07 VITALS — BP 128/51 | HR 94 | Ht 62.0 in | Wt 168.0 lb

## 2019-03-07 DIAGNOSIS — M797 Fibromyalgia: Secondary | ICD-10-CM | POA: Diagnosis not present

## 2019-03-07 DIAGNOSIS — E039 Hypothyroidism, unspecified: Secondary | ICD-10-CM

## 2019-03-07 DIAGNOSIS — E038 Other specified hypothyroidism: Secondary | ICD-10-CM

## 2019-03-07 DIAGNOSIS — G4733 Obstructive sleep apnea (adult) (pediatric): Secondary | ICD-10-CM | POA: Diagnosis not present

## 2019-03-07 DIAGNOSIS — J432 Centrilobular emphysema: Secondary | ICD-10-CM | POA: Diagnosis not present

## 2019-03-07 DIAGNOSIS — Z9989 Dependence on other enabling machines and devices: Secondary | ICD-10-CM

## 2019-03-07 DIAGNOSIS — M79601 Pain in right arm: Secondary | ICD-10-CM

## 2019-03-07 MED ORDER — LEVOTHYROXINE SODIUM 50 MCG PO TABS: 50.0000 ug | ORAL_TABLET | Freq: Every day | ORAL | 0 refills | Status: DC

## 2019-03-07 NOTE — Assessment & Plan Note (Signed)
Has been flaring recsently but feels some better today.

## 2019-03-07 NOTE — Assessment & Plan Note (Signed)
Stable.  No recent flares or exacerbations. 

## 2019-03-07 NOTE — Assessment & Plan Note (Signed)
Currently taking 25 mg 5 days a week and 50 mg 2 days a week.  We will increase her to 50 mg daily.  Patient prefers this to be sent to mail order.  When she has been on this dose for 6 to 8 weeks we will recheck her TSH.

## 2019-03-07 NOTE — Assessment & Plan Note (Signed)
Right arm pain with paresthesias in the hand-suspect that this could be cervical disc related.  Especially since it does seem to come and go we discussed that sometimes when there are some inflammation around the disc it will actually impinge the nerve which will cause pain.  We discussed possible formal physical therapy.  She declined at this time since she is feeling a little bit better.  She also reports that she does not feel like PT has been very helpful for her in the past.  We also discussed that is probably exacerbated by her neck position particularly when she is still waiting in her neck is extended forward for longer periods of time.  We also discussed heat and anti-inflammatory as needed.  If the numbness or tingling becomes persistent then we may need to do further work-up and imaging.

## 2019-03-07 NOTE — Progress Notes (Signed)
She had her lab for TSH done yesterday. These have been resulted no comments made by pcp.[name] [name], [name] [name], CMA

## 2019-03-07 NOTE — Progress Notes (Signed)
Established Patient Office Visit  Subjective:  Patient ID: [name] [name], female    DOB: 01/08/1941  Age: 78 y.o. MRN: [id number]  CC:  Chief Complaint  Patient presents with  . Hypothyroidism    she had labs done yesterday  . COPD    doing well on incruse    HPI [name] [name] presents for   Hypothyroidism - Taking medication regularly in the AM away from food and vitamins, etc. No recent change to skin, hair. She has felt more tired than usual.  Currently on 50 mcg 2 days a week and 25 mcg the other 5 days a week.  She does feel like her fibromyalgia has been flaring more recently.  Though today she actually feels a little bit better.  She is also been experiencing some intermittent shooting pains down her right arm.  She says it even causes her fingers in the right hand to tingle and go numb at times and sometimes the pain will shoot up into her neck.  This has been coming and going as well and actually feels a little bit better today.  She says her  Neck hurts frequently  She says she is really missing [facility] she feels like she has been gradually getting some weakness and decrease in balance and stability.  And she is just felt more tired.  Follow-up obstructive sleep apnea she reports that she does wear her CPAP very regularly and follows up with her sleep specialist to make sure that it does not need to be adjusted.  She does not go through her DME supplier currently she just buys her supplies out right.  Follow-up COPD-no recent flares or exacerbations she is actually doing quite well.  Still just uses her albuterol as needed.  She uses her Incruse daily.   Past Medical History:  Diagnosis Date  . Anxiety    intermittent  . blood in stool   . COPD (chronic obstructive pulmonary disease) (HCC)   . Diverticulosis   . Fibromyalgia   . GERD (gastroesophageal reflux disease)   . Glaucoma   . Hemorrhoids   . HSV-2 (herpes simplex virus 2)  infection 2009  . OSA on CPAP 07/27/06  . Osteoporosis   . Rectal bleeding    with hemorrhoids    Past Surgical History:  Procedure Laterality Date  . BUNIONECTOMY  [id number],2002  . CARPAL TUNNEL RELEASE Right 12/08/2016   Procedure: CARPAL TUNNEL RELEASE;  Surgeon: Cindee SaltKuzma, Gary, MD;  Location: [facility];  Service: Orthopedics;  Laterality: Right;  REG/FAB  . CERVICAL FUSION  2008  . hemorrhoids removed    . TRIGGER FINGER RELEASE  2008    Family History  Problem Relation Age of Onset  . Heart attack Father   . Hyperlipidemia Mother   . Hypertension Mother     Social History   Socioeconomic History  . Marital status: Married    Spouse name: Jake SharkHarold  . Number of children: 3  . Years of education: 6418  . Highest education level: Bachelor's degree (e.g., BA, AB, BS)  Occupational History  . 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 Financial resource strain: Not hard at all  . Food insecurity    Worry: Never true    Inability: Never true  . Transportation needs    Medical: No    Non-medical: No  Tobacco Use  . Smoking status: Former Smoker  Packs/day: 2.00    Years: 25.00    Pack years: 50.00    Quit date: 03/07/1981    Years since quitting: 38.0  . Smokeless tobacco: Never Used  Substance and Sexual Activity  . Alcohol use: Yes    Alcohol/week: 0.0 standard drinks    Comment: 2 drinks per month  . Drug use: No  . Sexual activity: Never  Lifestyle  . Physical activity    Days per week: 0 days    Minutes per session: 0 min  . Stress: Not at all  Relationships  . Social Herbalist on phone: Once a week    Gets together: Once a week    Attends religious service: More than 4 times per year    Active member of club or organization: No    Attends meetings of clubs or organizations: Never    Relationship status: Married  . Intimate partner violence    Fear of current or ex partner: No    Emotionally abused: No     Physically abused: No    Forced sexual activity: No  Other Topics Concern  . Not on file  Social History Narrative   : former smoker, tobacco history last updated 10/28/2013. No smoking. No tobacco exposure, no alcohol. No caffeine. No recreational drug use. Wants to get back into the silver sneakers program this coming year.    Outpatient Medications Prior to Visit  Medication Sig Dispense Refill  . aspirin 81 MG tablet Take 81 mg by mouth daily.    Marland Kitchen atorvastatin (LIPITOR) 10 MG tablet Take 1 tablet (10 mg total) by mouth daily. 90 tablet 3  . bifidobacterium infantis (ALIGN) capsule Take 1 capsule by mouth daily.    . Calcium-Phosphorus-Vitamin D (CALCIUM/D3 ADULT GUMMIES PO) Take 2 tablets daily by mouth. 2 gummies daily with meal    . Cholecalciferol (VITAMIN D PO) Take 3,000 mg by mouth daily.     . clobetasol cream (TEMOVATE) 7.11 % Apply 1 application topically 2 (two) times daily. As needed for skin irritation 60 g 4  . DENTAGEL 1.1 % GEL dental gel Apply 1.1 application topically daily.    . DULoxetine (CYMBALTA) 30 MG capsule TAKE 1 CAPSULE BY MOUTH EVERY DAY 90 capsule 1  . hydrocortisone (ANUSOL-HC) 25 MG suppository Place 1 suppository (25 mg total) rectally 3 (three) times daily as needed for hemorrhoids or itching. (Patient taking differently: Place 25 mg rectally as needed for hemorrhoids. ) 24 suppository 11  . INCRUSE ELLIPTA 62.5 MCG/INH AEPB TAKE 1 PUFF BY MOUTH EVERY DAY 30 each 0  . ipratropium (ATROVENT) 0.06 % nasal spray Place 2 sprays into both nostrils every 8 (eight) hours as needed for rhinitis. 15 mL 6  . loratadine (CLARITIN) 10 MG tablet Take 10 mg by mouth daily.    . Menatetrenone (VITAMIN K2) 100 MCG TABS Take 1 tablet by mouth daily.    . Multiple Vitamin (MULTIVITAMINS PO) Take by mouth.    . Netarsudil-Latanoprost (ROCKLATAN) 0.02-0.005 % SOLN Place 1 drop into both eyes at bedtime.    . polyethylene glycol (MIRALAX / GLYCOLAX) packet Take 17 g by mouth  as needed.     Marland Kitchen PROAIR HFA 108 (90 Base) MCG/ACT inhaler INHALE 2 PUFFS INTO THE LUNGS EVERY 4 (FOUR) HOURS AS NEEDED FOR WHEEZING OR SHORTNESS OF BREATH. 8.5 Inhaler 2  . levothyroxine (SYNTHROID) 25 MCG tablet Take 1 tablet (25 mcg total) by mouth daily before breakfast. Must have appt  with PCP for refills 30 tablet 0   No facility-administered medications prior to visit.     Allergies  Allergen Reactions  . Alphagan P [Brimonidine Tartrate]     rash  . Codeine Rash  . Penicillins Rash  . Sulfur Rash    ROS Review of Systems    Objective:    Physical Exam  BP (!) 128/51   Pulse 94   Ht  (1.575 m)   Wt 168 lb (76.2 kg)   SpO2 97%   BMI 30.73 kg/m  Wt Readings from Last 3 Encounters:  03/07/19 168 lb (76.2 kg)  09/05/18 163 lb (73.9 kg)  09/04/18 165 lb (74.8 kg)     There are no preventive care reminders to display for this patient.  There are no preventive care reminders to display for this patient.  Lab Results  Component Value Date   TSH 4.70 (H) 03/06/2019   Lab Results  Component Value Date   WBC 4.7 09/05/2018   HGB 12.7 09/05/2018   HCT 37.7 09/05/2018   MCV 94.5 09/05/2018   PLT 207 09/05/2018   Lab Results  Component Value Date   NA 140 09/05/2018   K 4.4 09/05/2018   CO2 27 09/05/2018   GLUCOSE 103 (H) 09/05/2018   BUN 22 09/05/2018   CREATININE 0.93 09/05/2018   BILITOT 1.1 06/05/2018   ALKPHOS 77 04/25/2016   AST 19 06/05/2018   ALT 15 06/05/2018   PROT 7.4 06/05/2018   ALBUMIN 4.0 04/25/2016   CALCIUM 9.6 09/05/2018   Lab Results  Component Value Date   CHOL 157 06/05/2018   Lab Results  Component Value Date   HDL 56 06/05/2018   Lab Results  Component Value Date   LDLCALC 79 06/05/2018   Lab Results  Component Value Date   TRIG 121 06/05/2018   Lab Results  Component Value Date   CHOLHDL 2.8 06/05/2018   Lab Results  Component Value Date   HGBA1C 5.6 04/26/2017      Assessment & Plan:   Problem List  Items Addressed This Visit      Respiratory   Obstructive sleep apnea on CPAP (Chronic)    Follows with sleep specialist.  She reports that she is wearing it consistently.      Centrilobular emphysema (HCC) - Primary    Stable. No recent flares or exacerbations.         Endocrine   Hypothyroid    Currently taking 25 mg 5 days a week and 50 mg 2 days a week.  We will increase her to 50 mg daily.  Patient prefers this to be sent to mail order.  When she has been on this dose for 6 to 8 weeks we will recheck her TSH.      Relevant Medications   levothyroxine (SYNTHROID) 50 MCG tablet     Other   Right arm pain    Right arm pain with paresthesias in the hand-suspect that this could be cervical disc related.  Especially since it does seem to come and go we discussed that sometimes when there are some inflammation around the disc it will actually impinge the nerve which will cause pain.  We discussed possible formal physical therapy.  She declined at this time since she is feeling a little bit better.  She also reports that she does not feel like PT has been very helpful for her in the past.  We also discussed that is probably exacerbated by  her neck position particularly when she is still waiting in her neck is extended forward for longer periods of time.  We also discussed heat and anti-inflammatory as needed.  If the numbness or tingling becomes persistent then we may need to do further work-up and imaging.      Fibromyalgia (Chronic)    Has been flaring recsently but feels some better today.        Other Visit Diagnoses    Subclinical hypothyroidism       Relevant Medications   levothyroxine (SYNTHROID) 50 MCG tablet      Meds ordered this encounter  Medications  . levothyroxine (SYNTHROID) 50 MCG tablet    Sig: Take 1 tablet (50 mcg total) by mouth daily before breakfast.    Dispense:  90 tablet    Refill:  0    Follow-up: No follow-ups on file.    Nani Gasser,  MD

## 2019-03-07 NOTE — Assessment & Plan Note (Signed)
Follows with sleep specialist.  She reports that she is wearing it consistently.

## 2019-03-11 ENCOUNTER — Other Ambulatory Visit: Payer: Self-pay | Admitting: Pulmonary Disease

## 2019-03-11 ENCOUNTER — Other Ambulatory Visit: Payer: Self-pay | Admitting: Family Medicine

## 2019-03-11 DIAGNOSIS — E038 Other specified hypothyroidism: Secondary | ICD-10-CM

## 2019-03-11 DIAGNOSIS — J309 Allergic rhinitis, unspecified: Secondary | ICD-10-CM

## 2019-03-11 DIAGNOSIS — E039 Hypothyroidism, unspecified: Secondary | ICD-10-CM

## 2019-03-18 ENCOUNTER — Other Ambulatory Visit: Payer: Self-pay | Admitting: Family Medicine

## 2019-03-18 DIAGNOSIS — E038 Other specified hypothyroidism: Secondary | ICD-10-CM

## 2019-03-18 DIAGNOSIS — E039 Hypothyroidism, unspecified: Secondary | ICD-10-CM

## 2019-04-15 ENCOUNTER — Telehealth: Payer: Self-pay | Admitting: Emergency Medicine

## 2019-04-15 MED ORDER — ALBUTEROL SULFATE HFA 108 (90 BASE) MCG/ACT IN AERS: INHALATION_SPRAY | RESPIRATORY_TRACT | 2 refills | Status: DC

## 2019-04-15 NOTE — Telephone Encounter (Signed)
Spoke with pt and advised rx sent to pharmacy. Nothing further is needed.   She wanted just one Pro air inhaler [facility] for now. She will call back if she wants a 90 day supply.

## 2019-04-26 ENCOUNTER — Other Ambulatory Visit: Payer: Self-pay | Admitting: Family Medicine

## 2019-04-26 DIAGNOSIS — M25551 Pain in right hip: Secondary | ICD-10-CM

## 2019-04-27 ENCOUNTER — Other Ambulatory Visit: Payer: Self-pay | Admitting: Family Medicine

## 2019-04-27 DIAGNOSIS — E039 Hypothyroidism, unspecified: Secondary | ICD-10-CM

## 2019-04-27 DIAGNOSIS — E038 Other specified hypothyroidism: Secondary | ICD-10-CM

## 2019-04-29 NOTE — Progress Notes (Signed)
Subjective:   [name] [name] is a 78 y.o. female who presents for Medicare Annual (Subsequent) preventive examination.  Review of Systems:  No ROS.  Medicare Wellness Virtual Visit.  Visual/audio telehealth visit, UTA vital signs.   See social history for additional risk factors.    Cardiac Risk Factors include: advanced age (>41men, >45 women);sedentary lifestyle;family history of premature cardiovascular disease Sleep patterns: Getting 6-9 hours of sleep a night. Does not wake up to void during thee night. Wakes up and feels rested does wear CPAP machine at night.   Home Safety/Smoke Alarms: Feels safe in home. Smoke alarms in place.  Living environment; Lives with husband 1 story home no stairs in or around the home. Shower is a walk in shower and grab bars in place. Seat Belt Safety/Bike Helmet: Wears seat belt.   Female:   Pap- Aged out      Mammo-  Aged out     Dexa scan- UTD       CCS- Aged out     Objective:     Vitals: There were no vitals taken for this visit.  There is no height or weight on file to calculate BMI.  Advanced Directives 05/06/2019 04/30/2018 12/08/2016 12/01/2016 08/05/2015  Does Patient Have a Medical Advance Directive? Yes Yes Yes Yes Yes  Type of Estate agent of [place];Living will Healthcare Power of [place];Living will Living will - -  Does patient want to make changes to medical advance directive? No - Patient declined No - Patient declined No - Patient declined - -  Copy of Healthcare Power of Attorney in Chart? No - copy requested No - copy requested No - copy requested No - copy requested (No Data)    Tobacco Social History   Tobacco Use  Smoking Status Former Smoker  . Packs/day: 2.00  . Years: 25.00  . Pack years: 50.00  . Quit date: 03/07/1981  . Years since quitting: 38.1  Smokeless Tobacco Never Used     Counseling given: Not Answered   Clinical Intake:  Pre-visit preparation completed:  Yes  Pain : 0-10 Pain Score: 5  Pain Type: Chronic pain Pain Location: Neck Pain Orientation: Posterior Pain Radiating Towards: right and left arm with right being worse Pain Descriptors / Indicators: Aching, Throbbing Pain Onset: More than a month ago Pain Frequency: Constant Pain Relieving Factors: rest  Pain Relieving Factors: rest  Nutritional Risks: None Diabetes: No  How often do you need to have someone help you when you read instructions, pamphlets, or other written materials from your doctor or pharmacy?: 1 - Never What is the last grade level you completed in school?: 18  Interpreter Needed?: No  Information entered by :: Carolin Sicks, LPN  Past Medical History:  Diagnosis Date  . Anxiety    intermittent  . blood in stool   . COPD (chronic obstructive pulmonary disease) (HCC)   . Diverticulosis   . Fibromyalgia   . GERD (gastroesophageal reflux disease)   . Glaucoma   . Hemorrhoids   . HSV-2 (herpes simplex virus 2) infection 2009  . OSA on CPAP 07/27/06  . Osteoporosis   . Rectal bleeding    with hemorrhoids   Past Surgical History:  Procedure Laterality Date  . BUNIONECTOMY  [id number]  . CARPAL TUNNEL RELEASE Right 12/08/2016   Procedure: CARPAL TUNNEL RELEASE;  Surgeon: Cindee Salt, MD;  Location: [facility];  Service: Orthopedics;  Laterality: Right;  REG/FAB  . CERVICAL FUSION  2008  . hemorrhoids removed    . TRIGGER FINGER RELEASE  2008   Family History  Problem Relation Age of Onset  . Heart attack Father   . Hyperlipidemia Mother   . Hypertension Mother    Social History   Socioeconomic History  . Marital status: Married    Spouse name: Joneen Boers  . Number of children: 3  . Years of education: 60  . Highest education level: Bachelor's degree (e.g., BA, AB, BS)  Occupational History  . Occupation: Copywriter, advertising    Comment: retired  Tobacco Use  . Smoking status: Former Smoker    Packs/day: 2.00    Years: 25.00    Pack  years: 50.00    Quit date: 03/07/1981    Years since quitting: 38.1  . Smokeless tobacco: Never Used  Substance and Sexual Activity  . Alcohol use: Yes    Alcohol/week: 0.0 standard drinks    Comment: 2 drinks per month socially  . Drug use: No  . Sexual activity: Not Currently  Other Topics Concern  . Not on file  Social History Narrative   : former smoker, tobacco history last updated 10/28/2013. No smoking. No tobacco exposure, no alcohol. No caffeine. No recreational drug use. Trying to stay away from Gyms due to [place]. Is going look into silver sneakers program online.   Social Determinants of Health   Financial Resource Strain:   . Difficulty of Paying Living Expenses: Not on file  Food Insecurity:   . Worried About Charity fundraiser in the Last Year: Not on file  . Ran Out of Food in the Last Year: Not on file  Transportation Needs:   . Lack of Transportation (Medical): Not on file  . Lack of Transportation (Non-Medical): Not on file  Physical Activity:   . Days of Exercise per Week: Not on file  . Minutes of Exercise per Session: Not on file  Stress:   . Feeling of Stress : Not on file  Social Connections:   . Frequency of Communication with Friends and Family: Not on file  . Frequency of Social Gatherings with Friends and Family: Not on file  . Attends Religious Services: Not on file  . Active Member of Clubs or Organizations: Not on file  . Attends Archivist Meetings: Not on file  . Marital Status: Not on file    Outpatient Encounter Medications as of 05/06/2019  Medication Sig  . albuterol (PROAIR HFA) 108 (90 Base) MCG/ACT inhaler INHALE 2 PUFFS INTO THE LUNGS EVERY 4 (FOUR) HOURS AS NEEDED FOR WHEEZING OR SHORTNESS OF BREATH.  Marland Kitchen aspirin 81 MG tablet Take 81 mg by mouth daily.  Marland Kitchen atorvastatin (LIPITOR) 10 MG tablet Take 1 tablet (10 mg total) by mouth daily.  . bifidobacterium infantis (ALIGN) capsule Take 1 capsule by mouth daily.  .  Calcium-Phosphorus-Vitamin D (CALCIUM/D3 ADULT GUMMIES PO) Take 2 tablets daily by mouth. 2 gummies daily with meal  . Cholecalciferol (VITAMIN D PO) Take 3,000 mg by mouth daily.   . clobetasol cream (TEMOVATE) 7.25 % Apply 1 application topically 2 (two) times daily. As needed for skin irritation  . DENTAGEL 1.1 % GEL dental gel Apply 1.1 application topically daily.  . DULoxetine (CYMBALTA) 30 MG capsule TAKE 1 CAPSULE BY MOUTH EVERY DAY  . hydrocortisone (ANUSOL-HC) 25 MG suppository Place 1 suppository (25 mg total) rectally 3 (three) times daily as needed for hemorrhoids or itching. (Patient taking differently: Place 25 mg rectally as needed  for hemorrhoids. )  . INCRUSE ELLIPTA 62.5 MCG/INH AEPB TAKE 1 PUFF BY MOUTH EVERY DAY  . ipratropium (ATROVENT) 0.06 % nasal spray USE 2 SPRAYS IN BOTH  NOSTRILS EVERY 8 HOURS AS  NEEDED FOR RHINITIS  . levothyroxine (SYNTHROID) 50 MCG tablet TAKE 1 TABLET BY MOUTH  DAILY BEFORE BREAKFAST  . loratadine (CLARITIN) 10 MG tablet Take 10 mg by mouth daily.  . Menatetrenone (VITAMIN K2) 100 MCG TABS Take 1 tablet by mouth daily.  . Multiple Vitamin (MULTIVITAMINS PO) Take by mouth.  . polyethylene glycol (MIRALAX / GLYCOLAX) packet Take 17 g by mouth as needed.   Marland Kitchen. levothyroxine (SYNTHROID) 25 MCG tablet TAKE 1 TABLET (25 MCG TOTAL) BY MOUTH DAILY BEFORE BREAKFAST. MUST HAVE APPT WITH PCP FOR REFILLS  . Netarsudil-Latanoprost (ROCKLATAN) 0.02-0.005 % SOLN Place 1 drop into both eyes at bedtime.   No facility-administered encounter medications on file as of 05/06/2019.    Activities of Daily Living In your present state of health, do you have any difficulty performing the following activities: 05/06/2019  Hearing? N  Comment has tinnitus  Vision? N  Comment has glaucoma- sees specialists for this  Difficulty concentrating or making decisions? N  Walking or climbing stairs? N  Dressing or bathing? N  Doing errands, shopping? N  Preparing Food and  eating ? N  Using the Toilet? N  In the past six months, have you accidently leaked urine? N  Do you have problems with loss of bowel control? N  Managing your Medications? N  Managing your Finances? N  Housekeeping or managing your Housekeeping? N  Some recent data might be hidden    Patient Care Team: Agapito GamesMetheney, Catherine D, MD as PCP - General (Family Medicine) Delton CoombesByrum, Les Pouobert S, MD as Consulting Physician (Pulmonary Disease) Elane FritzBritt, John, MD as Consulting Physician (Otolaryngology) Deretha Emorysborne, James C, MD as Attending Physician (Sleep Medicine) Cain SaupeHageman, Patrick, MD as Referring Physician (Ophthalmology)    Assessment:   This is a routine wellness examination for [name] BuddyYvonne.Physical assessment deferred to PCP.   Exercise Activities and Dietary recommendations Current Exercise Habits: The patient does not participate in regular exercise at present, Exercise limited by: None identified Diet Eats a healthy diet of vegetables, fruits and proteins. Drinks milk daily and takes calcium suplement. Drinks 2-3 glasses of water daily. Breakfast: blueberry muffin and orange juice Lunch: skips Dinner: Meat and vegetables, milk. Drinks protein shakes daily to get enough protein in for the day.      Goals    . Exercise 3x per week (30 min per time)     Exercise at least 3 times a week for 30 minutes at at time, Start back into  It slowly with the COPD and keep rescue inhaler with you. Wants to get back into the silver sneakers program.    . Patient Stated     Would like to get back into silver sneakers program whether online or in class to help with balance and gait.       Fall Risk Fall Risk  05/06/2019 03/07/2019 04/30/2018 02/05/2018 01/19/2017  Falls in the past year? 1 1 1  Yes No  Number falls in past yr: 0 0 0 1 -  Injury with Fall? 0 1 0 No -  Risk for fall due to : Impaired balance/gait Other (Comment) Impaired balance/gait - -  Risk for fall due to: Comment - pt was running from a dog -  - -  Follow up Falls prevention discussed Falls prevention discussed  Falls prevention discussed - -   Is the patient's home free of loose throw rugs in walkways, pet beds, electrical cords, etc?   yes      Grab bars in the bathroom? yes      Handrails on the stairs?   no      Adequate lighting?   yes  Depression Screen PHQ 2/9 Scores 05/06/2019 03/07/2019 09/04/2018 04/30/2018  PHQ - 2 Score 0 0 0 0  Exception Documentation - - - Medical reason     Cognitive Function     6CIT Screen 05/06/2019 04/30/2018  What Year? 0 points 0 points  What month? 0 points 0 points  What time? 0 points 0 points  Count back from 20 0 points 0 points  Months in reverse 0 points 0 points  Repeat phrase 0 points 0 points  Total Score 0 0    Immunization History  Administered Date(s) Administered  . Fluad Quad(high Dose 65+) 01/09/2019  . Influenza, High Dose Seasonal PF 01/19/2017, 02/05/2018  . Influenza,inj,Quad PF,6+ Mos 04/08/2015, 02/04/2016  . Pneumococcal Conjugate-13 04/10/2014  . Pneumococcal Polysaccharide-23 03/17/2004  . Tdap 06/26/2007, 08/15/2017  . Zoster 03/01/2006    Screening Tests Health Maintenance  Topic Date Due  . TETANUS/TDAP  08/16/2027  . INFLUENZA VACCINE  Completed  . DEXA SCAN  Completed  . PNA vac Low Risk Adult  Completed      Plan:    Please schedule your next medicare wellness visit with me in 1 yr.  Ms. Pike , Thank you for taking time to come for your Medicare Wellness Visit. I appreciate your ongoing commitment to your health goals. Please review the following plan we discussed and let me know if I can assist you in the future.  Continue doing brain stimulating activities (puzzles, reading, adult coloring books, staying active) to keep memory sharp.    These are the goals we discussed: Goals    . Exercise 3x per week (30 min per time)     Exercise at least 3 times a week for 30 minutes at at time, Start back into  It slowly with the COPD  and keep rescue inhaler with you. Wants to get back into the silver sneakers program.    . Patient Stated     Would like to get back into silver sneakers program whether online or in class to help with balance and gait.       This is a list of the screening recommended for you and due dates:  Health Maintenance  Topic Date Due  . Tetanus Vaccine  08/16/2027  . Flu Shot  Completed  . DEXA scan (bone density measurement)  Completed  . Pneumonia vaccines  Completed      I have personally reviewed and noted the following in the patient's chart:   . Medical and social history . Use of alcohol, tobacco or illicit drugs  . Current medications and supplements . Functional ability and status . Nutritional status . Physical activity . Advanced directives . List of other physicians . Hospitalizations, surgeries, and ER visits in previous 12 months . Vitals . Screenings to include cognitive, depression, and falls . Referrals and appointments  In addition, I have reviewed and discussed with patient certain preventive protocols, quality metrics, and best practice recommendations. A written personalized care plan for preventive services as well as general preventive health recommendations were provided to patient.     Normand Sloop, LPN  81/19/1478

## 2019-05-06 ENCOUNTER — Ambulatory Visit (INDEPENDENT_AMBULATORY_CARE_PROVIDER_SITE_OTHER): Payer: Medicare Other | Admitting: *Deleted

## 2019-05-06 VITALS — Ht 62.0 in | Wt 165.0 lb

## 2019-05-06 DIAGNOSIS — Z0000 Encounter for general adult medical examination without abnormal findings: Secondary | ICD-10-CM

## 2019-05-17 ENCOUNTER — Encounter: Payer: Medicare Other | Admitting: Family Medicine

## 2019-05-20 ENCOUNTER — Other Ambulatory Visit: Payer: Self-pay | Admitting: Family Medicine

## 2019-05-20 DIAGNOSIS — Z79899 Other long term (current) drug therapy: Secondary | ICD-10-CM

## 2019-05-20 DIAGNOSIS — E782 Mixed hyperlipidemia: Secondary | ICD-10-CM

## 2019-05-20 DIAGNOSIS — E039 Hypothyroidism, unspecified: Secondary | ICD-10-CM

## 2019-05-20 DIAGNOSIS — Z0000 Encounter for general adult medical examination without abnormal findings: Secondary | ICD-10-CM

## 2019-05-20 DIAGNOSIS — I2510 Atherosclerotic heart disease of native coronary artery without angina pectoris: Secondary | ICD-10-CM

## 2019-05-20 NOTE — Progress Notes (Signed)
Patient stopped by and needed lab orders for her visit on Wednesday with PCP. Labs placed per PCP and given to patient.

## 2019-05-21 LAB — CBC WITH DIFFERENTIAL/PLATELET
Absolute Monocytes: 679 cells/uL (ref 200–950)
Basophils Absolute: 47 cells/uL (ref 0–200)
Basophils Relative: 0.8 %
Eosinophils Absolute: 112 cells/uL (ref 15–500)
Eosinophils Relative: 1.9 %
HCT: 39.1 % (ref 35.0–45.0)
Hemoglobin: 13.4 g/dL (ref 11.7–15.5)
Lymphs Abs: 2189 cells/uL (ref 850–3900)
MCH: 31.8 pg (ref 27.0–33.0)
MCHC: 34.3 g/dL (ref 32.0–36.0)
MCV: 92.7 fL (ref 80.0–100.0)
MPV: 11.6 fL (ref 7.5–12.5)
Monocytes Relative: 11.5 %
Neutro Abs: 2873 cells/uL (ref 1500–7800)
Neutrophils Relative %: 48.7 %
Platelets: 217 10*3/uL (ref 140–400)
RBC: 4.22 10*6/uL (ref 3.80–5.10)
RDW: 12.6 % (ref 11.0–15.0)
Total Lymphocyte: 37.1 %
WBC: 5.9 10*3/uL (ref 3.8–10.8)

## 2019-05-21 LAB — LIPID PANEL W/REFLEX DIRECT LDL
Cholesterol: 160 mg/dL (ref <200)
HDL: 58 mg/dL (ref >=50)
LDL Cholesterol (Calc): 76 mg/dL (calc)
Non-HDL Cholesterol (Calc): 102 mg/dL (calc) (ref <130)
Total CHOL/HDL Ratio: 2.8 (calc) (ref <5.0)
Triglycerides: 159 mg/dL — ABNORMAL HIGH (ref <150)

## 2019-05-21 LAB — COMPLETE METABOLIC PANEL WITH GFR
AG Ratio: 1.2 (calc) (ref 1.0–2.5)
ALT: 17 U/L (ref 6–29)
AST: 20 U/L (ref 10–35)
Albumin: 4.1 g/dL (ref 3.6–5.1)
Alkaline phosphatase (APISO): 94 U/L (ref 37–153)
BUN/Creatinine Ratio: 16 (calc) (ref 6–22)
BUN: 16 mg/dL (ref 7–25)
CO2: 28 mmol/L (ref 20–32)
Calcium: 9.6 mg/dL (ref 8.6–10.4)
Chloride: 104 mmol/L (ref 98–110)
Creat: 0.98 mg/dL — ABNORMAL HIGH (ref 0.60–0.88)
GFR, Est African American: 63 mL/min/{1.73_m2} (ref >=60)
GFR, Est Non African American: 54 mL/min/{1.73_m2} — ABNORMAL LOW (ref >=60)
Globulin: 3.5 g/dL (calc) (ref 1.9–3.7)
Glucose, Bld: 102 mg/dL — ABNORMAL HIGH (ref 65–99)
Potassium: 3.8 mmol/L (ref 3.5–5.3)
Sodium: 141 mmol/L (ref 135–146)
Total Bilirubin: 1.1 mg/dL (ref 0.2–1.2)
Total Protein: 7.6 g/dL (ref 6.1–8.1)

## 2019-05-21 LAB — TSH: TSH: 3.38 mIU/L (ref 0.40–4.50)

## 2019-05-22 ENCOUNTER — Ambulatory Visit (INDEPENDENT_AMBULATORY_CARE_PROVIDER_SITE_OTHER): Payer: Medicare PPO | Admitting: Family Medicine

## 2019-05-22 ENCOUNTER — Other Ambulatory Visit: Payer: Self-pay | Admitting: Family Medicine

## 2019-05-22 ENCOUNTER — Encounter: Payer: Self-pay | Admitting: Family Medicine

## 2019-05-22 ENCOUNTER — Ambulatory Visit (INDEPENDENT_AMBULATORY_CARE_PROVIDER_SITE_OTHER): Payer: Medicare PPO

## 2019-05-22 ENCOUNTER — Other Ambulatory Visit: Payer: Self-pay

## 2019-05-22 VITALS — BP 134/79 | HR 95 | Ht 62.0 in | Wt 171.0 lb

## 2019-05-22 DIAGNOSIS — Z01818 Encounter for other preprocedural examination: Secondary | ICD-10-CM

## 2019-05-22 DIAGNOSIS — M542 Cervicalgia: Secondary | ICD-10-CM | POA: Diagnosis not present

## 2019-05-22 DIAGNOSIS — E038 Other specified hypothyroidism: Secondary | ICD-10-CM

## 2019-05-22 DIAGNOSIS — E039 Hypothyroidism, unspecified: Secondary | ICD-10-CM

## 2019-05-22 DIAGNOSIS — Z0000 Encounter for general adult medical examination without abnormal findings: Secondary | ICD-10-CM | POA: Diagnosis not present

## 2019-05-22 MED ORDER — AMBULATORY NON FORMULARY MEDICATION: 0 refills | Status: DC

## 2019-05-22 MED ORDER — SPIRIVA RESPIMAT 2.5 MCG/ACT IN AERS: 2.0000 | INHALATION_SPRAY | Freq: Every day | RESPIRATORY_TRACT | 3 refills | Status: DC

## 2019-05-22 MED ORDER — ATORVASTATIN CALCIUM 10 MG PO TABS: 10.0000 mg | ORAL_TABLET | Freq: Every day | ORAL | 3 refills | Status: DC

## 2019-05-22 NOTE — Patient Instructions (Signed)

## 2019-05-22 NOTE — Progress Notes (Signed)
Subjective:     [name] [name] is a 79 y.o. female and is here for a comprehensive physical exam. The patient reports no problems.  She is overall doing well.  She admits she has not been as active since Covid hit and has not been attending Silver sneakers but recently found out that it is online and has started doing the exercises at home again and feels like it is actually been helping.  He is actually having multiple dental implants done and has some preoperative paperwork that needs to be completed to make sure that she is safe for surgery.  She has never had any previous problems with anesthesia or sedation.  She has had multiple surgeries including cervical fusion and some minor surgery such as carpal tunnel and bunionectomy.  She has been having some more problems with her neck recently.  She is already has a cervical fusion in place but over the last year she has been having more pain and problems.  It radiates down in towards her shoulders at times.  Sometimes it almost shoots and occipital type headache up and over across the top of her head.  She notices that her fingers particularly in the thumb and first couple of fingers are going numb at times in both hands.  And she says that is what was happening initially when she had the fusion done greater than 10 years ago.  Social History   Socioeconomic History  . Marital status: Married    Spouse name: [name]  . Number of children: 3  . Years of education: 76  . Highest education level: Bachelor's degree (e.g., BA, AB, BS)  Occupational History  . Occupation: Armed forces operational officer    Comment: retired  Tobacco Use  . Smoking status: Former Smoker    Packs/day: 2.00    Years: 25.00    Pack years: 50.00    Quit date: 03/07/1981    Years since quitting: 38.2  . Smokeless tobacco: Never Used  Substance and Sexual Activity  . Alcohol use: Yes    Alcohol/week: 0.0 standard drinks    Comment: 2 drinks per month socially  . Drug  use: No  . Sexual activity: Not Currently  Other Topics Concern  . Not on file  Social History Narrative   : former smoker, tobacco history last updated 10/28/2013. No smoking. No tobacco exposure, no alcohol. No caffeine. No recreational drug use. Trying to stay away from Gyms due to COVID. Is going look into silver sneakers program online.   Social Determinants of Health   Financial Resource Strain:   . Difficulty of Paying Living Expenses: Not on file  Food Insecurity:   . Worried About Programme researcher, broadcasting/film/video in the Last Year: Not on file  . Ran Out of Food in the Last Year: Not on file  Transportation Needs:   . Lack of Transportation (Medical): Not on file  . Lack of Transportation (Non-Medical): Not on file  Physical Activity:   . Days of Exercise per Week: Not on file  . Minutes of Exercise per Session: Not on file  Stress:   . Feeling of Stress : Not on file  Social Connections:   . Frequency of Communication with Friends and Family: Not on file  . Frequency of Social Gatherings with Friends and Family: Not on file  . Attends Religious Services: Not on file  . Active Member of Clubs or Organizations: Not on file  . Attends Banker Meetings: Not on  file  . Marital Status: Not on file  Intimate Partner Violence:   . Fear of Current or Ex-Partner: Not on file  . Emotionally Abused: Not on file  . Physically Abused: Not on file  . Sexually Abused: Not on file   Health Maintenance  Topic Date Due  . TETANUS/TDAP  08/16/2027  . INFLUENZA VACCINE  Completed  . DEXA SCAN  Completed  . PNA vac Low Risk Adult  Completed    The following portions of the patient's history were reviewed and updated as appropriate: allergies, current medications, past family history, past medical history, past social history, past surgical history and problem list.  Review of Systems A comprehensive review of systems was negative.   Objective:    BP 134/79   Pulse 95   Ht 5\' 2"   (1.575 m)   Wt 171 lb (77.6 kg)   SpO2 97%   BMI 31.28 kg/m  General appearance: alert, cooperative and appears stated age Head: Normocephalic, without obvious abnormality, atraumatic Eyes: conj clear, EOMI, PEERLA Ears: normal TM's and external ear canals both ears Nose: Nares normal. Septum midline. Mucosa normal. No drainage or sinus tenderness. Throat: lips, mucosa, and tongue normal; teeth and gums normal Neck: no adenopathy, no carotid bruit, no JVD, supple, symmetrical, trachea midline, thyroid not enlarged, symmetric, no tenderness/mass/nodules and Normal flexion, decreased extension.  Decreased rotation right and left but pain with rotation to the left.  Decreased sidebending bilaterally. Back: symmetric, no curvature. ROM normal. No CVA tenderness. Lungs: clear to auscultation bilaterally Heart: regular rate and rhythm, S1, S2 normal, no murmur, click, rub or gallop Abdomen: soft, non-tender; bowel sounds normal; no masses,  no organomegaly Extremities: extremities normal, atraumatic, no cyanosis or edema Pulses: 2+ and symmetric Skin: Skin color, texture, turgor normal. No rashes or lesions Lymph nodes: Cervical adenopathy: nl and Supraclavicular adenopathy: nl Neurologic: Alert and oriented X 3, normal strength and tone. Normal symmetric reflexes. Normal coordination and gait    Assessment:    Healthy female exam.      Plan:     See After Visit Summary for Counseling Recommendations    Keep up a regular exercise program and make sure you are eating a healthy diet Try to eat 4 servings of dairy a day, or if you are lactose intolerant take a calcium with vitamin D daily.  Your vaccines are up to date.  He is scheduled for a Covid vaccine. She is due for a shingles vaccine but I want her to hold off until she completes her Covid vaccine. Preoperative clearance form completed for oral surgeon for dental implants.  Please see copy in chart.  Cervical pain-recommend that  we get her in with Dr. Dianah Field for further work-up and evaluation.  She would likely benefit from some formal physical therapy for her neck.  We will go ahead and get x-rays today just to make sure that hardware looks okay. Continue to use Tylenol PRN.    She is clear for surgery for dental implants will fax over paperwork.   Keep regularly scheduled follow-up for the end of April.

## 2019-05-24 ENCOUNTER — Ambulatory Visit (INDEPENDENT_AMBULATORY_CARE_PROVIDER_SITE_OTHER): Payer: Medicare PPO | Admitting: Sports Medicine

## 2019-05-24 ENCOUNTER — Other Ambulatory Visit: Payer: Self-pay

## 2019-05-24 DIAGNOSIS — M797 Fibromyalgia: Secondary | ICD-10-CM

## 2019-05-24 DIAGNOSIS — M25551 Pain in right hip: Secondary | ICD-10-CM

## 2019-05-24 DIAGNOSIS — Q761 Klippel-Feil syndrome: Secondary | ICD-10-CM | POA: Diagnosis not present

## 2019-05-24 MED ORDER — PREDNISONE 50 MG PO TABS: ORAL_TABLET | ORAL | 0 refills | Status: DC

## 2019-05-24 MED ORDER — DULOXETINE HCL 60 MG PO CPEP: 60.0000 mg | ORAL_CAPSULE | Freq: Every day | ORAL | 3 refills | Status: DC

## 2019-05-24 NOTE — Progress Notes (Signed)
    Procedures performed today:    None.  Independent interpretation of tests performed by another provider:   I personally reviewed her cervical spine x-rays that show an unremarkable appearing C5-C7 ACDF with good fusion, she has adjacent level disease both cranial and caudal to the fusion construct.  Impression and Recommendations:    Cervical fusion syndrome [name] [name] is a very pleasant 79 year old female, she is post C5-C7 fusion with ACDF over a decade ago, she did well initially but now is having a worsening of pain in her neck, stiffness, with radiation down both upper extremities, right worse than left to all of the fingers. No progressive weakness yet. Pain is severe, she really only takes over-the-counter medications right now with the exception of Cymbalta. Adding a 5-day burst of prednisone, I am also going to bump up her Cymbalta. Adding cervical spine rehab exercises, return to see me in 1 month, if no better we will proceed with an MRI and discuss interventional planning.  Fibromyalgia [name] [name] also has significant myofascial trigger points, she is on Cymbalta 30 mg, we are going to increase this to 60 mg daily for now.     ___________________________________________ [name]. Benjamin Stain, M.D., ABFM., CAQSM. Primary Care and Sports Medicine [facility]  Adjunct Instructor of Family Medicine  [employer]

## 2019-05-24 NOTE — Assessment & Plan Note (Signed)
[name] [name] is a very pleasant 79 year old female, she is post C5-C7 fusion with ACDF over a decade ago, she did well initially but now is having a worsening of pain in her neck, stiffness, with radiation down both upper extremities, right worse than left to all of the fingers. No progressive weakness yet. Pain is severe, she really only takes over-the-counter medications right now with the exception of Cymbalta. Adding a 5-day burst of prednisone, I am also going to bump up her Cymbalta. Adding cervical spine rehab exercises, return to see me in 1 month, if no better we will proceed with an MRI and discuss interventional planning.

## 2019-05-24 NOTE — Assessment & Plan Note (Signed)
[name] [name] also has significant myofascial trigger points, she is on Cymbalta 30 mg, we are going to increase this to 60 mg daily for now.

## 2019-05-31 ENCOUNTER — Telehealth: Payer: Self-pay

## 2019-05-31 NOTE — Telephone Encounter (Signed)
Okay, do we still have a copy of the form so that we know what they need?

## 2019-05-31 NOTE — Telephone Encounter (Signed)
Donald with [facility] called and states the form was sent over. However there was no attached information as mentioned on the surgical clearance form.

## 2019-06-02 ENCOUNTER — Ambulatory Visit: Payer: Medicare PPO | Attending: Internal Medicine

## 2019-06-02 DIAGNOSIS — Z23 Encounter for immunization: Secondary | ICD-10-CM

## 2019-06-02 NOTE — Progress Notes (Signed)
   Covid-19 Vaccination Clinic  Name:  [name] [name]    MRN: [id number] DOB: [birth year]  06/02/2019  [name] was observed post Covid-19 immunization for 15 minutes without incidence. She was provided with Vaccine Information Sheet and instruction to access the V-Safe system.   [name] was instructed to call 911 with any severe reactions post vaccine: Marland Kitchen Difficulty breathing  . Swelling of your face and throat  . A fast heartbeat  . A bad rash all over your body  . Dizziness and weakness    Immunizations Administered    Name Date Dose VIS Date Route   Pfizer COVID-19 Vaccine 06/02/2019 12:33 PM 0.3 mL 04/19/2019 Intramuscular   Manufacturer: [employer], [employer]   Lot: [id number]   NDC: [id number]

## 2019-06-03 NOTE — Telephone Encounter (Signed)
Form faxed to [facility].Heath Gold, CMA

## 2019-06-15 ENCOUNTER — Other Ambulatory Visit: Payer: Self-pay | Admitting: Sports Medicine

## 2019-06-15 DIAGNOSIS — M797 Fibromyalgia: Secondary | ICD-10-CM

## 2019-06-15 DIAGNOSIS — M25551 Pain in right hip: Secondary | ICD-10-CM

## 2019-06-21 ENCOUNTER — Other Ambulatory Visit: Payer: Self-pay

## 2019-06-21 ENCOUNTER — Ambulatory Visit (INDEPENDENT_AMBULATORY_CARE_PROVIDER_SITE_OTHER): Payer: Medicare PPO | Admitting: Sports Medicine

## 2019-06-21 ENCOUNTER — Encounter: Payer: Self-pay | Admitting: Sports Medicine

## 2019-06-21 DIAGNOSIS — Q761 Klippel-Feil syndrome: Secondary | ICD-10-CM | POA: Diagnosis not present

## 2019-06-21 NOTE — Assessment & Plan Note (Signed)
[name] [name] returns, she is a pleasant 79 year old female, she is post C5-C7 fusion with ACDF over a decade ago, initially well but with worsening of pain, stiffness with radiation down both upper extremities right worse than left to all of her fingers, we treated her aggressively with prednisone, I increased her Cymbalta to 60 mg and she returns today mostly pain-free. She has had some extensive dental work and this has interfered with her doing her home rehabilitation, but she prefers to get more diligent with her rehab before considering MRI and intervention. At this point I think she can return to see me on an as-needed basis.

## 2019-06-21 NOTE — Progress Notes (Signed)
    Procedures performed today:    None.  Independent interpretation of tests performed by another provider:   None.  Impression and Recommendations:    Cervical fusion syndrome Ermalinda returns, she is a pleasant 79 year old female, she is post C5-C7 fusion with ACDF over a decade ago, initially well but with worsening of pain, stiffness with radiation down both upper extremities right worse than left to all of her fingers, we treated her aggressively with prednisone, I increased her Cymbalta to 60 mg and she returns today mostly pain-free. She has had some extensive dental work and this has interfered with her doing her home rehabilitation, but she prefers to get more diligent with her rehab before considering MRI and intervention. At this point I think she can return to see me on an as-needed basis.    ___________________________________________ [name]. Benjamin Stain, M.D., ABFM., CAQSM. Primary Care and Sports Medicine [facility]  Adjunct Instructor of Family Medicine  [employer]

## 2019-06-24 ENCOUNTER — Ambulatory Visit: Payer: Medicare PPO | Attending: Internal Medicine

## 2019-06-24 DIAGNOSIS — Z23 Encounter for immunization: Secondary | ICD-10-CM

## 2019-06-24 NOTE — Progress Notes (Signed)
   Covid-19 Vaccination Clinic  Name:  [name] [name]    MRN: [id number] DOB: [birth year]  06/24/2019  [name] was observed post Covid-19 immunization for 15 minutes without incidence. She was provided with Vaccine Information Sheet and instruction to access the V-Safe system.   [name] was instructed to call 911 with any severe reactions post vaccine: Marland Kitchen Difficulty breathing  . Swelling of your face and throat  . A fast heartbeat  . A bad rash all over your body  . Dizziness and weakness    Immunizations Administered    Name Date Dose VIS Date Route   Pfizer COVID-19 Vaccine 06/24/2019  1:16 PM 0.3 mL 04/19/2019 Intramuscular   Manufacturer: [employer], [employer]   Lot: [id number]   NDC: [id number]

## 2019-07-12 DIAGNOSIS — Z808 Family history of malignant neoplasm of other organs or systems: Secondary | ICD-10-CM | POA: Diagnosis not present

## 2019-07-12 DIAGNOSIS — L821 Other seborrheic keratosis: Secondary | ICD-10-CM | POA: Diagnosis not present

## 2019-07-12 DIAGNOSIS — D2261 Melanocytic nevi of right upper limb, including shoulder: Secondary | ICD-10-CM | POA: Diagnosis not present

## 2019-07-12 DIAGNOSIS — L720 Epidermal cyst: Secondary | ICD-10-CM | POA: Diagnosis not present

## 2019-07-12 DIAGNOSIS — L918 Other hypertrophic disorders of the skin: Secondary | ICD-10-CM | POA: Diagnosis not present

## 2019-07-12 DIAGNOSIS — D225 Melanocytic nevi of trunk: Secondary | ICD-10-CM | POA: Diagnosis not present

## 2019-07-19 ENCOUNTER — Encounter: Payer: Self-pay | Admitting: Family Medicine

## 2019-07-19 DIAGNOSIS — M25551 Pain in right hip: Secondary | ICD-10-CM

## 2019-07-19 DIAGNOSIS — E039 Hypothyroidism, unspecified: Secondary | ICD-10-CM

## 2019-07-19 DIAGNOSIS — E038 Other specified hypothyroidism: Secondary | ICD-10-CM

## 2019-07-19 DIAGNOSIS — M797 Fibromyalgia: Secondary | ICD-10-CM

## 2019-07-20 ENCOUNTER — Encounter: Payer: Self-pay | Admitting: Family Medicine

## 2019-07-22 ENCOUNTER — Other Ambulatory Visit: Payer: Self-pay | Admitting: *Deleted

## 2019-07-22 DIAGNOSIS — J309 Allergic rhinitis, unspecified: Secondary | ICD-10-CM

## 2019-07-22 MED ORDER — DULOXETINE HCL 60 MG PO CPEP: 60.0000 mg | ORAL_CAPSULE | Freq: Every day | ORAL | 1 refills | Status: DC

## 2019-07-22 MED ORDER — LEVOTHYROXINE SODIUM 50 MCG PO TABS: 50.0000 ug | ORAL_TABLET | Freq: Every day | ORAL | 2 refills | Status: DC

## 2019-07-26 ENCOUNTER — Encounter: Payer: Self-pay | Admitting: Family Medicine

## 2019-07-26 DIAGNOSIS — H401111 Primary open-angle glaucoma, right eye, mild stage: Secondary | ICD-10-CM | POA: Diagnosis not present

## 2019-07-26 DIAGNOSIS — H401122 Primary open-angle glaucoma, left eye, moderate stage: Secondary | ICD-10-CM | POA: Diagnosis not present

## 2019-09-03 ENCOUNTER — Telehealth: Payer: Self-pay

## 2019-09-03 NOTE — Telephone Encounter (Signed)
[name] [name] wanted to know if she need to have a TSH check before her appointment on Thursday. I advised her that the last TSH was within normal limits. I also advised if Dr Linford Arnold wanted to check the TSH again this all can be done in the same day.

## 2019-09-05 ENCOUNTER — Ambulatory Visit (INDEPENDENT_AMBULATORY_CARE_PROVIDER_SITE_OTHER): Payer: Medicare PPO | Admitting: Family Medicine

## 2019-09-05 ENCOUNTER — Encounter: Payer: Self-pay | Admitting: Family Medicine

## 2019-09-05 VITALS — BP 133/64 | HR 86 | Ht 62.0 in | Wt 165.0 lb

## 2019-09-05 DIAGNOSIS — M797 Fibromyalgia: Secondary | ICD-10-CM

## 2019-09-05 DIAGNOSIS — E039 Hypothyroidism, unspecified: Secondary | ICD-10-CM

## 2019-09-05 DIAGNOSIS — R0789 Other chest pain: Secondary | ICD-10-CM

## 2019-09-05 DIAGNOSIS — L659 Nonscarring hair loss, unspecified: Secondary | ICD-10-CM

## 2019-09-05 DIAGNOSIS — N1831 Chronic kidney disease, stage 3a: Secondary | ICD-10-CM

## 2019-09-05 NOTE — Progress Notes (Signed)
Established Patient Office Visit  Subjective:  Patient ID: [name] [name], female    DOB: 1940-05-31  Age: 79 y.o. MRN: [id number]  CC:  Chief Complaint  Patient presents with  . Hypothyroidism    HPI TIDA SANER presents for   Follow-up fibromyalgia -overall she is doing fair.  She denies any recent changes or concerns in regards to her fibromyalgia.  She still currently on Cymbalta.  Hypothyroidism - Taking medication regularly in the AM away from food and vitamins, etc. No recent change to skin, or energy levels.  She has noticed a little bit more hair loss at the peaks of her hairline and just on the top of her head even her hairdresser has commented.  Its not been coming out in clumps or spots just some general thinning and wants to know what might be helpful to improve that.  She also notes that she has had a couple more episodes of pain that starts in her right jaw and then radiates to her right ear followed by midsternal chest pain that she says is quite intense when it happens.  She says the pain in the chest last for about 10 or 15 minutes and then eases off.  Both times seem to have happened in the evening when she was getting ready for bed.  She does have notation of 1 vessel coronary atherosclerosis on CT of the chest performed September 2018.    Past Medical History:  Diagnosis Date  . Anxiety    intermittent  . blood in stool   . COPD (chronic obstructive pulmonary disease) (HCC)   . Diverticulosis   . Fibromyalgia   . GERD (gastroesophageal reflux disease)   . Glaucoma   . Hemorrhoids   . HSV-2 (herpes simplex virus 2) infection 2009  . OSA on CPAP 07/27/06  . Osteoporosis   . Rectal bleeding    with hemorrhoids    Past Surgical History:  Procedure Laterality Date  . BUNIONECTOMY  [id number]  . CARPAL TUNNEL RELEASE Right 12/08/2016   Procedure: CARPAL TUNNEL RELEASE;  Surgeon: Cindee Salt, MD;  Location: [facility];  Service:  Orthopedics;  Laterality: Right;  REG/FAB  . CERVICAL FUSION  2008  . hemorrhoids removed    . TRIGGER FINGER RELEASE  2008    Family History  Problem Relation Age of Onset  . Heart attack Father   . Hyperlipidemia Mother   . Hypertension Mother     Social History   Socioeconomic History  . Marital status: Married    Spouse name: [name]  . Number of children: 3  . Years of education: 44  . Highest education level: Bachelor's degree (e.g., BA, AB, BS)  Occupational History  . Occupation: Armed forces operational officer    Comment: retired  Tobacco Use  . Smoking status: Former Smoker    Packs/day: 2.00    Years: 25.00    Pack years: 50.00    Quit date: 03/07/1981    Years since quitting: 38.5  . Smokeless tobacco: Never Used  Substance and Sexual Activity  . Alcohol use: Yes    Alcohol/week: 0.0 standard drinks    Comment: 2 drinks per month socially  . Drug use: No  . Sexual activity: Not Currently  Other Topics Concern  . Not on file  Social History Narrative   : former smoker, tobacco history last updated 10/28/2013. No smoking. No tobacco exposure, no alcohol. No caffeine. No recreational drug use. Trying to stay away from  Gyms due to COVID. Is going look into silver sneakers program online.   Social Determinants of Health   Financial Resource Strain:   . Difficulty of Paying Living Expenses:   Food Insecurity:   . Worried About Programme researcher, broadcasting/film/video in the Last Year:   . Barista in the Last Year:   Transportation Needs:   . Freight forwarder (Medical):   Marland Kitchen Lack of Transportation (Non-Medical):   Physical Activity:   . Days of Exercise per Week:   . Minutes of Exercise per Session:   Stress:   . Feeling of Stress :   Social Connections:   . Frequency of Communication with Friends and Family:   . Frequency of Social Gatherings with Friends and Family:   . Attends Religious Services:   . Active Member of Clubs or Organizations:   . Attends Tax inspector Meetings:   Marland Kitchen Marital Status:   Intimate Partner Violence:   . Fear of Current or Ex-Partner:   . Emotionally Abused:   Marland Kitchen Physically Abused:   . Sexually Abused:     Outpatient Medications Prior to Visit  Medication Sig Dispense Refill  . albuterol (PROAIR HFA) 108 (90 Base) MCG/ACT inhaler INHALE 2 PUFFS INTO THE LUNGS EVERY 4 (FOUR) HOURS AS NEEDED FOR WHEEZING OR SHORTNESS OF BREATH. 18 g 2  . aspirin 81 MG tablet Take 81 mg by mouth daily.    Marland Kitchen atorvastatin (LIPITOR) 10 MG tablet Take 1 tablet (10 mg total) by mouth daily. 90 tablet 3  . bifidobacterium infantis (ALIGN) capsule Take 1 capsule by mouth daily.    . Calcium-Phosphorus-Vitamin D (CALCIUM/D3 ADULT GUMMIES PO) Take 2 tablets daily by mouth. 2 gummies daily with meal    . Cholecalciferol (VITAMIN D PO) Take 3,000 mg by mouth daily.     . DENTAGEL 1.1 % GEL dental gel Apply 1.1 application topically daily.    . DULoxetine (CYMBALTA) 60 MG capsule Take 1 capsule (60 mg total) by mouth daily. 90 capsule 1  . hydrocortisone (ANUSOL-HC) 25 MG suppository Place 1 suppository (25 mg total) rectally 3 (three) times daily as needed for hemorrhoids or itching. (Patient taking differently: Place 25 mg rectally as needed for hemorrhoids. ) 24 suppository 11  . ipratropium (ATROVENT) 0.06 % nasal spray USE 2 SPRAYS IN BOTH  NOSTRILS EVERY 8 HOURS AS  NEEDED FOR RHINITIS 105 mL 0  . Latanoprost 0.005 % EMUL Place 1 drop into both eyes at bedtime.    Marland Kitchen levothyroxine (SYNTHROID) 50 MCG tablet Take 1 tablet (50 mcg total) by mouth daily before breakfast. 90 tablet 2  . loratadine (CLARITIN) 10 MG tablet Take 10 mg by mouth daily.    . Menatetrenone (VITAMIN K2) 100 MCG TABS Take 1 tablet by mouth daily.    . Multiple Vitamin (MULTIVITAMINS PO) Take by mouth.    . polyethylene glycol (MIRALAX / GLYCOLAX) packet Take 17 g by mouth as needed.     . Timolol Maleate PF 0.5 % SOLN Place 1 drop into both eyes in the morning and at  bedtime.    . Tiotropium Bromide Monohydrate (SPIRIVA RESPIMAT) 2.5 MCG/ACT AERS Inhale 2 puffs into the lungs daily. 12 g 3  . AMBULATORY NON FORMULARY MEDICATION Medication Name: Shingrix IM x 1. Repeat in 2-6 months. 1 vial 0  . Netarsudil-Latanoprost (ROCKLATAN) 0.02-0.005 % SOLN Place 1 drop into both eyes at bedtime.     No facility-administered medications prior to visit.  Allergies  Allergen Reactions  . Alphagan P [Brimonidine Tartrate]     rash  . Netarsudil-Latanoprost Other (See Comments)    Causes eye irritation, eyelid swelling   . Codeine Rash  . Penicillins Rash  . Sulfur Rash    ROS Review of Systems    Objective:    Physical Exam  Constitutional: She is oriented to person, place, and time. She appears well-developed and well-nourished.  HENT:  Head: Normocephalic and atraumatic.  Right Ear: External ear normal.  Left Ear: External ear normal.  Eyes: Conjunctivae are normal.  Neck: No thyromegaly present.  Cardiovascular: Normal rate, regular rhythm and normal heart sounds.  Pulmonary/Chest: Effort normal and breath sounds normal.  Musculoskeletal:        General: No edema.     Cervical back: Neck supple.  Lymphadenopathy:    She has no cervical adenopathy.  Neurological: She is alert and oriented to person, place, and time. No cranial nerve deficit.  Skin: Skin is warm and dry.  Psychiatric: She has a normal mood and affect. Her behavior is normal.    BP 133/64   Pulse 86   Ht 5\' 2"  (1.575 m)   Wt 165 lb (74.8 kg)   SpO2 96%   BMI 30.18 kg/m  Wt Readings from Last 3 Encounters:  09/05/19 165 lb (74.8 kg)  05/22/19 171 lb (77.6 kg)  05/06/19 165 lb (74.8 kg)     There are no preventive care reminders to display for this patient.  There are no preventive care reminders to display for this patient.  Lab Results  Component Value Date   TSH 3.38 05/20/2019   Lab Results  Component Value Date   WBC 5.9 05/20/2019   HGB 13.4  05/20/2019   HCT 39.1 05/20/2019   MCV 92.7 05/20/2019   PLT 217 05/20/2019   Lab Results  Component Value Date   NA 141 05/20/2019   K 3.8 05/20/2019   CO2 28 05/20/2019   GLUCOSE 102 (H) 05/20/2019   BUN 16 05/20/2019   CREATININE 0.98 (H) 05/20/2019   BILITOT 1.1 05/20/2019   ALKPHOS 77 04/25/2016   AST 20 05/20/2019   ALT 17 05/20/2019   PROT 7.6 05/20/2019   ALBUMIN 4.0 04/25/2016   CALCIUM 9.6 05/20/2019   Lab Results  Component Value Date   CHOL 160 05/20/2019   Lab Results  Component Value Date   HDL 58 05/20/2019   Lab Results  Component Value Date   LDLCALC 76 05/20/2019   Lab Results  Component Value Date   TRIG 159 (H) 05/20/2019   Lab Results  Component Value Date   CHOLHDL 2.8 05/20/2019   Lab Results  Component Value Date   HGBA1C 5.6 04/26/2017      Assessment & Plan:   Problem List Items Addressed This Visit      Endocrine   Hypothyroid    Thyroid level in January looked great at 3.3 we will just continue to plan to recheck again in 6 months.  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         Genitourinary   CKD (chronic kidney disease), stage III - Primary    Continue to monitor renal function every 6 months.        Other   Fibromyalgia (Chronic)    Able on current regimen.  No recent flares or exacerbations.       Other Visit Diagnoses  Hair loss       Atypical chest pain       Relevant Orders   Ambulatory referral to Cardiology     Atypical chest pain-it actually starts up in her right jaw and radiates into her right ear so initially it sounded a little bit like TMJ or trigeminal nerve issue but then it was followed by chest pain which is a little unusual.  But she says it happened a couple times she have notation of one-vessel coronary artery disease.  I think overall she is probably low risk but since it has happened several times I think it is probably worth getting her  in with cardiology for further work-up.  We actually did an EKG several months ago for preop with no concerning findings.  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  Recommend a trial of women's Rogaine for 6 months.  If not improving then please let us know could consider additional treatments such as finasteride if needed. No orders of the defined types were placed in this encounter.   Follow-up: Return in about 6 months (around 03/06/2020) for Fibro and thyroid check .    Beatrice Lecher, MD

## 2019-09-05 NOTE — Assessment & Plan Note (Signed)
Able on current regimen.  No recent flares or exacerbations.

## 2019-09-05 NOTE — Assessment & Plan Note (Signed)
Thyroid level in January looked great at 3.3 we will just continue to plan to recheck again in 6 months.  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

## 2019-09-05 NOTE — Assessment & Plan Note (Signed)
Continue to monitor renal function every 6 months. 

## 2019-09-05 NOTE — Patient Instructions (Signed)
You can try Rogain for women for your hair loss. Use as directec.

## 2019-09-06 DIAGNOSIS — H401122 Primary open-angle glaucoma, left eye, moderate stage: Secondary | ICD-10-CM | POA: Diagnosis not present

## 2019-09-06 DIAGNOSIS — H401111 Primary open-angle glaucoma, right eye, mild stage: Secondary | ICD-10-CM | POA: Diagnosis not present

## 2019-09-25 NOTE — Progress Notes (Signed)
Referring-Catherine Linford Arnold, MD Reason for referral-chest pain  HPI: 79 year old female for evaluation of chest pain at request of Nani Gasser MD.  Chest CT September 2018 showed mild emphysema and one-vessel coronary atherosclerosis.  Patient states that she has had intermittent right jaw/ear pain radiating to her right shoulder and back.  It is not exertional.  Typically last 10 to 15 minutes and resolves spontaneously.  No associated nausea, dyspnea or diaphoresis.  The pain is not pleuritic.  She otherwise denies chest pain with activities.  She does have dyspnea on exertion which is chronic.  No orthopnea, PND or pedal edema.  No syncope.  Cardiology now asked to evaluate.  Current Outpatient Medications  Medication Sig Dispense Refill  . albuterol (PROAIR HFA) 108 (90 Base) MCG/ACT inhaler INHALE 2 PUFFS INTO THE LUNGS EVERY 4 (FOUR) HOURS AS NEEDED FOR WHEEZING OR SHORTNESS OF BREATH. 18 g 2  . aspirin 81 MG tablet Take 81 mg by mouth daily.    Marland Kitchen atorvastatin (LIPITOR) 10 MG tablet Take 1 tablet (10 mg total) by mouth daily. 90 tablet 3  . bifidobacterium infantis (ALIGN) capsule Take 1 capsule by mouth daily.    . Calcium-Phosphorus-Vitamin D (CALCIUM/D3 ADULT GUMMIES PO) Take 2 tablets daily by mouth. 2 gummies daily with meal    . Cholecalciferol (VITAMIN D PO) Take 3,000 mg by mouth daily.     . DENTAGEL 1.1 % GEL dental gel Apply 1.1 application topically daily.    . DULoxetine (CYMBALTA) 60 MG capsule Take 1 capsule (60 mg total) by mouth daily. 90 capsule 1  . hydrocortisone (ANUSOL-HC) 25 MG suppository Place 1 suppository (25 mg total) rectally 3 (three) times daily as needed for hemorrhoids or itching. (Patient taking differently: Place 25 mg rectally as needed for hemorrhoids. ) 24 suppository 11  . ipratropium (ATROVENT) 0.06 % nasal spray USE 2 SPRAYS IN BOTH  NOSTRILS EVERY 8 HOURS AS  NEEDED FOR RHINITIS 105 mL 0  . Latanoprost 0.005 % EMUL Place 1 drop into both  eyes at bedtime.    Marland Kitchen levothyroxine (SYNTHROID) 50 MCG tablet Take 1 tablet (50 mcg total) by mouth daily before breakfast. 90 tablet 2  . loratadine (CLARITIN) 10 MG tablet Take 10 mg by mouth daily.    . Menatetrenone (VITAMIN K2) 100 MCG TABS Take 1 tablet by mouth daily.    . Multiple Vitamin (MULTIVITAMINS PO) Take by mouth.    . polyethylene glycol (MIRALAX / GLYCOLAX) packet Take 17 g by mouth as needed.     . Timolol Maleate PF 0.5 % SOLN Place 1 drop into both eyes in the morning and at bedtime.    . Tiotropium Bromide Monohydrate (SPIRIVA RESPIMAT) 2.5 MCG/ACT AERS Inhale 2 puffs into the lungs daily. 12 g 3   No current facility-administered medications for this visit.    Allergies  Allergen Reactions  . Alphagan P [Brimonidine Tartrate]     rash  . Netarsudil-Latanoprost Other (See Comments)    Causes eye irritation, eyelid swelling   . Codeine Rash  . Penicillins Rash  . Sulfur Rash     Past Medical History:  Diagnosis Date  . Anxiety    intermittent  . blood in stool   . COPD (chronic obstructive pulmonary disease) (HCC)   . Diverticulosis   . Fibromyalgia   . GERD (gastroesophageal reflux disease)   . Glaucoma   . Hemorrhoids   . HSV-2 (herpes simplex virus 2) infection 2009  . OSA on  CPAP 07/27/06  . Osteoporosis   . Rectal bleeding    with hemorrhoids    Past Surgical History:  Procedure Laterality Date  . BUNIONECTOMY  [id number]  . CARPAL TUNNEL RELEASE Right 12/08/2016   Procedure: CARPAL TUNNEL RELEASE;  Surgeon: Cindee Salt, MD;  Location: [facility];  Service: Orthopedics;  Laterality: Right;  REG/FAB  . CERVICAL FUSION  2008  . hemorrhoids removed    . TRIGGER FINGER RELEASE  2008    Social History   Socioeconomic History  . Marital status: Married    Spouse name: [name]  . Number of children: 3  . Years of education: 67  . Highest education level: Bachelor's degree (e.g., BA, AB, BS)  Occupational History  . Occupation:  Armed forces operational officer    Comment: retired  Tobacco Use  . Smoking status: Former Smoker    Packs/day: 2.00    Years: 25.00    Pack years: 50.00    Quit date: 03/07/1981    Years since quitting: 38.5  . Smokeless tobacco: Never Used  Substance and Sexual Activity  . Alcohol use: Yes    Alcohol/week: 0.0 standard drinks    Comment: 2 drinks per month socially  . Drug use: No  . Sexual activity: Not Currently  Other Topics Concern  . Not on file  Social History Narrative   : former smoker, tobacco history last updated 10/28/2013. No smoking. No tobacco exposure, no alcohol. No caffeine. No recreational drug use. Trying to stay away from Gyms due to COVID. Is going look into silver sneakers program online.   Social Determinants of Health   Financial Resource Strain:   . Difficulty of Paying Living Expenses:   Food Insecurity:   . Worried About Programme researcher, broadcasting/film/video in the Last Year:   . Barista in the Last Year:   Transportation Needs:   . Freight forwarder (Medical):   Marland Kitchen Lack of Transportation (Non-Medical):   Physical Activity:   . Days of Exercise per Week:   . Minutes of Exercise per Session:   Stress:   . Feeling of Stress :   Social Connections:   . Frequency of Communication with Friends and Family:   . Frequency of Social Gatherings with Friends and Family:   . Attends Religious Services:   . Active Member of Clubs or Organizations:   . Attends Banker Meetings:   Marland Kitchen Marital Status:   Intimate Partner Violence:   . Fear of Current or Ex-Partner:   . Emotionally Abused:   Marland Kitchen Physically Abused:   . Sexually Abused:     Family History  Problem Relation Age of Onset  . Heart attack Father   . CAD Father   . Hyperlipidemia Mother   . Hypertension Mother   . CAD Mother     ROS: no fevers or chills, productive cough, hemoptysis, dysphasia, odynophagia, melena, hematochezia, dysuria, hematuria, rash, seizure activity, orthopnea, PND, pedal  edema, claudication. Remaining systems are negative.  Physical Exam:   Blood pressure 124/70, pulse 98, height 5\' 2"  (1.575 m), weight 163 lb 1.9 oz (74 kg).  General:  Well developed/well nourished in NAD Skin warm/dry Patient not depressed No peripheral clubbing Back-normal HEENT-normal/normal eyelids Neck supple/normal carotid upstroke bilaterally; no bruits; no JVD; no thyromegaly chest - CTA/ normal expansion CV - RRR/normal S1 and S2; no murmurs, rubs or gallops;  PMI nondisplaced Abdomen -NT/ND, no HSM, no mass, + bowel sounds, no bruit 2+ femoral  pulses, no bruits Ext-no edema, chords, 2+ DP; varicosities noted Neuro-grossly nonfocal  ECG -sinus rhythm at a rate of 98, no ST changes.  Personally reviewed  A/P  1 chest pain-symptoms are atypical.  However risk factors are noted.  Plan Lexiscan nuclear study for risk stratification.  2 COPD-Per primary care.   3 history of coronary calcification-continue aspirin and statin.  4 hyperlipidemia-continue statin.  Kirk Ruths, MD

## 2019-10-02 ENCOUNTER — Encounter: Payer: Self-pay | Admitting: Cardiology

## 2019-10-02 ENCOUNTER — Encounter: Payer: Self-pay | Admitting: *Deleted

## 2019-10-02 ENCOUNTER — Other Ambulatory Visit: Payer: Self-pay

## 2019-10-02 ENCOUNTER — Ambulatory Visit: Payer: Medicare PPO | Admitting: Cardiology

## 2019-10-02 VITALS — BP 124/70 | HR 98 | Ht 62.0 in | Wt 163.1 lb

## 2019-10-02 DIAGNOSIS — E782 Mixed hyperlipidemia: Secondary | ICD-10-CM

## 2019-10-02 DIAGNOSIS — R072 Precordial pain: Secondary | ICD-10-CM

## 2019-10-02 NOTE — Patient Instructions (Signed)
Medication Instructions:  NO CHANGE *If you need a refill on your cardiac medications before your next appointment, please call your pharmacy*   Lab Work: If you have labs (blood work) drawn today and your tests are completely normal, you will receive your results only by: Marland Kitchen MyChart Message (if you have MyChart) OR . A paper copy in the mail If you have any lab test that is abnormal or we need to change your treatment, we will call you to review the results.   Testing/Procedures:  Your physician has requested that you have a lexiscan myoview. For further information please visit [email or web address] Please follow instruction sheet, as given. [address]     Follow-Up: At [facility], you and your health needs are our priority.  As part of our continuing mission to provide you with exceptional heart care, we have created designated Provider Care Teams.  These Care Teams include your primary Cardiologist (physician) and Advanced Practice Providers (APPs -  Physician Assistants and Nurse Practitioners) who all work together to provide you with the care you need, when you need it.  We recommend signing up for the patient portal called "MyChart".  Sign up information is provided on this After Visit Summary.  MyChart is used to connect with patients for Virtual Visits (Telemedicine).  Patients are able to view lab/test results, encounter notes, upcoming appointments, etc.  Non-urgent messages can be sent to your provider as well.   To learn more about what you can do with MyChart, go to [email or web address].    Your next appointment:    AS NEEDED

## 2019-10-09 ENCOUNTER — Telehealth (HOSPITAL_COMMUNITY): Payer: Self-pay | Admitting: *Deleted

## 2019-10-09 NOTE — Telephone Encounter (Signed)
Patient given detailed instructions per Myocardial Perfusion Study Information Sheet for the test on 10/14/2019 at 1030. Patient notified to arrive 15 minutes early and that it is imperative to arrive on time for appointment to keep from having the test rescheduled.  If you need to cancel or reschedule your appointment, please call the office within 24 hours of your appointment. . Patient verbalized understanding.Janea Schwenn, Rise Paganini letter sent with instructions

## 2019-10-14 ENCOUNTER — Ambulatory Visit (HOSPITAL_COMMUNITY): Payer: Medicare PPO | Attending: Cardiovascular Disease

## 2019-10-14 ENCOUNTER — Encounter (HOSPITAL_COMMUNITY): Payer: Medicare PPO

## 2019-10-14 ENCOUNTER — Other Ambulatory Visit: Payer: Self-pay

## 2019-10-14 ENCOUNTER — Encounter (HOSPITAL_COMMUNITY): Payer: Self-pay

## 2019-10-14 DIAGNOSIS — R072 Precordial pain: Secondary | ICD-10-CM | POA: Diagnosis not present

## 2019-10-14 MED ORDER — REGADENOSON 0.4 MG/5ML IV SOLN
0.4000 mg | Freq: Once | INTRAVENOUS | Status: AC
  Administered 2019-10-14: 0.4 mg via INTRAVENOUS

## 2019-10-14 MED ORDER — TECHNETIUM TC 99M TETROFOSMIN IV KIT
32.6000 | PACK | Freq: Once | INTRAVENOUS | Status: AC | PRN
  Administered 2019-10-14: 32.6 via INTRAVENOUS
  Filled 2019-10-14: qty 33

## 2019-10-17 ENCOUNTER — Other Ambulatory Visit: Payer: Self-pay

## 2019-10-17 ENCOUNTER — Ambulatory Visit (HOSPITAL_COMMUNITY): Payer: Medicare PPO | Attending: Cardiovascular Disease

## 2019-10-17 LAB — MYOCARDIAL PERFUSION IMAGING
LV dias vol: 47 mL (ref 46–106)
LV sys vol: 14 mL
Peak HR: 108 {beats}/min
Rest HR: 78 {beats}/min
SDS: 1
SRS: 0
SSS: 1
TID: 0.99

## 2019-10-17 MED ORDER — TECHNETIUM TC 99M TETROFOSMIN IV KIT
31.3000 | PACK | Freq: Once | INTRAVENOUS | Status: AC | PRN
  Administered 2019-10-17: 31.3 via INTRAVENOUS
  Filled 2019-10-17: qty 32

## 2019-10-22 ENCOUNTER — Ambulatory Visit (HOSPITAL_COMMUNITY): Payer: Medicare PPO

## 2019-11-12 DIAGNOSIS — G4733 Obstructive sleep apnea (adult) (pediatric): Secondary | ICD-10-CM | POA: Diagnosis not present

## 2019-12-09 ENCOUNTER — Telehealth: Payer: Self-pay | Admitting: Emergency Medicine

## 2019-12-09 DIAGNOSIS — J309 Allergic rhinitis, unspecified: Secondary | ICD-10-CM

## 2019-12-09 MED ORDER — IPRATROPIUM BROMIDE 0.06 % NA SOLN: NASAL | 1 refills | Status: DC

## 2019-12-09 NOTE — Telephone Encounter (Signed)
Called and spoke with pt and verified med and preferred pharmacy. Rx for ipratropium nasal spray has been sent to preferred pharmacy for pt. Nothing further needed.

## 2019-12-25 ENCOUNTER — Telehealth: Payer: Self-pay

## 2019-12-25 NOTE — Telephone Encounter (Signed)
[name] [name] called and states she was in contact, not close contact, with someone with COVID 9 days ago. She has been vaccinated. Advised -   Quarantine if you have been in close contact (within 6 feet of someone for a cumulative total of 15 minutes or more over a 24-hour period) with someone who has COVID-19, unless you have been fully vaccinated. People who are fully vaccinated do NOT need to quarantine after contact with someone who had COVID-19 unless they have symptoms. However, fully vaccinated people should get tested 3-5 days after their exposure, even if they don't have symptoms and wear a mask indoors in public for 14 days following exposure or until their test result is negative

## 2019-12-25 NOTE — Telephone Encounter (Signed)
LMOM to recommend she get tested.

## 2019-12-28 DIAGNOSIS — Z20822 Contact with and (suspected) exposure to covid-19: Secondary | ICD-10-CM | POA: Diagnosis not present

## 2020-01-02 DIAGNOSIS — H527 Unspecified disorder of refraction: Secondary | ICD-10-CM | POA: Diagnosis not present

## 2020-01-02 DIAGNOSIS — H25813 Combined forms of age-related cataract, bilateral: Secondary | ICD-10-CM | POA: Diagnosis not present

## 2020-01-02 DIAGNOSIS — H02403 Unspecified ptosis of bilateral eyelids: Secondary | ICD-10-CM | POA: Diagnosis not present

## 2020-01-02 DIAGNOSIS — H401113 Primary open-angle glaucoma, right eye, severe stage: Secondary | ICD-10-CM | POA: Diagnosis not present

## 2020-01-27 ENCOUNTER — Encounter: Payer: Self-pay | Admitting: Emergency Medicine

## 2020-01-27 ENCOUNTER — Ambulatory Visit: Payer: Medicare PPO | Admitting: Emergency Medicine

## 2020-01-27 ENCOUNTER — Other Ambulatory Visit: Payer: Self-pay

## 2020-01-27 VITALS — BP 120/64 | HR 81 | Temp 97.7°F | Ht 62.0 in | Wt 162.0 lb

## 2020-01-27 DIAGNOSIS — G4733 Obstructive sleep apnea (adult) (pediatric): Secondary | ICD-10-CM | POA: Diagnosis not present

## 2020-01-27 DIAGNOSIS — J432 Centrilobular emphysema: Secondary | ICD-10-CM | POA: Diagnosis not present

## 2020-01-27 DIAGNOSIS — J309 Allergic rhinitis, unspecified: Secondary | ICD-10-CM | POA: Diagnosis not present

## 2020-01-27 DIAGNOSIS — Z23 Encounter for immunization: Secondary | ICD-10-CM | POA: Diagnosis not present

## 2020-01-27 DIAGNOSIS — Z9989 Dependence on other enabling machines and devices: Secondary | ICD-10-CM

## 2020-01-27 NOTE — Progress Notes (Signed)
Subjective:    Patient ID: [name] [name], female    DOB: Apr 14, 1941, 79 y.o.   MRN: [id number]  HPI  ROV 02/21/18 --79 year old woman with a history of COPD, obstructive sleep apnea on CPAP.  Also with a history of fibromyalgia, GERD, osteoporosis. We had also followed small pulmonary nodules for 3 yrs, felt to be benign. She returns today reporting that her breathing has been stable - she can have some SOB with showers, is in humidity, with some home chores. Her allergies become active but she is using her atrovent NS, loratadine. She uses albuterol about a few times a month. She is using CPAP reliably, benefits from it. Has all equipment, in good repair.   ROV 01/27/20 --79 year old woman followed for COPD, OSA on CPAP, allergic rhinitis.  She also has fibromyalgia, GERD, osteoporosis.  We followed serial CT scans to track pulmonary nodules which were deemed to be benign.  Has been on loratadine, Atrovent nasal spray, nasal saline rinses.  Started on Incruse mid 2020, formulary change to Spiriva Respimat last year. She believes that the Spiriva is helping her. She has noticed a bit more exertional SOB when she is doing chores around the house. She had to stop recently when doing a steep hill. She uses albuterol about once a day, more recently as she has been doing more exertion. Occasional wheeze. She has a dry UA type cough, not every day. Her last prednisone was over a year ago. Good compliance with CPAP, significant clinical benefit.    Review of Systems  Constitutional: Negative for fever and unexpected weight change.  HENT: Negative for congestion, dental problem, ear pain, nosebleeds, postnasal drip, rhinorrhea, sinus pressure, sneezing, sore throat and trouble swallowing.   Eyes: Negative for redness and itching.  Respiratory: Positive for chest tightness and shortness of breath. Negative for cough and wheezing.   Cardiovascular: Negative for palpitations and leg swelling.   Gastrointestinal: Negative for nausea and vomiting.  Genitourinary: Negative for dysuria.  Musculoskeletal: Negative for joint swelling.  Skin: Negative for rash.  Neurological: Negative for headaches.  Hematological: Does not bruise/bleed easily.  Psychiatric/Behavioral: Negative for dysphoric mood. The patient is not nervous/anxious.        Objective:   Physical Exam Vitals:   01/27/20 1152  BP: 120/64  Pulse: 81  Temp: 97.7 F (36.5 C)  TempSrc: Temporal  SpO2: 97%  Weight: 162 lb (73.5 kg)  Height: 5\' 2"  (1.575 m)   Gen: Pleasant, overwt woman, in no distress,  normal affect  ENT: No lesions,  mouth clear,  oropharynx clear, no postnasal drip  Neck: No JVD, no stridor  Lungs: No use of accessory muscles, Distant but clear, no wheezing  Cardiovascular: RRR, heart sounds normal, no murmur or gallops, no peripheral edema  Musculoskeletal: No deformities, no cyanosis or clubbing  Neuro: alert, non focal  Skin: Warm, no lesions or rashes     Assessment & Plan:  Centrilobular emphysema (HCC) Continue Spiriva Respimat 2 puffs once daily. Keep your albuterol available to use 2 puffs if needed for shortness of breath, chest tightness, wheezing. Agree with getting back to a daily exercise routine.  This will help your breathing and your overall functional capacity COVID-19 vaccine up-to-date Flu shot today Follow with Dr. in 12 months or sooner if you have any problems  Allergic rhinitis Continue your loratadine, Atrovent nasal spray and nasal saline rinses as you have been doing them.  Obstructive sleep apnea on CPAP Wear your CPAP  every night.  Congratulations on your good compliance.  Levy Pupa, MD, PhD 01/27/2020, 12:27 PM [facility] [phone] or if no answer [phone]

## 2020-01-27 NOTE — Assessment & Plan Note (Signed)
Continue your loratadine, Atrovent nasal spray and nasal saline rinses as you have been doing them.

## 2020-01-27 NOTE — Patient Instructions (Signed)
Continue Spiriva Respimat 2 puffs once daily. Keep your albuterol available to use 2 puffs if needed for shortness of breath, chest tightness, wheezing. Agree with getting back to a daily exercise routine.  This will help your breathing and your overall functional capacity Continue your loratadine, Atrovent nasal spray and nasal saline rinses as you have been doing them. Wear your CPAP every night.  Congratulations on your good compliance. COVID-19 vaccine up-to-date Flu shot today Follow with Dr. Delton Coombes in 12 months or sooner if you have any problems.

## 2020-01-27 NOTE — Assessment & Plan Note (Signed)
Continue Spiriva Respimat 2 puffs once daily. Keep your albuterol available to use 2 puffs if needed for shortness of breath, chest tightness, wheezing. Agree with getting back to a daily exercise routine.  This will help your breathing and your overall functional capacity COVID-19 vaccine up-to-date Flu shot today Follow with Dr. Delton Coombes in 12 months or sooner if you have any problems

## 2020-01-27 NOTE — Assessment & Plan Note (Signed)
Wear your CPAP every night.  Congratulations on your good compliance.

## 2020-02-11 ENCOUNTER — Ambulatory Visit (INDEPENDENT_AMBULATORY_CARE_PROVIDER_SITE_OTHER): Payer: Medicare PPO

## 2020-02-11 ENCOUNTER — Ambulatory Visit (INDEPENDENT_AMBULATORY_CARE_PROVIDER_SITE_OTHER): Payer: Medicare PPO | Admitting: Sports Medicine

## 2020-02-11 ENCOUNTER — Other Ambulatory Visit: Payer: Self-pay

## 2020-02-11 ENCOUNTER — Other Ambulatory Visit: Payer: Self-pay | Admitting: Family Medicine

## 2020-02-11 DIAGNOSIS — M1711 Unilateral primary osteoarthritis, right knee: Secondary | ICD-10-CM | POA: Diagnosis not present

## 2020-02-11 DIAGNOSIS — M1712 Unilateral primary osteoarthritis, left knee: Secondary | ICD-10-CM | POA: Diagnosis not present

## 2020-02-11 DIAGNOSIS — Z1231 Encounter for screening mammogram for malignant neoplasm of breast: Secondary | ICD-10-CM

## 2020-02-11 MED ORDER — CELECOXIB 200 MG PO CAPS: ORAL_CAPSULE | ORAL | 2 refills | Status: DC

## 2020-02-11 NOTE — Progress Notes (Signed)
    Procedures performed today:    None.  Independent interpretation of notes and tests performed by another provider:   None.  Brief History, Exam, Impression, and Recommendations:    Adream is a pleasant 79yo female who has been having left knee pain for the past 2 and a half weeks. It began when she took a wrong step down the stairs. It was beginning to get better until Sunday when she twisted it wrong and it began to hurt a little more. She has been using voltaren gel which has helped with the pain. She does not report any clicking or popping. She does report some chronic aching pain in her knees but this is worse. She feels the pain radiate down the back of her thigh and calf. On exam she had pain with terminal flexion of her knee. She does have a history of osteoporosis. We are going to get xrays of her knee today and get her set up with PT. We are giving her celebrex for inflammation. WE also injected her knee today. She will see [place] back in 4-6 weeks for reevaluation.   Aurelio Jew, MS3   ___________________________________________ [name]. Benjamin Stain, M.D., ABFM., CAQSM. Primary Care and Sports Medicine [facility]  Adjunct Instructor of Family Medicine  [employer]

## 2020-02-11 NOTE — Assessment & Plan Note (Signed)
This is a pleasant 79 year old female, she has been having increasing pain in her left knee, posterior/medial joint line, positive McMurray's sign, pain with terminal flexion and mild effusion. This all occurred after a slight misstep. Voltaren gel has been ineffective, we injected her knee today, adding some home rehab exercises, x-rays, Celebrex as she does have PUD. Return to see me in 4 to 6 weeks.

## 2020-02-19 ENCOUNTER — Other Ambulatory Visit: Payer: Self-pay | Admitting: Family Medicine

## 2020-02-19 DIAGNOSIS — M25551 Pain in right hip: Secondary | ICD-10-CM

## 2020-02-19 DIAGNOSIS — M797 Fibromyalgia: Secondary | ICD-10-CM

## 2020-02-20 ENCOUNTER — Other Ambulatory Visit: Payer: Self-pay

## 2020-02-20 ENCOUNTER — Ambulatory Visit (INDEPENDENT_AMBULATORY_CARE_PROVIDER_SITE_OTHER): Payer: Medicare PPO

## 2020-02-20 DIAGNOSIS — Z1231 Encounter for screening mammogram for malignant neoplasm of breast: Secondary | ICD-10-CM

## 2020-03-06 ENCOUNTER — Other Ambulatory Visit: Payer: Self-pay

## 2020-03-06 ENCOUNTER — Ambulatory Visit (INDEPENDENT_AMBULATORY_CARE_PROVIDER_SITE_OTHER): Payer: Medicare PPO | Admitting: Family Medicine

## 2020-03-06 ENCOUNTER — Encounter: Payer: Self-pay | Admitting: Family Medicine

## 2020-03-06 VITALS — BP 131/66 | HR 84 | Ht 62.0 in | Wt 160.0 lb

## 2020-03-06 DIAGNOSIS — H409 Unspecified glaucoma: Secondary | ICD-10-CM | POA: Diagnosis not present

## 2020-03-06 DIAGNOSIS — E039 Hypothyroidism, unspecified: Secondary | ICD-10-CM

## 2020-03-06 DIAGNOSIS — N1831 Chronic kidney disease, stage 3a: Secondary | ICD-10-CM

## 2020-03-06 DIAGNOSIS — M8580 Other specified disorders of bone density and structure, unspecified site: Secondary | ICD-10-CM

## 2020-03-06 DIAGNOSIS — R221 Localized swelling, mass and lump, neck: Secondary | ICD-10-CM

## 2020-03-06 NOTE — Assessment & Plan Note (Signed)
On renal function every 6 months. 

## 2020-03-06 NOTE — Progress Notes (Signed)
Established Patient Office Visit  Subjective:  Patient ID: [name] [name], female    DOB: 19-Oct-1940  Age: 79 y.o. MRN: [id number]  CC:  Chief Complaint  Patient presents with  . Hypertension  . Hypothyroidism    HPI NOVICE VRBA presents for 6 mo f/u   Hypothyroidism - Taking medication regularly in the AM away from food and vitamins, etc. No recent change to skin, hair, or energy levels.  F/U CKD 3 - following Q 6 months.    No recent changes.    She has noticed a lump over her sternoclavicular notch.  She says its been there for at least several months is not painful or bothersome but she did notice that   Past Medical History:  Diagnosis Date  . Anxiety    intermittent  . blood in stool   . COPD (chronic obstructive pulmonary disease) (HCC)   . Diverticulosis   . Fibromyalgia   . GERD (gastroesophageal reflux disease)   . Glaucoma   . Hemorrhoids   . HSV-2 (herpes simplex virus 2) infection 2009  . OSA on CPAP 07/27/06  . Osteoporosis   . Rectal bleeding    with hemorrhoids    Past Surgical History:  Procedure Laterality Date  . BUNIONECTOMY  [id number]  . CARPAL TUNNEL RELEASE Right 12/08/2016   Procedure: CARPAL TUNNEL RELEASE;  Surgeon: Cindee Salt, MD;  Location: [facility];  Service: Orthopedics;  Laterality: Right;  REG/FAB  . CERVICAL FUSION  2008  . hemorrhoids removed    . TRIGGER FINGER RELEASE  2008    Family History  Problem Relation Age of Onset  . Heart attack Father   . CAD Father   . Hyperlipidemia Mother   . Hypertension Mother   . CAD Mother     Social History   Socioeconomic History  . Marital status: Married    Spouse name: [name]  . Number of children: 3  . Years of education: 23  . Highest education level: Bachelor's degree (e.g., BA, AB, BS)  Occupational History  . Occupation: Armed forces operational officer    Comment: retired  Tobacco Use  . Smoking status: Former Smoker    Packs/day: 2.00    Years:  25.00    Pack years: 50.00    Quit date: 03/07/1981    Years since quitting: 39.0  . Smokeless tobacco: Never Used  Vaping Use  . Vaping Use: Never used  Substance and Sexual Activity  . Alcohol use: Yes    Alcohol/week: 0.0 standard drinks    Comment: 2 drinks per month socially  . Drug use: No  . Sexual activity: Not Currently  Other Topics Concern  . Not on file  Social History Narrative   : former smoker, tobacco history last updated 10/28/2013. No smoking. No tobacco exposure, no alcohol. No caffeine. No recreational drug use. Trying to stay away from Gyms due to COVID. Is going look into silver sneakers program online.   Social Determinants of Health   Financial Resource Strain:   . Difficulty of Paying Living Expenses: Not on file  Food Insecurity:   . Worried About Programme researcher, broadcasting/film/video in the Last Year: Not on file  . Ran Out of Food in the Last Year: Not on file  Transportation Needs:   . Lack of Transportation (Medical): Not on file  . Lack of Transportation (Non-Medical): Not on file  Physical Activity:   . Days of Exercise per Week: Not on file  .  Minutes of Exercise per Session: Not on file  Stress:   . Feeling of Stress : Not on file  Social Connections:   . Frequency of Communication with Friends and Family: Not on file  . Frequency of Social Gatherings with Friends and Family: Not on file  . Attends Religious Services: Not on file  . Active Member of Clubs or Organizations: Not on file  . Attends BankerClub or Organization Meetings: Not on file  . Marital Status: Not on file  Intimate Partner Violence:   . Fear of Current or Ex-Partner: Not on file  . Emotionally Abused: Not on file  . Physically Abused: Not on file  . Sexually Abused: Not on file    Outpatient Medications Prior to Visit  Medication Sig Dispense Refill  . DULoxetine (CYMBALTA) 60 MG capsule TAKE 1 CAPSULE EVERY DAY 90 capsule 1  . albuterol (PROAIR HFA) 108 (90 Base) MCG/ACT inhaler INHALE 2  PUFFS INTO THE LUNGS EVERY 4 (FOUR) HOURS AS NEEDED FOR WHEEZING OR SHORTNESS OF BREATH. 18 g 2  . aspirin 81 MG tablet Take 81 mg by mouth daily.    Marland Kitchen. atorvastatin (LIPITOR) 10 MG tablet Take 1 tablet (10 mg total) by mouth daily. 90 tablet 3  . bifidobacterium infantis (ALIGN) capsule Take 1 capsule by mouth daily.    . Calcium-Phosphorus-Vitamin D (CALCIUM/D3 ADULT GUMMIES PO) Take 2 tablets daily by mouth. 2 gummies daily with meal    . celecoxib (CELEBREX) 200 MG capsule One to 2 tablets by mouth daily as needed for pain. 60 capsule 2  . Cholecalciferol (VITAMIN D PO) Take 3,000 mg by mouth daily.     . DENTAGEL 1.1 % GEL dental gel Apply 1.1 application topically daily.    . hydrocortisone (ANUSOL-HC) 25 MG suppository Place 1 suppository (25 mg total) rectally 3 (three) times daily as needed for hemorrhoids or itching. (Patient taking differently: Place 25 mg rectally as needed for hemorrhoids. ) 24 suppository 11  . ipratropium (ATROVENT) 0.06 % nasal spray USE 2 SPRAYS IN BOTH  NOSTRILS EVERY 8 HOURS AS  NEEDED FOR RHINITIS 105 mL 1  . Latanoprost 0.005 % EMUL Place 1 drop into both eyes at bedtime.    Marland Kitchen. levothyroxine (SYNTHROID) 50 MCG tablet Take 1 tablet (50 mcg total) by mouth daily before breakfast. 90 tablet 2  . loratadine (CLARITIN) 10 MG tablet Take 10 mg by mouth daily.    . Menatetrenone (VITAMIN K2) 100 MCG TABS Take 1 tablet by mouth daily.    . Multiple Vitamin (MULTIVITAMINS PO) Take by mouth.    . polyethylene glycol (MIRALAX / GLYCOLAX) packet Take 17 g by mouth as needed.     . Timolol Maleate PF 0.5 % SOLN Place 1 drop into both eyes in the morning and at bedtime.    . Tiotropium Bromide Monohydrate (SPIRIVA RESPIMAT) 2.5 MCG/ACT AERS Inhale 2 puffs into the lungs daily. 12 g 3   No facility-administered medications prior to visit.    Allergies  Allergen Reactions  . Alphagan P [Brimonidine Tartrate]     rash  . Netarsudil-Latanoprost Other (See Comments)     Causes eye irritation, eyelid swelling   . Codeine Rash  . Penicillins Rash  . Sulfur Rash    ROS Review of Systems    Objective:    Physical Exam Constitutional:      Appearance: She is well-developed.  HENT:     Head: Normocephalic and atraumatic.  Neck:  Comments: Does have a soft smooth area at the sternoclavicular notch that is palpable and noticeable just on visual inspection.  Possibly a lipoma. Cardiovascular:     Rate and Rhythm: Normal rate and regular rhythm.     Heart sounds: Normal heart sounds.  Pulmonary:     Effort: Pulmonary effort is normal.     Breath sounds: Normal breath sounds.  Skin:    General: Skin is warm and dry.  Neurological:     Mental Status: She is alert and oriented to person, place, and time.  Psychiatric:        Behavior: Behavior normal.     BP 140/73   Pulse 81   Ht 5\' 2"  (1.575 m)   Wt 160 lb (72.6 kg)   SpO2 97%   BMI 29.26 kg/m  Wt Readings from Last 3 Encounters:  03/06/20 160 lb (72.6 kg)  01/27/20 162 lb (73.5 kg)  10/14/19 163 lb (73.9 kg)     There are no preventive care reminders to display for this patient.  There are no preventive care reminders to display for this patient.  Lab Results  Component Value Date   TSH 3.38 05/20/2019   Lab Results  Component Value Date   WBC 5.9 05/20/2019   HGB 13.4 05/20/2019   HCT 39.1 05/20/2019   MCV 92.7 05/20/2019   PLT 217 05/20/2019   Lab Results  Component Value Date   NA 141 05/20/2019   K 3.8 05/20/2019   CO2 28 05/20/2019   GLUCOSE 102 (H) 05/20/2019   BUN 16 05/20/2019   CREATININE 0.98 (H) 05/20/2019   BILITOT 1.1 05/20/2019   ALKPHOS 77 04/25/2016   AST 20 05/20/2019   ALT 17 05/20/2019   PROT 7.6 05/20/2019   ALBUMIN 4.0 04/25/2016   CALCIUM 9.6 05/20/2019   Lab Results  Component Value Date   CHOL 160 05/20/2019   Lab Results  Component Value Date   HDL 58 05/20/2019   Lab Results  Component Value Date   LDLCALC 76 05/20/2019    Lab Results  Component Value Date   TRIG 159 (H) 05/20/2019   Lab Results  Component Value Date   CHOLHDL 2.8 05/20/2019   Lab Results  Component Value Date   HGBA1C 5.6 04/26/2017      Assessment & Plan:   Problem List Items Addressed This Visit      Endocrine   Hypothyroid - Primary    Stable.  No recent changes.  Taking medication regularly.  Due to repeat TSH.      Relevant Orders   TSH     Musculoskeletal and Integument   Osteopenia with high risk of fracture    Plan to check vitamin D level.      Relevant Orders   VITAMIN D 25 Hydroxy (Vit-D Deficiency, Fractures)     Genitourinary   CKD (chronic kidney disease), stage III (HCC)    On renal function every 6 months.      Relevant Orders   BASIC METABOLIC PANEL WITH GFR     Other   Glaucoma (Chronic)    She is actually having her cataracts done coming up.  Her glaucoma has been stable.  She follows with [facility] eye surgeons.       Other Visit Diagnoses    Neck mass       Relevant Orders   [facility] Soft Tissue Head/Neck (NON-THYROID)     She has had to use a walker recently after her  left knee injury she is been following with Dr. Rodney Langton.  Reports that she likely has a meniscal tear.  It is gradually getting some better.  We did write for a handicap form today.   No orders of the defined types were placed in this encounter.   Follow-up: Return in about 6 months (around 09/04/2020) for thyroid and kidney check up.    Nani Gasser, MD

## 2020-03-06 NOTE — Assessment & Plan Note (Signed)
Plan to check vitamin D level.

## 2020-03-06 NOTE — Assessment & Plan Note (Signed)
She is actually having her cataracts done coming up.  Her glaucoma has been stable.  She follows with [facility] eye surgeons.

## 2020-03-06 NOTE — Assessment & Plan Note (Signed)
Stable.  No recent changes.  Taking medication regularly.  Due to repeat TSH.

## 2020-03-07 LAB — VITAMIN D 25 HYDROXY (VIT D DEFICIENCY, FRACTURES): Vit D, 25-Hydroxy: 64 ng/mL (ref 30–100)

## 2020-03-07 LAB — BASIC METABOLIC PANEL WITH GFR
BUN: 22 mg/dL (ref 7–25)
CO2: 28 mmol/L (ref 20–32)
Calcium: 9.7 mg/dL (ref 8.6–10.4)
Chloride: 103 mmol/L (ref 98–110)
Creat: 0.88 mg/dL (ref 0.60–0.88)
GFR, Est African American: 71 mL/min/{1.73_m2} (ref >=60)
GFR, Est Non African American: 62 mL/min/{1.73_m2} (ref >=60)
Glucose, Bld: 68 mg/dL (ref 65–99)
Potassium: 4 mmol/L (ref 3.5–5.3)
Sodium: 139 mmol/L (ref 135–146)

## 2020-03-07 LAB — TSH: TSH: 5.63 mIU/L — ABNORMAL HIGH (ref 0.40–4.50)

## 2020-03-10 ENCOUNTER — Ambulatory Visit (INDEPENDENT_AMBULATORY_CARE_PROVIDER_SITE_OTHER): Payer: Medicare PPO | Admitting: Sports Medicine

## 2020-03-10 ENCOUNTER — Other Ambulatory Visit: Payer: Self-pay

## 2020-03-10 ENCOUNTER — Ambulatory Visit (INDEPENDENT_AMBULATORY_CARE_PROVIDER_SITE_OTHER): Payer: Medicare PPO

## 2020-03-10 DIAGNOSIS — R221 Localized swelling, mass and lump, neck: Secondary | ICD-10-CM

## 2020-03-10 DIAGNOSIS — E041 Nontoxic single thyroid nodule: Secondary | ICD-10-CM | POA: Diagnosis not present

## 2020-03-10 DIAGNOSIS — M7989 Other specified soft tissue disorders: Secondary | ICD-10-CM | POA: Diagnosis not present

## 2020-03-10 DIAGNOSIS — M1712 Unilateral primary osteoarthritis, left knee: Secondary | ICD-10-CM

## 2020-03-10 NOTE — Progress Notes (Addendum)
    Procedures performed today:    None.  Independent interpretation of notes and tests performed by another provider:   None.  Brief History, Exam, Impression, and Recommendations:    Primary osteoarthritis of left knee This is a pleasant 79 year old female, we have been seeing her for severe knee pain, she had a positive McMurray's sign, pain with terminal flexion, medial joint line pain after a misstep. Voltaren gel was ineffective so we injected her knee at the last visit, she returns today unfortunately with persistent pain. She is having catching and mechanical symptoms, for this reason I think she is now a candidate for an MRI and arthroscopy, MRI ordered, if we see a meniscal tear I will refer her to Dr. Luiz Blare.  Meniscal tearing, subchondral insufficiency fracture, referral to Dr. Luiz Blare.      ___________________________________________ [name]. Benjamin Stain, M.D., ABFM., CAQSM. Primary Care and Sports Medicine  [facility]  Adjunct Instructor of Family Medicine  [employer]

## 2020-03-10 NOTE — Assessment & Plan Note (Addendum)
This is a pleasant 79 year old female, we have been seeing her for severe knee pain, she had a positive McMurray's sign, pain with terminal flexion, medial joint line pain after a misstep. Voltaren gel was ineffective so we injected her knee at the last visit, she returns today unfortunately with persistent pain. She is having catching and mechanical symptoms, for this reason I think she is now a candidate for an MRI and arthroscopy, MRI ordered, if we see a meniscal tear I will refer her to Dr. Luiz Blare.  Meniscal tearing, subchondral insufficiency fracture, referral to Dr. Luiz Blare.

## 2020-03-11 ENCOUNTER — Other Ambulatory Visit: Payer: Self-pay | Admitting: *Deleted

## 2020-03-11 ENCOUNTER — Other Ambulatory Visit: Payer: Self-pay | Admitting: Family Medicine

## 2020-03-11 DIAGNOSIS — E038 Other specified hypothyroidism: Secondary | ICD-10-CM

## 2020-03-11 MED ORDER — LEVOTHYROXINE SODIUM 75 MCG PO TABS: 75.0000 ug | ORAL_TABLET | Freq: Every day | ORAL | 0 refills | Status: DC

## 2020-03-21 ENCOUNTER — Other Ambulatory Visit: Payer: Self-pay

## 2020-03-21 ENCOUNTER — Ambulatory Visit (INDEPENDENT_AMBULATORY_CARE_PROVIDER_SITE_OTHER): Payer: Medicare PPO

## 2020-03-21 DIAGNOSIS — S83242A Other tear of medial meniscus, current injury, left knee, initial encounter: Secondary | ICD-10-CM | POA: Diagnosis not present

## 2020-03-21 DIAGNOSIS — R600 Localized edema: Secondary | ICD-10-CM | POA: Diagnosis not present

## 2020-03-21 DIAGNOSIS — M7989 Other specified soft tissue disorders: Secondary | ICD-10-CM | POA: Diagnosis not present

## 2020-03-21 DIAGNOSIS — M25462 Effusion, left knee: Secondary | ICD-10-CM | POA: Diagnosis not present

## 2020-03-23 NOTE — Addendum Note (Signed)
Addended by: Monica Becton on: 03/23/2020 10:19 AM   Modules accepted: Orders

## 2020-03-30 ENCOUNTER — Encounter: Payer: Self-pay | Admitting: Family Medicine

## 2020-03-30 DIAGNOSIS — H18513 Endothelial corneal dystrophy, bilateral: Secondary | ICD-10-CM | POA: Diagnosis not present

## 2020-03-30 DIAGNOSIS — H52203 Unspecified astigmatism, bilateral: Secondary | ICD-10-CM | POA: Diagnosis not present

## 2020-03-30 DIAGNOSIS — H1045 Other chronic allergic conjunctivitis: Secondary | ICD-10-CM | POA: Diagnosis not present

## 2020-03-30 DIAGNOSIS — H43813 Vitreous degeneration, bilateral: Secondary | ICD-10-CM | POA: Diagnosis not present

## 2020-03-30 DIAGNOSIS — H401111 Primary open-angle glaucoma, right eye, mild stage: Secondary | ICD-10-CM | POA: Diagnosis not present

## 2020-03-30 DIAGNOSIS — H401122 Primary open-angle glaucoma, left eye, moderate stage: Secondary | ICD-10-CM | POA: Diagnosis not present

## 2020-03-30 DIAGNOSIS — H25813 Combined forms of age-related cataract, bilateral: Secondary | ICD-10-CM | POA: Diagnosis not present

## 2020-03-30 DIAGNOSIS — D3132 Benign neoplasm of left choroid: Secondary | ICD-10-CM | POA: Diagnosis not present

## 2020-03-30 DIAGNOSIS — H527 Unspecified disorder of refraction: Secondary | ICD-10-CM | POA: Diagnosis not present

## 2020-04-09 DIAGNOSIS — M25562 Pain in left knee: Secondary | ICD-10-CM | POA: Diagnosis not present

## 2020-04-09 DIAGNOSIS — S83242A Other tear of medial meniscus, current injury, left knee, initial encounter: Secondary | ICD-10-CM | POA: Diagnosis not present

## 2020-04-15 ENCOUNTER — Other Ambulatory Visit: Payer: Self-pay | Admitting: Family Medicine

## 2020-04-22 DIAGNOSIS — M94262 Chondromalacia, left knee: Secondary | ICD-10-CM | POA: Diagnosis not present

## 2020-04-22 DIAGNOSIS — M23222 Derangement of posterior horn of medial meniscus due to old tear or injury, left knee: Secondary | ICD-10-CM | POA: Diagnosis not present

## 2020-04-22 DIAGNOSIS — G8918 Other acute postprocedural pain: Secondary | ICD-10-CM | POA: Diagnosis not present

## 2020-04-22 DIAGNOSIS — M6752 Plica syndrome, left knee: Secondary | ICD-10-CM | POA: Diagnosis not present

## 2020-05-11 DIAGNOSIS — Z20822 Contact with and (suspected) exposure to covid-19: Secondary | ICD-10-CM | POA: Diagnosis not present

## 2020-05-18 ENCOUNTER — Telehealth: Payer: Self-pay

## 2020-05-18 DIAGNOSIS — M797 Fibromyalgia: Secondary | ICD-10-CM

## 2020-05-18 DIAGNOSIS — E039 Hypothyroidism, unspecified: Secondary | ICD-10-CM

## 2020-05-18 DIAGNOSIS — M25551 Pain in right hip: Secondary | ICD-10-CM

## 2020-05-18 DIAGNOSIS — E782 Mixed hyperlipidemia: Secondary | ICD-10-CM

## 2020-05-18 DIAGNOSIS — E038 Other specified hypothyroidism: Secondary | ICD-10-CM

## 2020-05-18 MED ORDER — DULOXETINE HCL 60 MG PO CPEP: 60.0000 mg | ORAL_CAPSULE | Freq: Every day | ORAL | 1 refills | Status: DC

## 2020-05-18 MED ORDER — ATORVASTATIN CALCIUM 10 MG PO TABS: 10.0000 mg | ORAL_TABLET | Freq: Every day | ORAL | 3 refills | Status: DC

## 2020-05-18 NOTE — Telephone Encounter (Signed)
[name] [name] requests refills. I have ordered labs.

## 2020-05-20 DIAGNOSIS — E782 Mixed hyperlipidemia: Secondary | ICD-10-CM | POA: Diagnosis not present

## 2020-05-20 DIAGNOSIS — E038 Other specified hypothyroidism: Secondary | ICD-10-CM | POA: Diagnosis not present

## 2020-05-20 DIAGNOSIS — E039 Hypothyroidism, unspecified: Secondary | ICD-10-CM | POA: Diagnosis not present

## 2020-05-21 DIAGNOSIS — H401111 Primary open-angle glaucoma, right eye, mild stage: Secondary | ICD-10-CM | POA: Diagnosis not present

## 2020-05-21 DIAGNOSIS — H52203 Unspecified astigmatism, bilateral: Secondary | ICD-10-CM | POA: Diagnosis not present

## 2020-05-21 DIAGNOSIS — H401122 Primary open-angle glaucoma, left eye, moderate stage: Secondary | ICD-10-CM | POA: Diagnosis not present

## 2020-05-21 DIAGNOSIS — H25813 Combined forms of age-related cataract, bilateral: Secondary | ICD-10-CM | POA: Diagnosis not present

## 2020-05-21 LAB — COMPLETE METABOLIC PANEL WITH GFR
AG Ratio: 1.4 (calc) (ref 1.0–2.5)
ALT: 13 U/L (ref 6–29)
AST: 17 U/L (ref 10–35)
Albumin: 4.1 g/dL (ref 3.6–5.1)
Alkaline phosphatase (APISO): 91 U/L (ref 37–153)
BUN: 21 mg/dL (ref 7–25)
CO2: 29 mmol/L (ref 20–32)
Calcium: 9.3 mg/dL (ref 8.6–10.4)
Chloride: 105 mmol/L (ref 98–110)
Creat: 0.73 mg/dL (ref 0.60–0.88)
GFR, Est African American: 90 mL/min/{1.73_m2} (ref >=60)
GFR, Est Non African American: 77 mL/min/{1.73_m2} (ref >=60)
Globulin: 2.9 g/dL (calc) (ref 1.9–3.7)
Glucose, Bld: 103 mg/dL — ABNORMAL HIGH (ref 65–99)
Potassium: 3.9 mmol/L (ref 3.5–5.3)
Sodium: 140 mmol/L (ref 135–146)
Total Bilirubin: 1 mg/dL (ref 0.2–1.2)
Total Protein: 7 g/dL (ref 6.1–8.1)

## 2020-05-21 LAB — LIPID PANEL W/REFLEX DIRECT LDL
Cholesterol: 151 mg/dL (ref <200)
HDL: 50 mg/dL (ref >=50)
LDL Cholesterol (Calc): 76 mg/dL (calc)
Non-HDL Cholesterol (Calc): 101 mg/dL (calc) (ref <130)
Total CHOL/HDL Ratio: 3 (calc) (ref <5.0)
Triglycerides: 145 mg/dL (ref <150)

## 2020-05-21 LAB — CBC WITH DIFFERENTIAL/PLATELET
Absolute Monocytes: 568 cells/uL (ref 200–950)
Basophils Absolute: 39 cells/uL (ref 0–200)
Basophils Relative: 0.9 %
Eosinophils Absolute: 159 cells/uL (ref 15–500)
Eosinophils Relative: 3.7 %
HCT: 38.6 % (ref 35.0–45.0)
Hemoglobin: 13.1 g/dL (ref 11.7–15.5)
Lymphs Abs: 1604 cells/uL (ref 850–3900)
MCH: 32.2 pg (ref 27.0–33.0)
MCHC: 33.9 g/dL (ref 32.0–36.0)
MCV: 94.8 fL (ref 80.0–100.0)
MPV: 12 fL (ref 7.5–12.5)
Monocytes Relative: 13.2 %
Neutro Abs: 1931 cells/uL (ref 1500–7800)
Neutrophils Relative %: 44.9 %
Platelets: 217 10*3/uL (ref 140–400)
RBC: 4.07 10*6/uL (ref 3.80–5.10)
RDW: 12.6 % (ref 11.0–15.0)
Total Lymphocyte: 37.3 %
WBC: 4.3 10*3/uL (ref 3.8–10.8)

## 2020-05-21 LAB — TSH: TSH: 1.74 mIU/L (ref 0.40–4.50)

## 2020-05-28 DIAGNOSIS — H25813 Combined forms of age-related cataract, bilateral: Secondary | ICD-10-CM | POA: Diagnosis not present

## 2020-05-28 DIAGNOSIS — H43813 Vitreous degeneration, bilateral: Secondary | ICD-10-CM | POA: Diagnosis not present

## 2020-05-28 DIAGNOSIS — J439 Emphysema, unspecified: Secondary | ICD-10-CM | POA: Diagnosis not present

## 2020-05-28 DIAGNOSIS — H401122 Primary open-angle glaucoma, left eye, moderate stage: Secondary | ICD-10-CM | POA: Diagnosis not present

## 2020-05-28 DIAGNOSIS — H401111 Primary open-angle glaucoma, right eye, mild stage: Secondary | ICD-10-CM | POA: Diagnosis not present

## 2020-05-28 DIAGNOSIS — K219 Gastro-esophageal reflux disease without esophagitis: Secondary | ICD-10-CM | POA: Diagnosis not present

## 2020-05-28 DIAGNOSIS — E039 Hypothyroidism, unspecified: Secondary | ICD-10-CM | POA: Diagnosis not present

## 2020-05-28 DIAGNOSIS — Z87891 Personal history of nicotine dependence: Secondary | ICD-10-CM | POA: Diagnosis not present

## 2020-05-28 DIAGNOSIS — H25812 Combined forms of age-related cataract, left eye: Secondary | ICD-10-CM | POA: Diagnosis not present

## 2020-05-28 DIAGNOSIS — E785 Hyperlipidemia, unspecified: Secondary | ICD-10-CM | POA: Diagnosis not present

## 2020-05-28 DIAGNOSIS — Z83511 Family history of glaucoma: Secondary | ICD-10-CM | POA: Diagnosis not present

## 2020-05-28 DIAGNOSIS — M154 Erosive (osteo)arthritis: Secondary | ICD-10-CM | POA: Diagnosis not present

## 2020-05-28 DIAGNOSIS — H401120 Primary open-angle glaucoma, left eye, stage unspecified: Secondary | ICD-10-CM | POA: Diagnosis not present

## 2020-05-28 DIAGNOSIS — H52203 Unspecified astigmatism, bilateral: Secondary | ICD-10-CM | POA: Diagnosis not present

## 2020-05-28 DIAGNOSIS — H18513 Endothelial corneal dystrophy, bilateral: Secondary | ICD-10-CM | POA: Diagnosis not present

## 2020-05-28 DIAGNOSIS — J449 Chronic obstructive pulmonary disease, unspecified: Secondary | ICD-10-CM | POA: Diagnosis not present

## 2020-06-05 ENCOUNTER — Encounter: Payer: Self-pay | Admitting: Family Medicine

## 2020-06-16 DIAGNOSIS — Z9842 Cataract extraction status, left eye: Secondary | ICD-10-CM | POA: Diagnosis not present

## 2020-06-16 DIAGNOSIS — H18513 Endothelial corneal dystrophy, bilateral: Secondary | ICD-10-CM | POA: Diagnosis not present

## 2020-06-16 DIAGNOSIS — G4730 Sleep apnea, unspecified: Secondary | ICD-10-CM | POA: Diagnosis not present

## 2020-06-16 DIAGNOSIS — H401121 Primary open-angle glaucoma, left eye, mild stage: Secondary | ICD-10-CM | POA: Diagnosis not present

## 2020-06-16 DIAGNOSIS — Z87891 Personal history of nicotine dependence: Secondary | ICD-10-CM | POA: Diagnosis not present

## 2020-06-16 DIAGNOSIS — H401122 Primary open-angle glaucoma, left eye, moderate stage: Secondary | ICD-10-CM | POA: Diagnosis not present

## 2020-06-16 DIAGNOSIS — J449 Chronic obstructive pulmonary disease, unspecified: Secondary | ICD-10-CM | POA: Diagnosis not present

## 2020-06-16 DIAGNOSIS — E039 Hypothyroidism, unspecified: Secondary | ICD-10-CM | POA: Diagnosis not present

## 2020-06-16 DIAGNOSIS — H401111 Primary open-angle glaucoma, right eye, mild stage: Secondary | ICD-10-CM | POA: Diagnosis not present

## 2020-06-16 DIAGNOSIS — H25811 Combined forms of age-related cataract, right eye: Secondary | ICD-10-CM | POA: Diagnosis not present

## 2020-06-16 DIAGNOSIS — H43813 Vitreous degeneration, bilateral: Secondary | ICD-10-CM | POA: Diagnosis not present

## 2020-06-16 DIAGNOSIS — H52203 Unspecified astigmatism, bilateral: Secondary | ICD-10-CM | POA: Diagnosis not present

## 2020-06-16 DIAGNOSIS — D3132 Benign neoplasm of left choroid: Secondary | ICD-10-CM | POA: Diagnosis not present

## 2020-06-16 DIAGNOSIS — K219 Gastro-esophageal reflux disease without esophagitis: Secondary | ICD-10-CM | POA: Diagnosis not present

## 2020-06-16 DIAGNOSIS — H527 Unspecified disorder of refraction: Secondary | ICD-10-CM | POA: Diagnosis not present

## 2020-07-21 ENCOUNTER — Other Ambulatory Visit: Payer: Self-pay

## 2020-07-21 MED ORDER — SPIRIVA RESPIMAT 2.5 MCG/ACT IN AERS: 2.0000 | INHALATION_SPRAY | Freq: Every day | RESPIRATORY_TRACT | 3 refills | Status: DC

## 2020-09-01 ENCOUNTER — Other Ambulatory Visit: Payer: Self-pay | Admitting: *Deleted

## 2020-09-01 DIAGNOSIS — E038 Other specified hypothyroidism: Secondary | ICD-10-CM

## 2020-09-01 DIAGNOSIS — N1831 Chronic kidney disease, stage 3a: Secondary | ICD-10-CM

## 2020-09-02 LAB — TSH: TSH: 1.05 mIU/L (ref 0.40–4.50)

## 2020-09-02 LAB — BASIC METABOLIC PANEL WITH GFR
BUN: 19 mg/dL (ref 7–25)
CO2: 29 mmol/L (ref 20–32)
Calcium: 9.9 mg/dL (ref 8.6–10.4)
Chloride: 104 mmol/L (ref 98–110)
Creat: 0.72 mg/dL (ref 0.60–0.88)
GFR, Est African American: 91 mL/min/{1.73_m2} (ref >=60)
GFR, Est Non African American: 79 mL/min/{1.73_m2} (ref >=60)
Glucose, Bld: 94 mg/dL (ref 65–99)
Potassium: 4.9 mmol/L (ref 3.5–5.3)
Sodium: 140 mmol/L (ref 135–146)

## 2020-09-02 NOTE — Progress Notes (Signed)
All labs are normal. 

## 2020-09-04 ENCOUNTER — Ambulatory Visit: Payer: Medicare PPO | Admitting: Family Medicine

## 2020-09-04 ENCOUNTER — Encounter: Payer: Self-pay | Admitting: Family Medicine

## 2020-09-04 ENCOUNTER — Other Ambulatory Visit: Payer: Self-pay

## 2020-09-04 VITALS — BP 130/63 | HR 84 | Ht 62.0 in | Wt 149.0 lb

## 2020-09-04 DIAGNOSIS — E038 Other specified hypothyroidism: Secondary | ICD-10-CM

## 2020-09-04 DIAGNOSIS — E039 Hypothyroidism, unspecified: Secondary | ICD-10-CM

## 2020-09-04 DIAGNOSIS — N1831 Chronic kidney disease, stage 3a: Secondary | ICD-10-CM | POA: Diagnosis not present

## 2020-09-04 DIAGNOSIS — J309 Allergic rhinitis, unspecified: Secondary | ICD-10-CM | POA: Diagnosis not present

## 2020-09-04 DIAGNOSIS — J432 Centrilobular emphysema: Secondary | ICD-10-CM

## 2020-09-04 MED ORDER — LEVOTHYROXINE SODIUM 75 MCG PO TABS: 75.0000 ug | ORAL_TABLET | Freq: Every day | ORAL | 0 refills | Status: DC

## 2020-09-04 NOTE — Assessment & Plan Note (Signed)
Taking over-the-counter loratadine and Atrovent nasal spray.

## 2020-09-04 NOTE — Assessment & Plan Note (Signed)
Stable on current regimen.  Plan to recheck 6 months.

## 2020-09-04 NOTE — Assessment & Plan Note (Signed)
Well-controlled.  Continue current regimen.  If she continues to lose weight then I would recommend that we recheck her TSH in about 4 to 6 months.

## 2020-09-04 NOTE — Assessment & Plan Note (Signed)
Stable.  No recent flares or exacerbations.  Continue with daily Spiriva

## 2020-09-04 NOTE — Progress Notes (Signed)
Established Patient Office Visit  Subjective:  Patient ID: [name] [name], female    DOB: August 12, 1940  Age: 80 y.o. MRN: [id number]  CC:  Chief Complaint  Patient presents with  . Hypothyroidism    HPI [name] [name] presents for Hypothyroidism - Taking medication regularly in the AM away from food and vitamins, etc. No recent change to skin, hair, or energy levels.  Currently taking 75 mcg daily.  She has some old 91 says she has been taking 1-1/2.  But last TSH looked fantastic.  Lab Results  Component Value Date   TSH 1.05 09/01/2020     F/U CKD 3 - no recent changes.   Lab Results  Component Value Date   CREATININE 0.72 09/01/2020   CREATININE 0.73 05/20/2020   CREATININE 0.88 03/06/2020     Follow-up emphysema - doing well. No recent flares. She has had some allergy sxs recently but has been taking her loratidine.  She uses her Spiriva daily.  She is having a lot of joint pain and would like to consider CBD oil.  She does get some relief with the Voltaren gel.   She says her daughter and granddaughter are now living with her and her daughter is doing most of the cooking she says she is actually been eating a little bit more consistently and has lost some weight since she was last here.  In fact she is down about 11 pounds.  She denies any unusual symptoms such as rash, night sweats, blood in the urine or stool or abnormal lumps.  He wanted to ask about the [place].   Past Medical History:  Diagnosis Date  . Anxiety    intermittent  . blood in stool   . COPD (chronic obstructive pulmonary disease) (HCC)   . Diverticulosis   . Fibromyalgia   . GERD (gastroesophageal reflux disease)   . Glaucoma   . Hemorrhoids   . HSV-2 (herpes simplex virus 2) infection 2009  . OSA on CPAP 07/27/06  . Osteoporosis   . Rectal bleeding    with hemorrhoids    Past Surgical History:  Procedure Laterality Date  . BUNIONECTOMY  [id number]  . CARPAL TUNNEL RELEASE Right  12/08/2016   Procedure: CARPAL TUNNEL RELEASE;  Surgeon: Cindee Salt, MD;  Location: [facility];  Service: Orthopedics;  Laterality: Right;  REG/FAB  . CERVICAL FUSION  2008  . hemorrhoids removed    . TRIGGER FINGER RELEASE  2008    Family History  Problem Relation Age of Onset  . Heart attack Father   . CAD Father   . Hyperlipidemia Mother   . Hypertension Mother   . CAD Mother     Social History   Socioeconomic History  . Marital status: Married    Spouse name: [name]  . Number of children: 3  . Years of education: 71  . Highest education level: Bachelor's degree (e.g., BA, AB, BS)  Occupational History  . Occupation: Armed forces operational officer    Comment: retired  Tobacco Use  . Smoking status: Former Smoker    Packs/day: 2.00    Years: 25.00    Pack years: 50.00    Quit date: 03/07/1981    Years since quitting: 39.5  . Smokeless tobacco: Never Used  Vaping Use  . Vaping Use: Never used  Substance and Sexual Activity  . Alcohol use: Yes    Alcohol/week: 0.0 standard drinks    Comment: 2 drinks per month socially  . Drug  use: No  . Sexual activity: Not Currently  Other Topics Concern  . Not on file  Social History Narrative   : former smoker, tobacco history last updated 10/28/2013. No smoking. No tobacco exposure, no alcohol. No caffeine. No recreational drug use. Trying to stay away from Gyms due to COVID. Is going look into silver sneakers program online.   Social Determinants of Health   Financial Resource Strain: Not on file  Food Insecurity: Not on file  Transportation Needs: Not on file  Physical Activity: Not on file  Stress: Not on file  Social Connections: Not on file  Intimate Partner Violence: Not on file    Outpatient Medications Prior to Visit  Medication Sig Dispense Refill  . albuterol (PROAIR HFA) 108 (90 Base) MCG/ACT inhaler INHALE 2 PUFFS INTO THE LUNGS EVERY 4 (FOUR) HOURS AS NEEDED FOR WHEEZING OR SHORTNESS OF BREATH. 18 g 2  .  aspirin 81 MG tablet Take 81 mg by mouth daily.    Marland Kitchen atorvastatin (LIPITOR) 10 MG tablet Take 1 tablet (10 mg total) by mouth daily. 90 tablet 3  . bifidobacterium infantis (ALIGN) capsule Take 1 capsule by mouth daily.    . Calcium-Phosphorus-Vitamin D (CALCIUM/D3 ADULT GUMMIES PO) Take 2 tablets daily by mouth. 2 gummies daily with meal    . Cholecalciferol (VITAMIN D PO) Take 3,000 mg by mouth daily.     . DENTAGEL 1.1 % GEL dental gel Apply 1.1 application topically daily.    . DULoxetine (CYMBALTA) 60 MG capsule Take 1 capsule (60 mg total) by mouth daily. 90 capsule 1  . hydrocortisone (ANUSOL-HC) 25 MG suppository Place 1 suppository (25 mg total) rectally 3 (three) times daily as needed for hemorrhoids or itching. (Patient taking differently: Place 25 mg rectally as needed for hemorrhoids. ) 24 suppository 11  . ipratropium (ATROVENT) 0.06 % nasal spray USE 2 SPRAYS IN BOTH  NOSTRILS EVERY 8 HOURS AS  NEEDED FOR RHINITIS 105 mL 1  . loratadine (CLARITIN) 10 MG tablet Take 10 mg by mouth daily.    . Menatetrenone (VITAMIN K2) 100 MCG TABS Take 1 tablet by mouth daily.    . Multiple Vitamin (MULTIVITAMINS PO) Take by mouth.    . polyethylene glycol (MIRALAX / GLYCOLAX) packet Take 17 g by mouth as needed.     . Timolol Maleate PF 0.5 % SOLN Place 1 drop into both eyes in the morning and at bedtime.    . Tiotropium Bromide Monohydrate (SPIRIVA RESPIMAT) 2.5 MCG/ACT AERS Inhale 2 puffs into the lungs daily. 12 g 3  . celecoxib (CELEBREX) 200 MG capsule One to 2 tablets by mouth daily as needed for pain. 60 capsule 2  . Latanoprost 0.005 % EMUL Place 1 drop into both eyes at bedtime.    Marland Kitchen levothyroxine (SYNTHROID) 75 MCG tablet Take 1 tablet (75 mcg total) by mouth daily before breakfast. 90 tablet 0   No facility-administered medications prior to visit.    Allergies  Allergen Reactions  . Alphagan P [Brimonidine Tartrate]     rash  . Netarsudil-Latanoprost Other (See Comments)     Causes eye irritation, eyelid swelling   . Codeine Rash  . Elemental Sulfur Rash  . Penicillins Rash    ROS Review of Systems    Objective:    Physical Exam Constitutional:      Appearance: She is well-developed.  HENT:     Head: Normocephalic and atraumatic.  Cardiovascular:     Rate and Rhythm: Normal rate  and regular rhythm.     Heart sounds: Normal heart sounds.  Pulmonary:     Effort: Pulmonary effort is normal.     Breath sounds: Normal breath sounds.  Skin:    General: Skin is warm and dry.  Neurological:     Mental Status: She is alert and oriented to person, place, and time.  Psychiatric:        Behavior: Behavior normal.     BP 130/63   Pulse 84   Ht 5\' 2"  (1.575 m)   Wt 149 lb (67.6 kg)   SpO2 97%   BMI 27.25 kg/m  Wt Readings from Last 3 Encounters:  09/04/20 149 lb (67.6 kg)  03/06/20 160 lb (72.6 kg)  01/27/20 162 lb (73.5 kg)     There are no preventive care reminders to display for this patient.  There are no preventive care reminders to display for this patient.  Lab Results  Component Value Date   TSH 1.05 09/01/2020   Lab Results  Component Value Date   WBC 4.3 05/20/2020   HGB 13.1 05/20/2020   HCT 38.6 05/20/2020   MCV 94.8 05/20/2020   PLT 217 05/20/2020   Lab Results  Component Value Date   NA 140 09/01/2020   K 4.9 09/01/2020   CO2 29 09/01/2020   GLUCOSE 94 09/01/2020   BUN 19 09/01/2020   CREATININE 0.72 09/01/2020   BILITOT 1.0 05/20/2020   ALKPHOS 77 04/25/2016   AST 17 05/20/2020   ALT 13 05/20/2020   PROT 7.0 05/20/2020   ALBUMIN 4.0 04/25/2016   CALCIUM 9.9 09/01/2020   Lab Results  Component Value Date   CHOL 151 05/20/2020   Lab Results  Component Value Date   HDL 50 05/20/2020   Lab Results  Component Value Date   LDLCALC 76 05/20/2020   Lab Results  Component Value Date   TRIG 145 05/20/2020   Lab Results  Component Value Date   CHOLHDL 3.0 05/20/2020   Lab Results  Component Value  Date   HGBA1C 5.6 04/26/2017      Assessment & Plan:   Problem List Items Addressed This Visit      Respiratory   Centrilobular emphysema (HCC)    Stable.  No recent flares or exacerbations.  Continue with daily Spiriva      Allergic rhinitis    Taking over-the-counter loratadine and Atrovent nasal spray.        Endocrine   Hypothyroid - Primary    Well-controlled.  Continue current regimen.  If she continues to lose weight then I would recommend that we recheck her TSH in about 4 to 6 months.      Relevant Medications   levothyroxine (SYNTHROID) 75 MCG tablet     Genitourinary   CKD (chronic kidney disease), stage III (HCC)    Stable on current regimen.  Plan to recheck 6 months.       Other Visit Diagnoses    Subclinical hypothyroidism       Relevant Medications   levothyroxine (SYNTHROID) 75 MCG tablet      Meds ordered this encounter  Medications  . levothyroxine (SYNTHROID) 75 MCG tablet    Sig: Take 1 tablet (75 mcg total) by mouth daily before breakfast.    Dispense:  90 tablet    Refill:  0    Pt will call when needed.    Follow-up: Return in about 6 months (around 03/06/2021) for thyroid and kidney check .  Beatrice Lecher, MD

## 2020-10-08 ENCOUNTER — Other Ambulatory Visit: Payer: Self-pay | Admitting: Family Medicine

## 2020-10-08 DIAGNOSIS — M797 Fibromyalgia: Secondary | ICD-10-CM

## 2020-10-08 DIAGNOSIS — M25551 Pain in right hip: Secondary | ICD-10-CM

## 2020-10-15 ENCOUNTER — Other Ambulatory Visit: Payer: Self-pay | Admitting: Family Medicine

## 2020-11-11 DIAGNOSIS — G4733 Obstructive sleep apnea (adult) (pediatric): Secondary | ICD-10-CM | POA: Diagnosis not present

## 2020-11-12 DIAGNOSIS — H401122 Primary open-angle glaucoma, left eye, moderate stage: Secondary | ICD-10-CM | POA: Diagnosis not present

## 2020-11-12 DIAGNOSIS — H401111 Primary open-angle glaucoma, right eye, mild stage: Secondary | ICD-10-CM | POA: Diagnosis not present

## 2020-11-12 DIAGNOSIS — Z961 Presence of intraocular lens: Secondary | ICD-10-CM | POA: Diagnosis not present

## 2020-11-12 DIAGNOSIS — H26499 Other secondary cataract, unspecified eye: Secondary | ICD-10-CM | POA: Diagnosis not present

## 2020-11-30 ENCOUNTER — Other Ambulatory Visit: Payer: Self-pay | Admitting: Family Medicine

## 2020-11-30 DIAGNOSIS — E038 Other specified hypothyroidism: Secondary | ICD-10-CM

## 2021-01-15 ENCOUNTER — Other Ambulatory Visit: Payer: Self-pay | Admitting: Family Medicine

## 2021-01-15 DIAGNOSIS — Z1231 Encounter for screening mammogram for malignant neoplasm of breast: Secondary | ICD-10-CM

## 2021-01-17 ENCOUNTER — Emergency Department
Admission: EM | Admit: 2021-01-17 | Discharge: 2021-01-17 | Disposition: A | Discharge status: Home / Self Care | Payer: Medicare PPO | Source: Home / Self Care

## 2021-01-17 ENCOUNTER — Other Ambulatory Visit: Payer: Self-pay

## 2021-01-17 DIAGNOSIS — Z2914 Encounter for prophylactic rabies immune globin: Secondary | ICD-10-CM | POA: Diagnosis not present

## 2021-01-17 DIAGNOSIS — Z880 Allergy status to penicillin: Secondary | ICD-10-CM | POA: Diagnosis not present

## 2021-01-17 DIAGNOSIS — Z203 Contact with and (suspected) exposure to rabies: Secondary | ICD-10-CM | POA: Diagnosis not present

## 2021-01-17 DIAGNOSIS — E079 Disorder of thyroid, unspecified: Secondary | ICD-10-CM | POA: Diagnosis not present

## 2021-01-17 DIAGNOSIS — G8911 Acute pain due to trauma: Secondary | ICD-10-CM | POA: Diagnosis not present

## 2021-01-17 DIAGNOSIS — M7989 Other specified soft tissue disorders: Secondary | ICD-10-CM | POA: Diagnosis not present

## 2021-01-17 DIAGNOSIS — Z87891 Personal history of nicotine dependence: Secondary | ICD-10-CM | POA: Diagnosis not present

## 2021-01-17 DIAGNOSIS — S61431A Puncture wound without foreign body of right hand, initial encounter: Secondary | ICD-10-CM | POA: Diagnosis not present

## 2021-01-17 DIAGNOSIS — Z885 Allergy status to narcotic agent status: Secondary | ICD-10-CM | POA: Diagnosis not present

## 2021-01-17 DIAGNOSIS — S61451A Open bite of right hand, initial encounter: Secondary | ICD-10-CM | POA: Diagnosis not present

## 2021-01-17 DIAGNOSIS — J449 Chronic obstructive pulmonary disease, unspecified: Secondary | ICD-10-CM | POA: Diagnosis not present

## 2021-01-17 DIAGNOSIS — Z23 Encounter for immunization: Secondary | ICD-10-CM | POA: Diagnosis not present

## 2021-01-20 DIAGNOSIS — Z23 Encounter for immunization: Secondary | ICD-10-CM | POA: Diagnosis not present

## 2021-01-20 DIAGNOSIS — Z2914 Encounter for prophylactic rabies immune globin: Secondary | ICD-10-CM | POA: Diagnosis not present

## 2021-01-20 DIAGNOSIS — Z203 Contact with and (suspected) exposure to rabies: Secondary | ICD-10-CM | POA: Diagnosis not present

## 2021-01-24 DIAGNOSIS — Z203 Contact with and (suspected) exposure to rabies: Secondary | ICD-10-CM | POA: Diagnosis not present

## 2021-01-24 DIAGNOSIS — Z2914 Encounter for prophylactic rabies immune globin: Secondary | ICD-10-CM | POA: Diagnosis not present

## 2021-01-24 DIAGNOSIS — Z23 Encounter for immunization: Secondary | ICD-10-CM | POA: Diagnosis not present

## 2021-01-31 DIAGNOSIS — Z203 Contact with and (suspected) exposure to rabies: Secondary | ICD-10-CM | POA: Diagnosis not present

## 2021-01-31 DIAGNOSIS — Z23 Encounter for immunization: Secondary | ICD-10-CM | POA: Diagnosis not present

## 2021-01-31 DIAGNOSIS — Z2914 Encounter for prophylactic rabies immune globin: Secondary | ICD-10-CM | POA: Diagnosis not present

## 2021-02-24 ENCOUNTER — Ambulatory Visit (INDEPENDENT_AMBULATORY_CARE_PROVIDER_SITE_OTHER): Payer: Medicare PPO

## 2021-02-24 ENCOUNTER — Other Ambulatory Visit: Payer: Self-pay

## 2021-02-24 DIAGNOSIS — Z1231 Encounter for screening mammogram for malignant neoplasm of breast: Secondary | ICD-10-CM

## 2021-02-25 ENCOUNTER — Other Ambulatory Visit: Payer: Self-pay | Admitting: Family Medicine

## 2021-02-25 DIAGNOSIS — E038 Other specified hypothyroidism: Secondary | ICD-10-CM

## 2021-03-01 NOTE — Progress Notes (Signed)
Please call patient. Normal mammogram.  Repeat in 1 year.  

## 2021-03-04 ENCOUNTER — Other Ambulatory Visit: Payer: Self-pay | Admitting: *Deleted

## 2021-03-04 DIAGNOSIS — E039 Hypothyroidism, unspecified: Secondary | ICD-10-CM

## 2021-03-05 DIAGNOSIS — E039 Hypothyroidism, unspecified: Secondary | ICD-10-CM | POA: Diagnosis not present

## 2021-03-06 LAB — TSH: TSH: 1.23 mIU/L (ref 0.40–4.50)

## 2021-03-08 ENCOUNTER — Ambulatory Visit: Payer: Medicare PPO | Admitting: Family Medicine

## 2021-03-09 ENCOUNTER — Encounter: Payer: Self-pay | Admitting: Family Medicine

## 2021-03-09 ENCOUNTER — Ambulatory Visit: Payer: Medicare PPO | Admitting: Family Medicine

## 2021-03-09 ENCOUNTER — Other Ambulatory Visit: Payer: Self-pay

## 2021-03-09 VITALS — BP 128/67 | HR 76 | Temp 98.2°F | Ht 62.0 in | Wt 143.0 lb

## 2021-03-09 DIAGNOSIS — E039 Hypothyroidism, unspecified: Secondary | ICD-10-CM

## 2021-03-09 DIAGNOSIS — M1712 Unilateral primary osteoarthritis, left knee: Secondary | ICD-10-CM | POA: Diagnosis not present

## 2021-03-09 DIAGNOSIS — F439 Reaction to severe stress, unspecified: Secondary | ICD-10-CM | POA: Diagnosis not present

## 2021-03-09 DIAGNOSIS — N1831 Chronic kidney disease, stage 3a: Secondary | ICD-10-CM | POA: Diagnosis not present

## 2021-03-09 NOTE — Progress Notes (Signed)
Established Patient Office Visit  Subjective:  Patient ID: [name] [name], female    DOB: 18-Mar-1941  Age: 80 y.o. MRN: [id number]  CC:  Chief Complaint  Patient presents with   Hypothyroidism   Chronic Kidney Disease    HPI BELLAROSE SIGNORE presents for   Hypothyroidism - Taking medication regularly in the AM away from food and vitamins, etc. No recent change to skin, hair, or energy levels.  F/U CKd 3 - no recent changes.   She recently separated from her husband of the last 12 years.  Unfortunately he was heavy alcohol user and she eventually just asked him to leave.  She has been helping to pay some of his bills recently.  Reports this has increased her stress levels but feels like she is dealing with it okay.  She still takes a baby aspirin each night for heart health.  Started a B12 supplement a couple of weeks ago.  Been using CBD cream on her neck and knees, and does feel like it helps.  She has a lot of arthritis and still has a lot of problems in particular with her right knee.  Past Medical History:  Diagnosis Date   Anxiety    intermittent   blood in stool    COPD (chronic obstructive pulmonary disease) (HCC)    Diverticulosis    Fibromyalgia    GERD (gastroesophageal reflux disease)    Glaucoma    Hemorrhoids    HSV-2 (herpes simplex virus 2) infection 2009   OSA on CPAP 07/27/06   Osteoporosis    Rectal bleeding    with hemorrhoids    Past Surgical History:  Procedure Laterality Date   BUNIONECTOMY  [id number]   CARPAL TUNNEL RELEASE Right 12/08/2016   Procedure: CARPAL TUNNEL RELEASE;  Surgeon: Cindee Salt, MD;  Location: [facility];  Service: Orthopedics;  Laterality: Right;  REG/FAB   CERVICAL FUSION  2008   hemorrhoids removed     TRIGGER FINGER RELEASE  2008    Family History  Problem Relation Age of Onset   Heart attack Father    CAD Father    Hyperlipidemia Mother    Hypertension Mother    CAD Mother      Social History   Socioeconomic History   Marital status: Married    Spouse name: [name]   Number of children: 3   Years of education: 18   Highest education level: Bachelor's degree (e.g., BA, AB, BS)  Occupational History   Occupation: Armed forces operational officer    Comment: retired  Tobacco Use   Smoking status: Former    Packs/day: 2.00    Years: 25.00    Pack years: 50.00    Types: Cigarettes    Quit date: 03/07/1981    Years since quitting: 40.0   Smokeless tobacco: Never  Vaping Use   Vaping Use: Never used  Substance and Sexual Activity   Alcohol use: Yes    Alcohol/week: 0.0 standard drinks    Comment: 2 drinks per month socially   Drug use: No   Sexual activity: Not Currently  Other Topics Concern   Not on file  Social History Narrative   : former smoker, tobacco history last updated 10/28/2013. No smoking. No tobacco exposure, no alcohol. No caffeine. No recreational drug use. Trying to stay away from Gyms due to COVID. Is going look into silver sneakers program online.   Social Determinants of Health   Financial Resource Strain: Not on file  Food Insecurity: Not on file  Transportation Needs: Not on file  Physical Activity: Not on file  Stress: Not on file  Social Connections: Not on file  Intimate Partner Violence: Not on file    Outpatient Medications Prior to Visit  Medication Sig Dispense Refill   albuterol (PROAIR HFA) 108 (90 Base) MCG/ACT inhaler INHALE 2 PUFFS INTO THE LUNGS EVERY 4 (FOUR) HOURS AS NEEDED FOR WHEEZING OR SHORTNESS OF BREATH. 18 g 2   atorvastatin (LIPITOR) 10 MG tablet Take 1 tablet (10 mg total) by mouth daily. 90 tablet 3   bifidobacterium infantis (ALIGN) capsule Take 1 capsule by mouth daily.     Calcium-Phosphorus-Vitamin D (CALCIUM/D3 ADULT GUMMIES PO) Take 2 tablets daily by mouth. 2 gummies daily with meal     Cholecalciferol (VITAMIN D PO) Take 3,000 mg by mouth daily.      DENTAGEL 1.1 % GEL dental gel Apply 1.1 application  topically daily.     DULoxetine (CYMBALTA) 60 MG capsule TAKE 1 CAPSULE EVERY DAY 90 capsule 1   hydrocortisone (ANUSOL-HC) 25 MG suppository Place 1 suppository (25 mg total) rectally 3 (three) times daily as needed for hemorrhoids or itching. (Patient taking differently: Place 25 mg rectally as needed for hemorrhoids.) 24 suppository 11   ipratropium (ATROVENT) 0.06 % nasal spray USE 2 SPRAYS IN BOTH  NOSTRILS EVERY 8 HOURS AS  NEEDED FOR RHINITIS 105 mL 1   levothyroxine (SYNTHROID) 75 MCG tablet TAKE 1 TABLET BY MOUTH EVERY DAY BEFORE BREAKFAST 90 tablet 0   loratadine (CLARITIN) 10 MG tablet Take 10 mg by mouth daily.     Menatetrenone (VITAMIN K2) 100 MCG TABS Take 1 tablet by mouth daily.     Multiple Vitamin (MULTIVITAMINS PO) Take by mouth.     polyethylene glycol (MIRALAX / GLYCOLAX) packet Take 17 g by mouth as needed.      Timolol Maleate PF 0.5 % SOLN Place 1 drop into both eyes in the morning and at bedtime.     Tiotropium Bromide Monohydrate (SPIRIVA RESPIMAT) 2.5 MCG/ACT AERS Inhale 2 puffs into the lungs daily. 12 g 3   aspirin 81 MG tablet Take 81 mg by mouth daily.     No facility-administered medications prior to visit.    Allergies  Allergen Reactions   Alphagan P [Brimonidine Tartrate]     rash   Netarsudil-Latanoprost Other (See Comments)    Causes eye irritation, eyelid swelling    Codeine Rash   Elemental Sulfur Rash   Penicillins Rash    ROS Review of Systems    Objective:    Physical Exam Constitutional:      Appearance: Normal appearance. She is well-developed.  HENT:     Head: Normocephalic and atraumatic.  Cardiovascular:     Rate and Rhythm: Normal rate and regular rhythm.     Heart sounds: Normal heart sounds.  Pulmonary:     Effort: Pulmonary effort is normal.     Breath sounds: Normal breath sounds.  Skin:    General: Skin is warm and dry.  Neurological:     Mental Status: She is alert and oriented to person, place, and time.   Psychiatric:        Behavior: Behavior normal.    BP 128/67   Pulse 76   Temp 98.2 F (36.8 C)   Ht 5\' 2"  (1.575 m)   Wt 143 lb (64.9 kg)   SpO2 96%   BMI 26.16 kg/m  Wt Readings from Last 3  Encounters:  03/09/21 143 lb (64.9 kg)  09/04/20 149 lb (67.6 kg)  03/06/20 160 lb (72.6 kg)     There are no preventive care reminders to display for this patient.   There are no preventive care reminders to display for this patient.  Lab Results  Component Value Date   TSH 1.23 03/05/2021   Lab Results  Component Value Date   WBC 4.3 05/20/2020   HGB 13.1 05/20/2020   HCT 38.6 05/20/2020   MCV 94.8 05/20/2020   PLT 217 05/20/2020   Lab Results  Component Value Date   NA 140 09/01/2020   K 4.9 09/01/2020   CO2 29 09/01/2020   GLUCOSE 94 09/01/2020   BUN 19 09/01/2020   CREATININE 0.72 09/01/2020   BILITOT 1.0 05/20/2020   ALKPHOS 77 04/25/2016   AST 17 05/20/2020   ALT 13 05/20/2020   PROT 7.0 05/20/2020   ALBUMIN 4.0 04/25/2016   CALCIUM 9.9 09/01/2020   Lab Results  Component Value Date   CHOL 151 05/20/2020   Lab Results  Component Value Date   HDL 50 05/20/2020   Lab Results  Component Value Date   LDLCALC 76 05/20/2020   Lab Results  Component Value Date   TRIG 145 05/20/2020   Lab Results  Component Value Date   CHOLHDL 3.0 05/20/2020   Lab Results  Component Value Date   HGBA1C 5.6 04/26/2017      Assessment & Plan:   Problem List Items Addressed This Visit       Endocrine   Hypothyroid - Primary    Recent TSH on October 28 looked great at 1.2 we will continue current regimen.        Musculoskeletal and Integument   Primary osteoarthritis of left knee    She does feel like she get some relief with the CBD cream which I think is okay to use as long as she is not having any significant side effects.  Also recommend a trial of turmeric for her arthritis in general.        Genitourinary   CKD (chronic kidney disease), stage III  (HCC)    Due to recheck renal function since it has been 6 months.      Other Visit Diagnoses     Stress at home           Okay to stop daily aspirin.  We discussed that the recommendations for primary pension have changed.  Mood wise I do think its been challenging with her separation though ultimately she feels like it is a good thing.  Did discuss that I am happy to refer her for therapy or counseling if at any point she feels like that would be helpful for her.  She says she will keep it in mind but is not interested currently.  No orders of the defined types were placed in this encounter.   Follow-up: Return in about 5 months (around 08/07/2021) for recheck kidneys and thyroid.    Nani Gasser, MD

## 2021-03-09 NOTE — Patient Instructions (Signed)
Okay to stop your aspirin.

## 2021-03-09 NOTE — Progress Notes (Signed)
Thyroid looks perfect at 1.2.

## 2021-03-10 NOTE — Assessment & Plan Note (Signed)
She does feel like she get some relief with the CBD cream which I think is okay to use as long as she is not having any significant side effects.  Also recommend a trial of turmeric for her arthritis in general.

## 2021-03-10 NOTE — Assessment & Plan Note (Signed)
Recent TSH on October 28 looked great at 1.2 we will continue current regimen.

## 2021-03-10 NOTE — Assessment & Plan Note (Signed)
Due to recheck renal function since it has been 6 months.

## 2021-03-22 ENCOUNTER — Other Ambulatory Visit: Payer: Self-pay | Admitting: Family Medicine

## 2021-03-22 DIAGNOSIS — M797 Fibromyalgia: Secondary | ICD-10-CM

## 2021-03-22 DIAGNOSIS — M25551 Pain in right hip: Secondary | ICD-10-CM

## 2021-05-01 ENCOUNTER — Other Ambulatory Visit: Payer: Self-pay | Admitting: Family Medicine

## 2021-05-01 DIAGNOSIS — E038 Other specified hypothyroidism: Secondary | ICD-10-CM

## 2021-05-03 ENCOUNTER — Other Ambulatory Visit: Payer: Self-pay | Admitting: Family Medicine

## 2021-05-20 DIAGNOSIS — H52203 Unspecified astigmatism, bilateral: Secondary | ICD-10-CM | POA: Diagnosis not present

## 2021-05-20 DIAGNOSIS — Z961 Presence of intraocular lens: Secondary | ICD-10-CM | POA: Diagnosis not present

## 2021-05-20 DIAGNOSIS — H1045 Other chronic allergic conjunctivitis: Secondary | ICD-10-CM | POA: Diagnosis not present

## 2021-05-20 DIAGNOSIS — H18513 Endothelial corneal dystrophy, bilateral: Secondary | ICD-10-CM | POA: Diagnosis not present

## 2021-05-20 DIAGNOSIS — H43813 Vitreous degeneration, bilateral: Secondary | ICD-10-CM | POA: Diagnosis not present

## 2021-05-20 DIAGNOSIS — H401122 Primary open-angle glaucoma, left eye, moderate stage: Secondary | ICD-10-CM | POA: Diagnosis not present

## 2021-05-20 DIAGNOSIS — H401111 Primary open-angle glaucoma, right eye, mild stage: Secondary | ICD-10-CM | POA: Diagnosis not present

## 2021-05-20 DIAGNOSIS — D3131 Benign neoplasm of right choroid: Secondary | ICD-10-CM | POA: Diagnosis not present

## 2021-05-20 DIAGNOSIS — H26493 Other secondary cataract, bilateral: Secondary | ICD-10-CM | POA: Diagnosis not present

## 2021-06-07 DIAGNOSIS — M79642 Pain in left hand: Secondary | ICD-10-CM | POA: Diagnosis not present

## 2021-06-07 DIAGNOSIS — M79641 Pain in right hand: Secondary | ICD-10-CM | POA: Diagnosis not present

## 2021-06-07 DIAGNOSIS — M19042 Primary osteoarthritis, left hand: Secondary | ICD-10-CM | POA: Diagnosis not present

## 2021-06-07 DIAGNOSIS — R2231 Localized swelling, mass and lump, right upper limb: Secondary | ICD-10-CM | POA: Diagnosis not present

## 2021-06-07 DIAGNOSIS — R200 Anesthesia of skin: Secondary | ICD-10-CM | POA: Diagnosis not present

## 2021-06-07 DIAGNOSIS — M19041 Primary osteoarthritis, right hand: Secondary | ICD-10-CM | POA: Diagnosis not present

## 2021-06-14 DIAGNOSIS — R2231 Localized swelling, mass and lump, right upper limb: Secondary | ICD-10-CM | POA: Diagnosis not present

## 2021-06-14 DIAGNOSIS — M79644 Pain in right finger(s): Secondary | ICD-10-CM | POA: Diagnosis not present

## 2021-06-23 ENCOUNTER — Other Ambulatory Visit: Payer: Self-pay | Admitting: Orthopedic Surgery

## 2021-06-23 DIAGNOSIS — R200 Anesthesia of skin: Secondary | ICD-10-CM | POA: Diagnosis not present

## 2021-06-23 DIAGNOSIS — R2231 Localized swelling, mass and lump, right upper limb: Secondary | ICD-10-CM | POA: Diagnosis not present

## 2021-06-23 DIAGNOSIS — M5412 Radiculopathy, cervical region: Secondary | ICD-10-CM | POA: Diagnosis not present

## 2021-06-23 DIAGNOSIS — G5602 Carpal tunnel syndrome, left upper limb: Secondary | ICD-10-CM | POA: Diagnosis not present

## 2021-06-24 ENCOUNTER — Other Ambulatory Visit: Payer: Self-pay

## 2021-06-24 ENCOUNTER — Encounter (HOSPITAL_BASED_OUTPATIENT_CLINIC_OR_DEPARTMENT_OTHER): Payer: Self-pay | Admitting: Orthopedic Surgery

## 2021-07-01 NOTE — Anesthesia Preprocedure Evaluation (Addendum)
Anesthesia Evaluation  Patient identified by MRN, date of birth, ID band Patient awake    Reviewed: Allergy & Precautions, Patient's Chart, lab work & pertinent test results  Airway Mallampati: II  TM Distance: >3 FB Neck ROM: Full    Dental  (+) Partial Upper   Pulmonary sleep apnea and Continuous Positive Airway Pressure Ventilation , COPD,  COPD inhaler, former smoker,  Quit smoking 1982, 50 pack year history    Pulmonary exam normal        Cardiovascular negative cardio ROS   Rhythm:Regular Rate:Normal  Stress test 2021 normal   Neuro/Psych PSYCHIATRIC DISORDERS Anxiety negative neurological ROS     GI/Hepatic Neg liver ROS, GERD  Controlled,  Endo/Other  Hypothyroidism   Renal/GU Renal InsufficiencyRenal diseaseCKD 3  negative genitourinary   Musculoskeletal  (+) Arthritis , Osteoarthritis,  Fibromyalgia -  Abdominal Normal abdominal exam  (+)   Peds  Hematology negative hematology ROS (+)   Anesthesia Other Findings   Reproductive/Obstetrics negative OB ROS                           Anesthesia Physical Anesthesia Plan  ASA: 2  Anesthesia Plan: MAC and Bier Block and Bier Block-LIDOCAINE ONLY   Post-op Pain Management: Tylenol PO (pre-op)*   Induction: Intravenous  PONV Risk Score and Plan: 2 and Propofol infusion and TIVA  Airway Management Planned: Natural Airway and Simple Face Mask  Additional Equipment: None  Intra-op Plan:   Post-operative Plan:   Informed Consent: I have reviewed the patients History and Physical, chart, labs and discussed the procedure including the risks, benefits and alternatives for the proposed anesthesia with the patient or authorized representative who has indicated his/her understanding and acceptance.     Dental advisory given  Plan Discussed with: CRNA  Anesthesia Plan Comments:        Anesthesia Quick Evaluation

## 2021-07-02 ENCOUNTER — Ambulatory Visit (HOSPITAL_BASED_OUTPATIENT_CLINIC_OR_DEPARTMENT_OTHER): Payer: Medicare PPO | Admitting: Anesthesiology

## 2021-07-02 ENCOUNTER — Ambulatory Visit (HOSPITAL_BASED_OUTPATIENT_CLINIC_OR_DEPARTMENT_OTHER)
Admission: RE | Admit: 2021-07-02 | Discharge: 2021-07-02 | Disposition: A | Discharge status: Home / Self Care | Payer: Medicare PPO | Attending: Orthopedic Surgery | Admitting: Orthopedic Surgery

## 2021-07-02 ENCOUNTER — Other Ambulatory Visit: Payer: Self-pay

## 2021-07-02 ENCOUNTER — Encounter (HOSPITAL_BASED_OUTPATIENT_CLINIC_OR_DEPARTMENT_OTHER)
Admission: RE | Disposition: A | Discharge status: Home / Self Care | Payer: Self-pay | Source: Home / Self Care | Attending: Orthopedic Surgery

## 2021-07-02 ENCOUNTER — Encounter (HOSPITAL_BASED_OUTPATIENT_CLINIC_OR_DEPARTMENT_OTHER): Payer: Self-pay | Admitting: Orthopedic Surgery

## 2021-07-02 DIAGNOSIS — M1990 Unspecified osteoarthritis, unspecified site: Secondary | ICD-10-CM | POA: Diagnosis not present

## 2021-07-02 DIAGNOSIS — F419 Anxiety disorder, unspecified: Secondary | ICD-10-CM

## 2021-07-02 DIAGNOSIS — K219 Gastro-esophageal reflux disease without esophagitis: Secondary | ICD-10-CM | POA: Insufficient documentation

## 2021-07-02 DIAGNOSIS — E039 Hypothyroidism, unspecified: Secondary | ICD-10-CM

## 2021-07-02 DIAGNOSIS — N183 Chronic kidney disease, stage 3 unspecified: Secondary | ICD-10-CM | POA: Diagnosis not present

## 2021-07-02 DIAGNOSIS — G4733 Obstructive sleep apnea (adult) (pediatric): Secondary | ICD-10-CM | POA: Insufficient documentation

## 2021-07-02 DIAGNOSIS — Z87891 Personal history of nicotine dependence: Secondary | ICD-10-CM | POA: Insufficient documentation

## 2021-07-02 DIAGNOSIS — J449 Chronic obstructive pulmonary disease, unspecified: Secondary | ICD-10-CM

## 2021-07-02 DIAGNOSIS — M797 Fibromyalgia: Secondary | ICD-10-CM | POA: Diagnosis not present

## 2021-07-02 DIAGNOSIS — Z9989 Dependence on other enabling machines and devices: Secondary | ICD-10-CM | POA: Diagnosis not present

## 2021-07-02 DIAGNOSIS — G5602 Carpal tunnel syndrome, left upper limb: Secondary | ICD-10-CM | POA: Insufficient documentation

## 2021-07-02 DIAGNOSIS — J439 Emphysema, unspecified: Secondary | ICD-10-CM | POA: Diagnosis not present

## 2021-07-02 HISTORY — DX: Hypothyroidism, unspecified: E03.9

## 2021-07-02 HISTORY — PX: CARPAL TUNNEL RELEASE: SHX101

## 2021-07-02 HISTORY — DX: Chronic kidney disease, stage 3 unspecified: N18.30

## 2021-07-02 HISTORY — DX: Emphysema, unspecified: J43.9

## 2021-07-02 SURGERY — CARPAL TUNNEL RELEASE
Anesthesia: Monitor Anesthesia Care | Site: Wrist | Laterality: Left

## 2021-07-02 MED ORDER — BUPIVACAINE HCL (PF) 0.25 % IJ SOLN
INTRAMUSCULAR | Status: DC | PRN
  Administered 2021-07-02: 9 mL

## 2021-07-02 MED ORDER — PROPOFOL 500 MG/50ML IV EMUL
INTRAVENOUS | Status: DC | PRN
  Administered 2021-07-02: 200 ug/kg/min via INTRAVENOUS

## 2021-07-02 MED ORDER — 0.9 % SODIUM CHLORIDE (POUR BTL) OPTIME
TOPICAL | Status: DC | PRN
  Administered 2021-07-02: 60 mL

## 2021-07-02 MED ORDER — ACETAMINOPHEN 500 MG PO TABS
1000.0000 mg | ORAL_TABLET | Freq: Once | ORAL | Status: AC
  Administered 2021-07-02: 1000 mg via ORAL

## 2021-07-02 MED ORDER — LIDOCAINE HCL (PF) 0.5 % IJ SOLN
INTRAMUSCULAR | Status: DC | PRN
  Administered 2021-07-02: 30 mL via INTRAVENOUS

## 2021-07-02 MED ORDER — ONDANSETRON HCL 4 MG/2ML IJ SOLN: 4.0000 mg | Freq: Once | INTRAMUSCULAR | Status: DC | PRN

## 2021-07-02 MED ORDER — LIDOCAINE HCL (CARDIAC) PF 100 MG/5ML IV SOSY
PREFILLED_SYRINGE | INTRAVENOUS | Status: DC | PRN
Start: 2021-07-02 — End: 2021-07-02
  Administered 2021-07-02: 40 mg via INTRAVENOUS

## 2021-07-02 MED ORDER — PROPOFOL 500 MG/50ML IV EMUL
INTRAVENOUS | Status: AC
  Filled 2021-07-02: qty 50

## 2021-07-02 MED ORDER — FENTANYL CITRATE (PF) 100 MCG/2ML IJ SOLN
INTRAMUSCULAR | Status: AC
  Filled 2021-07-02: qty 2

## 2021-07-02 MED ORDER — CLINDAMYCIN PHOSPHATE 900 MG/50ML IV SOLN
900.0000 mg | INTRAVENOUS | Status: AC
  Administered 2021-07-02: 900 mg via INTRAVENOUS

## 2021-07-02 MED ORDER — PROPOFOL 10 MG/ML IV BOLUS
INTRAVENOUS | Status: DC | PRN
  Administered 2021-07-02: 10 mg via INTRAVENOUS

## 2021-07-02 MED ORDER — CLINDAMYCIN PHOSPHATE 900 MG/50ML IV SOLN
INTRAVENOUS | Status: AC
  Filled 2021-07-02: qty 50

## 2021-07-02 MED ORDER — ACETAMINOPHEN 500 MG PO TABS
ORAL_TABLET | ORAL | Status: AC
  Filled 2021-07-02: qty 2

## 2021-07-02 MED ORDER — FENTANYL CITRATE (PF) 100 MCG/2ML IJ SOLN: 25.0000 ug | INTRAMUSCULAR | Status: DC | PRN

## 2021-07-02 MED ORDER — LACTATED RINGERS IV SOLN: INTRAVENOUS | Status: DC

## 2021-07-02 MED ORDER — FENTANYL CITRATE (PF) 100 MCG/2ML IJ SOLN
INTRAMUSCULAR | Status: DC | PRN
  Administered 2021-07-02 (×3): 25 ug via INTRAVENOUS

## 2021-07-02 MED ORDER — ONDANSETRON HCL 4 MG/2ML IJ SOLN
INTRAMUSCULAR | Status: DC | PRN
  Administered 2021-07-02: 4 mg via INTRAVENOUS

## 2021-07-02 MED ORDER — BUPIVACAINE HCL (PF) 0.25 % IJ SOLN
INTRAMUSCULAR | Status: AC
  Filled 2021-07-02: qty 30

## 2021-07-02 SURGICAL SUPPLY — 32 items
APL PRP STRL LF DISP 70% ISPRP (MISCELLANEOUS) ×1
BLADE SURG 15 STRL LF DISP TIS (BLADE) ×2 IMPLANT
BLADE SURG 15 STRL SS (BLADE) ×2
BNDG CMPR 5X3 CHSV STRCH STRL (GAUZE/BANDAGES/DRESSINGS) ×1
BNDG CMPR 9X4 STRL LF SNTH (GAUZE/BANDAGES/DRESSINGS) ×1
BNDG COHESIVE 3X5 TAN ST LF (GAUZE/BANDAGES/DRESSINGS) ×3 IMPLANT
BNDG ESMARK 4X9 LF (GAUZE/BANDAGES/DRESSINGS) ×1 IMPLANT
BNDG GAUZE ELAST 4 BULKY (GAUZE/BANDAGES/DRESSINGS) ×3 IMPLANT
CHLORAPREP W/TINT 26 (MISCELLANEOUS) ×3 IMPLANT
CORD BIPOLAR FORCEPS 12FT (ELECTRODE) ×3 IMPLANT
COVER BACK TABLE 60X90IN (DRAPES) ×3 IMPLANT
COVER MAYO STAND STRL (DRAPES) ×3 IMPLANT
DRAPE EXTREMITY T 121X128X90 (DISPOSABLE) ×3 IMPLANT
DRAPE U-SHAPE 47X51 STRL (DRAPES) ×1 IMPLANT
DRSG PAD ABDOMINAL 8X10 ST (GAUZE/BANDAGES/DRESSINGS) ×3 IMPLANT
GAUZE SPONGE 4X4 12PLY STRL (GAUZE/BANDAGES/DRESSINGS) ×3 IMPLANT
GAUZE XEROFORM 1X8 LF (GAUZE/BANDAGES/DRESSINGS) ×3 IMPLANT
GLOVE SURG ORTHO LTX SZ8 (GLOVE) ×3 IMPLANT
GLOVE SURG UNDER POLY LF SZ8.5 (GLOVE) ×3 IMPLANT
GOWN STRL REUS W/ TWL LRG LVL3 (GOWN DISPOSABLE) ×2 IMPLANT
GOWN STRL REUS W/TWL LRG LVL3 (GOWN DISPOSABLE) ×2
GOWN STRL REUS W/TWL XL LVL3 (GOWN DISPOSABLE) ×3 IMPLANT
NDL HYPO 27GX1-1/4 (NEEDLE) IMPLANT
NEEDLE HYPO 27GX1-1/4 (NEEDLE) ×2 IMPLANT
NS IRRIG 1000ML POUR BTL (IV SOLUTION) ×3 IMPLANT
PACK BASIN DAY SURGERY FS (CUSTOM PROCEDURE TRAY) ×3 IMPLANT
STOCKINETTE 4X48 STRL (DRAPES) ×3 IMPLANT
SUT ETHILON 4 0 PS 2 18 (SUTURE) ×3 IMPLANT
SYR BULB EAR ULCER 3OZ GRN STR (SYRINGE) ×3 IMPLANT
SYR CONTROL 10ML LL (SYRINGE) ×1 IMPLANT
TOWEL GREEN STERILE FF (TOWEL DISPOSABLE) ×3 IMPLANT
UNDERPAD 30X36 HEAVY ABSORB (UNDERPADS AND DIAPERS) ×3 IMPLANT

## 2021-07-02 NOTE — Transfer of Care (Signed)
2Immediate Anesthesia Transfer of Care Note  Patient: [name] [name]  Procedure(s) Performed: CARPAL TUNNEL RELEASE LEFT (Left: Wrist)  Patient Location: PACU  Anesthesia Type:MAC and Bier block  Level of Consciousness: drowsy  Airway & Oxygen Therapy: Patient Spontanous Breathing and Patient connected to face mask oxygen  Post-op Assessment: Report given to RN and Post -op Vital signs reviewed and stable  Post vital signs: Reviewed and stable  Last Vitals:  Vitals Value Taken Time  BP 121/66 07/02/21 1310  Temp    Pulse 85 07/02/21 1311  Resp 14 07/02/21 1311  SpO2 97 % 07/02/21 1311  Vitals shown include unvalidated device data.  Last Pain:  Vitals:   07/02/21 1145  TempSrc: Oral  PainSc: 0-No pain      Patients Stated Pain Goal: 3 (01/58/68 2574)  Complications: No notable events documented.

## 2021-07-02 NOTE — Anesthesia Postprocedure Evaluation (Signed)
Anesthesia Post Note  Patient: [name] [name]  Procedure(s) Performed: CARPAL TUNNEL RELEASE LEFT (Left: Wrist)     Patient location during evaluation: PACU Anesthesia Type: MAC and Bier Block Level of consciousness: awake and alert Pain management: pain level controlled Vital Signs Assessment: post-procedure vital signs reviewed and stable Respiratory status: spontaneous breathing, nonlabored ventilation, respiratory function stable and patient connected to nasal cannula oxygen Cardiovascular status: stable and blood pressure returned to baseline Postop Assessment: no apparent nausea or vomiting Anesthetic complications: no   No notable events documented.  Last Vitals:  Vitals:   07/02/21 1330 07/02/21 1358  BP: 124/63 (!) 146/82  Pulse: 80 85  Resp: 16 16  Temp:  36.5 C  SpO2: 99% 97%    Last Pain:  Vitals:   07/02/21 1358  TempSrc:   PainSc: 0-No pain                 Belenda Cruise P Esdras Delair

## 2021-07-02 NOTE — Brief Op Note (Signed)
07/02/2021  1:07 PM  PATIENT:  [name] [name]  81 y.o. female  PRE-OPERATIVE DIAGNOSIS:  LEFT CARPAL TUNNEL SYNDROME  POST-OPERATIVE DIAGNOSIS:  LEFT CARPAL TUNNEL SYNDROME  PROCEDURE:  Procedure(s) with comments: CARPAL TUNNEL RELEASE LEFT (Left) - 45 MIN  SURGEON:  Surgeon(s) and Role:    * Cindee Salt, MD - Primary  PHYSICIAN ASSISTANT:   ASSISTANTS: none   ANESTHESIA:   local, regional, and IV sedation  EBL:  1 mL   BLOOD ADMINISTERED:none  DRAINS: none   LOCAL MEDICATIONS USED:  BUPIVICAINE   SPECIMEN:  No Specimen  DISPOSITION OF SPECIMEN:  N/A  COUNTS:  YES  TOURNIQUET:   Total Tourniquet Time Documented: Forearm (Left) - 18 minutes Total: Forearm (Left) - 18 minutes   DICTATION: .Dragon Dictation  PLAN OF CARE: Discharge to home after PACU  PATIENT DISPOSITION:  PACU - hemodynamically stable.

## 2021-07-02 NOTE — Discharge Instructions (Addendum)
Hand Center Instructions Hand Surgery  Wound Care: Keep your hand elevated above the level of your heart.  Do not allow it to dangle by your side.  Keep the dressing dry and do not remove it unless your doctor advises you to do so.  He will usually change it at the time of your post-op visit.  Moving your fingers is advised to stimulate circulation but will depend on the site of your surgery.  If you have a splint applied, your doctor will advise you regarding movement.  Activity: Do not drive or operate machinery today.  Rest today and then you may return to your normal activity and work as indicated by your physician.  Diet:  Drink liquids today or eat a light diet.  You may resume a regular diet tomorrow.    General expectations: Pain for two to three days. Fingers may become slightly swollen.  Call your doctor if any of the following occur: Severe pain not relieved by pain medication. Elevated temperature. Dressing soaked with blood. Inability to move fingers. White or bluish color to fingers.   Post Anesthesia Home Care Instructions  Activity: Get plenty of rest for the remainder of the day. A responsible individual must stay with you for 24 hours following the procedure.  For the next 24 hours, DO NOT: -Drive a car -Operate machinery -Drink alcoholic beverages -Take any medication unless instructed by your physician -Make any legal decisions or sign important papers.  Meals: Start with liquid foods such as gelatin or soup. Progress to regular foods as tolerated. Avoid greasy, spicy, heavy foods. If nausea and/or vomiting occur, drink only clear liquids until the nausea and/or vomiting subsides. Call your physician if vomiting continues.  Special Instructions/Symptoms: Your throat may feel dry or sore from the anesthesia or the breathing tube placed in your throat during surgery. If this causes discomfort, gargle with warm salt water. The discomfort should disappear within  24 hours.  If you had a scopolamine patch placed behind your ear for the management of post- operative nausea and/or vomiting:  1. The medication in the patch is effective for 72 hours, after which it should be removed.  Wrap patch in a tissue and discard in the trash. Wash hands thoroughly with soap and water. 2. You may remove the patch earlier than 72 hours if you experience unpleasant side effects which may include dry mouth, dizziness or visual disturbances. 3. Avoid touching the patch. Wash your hands with soap and water after contact with the patch.     No tylenol until after 6pm if needed today. 

## 2021-07-02 NOTE — Op Note (Signed)
NAME: Ezri Fanguy [facility] MEDICAL RECORD NO: [id number] DATE OF BIRTH: [birth year] FACILITY: [facility] LOCATION: [facility] PHYSICIAN: Nicki Reaper, MD   OPERATIVE REPORT   DATE OF PROCEDURE: 07/02/21    PREOPERATIVE DIAGNOSIS: Carpal tunnel syndrome left hand   POSTOPERATIVE DIAGNOSIS: Same   PROCEDURE: Decompression median nerve left   SURGEON: Cindee Salt, M.D.   ASSISTANT: none   ANESTHESIA:  Bier block with sedation and Local   INTRAVENOUS FLUIDS:  Per anesthesia flow sheet.   ESTIMATED BLOOD LOSS:  Minimal.   COMPLICATIONS:  None.   SPECIMENS:  none   TOURNIQUET TIME:    Total Tourniquet Time Documented: Forearm (Left) - 18 minutes Total: Forearm (Left) - 18 minutes    DISPOSITION:  Stable to PACU.   INDICATIONS: Patient is an 81 year old female with a history of numbness and tingling bilateral hands.  She has undergone release of the carpal canal on her right side is admitted now for release to the left.  Nerve conductions are positive.  She is aware there is no guarantee to the surgery the possibility of infection recurrence injury to arteries nerves tendons complete relief symptoms and dystrophy.  Preoperative area the patient seen the extremity was marked by both patient and surgeon antibiotic  OPERATIVE COURSE: Patient is brought to the operating room placed in a supine position with the left arm free.  A forearm tourniquet was applied along with a forearm IV regional under the direction of the anesthesia department.  Prep was done with ChloraPrep.  A 3-minute dry time was allowed and timeout taken confirm patient procedure.  A longitudinal incision was made left palm carried down through subcutaneous tissue.  Bleeders electrocauterized with bipolar.  The palmar fascia was split.  Superficial palmar arch along with the flexor tendons to the ring and small fingers were identified.  A sect C retractor was then placed retracting median nerve flexor  tendons radially ulnar nerve ulnarly.  The flexor retinaculum was then released on its ulnar border.  The second C retractor and a sewell retractor were then placed between skin and forearm fascia.  Deep structures were dissected free.  Blunt scissors were then used to release the proximal aspect of the flexor retinaculum distal forearm fascia for approximately 2 cm proximal to the wrist crease under direct vision.  The canal was explored.  Nerve compression of the nerve is apparent.  Motor branch and muscle distally.  The wound was copiously irrigated with saline.  The skin was closed interrupted 4-0 nylon sutures.  Local infiltration with quarter percent bupivacaine without epinephrine was given 9 cc was used.  A sterile compressive dressing with fingers 3 was applied.  Deflation of the tourniquet all fingers immediately pink.  She was taken to the recovery room for observation in satisfactory condition.  She will be discharged home to return to the hand center [facility] in 1 week on Tylenol for pain.  She has pain medicine at home.   Cindee Salt, MD Electronically signed, 07/02/21

## 2021-07-02 NOTE — Anesthesia Procedure Notes (Signed)
Anesthesia Regional Block: Bier block (IV Regional)   Pre-Anesthetic Checklist: , timeout performed,  Correct Patient, Correct Site, Correct Laterality,  Correct Procedure, Correct Position, site marked,  Risks and benefits discussed,  Surgical consent,  Pre-op evaluation,  At surgeon's request and post-op pain management  Laterality: Left  Prep: alcohol swabs       Needles:  Injection technique: Single-shot      Additional Needles:   Procedures:,,,,, intact distal pulses, Esmarch exsanguination,  Single tourniquet utilized    Narrative:  Start time: 07/02/2021 12:43 PM End time: 07/02/2021 12:44 PM  Performed by: Noel Gerold

## 2021-07-02 NOTE — H&P (Signed)
[name] [name] is an 81 y.o. female.   Chief Complaint: Numbness left hand HPI: [name] [name] is an 81 yo female complaining of numbness tingling specially left side. She is post carpal tunnel release on the right side. She was sent for nerve conductions and an ultrasound. The she has a mass on the dorsal aspect of the right hand at the PIP joint level revealing a cystic mass 4 x 2 x 3 mm without any definite communication to the joint space. This is read out by Dr. Oleta Mouse she does show changes on the nerve conduction with a motor delay of 4.2 on the left 4.0 on the right a sensory delay of 3.3 bilaterally. Both nerves are enlarged right to 11 left to 10 this was done by Dr. Tamsen Roers. He continues to complain of the numbness and tingling especially on her left side. She is not complaining of significant discomfort on her right ring finger. She has a history of thyroid problems no history of diabetes or gout she does have arthritis. Family history is positive for diabetes. Past Medical History:  Diagnosis Date   Anxiety    intermittent   blood in stool    CKD (chronic kidney disease), stage III (HCC)    COPD (chronic obstructive pulmonary disease) (HCC)    Diverticulosis    Emphysema lung (HCC)    Fibromyalgia    GERD (gastroesophageal reflux disease)    Glaucoma    Hemorrhoids    HSV-2 (herpes simplex virus 2) infection 2009   OSA on CPAP 07/27/2006   Osteoporosis    Rectal bleeding    with hemorrhoids    Past Surgical History:  Procedure Laterality Date   BUNIONECTOMY  [id number]   CARPAL TUNNEL RELEASE Right 12/08/2016   Procedure: CARPAL TUNNEL RELEASE;  Surgeon: Daryll Brod, MD;  Location: [place];  Service: Orthopedics;  Laterality: Right;  REG/FAB   CERVICAL FUSION  2008   hemorrhoids removed     TRIGGER FINGER RELEASE  2008    Family History  Problem Relation Age of Onset   Heart attack Father    CAD Father    Hyperlipidemia Mother    Hypertension Mother     CAD Mother    Social History:  reports that she quit smoking about 40 years ago. Her smoking use included cigarettes. She has a 50.00 pack-year smoking history. She has never used smokeless tobacco. She reports current alcohol use. She reports that she does not use drugs.  Allergies:  Allergies  Allergen Reactions   Alphagan P [Brimonidine Tartrate]     rash   Netarsudil-Latanoprost Other (See Comments)    Causes eye irritation, eyelid swelling    Codeine Rash   Elemental Sulfur Rash   Penicillins Rash    No medications prior to admission.    No results found for this or any previous visit (from the past 48 hour(s)).  No results found.   Pertinent items are noted in HPI.  Height 5\' 2"  (1.575 m), weight 62.6 kg.  General appearance: alert, cooperative, and appears stated age Head: Normocephalic, without obvious abnormality Neck: no JVD Resp: clear to auscultation bilaterally Cardio: regular rate and rhythm, S1, S2 normal, no murmur, click, rub or gallop GI: soft, non-tender; bowel sounds normal; no masses,  no organomegaly Extremities: Numbness left hand Pulses: 2+ and symmetric Skin: Skin color, texture, turgor normal. No rashes or lesions Neurologic: Grossly normal Incision/Wound: na  Assessment/Plan Diagnosis carpal tunnel syndrome left  Plan: She would  like to proceed to have a left carpal tunnel release. Prepare postoperative course are discussed along with risk and complications. She is aware that there is no guarantee to the surgery the possibility of an infection recurrence injury to arteries nerves tendons complete relief symptoms and dystrophy. She is scheduled for left carpal tunnel release in outpatient under regional anesthesia.  Daryll Brod 07/02/2021, 5:13 AM

## 2021-07-05 ENCOUNTER — Encounter (HOSPITAL_BASED_OUTPATIENT_CLINIC_OR_DEPARTMENT_OTHER): Payer: Self-pay | Admitting: Orthopedic Surgery

## 2021-08-02 ENCOUNTER — Other Ambulatory Visit: Payer: Self-pay | Admitting: Family Medicine

## 2021-08-13 ENCOUNTER — Other Ambulatory Visit: Payer: Self-pay | Admitting: Family Medicine

## 2021-08-13 DIAGNOSIS — E038 Other specified hypothyroidism: Secondary | ICD-10-CM

## 2021-08-16 ENCOUNTER — Encounter: Payer: Self-pay | Admitting: Family Medicine

## 2021-08-16 ENCOUNTER — Ambulatory Visit (INDEPENDENT_AMBULATORY_CARE_PROVIDER_SITE_OTHER): Payer: Medicare PPO | Admitting: Family Medicine

## 2021-08-16 VITALS — BP 124/72 | HR 69 | Ht 62.0 in | Wt 142.0 lb

## 2021-08-16 DIAGNOSIS — Z0000 Encounter for general adult medical examination without abnormal findings: Secondary | ICD-10-CM

## 2021-08-16 DIAGNOSIS — M8580 Other specified disorders of bone density and structure, unspecified site: Secondary | ICD-10-CM

## 2021-08-16 DIAGNOSIS — E038 Other specified hypothyroidism: Secondary | ICD-10-CM | POA: Diagnosis not present

## 2021-08-16 DIAGNOSIS — N1831 Chronic kidney disease, stage 3a: Secondary | ICD-10-CM | POA: Diagnosis not present

## 2021-08-16 NOTE — Progress Notes (Signed)
Subjective:  ?  ? [name] [name] is a 81 y.o. female and is here for a comprehensive physical exam. The patient reports no problems.  She is doing well overall no specific concerns.  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  And she does a lot of knitting.  She has a little dog but she gets outside with but does not exercise routinely.  She feels like her thyroid is fairly well regulated she has been using her nasal spray recently for nasal allergies.  She wears her CPAP regularly for her obstructive sleep apnea.  She does have her follow-up dermatology appointment tomorrow.  She did get the updated new by Valent COVID-vaccine and flu vaccine this past fall. ? ?Social History  ? ?Socioeconomic History  ? Marital status: Married  ?  Spouse name: [name]  ? Number of children: 3  ? Years of education: 60  ? Highest education level: Bachelor's degree (e.g., BA, AB, BS)  ?Occupational History  ? Occupation: Armed forces operational officer  ?  Comment: retired  ?Tobacco Use  ? Smoking status: Former  ?  Packs/day: 2.00  ?  Years: 25.00  ?  Pack years: 50.00  ?  Types: Cigarettes  ?  Quit date: 03/07/1981  ?  Years since quitting: 40.4  ? Smokeless tobacco: Never  ?Vaping Use  ? Vaping Use: Never used  ?Substance and Sexual Activity  ? Alcohol use: Yes  ?  Alcohol/week: 0.0 standard drinks  ?  Comment: 2 drinks per month socially  ? Drug use: No  ? Sexual activity: Not Currently  ?Other Topics Concern  ? Not on file  ?Social History Narrative  ? : former smoker, tobacco history last updated 10/28/2013. No smoking. No tobacco exposure, no alcohol. No caffeine. No recreational drug use. Trying to stay away from Gyms due to COVID. Is going look into silver sneakers program online.  ? ?Social Determinants of Health  ? ?Financial Resource Strain: Not on file  ?Food Insecurity: Not on file  ?Transportation Needs: Not on file  ?Physical Activity: Not on file  ?Stress: Not on file  ?Social Connections: Not on file  ?Intimate  Partner Violence: Not on file  ? ?Health Maintenance  ?Topic Date Due  ? COVID-19 Vaccine (5 - Booster for Pfizer series) 04/14/2021  ? INFLUENZA VACCINE  12/07/2021  ? TETANUS/TDAP  08/16/2027  ? Pneumonia Vaccine 42+ Years old  Completed  ? DEXA SCAN  Completed  ? Zoster Vaccines- Shingrix  Completed  ? HPV VACCINES  Aged Out  ? ? ?The following portions of the patient's history were reviewed and updated as appropriate: allergies, current medications, past family history, past medical history, past social history, past surgical history, and problem list. ? ?Review of Systems ?Pertinent items are noted in HPI.  ? ?Objective:  ? ? BP 134/68   Pulse 80   Ht 5\' 2"  (1.575 m)   Wt 142 lb (64.4 kg)   SpO2 99%   BMI 25.97 kg/m?  ?General appearance: alert, cooperative, and appears stated age ?Head: Normocephalic, without obvious abnormality, atraumatic ?Eyes:  conj clear, EOMI, PEERLA ?Ears: normal TM's and external ear canals both ears ?Nose: Nares normal. Septum midline. Mucosa normal. No drainage or sinus tenderness. ?Throat: lips, mucosa, and tongue normal; teeth and gums normal ?Neck: no adenopathy, no carotid bruit, no JVD, supple, symmetrical, trachea midline, and thyroid not enlarged, symmetric, no tenderness/mass/nodules ?Back: symmetric, no curvature. ROM normal. No CVA tenderness. ?Lungs: clear to auscultation  bilaterally ?Heart: regular rate and rhythm, S1, S2 normal, no murmur, click, rub or gallop ?Abdomen: soft, non-tender; bowel sounds normal; no masses,  no organomegaly ?Extremities: extremities normal, atraumatic, no cyanosis or edema ?Pulses: 2+ and symmetric ?Skin: Skin color, texture, turgor normal. No rashes or lesions ?Lymph nodes: Cervical adenopathy:   ?Neurologic: Grossly normal  ?  ?Assessment:  ? ? Healthy female exam.    ?  ?Plan:  ? ?  ?See After Visit Summary for Counseling Recommendations  ?Keep up a regular exercise program and make sure you are eating a healthy diet ?Try to eat 4  servings of dairy a day, or if you are lactose intolerant take a calcium with vitamin D daily.  ?Your vaccines are up to date.  ?Due for updated DEXA.  Has had flu and COVID-vaccine. ?Get updated labs today as well. ?Has dermatology appointment this week as well as dental appointment. ? ?Orders Placed This Encounter  ?Procedures  ? DG Bone Density  ?  Standing Status:   Future  ?  Standing Expiration Date:   08/17/2022  ?  Order Specific Question:   Reason for Exam (SYMPTOM  OR DIAGNOSIS REQUIRED)  ?  Answer:   post meno  ?  Order Specific Question:   Preferred imaging location?  ?  Answer:   [facility]  ? Lipid panel  ? COMPLETE METABOLIC PANEL WITH GFR  ? Vitamin D (25 hydroxy)  ? CBC with Differential  ? TSH  ? ? ? ?

## 2021-08-17 ENCOUNTER — Other Ambulatory Visit: Payer: Self-pay | Admitting: Family Medicine

## 2021-08-17 DIAGNOSIS — L578 Other skin changes due to chronic exposure to nonionizing radiation: Secondary | ICD-10-CM | POA: Diagnosis not present

## 2021-08-17 DIAGNOSIS — E038 Other specified hypothyroidism: Secondary | ICD-10-CM

## 2021-08-17 DIAGNOSIS — D225 Melanocytic nevi of trunk: Secondary | ICD-10-CM | POA: Diagnosis not present

## 2021-08-17 DIAGNOSIS — N1831 Chronic kidney disease, stage 3a: Secondary | ICD-10-CM | POA: Diagnosis not present

## 2021-08-17 DIAGNOSIS — L570 Actinic keratosis: Secondary | ICD-10-CM | POA: Diagnosis not present

## 2021-08-17 DIAGNOSIS — D2261 Melanocytic nevi of right upper limb, including shoulder: Secondary | ICD-10-CM | POA: Diagnosis not present

## 2021-08-17 DIAGNOSIS — M8580 Other specified disorders of bone density and structure, unspecified site: Secondary | ICD-10-CM | POA: Diagnosis not present

## 2021-08-17 DIAGNOSIS — L821 Other seborrheic keratosis: Secondary | ICD-10-CM | POA: Diagnosis not present

## 2021-08-17 DIAGNOSIS — L408 Other psoriasis: Secondary | ICD-10-CM | POA: Diagnosis not present

## 2021-08-17 DIAGNOSIS — L7211 Pilar cyst: Secondary | ICD-10-CM | POA: Diagnosis not present

## 2021-08-18 ENCOUNTER — Other Ambulatory Visit: Payer: Self-pay | Admitting: *Deleted

## 2021-08-18 DIAGNOSIS — M797 Fibromyalgia: Secondary | ICD-10-CM

## 2021-08-18 DIAGNOSIS — M65342 Trigger finger, left ring finger: Secondary | ICD-10-CM | POA: Diagnosis not present

## 2021-08-18 DIAGNOSIS — G5602 Carpal tunnel syndrome, left upper limb: Secondary | ICD-10-CM | POA: Diagnosis not present

## 2021-08-18 DIAGNOSIS — M25551 Pain in right hip: Secondary | ICD-10-CM

## 2021-08-18 LAB — CBC WITH DIFFERENTIAL/PLATELET
Absolute Monocytes: 503 cells/uL (ref 200–950)
Basophils Absolute: 30 cells/uL (ref 0–200)
Basophils Relative: 0.7 %
Eosinophils Absolute: 99 cells/uL (ref 15–500)
Eosinophils Relative: 2.3 %
HCT: 39.5 % (ref 35.0–45.0)
Hemoglobin: 13.4 g/dL (ref 11.7–15.5)
Lymphs Abs: 1462 cells/uL (ref 850–3900)
MCH: 31.8 pg (ref 27.0–33.0)
MCHC: 33.9 g/dL (ref 32.0–36.0)
MCV: 93.6 fL (ref 80.0–100.0)
MPV: 11.8 fL (ref 7.5–12.5)
Monocytes Relative: 11.7 %
Neutro Abs: 2206 cells/uL (ref 1500–7800)
Neutrophils Relative %: 51.3 %
Platelets: 225 10*3/uL (ref 140–400)
RBC: 4.22 10*6/uL (ref 3.80–5.10)
RDW: 12.5 % (ref 11.0–15.0)
Total Lymphocyte: 34 %
WBC: 4.3 10*3/uL (ref 3.8–10.8)

## 2021-08-18 LAB — COMPLETE METABOLIC PANEL WITH GFR
AG Ratio: 1 (calc) (ref 1.0–2.5)
ALT: 19 U/L (ref 6–29)
AST: 21 U/L (ref 10–35)
Albumin: 3.9 g/dL (ref 3.6–5.1)
Alkaline phosphatase (APISO): 96 U/L (ref 37–153)
BUN: 15 mg/dL (ref 7–25)
CO2: 23 mmol/L (ref 20–32)
Calcium: 9.7 mg/dL (ref 8.6–10.4)
Chloride: 107 mmol/L (ref 98–110)
Creat: 0.74 mg/dL (ref 0.60–0.95)
Globulin: 3.8 g/dL (calc) — ABNORMAL HIGH (ref 1.9–3.7)
Glucose, Bld: 104 mg/dL — ABNORMAL HIGH (ref 65–99)
Potassium: 4.1 mmol/L (ref 3.5–5.3)
Sodium: 144 mmol/L (ref 135–146)
Total Bilirubin: 1.1 mg/dL (ref 0.2–1.2)
Total Protein: 7.7 g/dL (ref 6.1–8.1)
eGFR: 81 mL/min/{1.73_m2} (ref >=60)

## 2021-08-18 LAB — LIPID PANEL
Cholesterol: 154 mg/dL (ref <200)
HDL: 76 mg/dL (ref >=50)
LDL Cholesterol (Calc): 60 mg/dL (calc)
Non-HDL Cholesterol (Calc): 78 mg/dL (calc) (ref <130)
Total CHOL/HDL Ratio: 2 (calc) (ref <5.0)
Triglycerides: 94 mg/dL (ref <150)

## 2021-08-18 LAB — TSH: TSH: 1.98 mIU/L (ref 0.40–4.50)

## 2021-08-18 LAB — VITAMIN D 25 HYDROXY (VIT D DEFICIENCY, FRACTURES): Vit D, 25-Hydroxy: 68 ng/mL (ref 30–100)

## 2021-08-18 MED ORDER — DULOXETINE HCL 60 MG PO CPEP: ORAL_CAPSULE | ORAL | 1 refills | Status: DC

## 2021-08-19 NOTE — Progress Notes (Signed)
HI Devanne,  ?Your metabolic panel overall looks good.  Blood count is normal no sign of anemia.  Cholesterol looks fantastic.  Vitamin D and thyroid level look great.

## 2021-09-13 DIAGNOSIS — G4733 Obstructive sleep apnea (adult) (pediatric): Secondary | ICD-10-CM | POA: Diagnosis not present

## 2021-09-15 ENCOUNTER — Ambulatory Visit (INDEPENDENT_AMBULATORY_CARE_PROVIDER_SITE_OTHER): Payer: Medicare PPO

## 2021-09-15 DIAGNOSIS — M8588 Other specified disorders of bone density and structure, other site: Secondary | ICD-10-CM | POA: Diagnosis not present

## 2021-09-15 DIAGNOSIS — M8589 Other specified disorders of bone density and structure, multiple sites: Secondary | ICD-10-CM | POA: Diagnosis not present

## 2021-09-15 DIAGNOSIS — Z780 Asymptomatic menopausal state: Secondary | ICD-10-CM | POA: Diagnosis not present

## 2021-09-15 DIAGNOSIS — M8580 Other specified disorders of bone density and structure, unspecified site: Secondary | ICD-10-CM

## 2021-09-15 NOTE — Progress Notes (Signed)
Hi [name] [name], your bone density shows a T score -2.2.  This puts you into the mildly thin bone category called osteopenia.  T score actually pretty stable over the last 5 years. ? ? ?The current recommendation for osteopenia (mildly thin bones) treatment includes:  ? ?#1 calcium-total of 1200 mg of calcium daily.  If you eat a very calcium rich diet you may be able to obtain that without a supplement.  If not, then I recommend calcium 500 mg twice a day.  There are several products over-the-counter such as Caltrate D and Viactiv chews which are great options that contain calcium and vitamin D. ?#2 vitamin D-recommend 800 international units daily. ?#3 exercise-recommend 30 minutes of weightbearing exercise 3 days a week.  Resistance training ,such as doing bands and light weights, can be particularly helpful. ? ?

## 2021-10-22 ENCOUNTER — Telehealth: Payer: Medicare PPO | Admitting: Physician Assistant

## 2021-10-22 ENCOUNTER — Telehealth: Payer: Self-pay | Admitting: *Deleted

## 2021-10-22 DIAGNOSIS — U071 COVID-19: Secondary | ICD-10-CM

## 2021-10-22 MED ORDER — MOLNUPIRAVIR EUA 200MG CAPSULE: 4.0000 | ORAL_CAPSULE | Freq: Two times a day (BID) | ORAL | 0 refills | Status: AC

## 2021-10-22 MED ORDER — PREDNISONE 20 MG PO TABS: 40.0000 mg | ORAL_TABLET | Freq: Every day | ORAL | 0 refills | Status: DC

## 2021-10-22 NOTE — Telephone Encounter (Signed)
Pt called and stated that she tested positive + for COVID. She said that she had been having some problems with bronchitis.  Pt advised to do an E-visit for this. She voiced understanding and agreed.

## 2021-10-22 NOTE — Patient Instructions (Signed)
[name], thank you for joining Margaretann Loveless, PA-C for today's virtual visit.  While this provider is not your primary care provider (PCP), if your PCP is located in our provider database this encounter information will be shared with them immediately following your visit.  Consent: (Patient) [name] [name] provided verbal consent for this virtual visit at the beginning of the encounter.  Current Medications:  Current Outpatient Medications:    molnupiravir EUA (LAGEVRIO) 200 mg CAPS capsule, Take 4 capsules (800 mg total) by mouth 2 (two) times daily for 5 days., Disp: 40 capsule, Rfl: 0   predniSONE (DELTASONE) 20 MG tablet, Take 2 tablets (40 mg total) by mouth daily with breakfast., Disp: 10 tablet, Rfl: 0   albuterol (PROAIR HFA) 108 (90 Base) MCG/ACT inhaler, INHALE 2 PUFFS INTO THE LUNGS EVERY 4 (FOUR) HOURS AS NEEDED FOR WHEEZING OR SHORTNESS OF BREATH., Disp: 18 g, Rfl: 2   atorvastatin (LIPITOR) 10 MG tablet, TAKE 1 TABLET BY MOUTH EVERY DAY, Disp: 90 tablet, Rfl: 3   bifidobacterium infantis (ALIGN) capsule, Take 1 capsule by mouth daily., Disp: , Rfl:    Calcium-Phosphorus-Vitamin D (CALCIUM/D3 ADULT GUMMIES PO), Take 2 tablets daily by mouth. 2 gummies daily with meal, Disp: , Rfl:    Cholecalciferol (VITAMIN D PO), Take 3,000 mg by mouth daily. , Disp: , Rfl:    DENTAGEL 1.1 % GEL dental gel, Apply 1.1 application topically daily., Disp: , Rfl:    DULoxetine (CYMBALTA) 60 MG capsule, TAKE 1 CAPSULE BY MOUTH EVERY DAY, Disp: 90 capsule, Rfl: 1   hydrocortisone (ANUSOL-HC) 25 MG suppository, Place 1 suppository (25 mg total) rectally 3 (three) times daily as needed for hemorrhoids or itching. (Patient taking differently: Place 25 mg rectally as needed for hemorrhoids.), Disp: 24 suppository, Rfl: 11   ipratropium (ATROVENT) 0.06 % nasal spray, USE 2 SPRAYS IN BOTH  NOSTRILS EVERY 8 HOURS AS  NEEDED FOR RHINITIS, Disp: 105 mL, Rfl: 1   levothyroxine  (SYNTHROID) 75 MCG tablet, TAKE 1 TABLET BY MOUTH EVERY DAY BEFORE BREAKFAST, Disp: 90 tablet, Rfl: 0   loratadine (CLARITIN) 10 MG tablet, Take 10 mg by mouth daily., Disp: , Rfl:    Menatetrenone (VITAMIN K2) 100 MCG TABS, Take 1 tablet by mouth daily., Disp: , Rfl:    Multiple Vitamin (MULTIVITAMINS PO), Take by mouth., Disp: , Rfl:    polyethylene glycol (MIRALAX / GLYCOLAX) packet, Take 17 g by mouth as needed. , Disp: , Rfl:    SPIRIVA RESPIMAT 2.5 MCG/ACT AERS, INHALE 2 PUFFS BY MOUTH INTO THE LUNGS DAILY, Disp: 12 g, Rfl: 3   Timolol Maleate PF 0.5 % SOLN, Place 1 drop into both eyes in the morning and at bedtime., Disp: , Rfl:    vitamin B-12 (CYANOCOBALAMIN) 100 MCG tablet, Take 100 mcg by mouth daily., Disp: , Rfl:    Medications ordered in this encounter:  Meds ordered this encounter  Medications   molnupiravir EUA (LAGEVRIO) 200 mg CAPS capsule    Sig: Take 4 capsules (800 mg total) by mouth 2 (two) times daily for 5 days.    Dispense:  40 capsule    Refill:  0    Order Specific Question:   Supervising Provider    Answer:   MILLER, BRIAN [[id number]]   predniSONE (DELTASONE) 20 MG tablet    Sig: Take 2 tablets (40 mg total) by mouth daily with breakfast.    Dispense:  10 tablet    Refill:  0  Order Specific Question:   Supervising Provider    Answer:   Eber Hong [[id number]]     *If you need refills on other medications prior to your next appointment, please contact your pharmacy*  Follow-Up: Call back or seek an in-person evaluation if the symptoms worsen or if the condition fails to improve as anticipated.  Other Instructions 10 Things You Can Do to Manage Your COVID-19 Symptoms at Home If you have possible or confirmed COVID-19 Stay home except to get medical care. Monitor your symptoms carefully. If your symptoms get worse, call your healthcare provider immediately. Get rest and stay hydrated. If you have a medical appointment, call the healthcare provider ahead of  time and tell them that you have or may have COVID-19. For medical emergencies, call 911 and notify the dispatch personnel that you have or may have COVID-19. Cover your cough and sneezes with a tissue or use the inside of your elbow. Wash your hands often with soap and water for at least 20 seconds or clean your hands with an alcohol-based hand sanitizer that contains at least 60% alcohol. As much as possible, stay in a specific room and away from other people in your home. Also, you should use a separate bathroom, if available. If you need to be around other people in or outside of the home, wear a mask. Avoid sharing personal items with other people in your household, like dishes, towels, and bedding. Clean all surfaces that are touched often, like counters, tabletops, and doorknobs. Use household cleaning sprays or wipes according to the label instructions. [email or web address] 11/22/2019 This information is not intended to replace advice given to you by your health care provider. Make sure you discuss any questions you have with your health care provider. Document Revised: 01/15/2021 Document Reviewed: 01/15/2021 Elsevier Patient Education  2022 [employer].    If you have been instructed to have an in-person evaluation today at a local Urgent Care facility, please use the link below. It will take you to a list of all of our available [facility], including address, phone number and hours of operation. Please do not delay care.  [facility]  If you or a family member do not have a primary care provider, use the link below to schedule a visit and establish care. When you choose a [facility] primary care physician or advanced practice provider, you gain a long-term partner in health. Find a Primary Care Provider  Learn more about [facility]'s in-office and virtual care options: [facility] - Get Care Now

## 2021-10-22 NOTE — Progress Notes (Signed)
Virtual Visit Consent   [name] [name], you are scheduled for a virtual visit with a [facility] provider today. Just as with appointments in the office, your consent must be obtained to participate. Your consent will be active for this visit and any virtual visit you may have with one of our providers in the next 365 days. If you have a MyChart account, a copy of this consent can be sent to you electronically.  As this is a virtual visit, video technology does not allow for your provider to perform a traditional examination. This may limit your provider's ability to fully assess your condition. If your provider identifies any concerns that need to be evaluated in person or the need to arrange testing (such as labs, EKG, etc.), we will make arrangements to do so. Although advances in technology are sophisticated, we cannot ensure that it will always work on either your end or our end. If the connection with a video visit is poor, the visit may have to be switched to a telephone visit. With either a video or telephone visit, we are not always able to ensure that we have a secure connection.  By engaging in this virtual visit, you consent to the provision of healthcare and authorize for your insurance to be billed (if applicable) for the services provided during this visit. Depending on your insurance coverage, you may receive a charge related to this service.  I need to obtain your verbal consent now. Are you willing to proceed with your visit today? [name] [name] has provided verbal consent on 10/22/2021 for a virtual visit (video or telephone). [name] Loveless, PA-C  Date: 10/22/2021 3:26 PM  Virtual Visit via Video Note   I, [name] [name], connected with  [name] [name]  ([id number], [birth year]) on 10/22/21 at  3:15 PM EDT by a video-enabled telemedicine application and verified that I am speaking with the correct person using two identifiers.  Location: Patient: Virtual  Visit Location Patient: Home Provider: Virtual Visit Location Provider: Home Office   I discussed the limitations of evaluation and management by telemedicine and the availability of in person appointments. The patient expressed understanding and agreed to proceed.    History of Present Illness: [name] [name] is a 81 y.o. who identifies as a female who was assigned female at birth, and is being seen today for Covid 50.  HPI: URI  This is a new problem. Episode onset: tested positive for Covid 19 today; symptoms started Monday. The problem has been gradually improving. Associated symptoms include congestion, coughing and a sore throat. Pertinent negatives include no rhinorrhea.      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   Trigger ring finger of right hand 08/12/2016   Genital herpes 02/16/2016   Trochanteric bursitis of right hip 07/27/2015   ASCVD (arteriosclerotic cardiovascular disease) 02/05/2015   Rectal bleeding 02/05/2015   Pulmonary nodules 02/05/2015   Glaucoma 01/06/2015   Fibromyalgia 01/06/2015   Elevated cholesterol with elevated triglycerides 01/06/2015  Obstructive sleep apnea on CPAP 01/06/2015   Osteoporosis 01/06/2015    Allergies:  Allergies  Allergen Reactions   Alphagan P [Brimonidine Tartrate]     rash   Netarsudil-Latanoprost Other (See Comments)    Causes eye irritation, eyelid swelling    Codeine Rash   Elemental Sulfur Rash   Penicillins Rash    Medications:  Current Outpatient Medications:    molnupiravir EUA (LAGEVRIO) 200 mg CAPS capsule, Take 4 capsules (800 mg total) by mouth 2 (two) times daily for 5 days., Disp: 40 capsule, Rfl: 0   predniSONE (DELTASONE) 20 MG tablet, Take 2 tablets (40 mg total) by mouth daily with breakfast., Disp: 10 tablet, Rfl: 0   albuterol (PROAIR HFA) 108 (90 Base) MCG/ACT inhaler, INHALE 2 PUFFS INTO THE LUNGS EVERY 4 (FOUR) HOURS AS NEEDED FOR WHEEZING OR SHORTNESS OF BREATH., Disp: 18 g, Rfl: 2   atorvastatin (LIPITOR) 10 MG tablet, TAKE 1 TABLET BY MOUTH EVERY DAY, Disp: 90 tablet, Rfl: 3   bifidobacterium infantis (ALIGN) capsule, Take 1 capsule by mouth daily., Disp: , Rfl:    Calcium-Phosphorus-Vitamin D (CALCIUM/D3 ADULT GUMMIES PO), Take 2 tablets daily by mouth. 2 gummies daily with meal, Disp: , Rfl:    Cholecalciferol (VITAMIN D PO), Take 3,000 mg by mouth daily. , Disp: , Rfl:    DENTAGEL 1.1 % GEL dental gel, Apply 1.1 application topically daily., Disp: , Rfl:    DULoxetine (CYMBALTA) 60 MG capsule, TAKE 1 CAPSULE BY MOUTH EVERY DAY, Disp: 90 capsule, Rfl: 1   hydrocortisone (ANUSOL-HC) 25 MG suppository, Place 1 suppository (25 mg total) rectally 3 (three) times daily as needed for hemorrhoids or itching. (Patient taking differently: Place 25 mg rectally as needed for hemorrhoids.), Disp: 24 suppository, Rfl: 11   ipratropium (ATROVENT) 0.06 % nasal spray, USE 2 SPRAYS IN BOTH  NOSTRILS EVERY 8 HOURS AS  NEEDED FOR RHINITIS, Disp: 105 mL, Rfl: 1   levothyroxine (SYNTHROID) 75 MCG tablet, TAKE 1 TABLET BY MOUTH EVERY DAY BEFORE BREAKFAST, Disp: 90 tablet, Rfl: 0   loratadine (CLARITIN) 10 MG tablet, Take 10 mg by mouth daily., Disp: , Rfl:    Menatetrenone (VITAMIN K2) 100 MCG TABS, Take 1 tablet by mouth daily., Disp: , Rfl:    Multiple Vitamin (MULTIVITAMINS PO), Take by mouth., Disp: , Rfl:    polyethylene glycol (MIRALAX / GLYCOLAX) packet, Take 17 g by mouth as needed. , Disp: , Rfl:     SPIRIVA RESPIMAT 2.5 MCG/ACT AERS, INHALE 2 PUFFS BY MOUTH INTO THE LUNGS DAILY, Disp: 12 g, Rfl: 3   Timolol Maleate PF 0.5 % SOLN, Place 1 drop into both eyes in the morning and at bedtime., Disp: , Rfl:    vitamin B-12 (CYANOCOBALAMIN) 100 MCG tablet, Take 100 mcg by mouth daily., Disp: , Rfl:   Observations/Objective: Patient is well-developed, well-nourished in no acute distress.  Resting comfortably at home.  Head is normocephalic, atraumatic.  No labored breathing.  Speech is clear and coherent with logical content.  Patient is alert and oriented at baseline.    Assessment and Plan: 1. COVID-19 - molnupiravir EUA (LAGEVRIO) 200 mg CAPS capsule; Take 4 capsules (800 mg total) by mouth 2 (two) times daily for 5 days.  Dispense: 40 capsule; Refill: 0 - predniSONE (DELTASONE) 20 MG tablet; Take 2 tablets (40 mg total) by mouth daily with breakfast.  Dispense: 10 tablet; Refill: 0 - MyChart COVID-19 home monitoring program; Future  - Continue OTC symptomatic management of choice -  Will send OTC vitamins and supplement information through AVS - Molnupiravir and Prednisone prescribed - Patient enrolled in MyChart symptom monitoring - Push fluids - Rest as needed - Discussed return precautions and when to seek in-person evaluation, sent via AVS as well   Follow Up Instructions: I discussed the assessment and treatment plan with the patient. The patient was provided an opportunity to ask questions and all were answered. The patient agreed with the plan and demonstrated an understanding of the instructions.  A copy of instructions were sent to the patient via MyChart unless otherwise noted below.    The patient was advised to call back or seek an in-person evaluation if the symptoms worsen or if the condition fails to improve as anticipated.  Time:  I spent 12 minutes with the patient via telehealth technology discussing the above problems/concerns.    [name] Loveless,  PA-C

## 2021-11-02 ENCOUNTER — Other Ambulatory Visit: Payer: Self-pay | Admitting: Family Medicine

## 2021-11-02 DIAGNOSIS — E038 Other specified hypothyroidism: Secondary | ICD-10-CM

## 2021-11-16 DIAGNOSIS — G4733 Obstructive sleep apnea (adult) (pediatric): Secondary | ICD-10-CM | POA: Diagnosis not present

## 2021-12-16 DIAGNOSIS — H401111 Primary open-angle glaucoma, right eye, mild stage: Secondary | ICD-10-CM | POA: Diagnosis not present

## 2021-12-29 ENCOUNTER — Encounter: Payer: Self-pay | Admitting: General Practice

## 2022-01-14 ENCOUNTER — Other Ambulatory Visit: Payer: Self-pay | Admitting: Family Medicine

## 2022-01-14 DIAGNOSIS — Z1231 Encounter for screening mammogram for malignant neoplasm of breast: Secondary | ICD-10-CM

## 2022-01-19 ENCOUNTER — Other Ambulatory Visit: Payer: Self-pay | Admitting: Family Medicine

## 2022-01-19 DIAGNOSIS — E038 Other specified hypothyroidism: Secondary | ICD-10-CM

## 2022-02-04 ENCOUNTER — Ambulatory Visit (INDEPENDENT_AMBULATORY_CARE_PROVIDER_SITE_OTHER): Payer: Medicare PPO | Admitting: Physician Assistant

## 2022-02-04 DIAGNOSIS — Z0000 Encounter for general adult medical examination without abnormal findings: Secondary | ICD-10-CM | POA: Diagnosis not present

## 2022-02-04 NOTE — Progress Notes (Signed)
MEDICARE ANNUAL WELLNESS VISIT  02/04/2022  Telephone Visit Disclaimer This Medicare AWV was conducted by telephone due to national recommendations for restrictions regarding the COVID-19 Pandemic (e.g. social distancing).  I verified, using two identifiers, that I am speaking with [name] [name] or their authorized healthcare agent. I discussed the limitations, risks, security, and privacy concerns of performing an evaluation and management service by telephone and the potential availability of an in-person appointment in the future. The patient expressed understanding and agreed to proceed.  Location of Patient: Home Location of Provider (nurse):  In the office.  Subjective:    [name] [name] is a 81 y.o. female patient of Metheney, Rene Kocher, MD who had a Medicare Annual Wellness Visit today via telephone. [name] [name] is Retired and lives alone. she has 3 children. she reports that she is socially active and does interact with friends/family regularly. she is minimally physically active and enjoys going to the church.  Patient Care Team: Hali Marry, MD as PCP - General (Family Medicine) Collene Gobble, MD as Consulting Physician (Pulmonary Disease) Dorothe Pea, MD as Consulting Physician (Otolaryngology) Marius Ditch, MD as Attending Physician (Sleep Medicine) Leonia Corona, MD as Referring Physician (Ophthalmology)     02/04/2022   11:32 AM 07/02/2021   11:47 AM 05/06/2019   10:11 AM 04/30/2018    8:22 AM 12/08/2016    7:45 AM 12/01/2016    4:02 PM 08/05/2015   11:28 AM  Advanced Directives  Does Patient Have a Medical Advance Directive? Yes Yes Yes Yes Yes Yes Yes  Type of Advance Directive Living will Living will [place];Living will [place];Living will Living will    Does patient want to make changes to medical advance directive? No - Patient declined No - Patient declined No - Patient declined No - Patient  declined No - Patient declined    Copy of [place] in Chart?   No - copy requested No - copy requested No - copy requested No - copy requested   Would patient like information on creating a medical advance directive?  No - Patient declined         Hospital Utilization Over the Past 12 Months: # of hospitalizations or ER visits: 0 # of surgeries: 0  Review of Systems    Patient reports that her overall health is unchanged compared to last year.  History obtained from chart review and the patient  Patient Reported Readings (BP, Pulse, CBG, Weight, etc) none  Pain Assessment Pain : No/denies pain     Current Medications & Allergies (verified) Allergies as of 02/04/2022       Reactions   Alphagan P [brimonidine Tartrate]    rash   Netarsudil-latanoprost Other (See Comments)   Causes eye irritation, eyelid swelling    Codeine Rash   Elemental Sulfur Rash   Penicillins Rash        Medication List        Accurate as of February 04, 2022 11:41 AM. If you have any questions, ask your nurse or doctor.          albuterol 108 (90 Base) MCG/ACT inhaler Commonly known as: ProAir HFA INHALE 2 PUFFS INTO THE LUNGS EVERY 4 (FOUR) HOURS AS NEEDED FOR WHEEZING OR SHORTNESS OF BREATH.   atorvastatin 10 MG tablet Commonly known as: LIPITOR TAKE 1 TABLET BY MOUTH EVERY DAY   bifidobacterium infantis capsule Take 1 capsule by mouth daily.  CALCIUM/D3 ADULT GUMMIES PO Take 2 tablets daily by mouth. 2 gummies daily with meal   DentaGel 1.1 % Gel dental gel Generic drug: sodium fluoride Apply 1.1 application topically daily.   DULoxetine 60 MG capsule Commonly known as: CYMBALTA TAKE 1 CAPSULE BY MOUTH EVERY DAY   hydrocortisone 25 MG suppository Commonly known as: ANUSOL-HC Place 1 suppository (25 mg total) rectally 3 (three) times daily as needed for hemorrhoids or itching. What changed:  when to take this reasons to take this   ipratropium  0.06 % nasal spray Commonly known as: ATROVENT USE 2 SPRAYS IN BOTH  NOSTRILS EVERY 8 HOURS AS  NEEDED FOR RHINITIS   levothyroxine 75 MCG tablet Commonly known as: SYNTHROID TAKE 1 TABLET BY MOUTH EVERY DAY BEFORE BREAKFAST   loratadine 10 MG tablet Commonly known as: CLARITIN Take 10 mg by mouth daily.   MULTIVITAMINS PO Take by mouth.   polyethylene glycol 17 g packet Commonly known as: MIRALAX / GLYCOLAX Take 17 g by mouth as needed.   predniSONE 20 MG tablet Commonly known as: DELTASONE Take 2 tablets (40 mg total) by mouth daily with breakfast.   Spiriva Respimat 2.5 MCG/ACT Aers Generic drug: Tiotropium Bromide Monohydrate INHALE 2 PUFFS BY MOUTH INTO THE LUNGS DAILY   Timolol Maleate PF 0.5 % Soln Place 1 drop into the left eye in the morning and at bedtime.   vitamin B-12 100 MCG tablet Commonly known as: CYANOCOBALAMIN Take 100 mcg by mouth daily.   VITAMIN D PO Take 2,000 mg by mouth daily.   Vitamin K2 100 MCG Tabs Take 1 tablet by mouth daily.        History (reviewed): Past Medical History:  Diagnosis Date   Anxiety    intermittent   blood in stool    CKD (chronic kidney disease), stage III (HCC)    COPD (chronic obstructive pulmonary disease) (HCC)    Diverticulosis    Emphysema lung (HCC)    Fibromyalgia    GERD (gastroesophageal reflux disease)    Glaucoma    Hemorrhoids    HSV-2 (herpes simplex virus 2) infection 2009   Hypothyroidism    OSA on CPAP 07/27/2006   Osteoporosis    Rectal bleeding    with hemorrhoids   Past Surgical History:  Procedure Laterality Date   BUNIONECTOMY  [id number]   CARPAL TUNNEL RELEASE Right 12/08/2016   Procedure: CARPAL TUNNEL RELEASE;  Surgeon: Daryll Brod, MD;  Location: [facility];  Service: Orthopedics;  Laterality: Right;  REG/FAB   CARPAL TUNNEL RELEASE Left 07/02/2021   Procedure: CARPAL TUNNEL RELEASE LEFT;  Surgeon: Daryll Brod, MD;  Location: [facility];   Service: Orthopedics;  Laterality: Left;  70 MIN   CERVICAL FUSION  2008   hemorrhoids removed     TRIGGER FINGER RELEASE  2008   Family History  Problem Relation Age of Onset   Heart attack Father    CAD Father    Hyperlipidemia Mother    Hypertension Mother    CAD Mother    Social History   Socioeconomic History   Marital status: Divorced    Spouse name: Joneen Boers   Number of children: 3   Years of education: 16   Highest education level: Bachelor's degree (e.g., BA, AB, BS)  Occupational History   Occupation: Copywriter, advertising    Comment: retired  Tobacco Use   Smoking status: Former    Packs/day: 2.00    Years: 25.00  Total pack years: 50.00    Types: Cigarettes    Quit date: 03/07/1981    Years since quitting: 40.9   Smokeless tobacco: Never  Vaping Use   Vaping Use: Never used  Substance and Sexual Activity   Alcohol use: Yes    Alcohol/week: 0.0 standard drinks of alcohol    Comment: 2 drinks per month socially   Drug use: No   Sexual activity: Not Currently  Other Topics Concern   Not on file  Social History Narrative   Lives alone. She enjoys going to church.    Social Determinants of Health   Financial Resource Strain: Low Risk  (02/04/2022)   Overall Financial Resource Strain (CARDIA)    Difficulty of Paying Living Expenses: Not hard at all  Food Insecurity: No Food Insecurity (02/04/2022)   Hunger Vital Sign    Worried About Running Out of Food in the Last Year: Never true    Ran Out of Food in the Last Year: Never true  Transportation Needs: No Transportation Needs (02/04/2022)   PRAPARE - Hydrologist (Medical): No    Lack of Transportation (Non-Medical): No  Physical Activity: Inactive (02/04/2022)   Exercise Vital Sign    Days of Exercise per Week: 0 days    Minutes of Exercise per Session: 0 min  Stress: No Stress Concern Present (02/04/2022)   [place]    Feeling of Stress : Not at all  Social Connections: Moderately Isolated (02/04/2022)   Social Connection and Isolation Panel [NHANES]    Frequency of Communication with Friends and Family: Once a week    Frequency of Social Gatherings with Friends and Family: Never    Attends Religious Services: More than 4 times per year    Active Member of [employer] or Organizations: Yes    Attends Archivist Meetings: More than 4 times per year    Marital Status: Divorced    Activities of Daily Living    02/04/2022   11:33 AM 07/02/2021   11:45 AM  In your present state of health, do you have any difficulty performing the following activities:  Hearing? 0 0  Vision? 0 0  Difficulty concentrating or making decisions? 0 0  Walking or climbing stairs? 0 0  Dressing or bathing? 0 0  Doing errands, shopping? 0   Preparing Food and eating ? N   Using the Toilet? N   In the past six months, have you accidently leaked urine? N   Do you have problems with loss of bowel control? N   Managing your Medications? N   Managing your Finances? N   Housekeeping or managing your Housekeeping? N     Patient Education/ Literacy How often do you need to have someone help you when you read instructions, pamphlets, or other written materials from your doctor or pharmacy?: 1 - Never What is the last grade level you completed in school?: Bachelors degree  Exercise Current Exercise Habits: The patient does not participate in regular exercise at present, Exercise limited by: None identified  Diet Patient reports consuming 2 meals a day and 1 snack(s) a day Patient reports that her primary diet is: Regular Patient reports that she does have regular access to food.   Depression Screen    02/04/2022   11:33 AM 03/09/2021    1:50 PM 09/04/2020    1:08 PM 05/22/2019    8:59 AM 05/06/2019  10:12 AM 03/07/2019   10:37 AM 09/04/2018   10:24 AM  PHQ 2/9 Scores  PHQ - 2 Score 0 1 0 1 0 0 0  PHQ-  9 Score  8          Fall Risk    02/04/2022   11:32 AM 03/09/2021    1:50 PM 09/04/2020    1:08 PM 05/06/2019   10:11 AM 03/07/2019   10:36 AM  Fall Risk   Falls in the past year? 1 1 1 1 1   Number falls in past yr: 0 1 0 0 0  Injury with Fall? 0 0 0 0 1  Risk for fall due to : Impaired balance/gait;History of fall(s)  History of fall(s) Impaired balance/gait Other (Comment)  Risk for fall due to: Comment     pt was running from a dog  Follow up Falls evaluation completed;Education provided;Falls prevention discussed Falls evaluation completed Falls evaluation completed Falls prevention discussed Falls prevention discussed     Objective:  [name] [name] seemed alert and oriented and she participated appropriately during our telephone visit.  Blood Pressure Weight BMI  BP Readings from Last 3 Encounters:  08/16/21 124/72  07/02/21 (!) 146/82  03/09/21 128/67   Wt Readings from Last 3 Encounters:  08/16/21 142 lb (64.4 kg)  07/02/21 141 lb 5 oz (64.1 kg)  03/09/21 143 lb (64.9 kg)   BMI Readings from Last 1 Encounters:  08/16/21 25.97 kg/m    *Unable to obtain current vital signs, weight, and BMI due to telephone visit type  Hearing/Vision  [name] [name] did not seem to have difficulty with hearing/understanding during the telephone conversation Reports that she has had a formal eye exam by an eye care professional within the past year Reports that she has not had a formal hearing evaluation within the past year *Unable to fully assess hearing and vision during telephone visit type  Cognitive Function:    02/04/2022   11:36 AM 05/06/2019   10:17 AM 04/30/2018    8:28 AM  6CIT Screen  What Year? 0 points 0 points 0 points  What month? 0 points 0 points 0 points  What time? 0 points 0 points 0 points  Count back from 20 0 points 0 points 0 points  Months in reverse 0 points 0 points 0 points  Repeat phrase 0 points 0 points 0 points  Total Score 0 points 0 points 0  points   (Normal:0-7, Significant for Dysfunction: >8)  Normal Cognitive Function Screening: Yes   Immunization & Health Maintenance Record Immunization History  Administered Date(s) Administered   Fluad Quad(high Dose 65+) 01/09/2019, 01/27/2020   Influenza, High Dose Seasonal PF 01/19/2017, 02/05/2018   Influenza,inj,Quad PF,6+ Mos 04/08/2015, 02/04/2016   Influenza-Unspecified 02/17/2021   PFIZER(Purple Top)SARS-COV-2 Vaccination 06/02/2019, 06/24/2019, 02/20/2020, 02/17/2021   Pneumococcal Conjugate-13 04/10/2014   Pneumococcal Polysaccharide-23 03/17/2004   Rabies, IM 01/17/2021, 01/17/2021, 01/20/2021, 01/24/2021, 01/31/2021   Tdap 06/26/2007, 08/15/2017   Zoster Recombinat (Shingrix) 07/26/2019, 10/14/2019   Zoster, Live 03/01/2006    Health Maintenance  Topic Date Due   COVID-19 Vaccine (5 - Pfizer risk series) 02/20/2022 (Originally 04/14/2021)   INFLUENZA VACCINE  08/07/2022 (Originally 12/07/2021)   TETANUS/TDAP  08/16/2027   Pneumonia Vaccine 23+ Years old  Completed   DEXA SCAN  Completed   Zoster Vaccines- Shingrix  Completed   HPV VACCINES  Aged Out       Assessment  This is a routine wellness examination for [employer].  Health Maintenance: Due or Overdue There are no preventive care reminders to display for this patient.   Alferd Apa Mannings does not need a referral for Community Assistance: Care Management:   no Social Work:    no Prescription Assistance:  no Nutrition/Diabetes Education:  no   Plan:  Personalized Goals  Goals Addressed               This Visit's Progress     Patient Stated (pt-stated)        02/04/2022 AWV Goal: Exercise for General Health  Patient will verbalize understanding of the benefits of increased physical activity: Exercising regularly is important. It will improve your overall fitness, flexibility, and endurance. Regular exercise also will improve your overall health. It can help you control your  weight, reduce stress, and improve your bone density. Over the next year, patient will increase physical activity as tolerated with a goal of at least 150 minutes of moderate physical activity per week.  You can tell that you are exercising at a moderate intensity if your heart starts beating faster and you start breathing faster but can still hold a conversation. Moderate-intensity exercise ideas include: Walking 1 mile (1.6 km) in about 15 minutes Biking Hiking Golfing Dancing Water aerobics Patient will verbalize understanding of everyday activities that increase physical activity by providing examples like the following: Yard work, such as: Sales promotion account executive Gardening Washing windows or floors Patient will be able to explain general safety guidelines for exercising:  Before you start a new exercise program, talk with your health care provider. Do not exercise so much that you hurt yourself, feel dizzy, or get very short of breath. Wear comfortable clothes and wear shoes with good support. Drink plenty of water while you exercise to prevent dehydration or heat stroke. Work out until your breathing and your heartbeat get faster.        Personalized Health Maintenance & Screening Recommendations  Influenza vaccine   Lung Cancer Screening Recommended: no (Low Dose CT Chest recommended if Age 60-80 years, 30 pack-year currently smoking OR have quit w/in past 15 years) Hepatitis C Screening recommended: no HIV Screening recommended: no  Advanced Directives: Written information was not prepared per patient's request.  Referrals & Orders No orders of the defined types were placed in this encounter.   Follow-up Plan Follow-up with Hali Marry, MD as planned Schedule your influenza vaccine. Medicare wellness visit in one year. Patient will access AVS on my chart.   I have personally  reviewed and noted the following in the patient's chart:   Medical and social history Use of alcohol, tobacco or illicit drugs  Current medications and supplements Functional ability and status Nutritional status Physical activity Advanced directives List of other physicians Hospitalizations, surgeries, and ER visits in previous 12 months Vitals Screenings to include cognitive, depression, and falls Referrals and appointments  In addition, I have reviewed and discussed with [name] [name] certain preventive protocols, quality metrics, and best practice recommendations. A written personalized care plan for preventive services as well as general preventive health recommendations is available and can be mailed to the patient at her request.      Tinnie Gens, RN BSN  02/04/2022

## 2022-02-04 NOTE — Patient Instructions (Addendum)
[place] Maintenance Summary and Written Plan of Care  Ms. Short ,  Thank you for allowing me to perform your Medicare Annual Wellness Visit and for your ongoing commitment to your health.   Health Maintenance & Immunization History Health Maintenance  Topic Date Due  . COVID-19 Vaccine (5 - Pfizer risk series) 02/20/2022 (Originally 04/14/2021)  . INFLUENZA VACCINE  08/07/2022 (Originally 12/07/2021)  . TETANUS/TDAP  08/16/2027  . Pneumonia Vaccine 101+ Years old  Completed  . DEXA SCAN  Completed  . Zoster Vaccines- Shingrix  Completed  . HPV VACCINES  Aged Out   Immunization History  Administered Date(s) Administered  . Fluad Quad(high Dose 65+) 01/09/2019, 01/27/2020  . Influenza, High Dose Seasonal PF 01/19/2017, 02/05/2018  . Influenza,inj,Quad PF,6+ Mos 04/08/2015, 02/04/2016  . Influenza-Unspecified 02/17/2021  . PFIZER(Purple Top)SARS-COV-2 Vaccination 06/02/2019, 06/24/2019, 02/20/2020, 02/17/2021  . Pneumococcal Conjugate-13 04/10/2014  . Pneumococcal Polysaccharide-23 03/17/2004  . Rabies, IM 01/17/2021, 01/17/2021, 01/20/2021, 01/24/2021, 01/31/2021  . Tdap 06/26/2007, 08/15/2017  . Zoster Recombinat (Shingrix) 07/26/2019, 10/14/2019  . Zoster, Live 03/01/2006    These are the patient goals that we discussed:  Goals Addressed              This Visit's Progress   .  Patient Stated (pt-stated)        02/04/2022 AWV Goal: Exercise for General Health  Patient will verbalize understanding of the benefits of increased physical activity: Exercising regularly is important. It will improve your overall fitness, flexibility, and endurance. Regular exercise also will improve your overall health. It can help you control your weight, reduce stress, and improve your bone density. Over the next year, patient will increase physical activity as tolerated with a goal of at least 150 minutes of moderate physical activity per week.  You can  tell that you are exercising at a moderate intensity if your heart starts beating faster and you start breathing faster but can still hold a conversation. Moderate-intensity exercise ideas include: Walking 1 mile (1.6 km) in about 15 minutes Biking Hiking Golfing Dancing Water aerobics Patient will verbalize understanding of everyday activities that increase physical activity by providing examples like the following: Yard work, such as: Sales promotion account executive Gardening Washing windows or floors Patient will be able to explain general safety guidelines for exercising:  Before you start a new exercise program, talk with your health care provider. Do not exercise so much that you hurt yourself, feel dizzy, or get very short of breath. Wear comfortable clothes and wear shoes with good support. Drink plenty of water while you exercise to prevent dehydration or heat stroke. Work out until your breathing and your heartbeat get faster.         This is a list of Health Maintenance Items that are overdue or due now: Influenza vaccine  Orders/Referrals Placed Today: No orders of the defined types were placed in this encounter.  (Contact our referral department at [phone] if you have not spoken with someone about your referral appointment within the next 5 days)    Follow-up Plan Follow-up with Hali Marry, MD as planned Schedule your influenza vaccine. Medicare wellness visit in one year. Patient will access AVS on my chart.      Health Maintenance, Female Adopting a healthy lifestyle and getting preventive care are important in promoting health and wellness. Ask your health care provider about: The right schedule for you to  have regular tests and exams. Things you can do on your own to prevent diseases and keep yourself healthy. What should I know about diet, weight, and exercise? Eat a  healthy diet  Eat a diet that includes plenty of vegetables, fruits, low-fat dairy products, and lean protein. Do not eat a lot of foods that are high in solid fats, added sugars, or sodium. Maintain a healthy weight Body mass index (BMI) is used to identify weight problems. It estimates body fat based on height and weight. Your health care provider can help determine your BMI and help you achieve or maintain a healthy weight. Get regular exercise Get regular exercise. This is one of the most important things you can do for your health. Most adults should: Exercise for at least 150 minutes each week. The exercise should increase your heart rate and make you sweat (moderate-intensity exercise). Do strengthening exercises at least twice a week. This is in addition to the moderate-intensity exercise. Spend less time sitting. Even light physical activity can be beneficial. Watch cholesterol and blood lipids Have your blood tested for lipids and cholesterol at 83 years of age, then have this test every 5 years. Have your cholesterol levels checked more often if: Your lipid or cholesterol levels are high. You are older than 83 years of age. You are at high risk for heart disease. What should I know about cancer screening? Depending on your health history and family history, you may need to have cancer screening at various ages. This may include screening for: Breast cancer. Cervical cancer. Colorectal cancer. Skin cancer. Lung cancer. What should I know about heart disease, diabetes, and high blood pressure? Blood pressure and heart disease High blood pressure causes heart disease and increases the risk of stroke. This is more likely to develop in people who have high blood pressure readings or are overweight. Have your blood pressure checked: Every 3-5 years if you are 51-33 years of age. Every year if you are 35 years old or older. Diabetes Have regular diabetes screenings. This checks  your fasting blood sugar level. Have the screening done: Once every three years after age 64 if you are at a normal weight and have a low risk for diabetes. More often and at a younger age if you are overweight or have a high risk for diabetes. What should I know about preventing infection? Hepatitis B If you have a higher risk for hepatitis B, you should be screened for this virus. Talk with your health care provider to find out if you are at risk for hepatitis B infection. Hepatitis C Testing is recommended for: Everyone born from [birth year] through 1967. Anyone with known risk factors for hepatitis C. Sexually transmitted infections (STIs) Get screened for STIs, including gonorrhea and chlamydia, if: You are sexually active and are younger than 83 years of age. You are older than 82 years of age and your health care provider tells you that you are at risk for this type of infection. Your sexual activity has changed since you were last screened, and you are at increased risk for chlamydia or gonorrhea. Ask your health care provider if you are at risk. Ask your health care provider about whether you are at high risk for HIV. Your health care provider may recommend a prescription medicine to help prevent HIV infection. If you choose to take medicine to prevent HIV, you should first get tested for HIV. You should then be tested every 3 months for as long as  you are taking the medicine. Pregnancy If you are about to stop having your period (premenopausal) and you may become pregnant, seek counseling before you get pregnant. Take 400 to 800 micrograms (mcg) of folic acid every day if you become pregnant. Ask for birth control (contraception) if you want to prevent pregnancy. Osteoporosis and menopause Osteoporosis is a disease in which the bones lose minerals and strength with aging. This can result in bone fractures. If you are 11 years old or older, or if you are at risk for osteoporosis and fractures,  ask your health care provider if you should: Be screened for bone loss. Take a calcium or vitamin D supplement to lower your risk of fractures. Be given hormone replacement therapy (HRT) to treat symptoms of menopause. Follow these instructions at home: Alcohol use Do not drink alcohol if: Your health care provider tells you not to drink. You are pregnant, may be pregnant, or are planning to become pregnant. If you drink alcohol: Limit how much you have to: 0-1 drink a day. Know how much alcohol is in your drink. In the [place] one drink equals one 12 oz bottle of beer (355 mL), one 5 oz glass of wine (148 mL), or one 1 oz glass of hard liquor (44 mL). Lifestyle Do not use any products that contain nicotine or tobacco. These products include cigarettes, chewing tobacco, and vaping devices, such as e-cigarettes. If you need help quitting, ask your health care provider. Do not use street drugs. Do not share needles. Ask your health care provider for help if you need support or information about quitting drugs. General instructions Schedule regular health, dental, and eye exams. Stay current with your vaccines. Tell your health care provider if: You often feel depressed. You have ever been abused or do not feel safe at home. Summary Adopting a healthy lifestyle and getting preventive care are important in promoting health and wellness. Follow your health care provider's instructions about healthy diet, exercising, and getting tested or screened for diseases. Follow your health care provider's instructions on monitoring your cholesterol and blood pressure. This information is not intended to replace advice given to you by your health care provider. Make sure you discuss any questions you have with your health care provider. Document Revised: 09/14/2020 Document Reviewed: 09/14/2020 Elsevier Patient Education  [employer] [place].

## 2022-02-14 ENCOUNTER — Telehealth: Payer: Self-pay | Admitting: Neurology

## 2022-02-14 DIAGNOSIS — E039 Hypothyroidism, unspecified: Secondary | ICD-10-CM

## 2022-02-14 DIAGNOSIS — N1831 Chronic kidney disease, stage 3a: Secondary | ICD-10-CM

## 2022-02-14 NOTE — Telephone Encounter (Signed)
Orders Placed This Encounter  Procedures   BASIC METABOLIC PANEL WITH GFR   TSH

## 2022-02-14 NOTE — Telephone Encounter (Signed)
Patient left vm wanting to do labs before her appt on Thursday. She had labs in April. Please advise what labs are needed?

## 2022-02-15 NOTE — Telephone Encounter (Signed)
Patient made aware and will wait and have labs done Thursday at appt.

## 2022-02-17 ENCOUNTER — Ambulatory Visit: Payer: Medicare PPO | Admitting: Family Medicine

## 2022-02-17 ENCOUNTER — Ambulatory Visit (INDEPENDENT_AMBULATORY_CARE_PROVIDER_SITE_OTHER): Payer: Medicare PPO

## 2022-02-17 VITALS — BP 121/86 | HR 94 | Ht 62.0 in | Wt 151.0 lb

## 2022-02-17 DIAGNOSIS — M797 Fibromyalgia: Secondary | ICD-10-CM

## 2022-02-17 DIAGNOSIS — R059 Cough, unspecified: Secondary | ICD-10-CM

## 2022-02-17 DIAGNOSIS — Z23 Encounter for immunization: Secondary | ICD-10-CM

## 2022-02-17 DIAGNOSIS — J432 Centrilobular emphysema: Secondary | ICD-10-CM

## 2022-02-17 DIAGNOSIS — R0602 Shortness of breath: Secondary | ICD-10-CM

## 2022-02-17 DIAGNOSIS — N1831 Chronic kidney disease, stage 3a: Secondary | ICD-10-CM | POA: Diagnosis not present

## 2022-02-17 DIAGNOSIS — J449 Chronic obstructive pulmonary disease, unspecified: Secondary | ICD-10-CM | POA: Diagnosis not present

## 2022-02-17 DIAGNOSIS — E039 Hypothyroidism, unspecified: Secondary | ICD-10-CM

## 2022-02-17 DIAGNOSIS — M25551 Pain in right hip: Secondary | ICD-10-CM

## 2022-02-17 DIAGNOSIS — J439 Emphysema, unspecified: Secondary | ICD-10-CM | POA: Diagnosis not present

## 2022-02-17 MED ORDER — DULOXETINE HCL 60 MG PO CPEP: ORAL_CAPSULE | ORAL | 1 refills | Status: DC

## 2022-02-17 MED ORDER — ALBUTEROL SULFATE HFA 108 (90 BASE) MCG/ACT IN AERS: INHALATION_SPRAY | RESPIRATORY_TRACT | 2 refills | Status: DC

## 2022-02-17 NOTE — Assessment & Plan Note (Signed)
Continue with daily Spiriva.  We will send over updated albuterol.  Since she has noticed some progression over the last 3 to 4 months had like to get an updated chest x-ray and I think it would be good for her to get back in with Dr. Lamonte Sakai from updated pulmonary function test.

## 2022-02-17 NOTE — Assessment & Plan Note (Signed)
Okay to use Tylenol daily if needed.  She also has a lot of muscle soreness.

## 2022-02-17 NOTE — Assessment & Plan Note (Signed)
Check renal function. 

## 2022-02-17 NOTE — Assessment & Plan Note (Signed)
Check thyroid level.  Lab order was placed outside of this encounter but she will have that drawn today after the visit.

## 2022-02-17 NOTE — Progress Notes (Signed)
Established Patient Office Visit  Subjective   Patient ID: [name] [name], female    DOB: 1940/10/06  Age: 81 y.o. MRN: [id number]  Chief Complaint  Patient presents with   Hypothyroidism    Follow up    Chronic Kidney Disease    Follow up     HPI  Follow-up emphysema - since the early summer, may be 3 to 4 months, has noticed more SOB, and fatigue with activity.  She has had a slight increase in cough as well.  She has not heard any increased chest sounds or wheezing.  She did realize though that her albuterol was expired and does need a new one.  She is to see Dr. Lamonte Sakai but has not seen him in several years.  So using Spiriva daily.  She gets really easily tired just with walking and vacuuming and doing household chores.  No chest pain.  She also takes loratadine daily and says that helps.  Hypothyroidism - Taking medication regularly in the AM away from food and vitamins, etc. No recent change to skin, hair, or energy levels.  She does get a lot of diffuse joint pain she uses Tylenol and CBD cream.  She does feel like they help.    ROS    Objective:     BP 121/86   Pulse 94   Ht 5\' 2"  (1.575 m)   Wt 151 lb (68.5 kg)   SpO2 95%   BMI 27.62 kg/m    Physical Exam Vitals and nursing note reviewed.  Constitutional:      Appearance: Normal appearance. She is well-developed.  HENT:     Head: Normocephalic and atraumatic.     Right Ear: Tympanic membrane, ear canal and external ear normal.     Left Ear: Tympanic membrane, ear canal and external ear normal.     Nose: Nose normal.     Mouth/Throat:     Mouth: Mucous membranes are moist.     Pharynx: Oropharynx is clear. No posterior oropharyngeal erythema.  Eyes:     Conjunctiva/sclera: Conjunctivae normal.     Pupils: Pupils are equal, round, and reactive to light.  Neck:     Thyroid: No thyromegaly.  Cardiovascular:     Rate and Rhythm: Normal rate and regular rhythm.     Heart sounds: Normal heart sounds.   Pulmonary:     Effort: Pulmonary effort is normal.     Breath sounds: Normal breath sounds. No wheezing.  Musculoskeletal:     Cervical back: Neck supple.  Lymphadenopathy:     Cervical: No cervical adenopathy.  Skin:    General: Skin is warm and dry.  Neurological:     Mental Status: She is alert and oriented to person, place, and time.  Psychiatric:        Behavior: Behavior normal.     No results found for any visits on 02/17/22.    The ASCVD Risk score (Arnett DK, et al., 2019) failed to calculate for the following reasons:   The 2019 ASCVD risk score is only valid for ages 7 to 71    Assessment & Plan:   Problem List Items Addressed This Visit       Respiratory   Centrilobular emphysema ([place])    Continue with daily Spiriva.  We will send over updated albuterol.  Since she has noticed some progression over the last 3 to 4 months had like to get an updated chest x-ray and I think it would be  good for her to get back in with Dr. Lamonte Sakai from updated pulmonary function test.      Relevant Medications   albuterol (PROAIR HFA) 108 (90 Base) MCG/ACT inhaler   Other Relevant Orders   DG Chest 2 View     Endocrine   Hypothyroid - Primary    Check thyroid level.  Lab order was placed outside of this encounter but she will have that drawn today after the visit.        Genitourinary   CKD (chronic kidney disease), stage III (HCC)    Check renal function.        Other   Fibromyalgia (Chronic)    Okay to use Tylenol daily if needed.  She also has a lot of muscle soreness.      Relevant Medications   DULoxetine (CYMBALTA) 60 MG capsule   Other Visit Diagnoses     Right hip pain       Relevant Medications   DULoxetine (CYMBALTA) 60 MG capsule      Deceived COVID-vaccine today she knows to call imaging so that they can push out her mammogram.  If she starts to experience worsening symptoms or muscle weakness then we can also check inflammatory  markers.  Return in about 6 months (around 08/19/2022) for LUng and thyroid.    Beatrice Lecher, MD

## 2022-02-18 LAB — TSH: TSH: 1.38 mIU/L (ref 0.40–4.50)

## 2022-02-18 LAB — BASIC METABOLIC PANEL WITH GFR
BUN: 21 mg/dL (ref 7–25)
CO2: 29 mmol/L (ref 20–32)
Calcium: 9.6 mg/dL (ref 8.6–10.4)
Chloride: 104 mmol/L (ref 98–110)
Creat: 0.66 mg/dL (ref 0.60–0.95)
Glucose, Bld: 98 mg/dL (ref 65–99)
Potassium: 4.3 mmol/L (ref 3.5–5.3)
Sodium: 142 mmol/L (ref 135–146)
eGFR: 87 mL/min/{1.73_m2} (ref >=60)

## 2022-02-18 NOTE — Progress Notes (Signed)
Your lab work is within acceptable range and there are no concerning findings.   ?

## 2022-02-19 NOTE — Progress Notes (Signed)
Hi [name] [name], the chest xray shows your COPD and some scarring esp on the right but not sign of infection  WE can schedule you for spirometry which is a breathing test to see how well your lungs are functioning.

## 2022-02-21 ENCOUNTER — Telehealth: Payer: Self-pay

## 2022-02-21 NOTE — Telephone Encounter (Signed)
Please call and schedule patient for Spirometry. Thanks

## 2022-02-21 NOTE — Addendum Note (Signed)
Addended by: Rae Lips on: 02/21/2022 11:54 AM   Modules accepted: Orders

## 2022-03-03 ENCOUNTER — Ambulatory Visit: Payer: Medicare PPO

## 2022-03-17 ENCOUNTER — Ambulatory Visit (INDEPENDENT_AMBULATORY_CARE_PROVIDER_SITE_OTHER): Payer: Medicare PPO

## 2022-03-17 ENCOUNTER — Ambulatory Visit (INDEPENDENT_AMBULATORY_CARE_PROVIDER_SITE_OTHER): Payer: Medicare PPO | Admitting: Family Medicine

## 2022-03-17 VITALS — Temp 98.1°F

## 2022-03-17 DIAGNOSIS — Z1231 Encounter for screening mammogram for malignant neoplasm of breast: Secondary | ICD-10-CM

## 2022-03-17 DIAGNOSIS — Z23 Encounter for immunization: Secondary | ICD-10-CM | POA: Diagnosis not present

## 2022-03-17 NOTE — Progress Notes (Signed)
Pt here for flu shot. Afebrile,no recent illness. Vaccination given, pt tolerated well.  

## 2022-03-18 NOTE — Progress Notes (Signed)
Please call patient. Normal mammogram.  Repeat in 1 year.  

## 2022-03-22 ENCOUNTER — Ambulatory Visit (INDEPENDENT_AMBULATORY_CARE_PROVIDER_SITE_OTHER): Payer: Medicare PPO | Admitting: Family Medicine

## 2022-03-22 VITALS — BP 123/61 | HR 85 | Ht 62.0 in

## 2022-03-22 DIAGNOSIS — J432 Centrilobular emphysema: Secondary | ICD-10-CM

## 2022-03-22 MED ORDER — SEREVENT DISKUS 50 MCG/ACT IN AEPB: 1.0000 | INHALATION_SPRAY | Freq: Two times a day (BID) | RESPIRATORY_TRACT | 0 refills | Status: DC

## 2022-03-22 MED ORDER — ALBUTEROL SULFATE HFA 108 (90 BASE) MCG/ACT IN AERS
2.0000 | INHALATION_SPRAY | Freq: Once | RESPIRATORY_TRACT | Status: AC
  Administered 2022-03-22: 2 via RESPIRATORY_TRACT

## 2022-03-22 NOTE — Progress Notes (Signed)
   Established Patient Office Visit  Subjective   Patient ID: [name] [name], female    DOB: [birth year]  Age: 81 y.o. MRN: [id number]  Chief Complaint  Patient presents with   spirometry    Nurse visit     HPI  Here today for spirometry.  History of emphysema.  Been noticing more shortness of breath in general over the last 4 to 5 months.  Also increased fatigue with activity.  Also increased cough.  She is currently on Spiriva and uses albuterol as needed.    ROS    Objective:     BP 123/61   Pulse 85   Ht 5\' 2"  (1.575 m)   SpO2 96%   BMI 27.62 kg/m    Physical Exam Vitals and nursing note reviewed.  Constitutional:      Appearance: She is well-developed.  HENT:     Head: Normocephalic and atraumatic.  Cardiovascular:     Rate and Rhythm: Normal rate and regular rhythm.     Heart sounds: Normal heart sounds.  Pulmonary:     Effort: Pulmonary effort is normal.     Breath sounds: Normal breath sounds.  Skin:    General: Skin is warm and dry.  Neurological:     Mental Status: She is alert and oriented to person, place, and time.  Psychiatric:        Behavior: Behavior normal.      No results found for any visits on 03/22/22.    The ASCVD Risk score (Arnett DK, et al., 2019) failed to calculate for the following reasons:   The 2019 ASCVD risk score is only valid for ages 42 to 40    Assessment & Plan:   Problem List Items Addressed This Visit       Respiratory   Centrilobular emphysema (HCC) - Primary    03/2022: FVC of 63%, FEV1 of 55%, mild bronchodilator response 10% improvement with FEV1 after albuterol.  Ratio of 63. We discussed switching Spiriva to 04/2022.  She just filled a 90-day supply on the Spiriva so we discussed adding a long-acting albuterol at least short-term until she gets low on the Spiriva and then we can switch to [employer].      Relevant Medications   salmeterol (SEREVENT DISKUS) 50 MCG/ACT diskus inhaler   Other  Relevant Orders   PR EVAL OF BRONCHOSPASM    No follow-ups on file.    [employer], MD

## 2022-03-22 NOTE — Assessment & Plan Note (Signed)
03/2022: FVC of 63%, FEV1 of 55%, mild bronchodilator response 10% improvement with FEV1 after albuterol.  Ratio of 63. We discussed switching Spiriva to [employer].  She just filled a 90-day supply on the Spiriva so we discussed adding a long-acting albuterol at least short-term until she gets low on the Spiriva and then we can switch to [employer].

## 2022-03-22 NOTE — Patient Instructions (Signed)
When you get low on Spiriva we can switch to [employer].

## 2022-03-30 ENCOUNTER — Telehealth: Payer: Self-pay

## 2022-03-30 NOTE — Telephone Encounter (Signed)
Initiated Prior authorization ZES:[id number] Diskus 50MCG/ACT aerosol powder Via: Covermymeds Case/Key:[id number] Status: approved  as of 05/30/21 Reason:Coverage Starts on: 05/09/2021 12:00:00 AM, Coverage Ends on: 05/09/2023 Notified Pt via: Mychart

## 2022-04-18 ENCOUNTER — Other Ambulatory Visit: Payer: Self-pay | Admitting: Family Medicine

## 2022-04-18 DIAGNOSIS — E038 Other specified hypothyroidism: Secondary | ICD-10-CM

## 2022-04-28 ENCOUNTER — Other Ambulatory Visit: Payer: Self-pay | Admitting: Family Medicine

## 2022-05-04 ENCOUNTER — Other Ambulatory Visit: Payer: Self-pay | Admitting: Family Medicine

## 2022-05-04 DIAGNOSIS — M797 Fibromyalgia: Secondary | ICD-10-CM

## 2022-05-04 DIAGNOSIS — M25551 Pain in right hip: Secondary | ICD-10-CM

## 2022-05-16 ENCOUNTER — Telehealth: Payer: Medicare PPO | Admitting: Physician Assistant

## 2022-05-16 DIAGNOSIS — B9689 Other specified bacterial agents as the cause of diseases classified elsewhere: Secondary | ICD-10-CM | POA: Diagnosis not present

## 2022-05-16 DIAGNOSIS — J208 Acute bronchitis due to other specified organisms: Secondary | ICD-10-CM

## 2022-05-16 MED ORDER — BENZONATATE 100 MG PO CAPS: 100.0000 mg | ORAL_CAPSULE | Freq: Three times a day (TID) | ORAL | 0 refills | Status: DC | PRN

## 2022-05-16 MED ORDER — DOXYCYCLINE HYCLATE 100 MG PO TABS: 100.0000 mg | ORAL_TABLET | Freq: Two times a day (BID) | ORAL | 0 refills | Status: DC

## 2022-05-16 NOTE — Patient Instructions (Signed)
Daphane Shepherd, thank you for joining Mar Daring, PA-C for today's virtual visit.  While this provider is not your primary care provider (PCP), if your PCP is located in our provider database this encounter information will be shared with them immediately following your visit.   [facility] account gives you access to today's visit and all your visits, tests, and labs performed at [facility] " click here if you don't have a [place] account or go to [email or web address]  Consent: (Patient) [name] [name] provided verbal consent for this virtual visit at the beginning of the encounter.  Current Medications:  Current Outpatient Medications:    benzonatate (TESSALON) 100 MG capsule, Take 1 capsule (100 mg total) by mouth 3 (three) times daily as needed., Disp: 30 capsule, Rfl: 0   doxycycline (VIBRA-TABS) 100 MG tablet, Take 1 tablet (100 mg total) by mouth 2 (two) times daily., Disp: 20 tablet, Rfl: 0   albuterol (PROAIR HFA) 108 (90 Base) MCG/ACT inhaler, INHALE 2 PUFFS INTO THE LUNGS EVERY 4 (FOUR) HOURS AS NEEDED FOR WHEEZING OR SHORTNESS OF BREATH., Disp: 18 g, Rfl: 2   atorvastatin (LIPITOR) 10 MG tablet, TAKE 1 TABLET BY MOUTH EVERY DAY, Disp: 90 tablet, Rfl: 3   bifidobacterium infantis (ALIGN) capsule, Take 1 capsule by mouth daily., Disp: , Rfl:    Calcium-Phosphorus-Vitamin D (CALCIUM/D3 ADULT GUMMIES PO), Take 2 tablets daily by mouth. 2 gummies daily with meal, Disp: , Rfl:    Cholecalciferol (VITAMIN D PO), Take 2,000 mg by mouth daily., Disp: , Rfl:    DULoxetine (CYMBALTA) 60 MG capsule, TAKE 1 CAPSULE BY MOUTH EVERY DAY, Disp: 90 capsule, Rfl: 1   ipratropium (ATROVENT) 0.06 % nasal spray, USE 2 SPRAYS IN BOTH  NOSTRILS EVERY 8 HOURS AS  NEEDED FOR RHINITIS, Disp: 105 mL, Rfl: 1   levothyroxine (SYNTHROID) 75 MCG tablet, TAKE 1 TABLET BY MOUTH EVERY DAY BEFORE BREAKFAST, Disp: 90 tablet, Rfl: 0   loratadine (CLARITIN) 10  MG tablet, Take 10 mg by mouth daily., Disp: , Rfl:    Menatetrenone (VITAMIN K2) 100 MCG TABS, Take 1 tablet by mouth daily., Disp: , Rfl:    Multiple Vitamin (MULTIVITAMINS PO), Take by mouth., Disp: , Rfl:    polyethylene glycol (MIRALAX / GLYCOLAX) packet, Take 17 g by mouth as needed. , Disp: , Rfl:    salmeterol (SEREVENT DISKUS) 50 MCG/ACT diskus inhaler, Inhale 1 puff into the lungs 2 (two) times daily., Disp: 3 each, Rfl: 0   SPIRIVA RESPIMAT 2.5 MCG/ACT AERS, INHALE 2 PUFFS BY MOUTH INTO THE LUNGS DAILY, Disp: 12 g, Rfl: 3   Timolol Maleate PF 0.5 % SOLN, Place 1 drop into the left eye in the morning and at bedtime., Disp: , Rfl:    vitamin B-12 (CYANOCOBALAMIN) 100 MCG tablet, Take 100 mcg by mouth daily., Disp: , Rfl:    Medications ordered in this encounter:  Meds ordered this encounter  Medications   doxycycline (VIBRA-TABS) 100 MG tablet    Sig: Take 1 tablet (100 mg total) by mouth 2 (two) times daily.    Dispense:  20 tablet    Refill:  0    Order Specific Question:   Supervising Provider    Answer:   Chase Picket [[id number]]   benzonatate (TESSALON) 100 MG capsule    Sig: Take 1 capsule (100 mg total) by mouth 3 (three) times daily as needed.    Dispense:  30 capsule  Refill:  0    Order Specific Question:   Supervising Provider    Answer:   Merrilee Jansky [[id number]]     *If you need refills on other medications prior to your next appointment, please contact your pharmacy*  Follow-Up: Call back or seek an in-person evaluation if the symptoms worsen or if the condition fails to improve as anticipated.  [facility] [phone]  Other Instructions   Acute Bronchitis, Adult  Acute bronchitis is sudden inflammation of the main airways (bronchi) that come off the windpipe (trachea) in the lungs. The swelling causes the airways to get smaller and make more mucus than normal. This can make it hard to breathe and can cause coughing or noisy  breathing (wheezing). Acute bronchitis may last several weeks. The cough may last longer. Allergies, asthma, and exposure to smoke may make the condition worse. What are the causes? This condition can be caused by germs and by substances that irritate the lungs, including: Cold and flu viruses. The most common cause of this condition is the virus that causes the common cold. Bacteria. This is less common. Breathing in substances that irritate the lungs, including: Smoke from cigarettes and other forms of tobacco. Dust and pollen. Fumes from household cleaning products, gases, or burned fuel. Indoor or outdoor air pollution. What increases the risk? The following factors may make you more likely to develop this condition: A weak body's defense system, also called the immune system. A condition that affects your lungs and breathing, such as asthma. What are the signs or symptoms? Common symptoms of this condition include: Coughing. This may bring up clear, yellow, or green mucus from your lungs (sputum). Wheezing. Runny or stuffy nose. Having too much mucus in your lungs (chest congestion). Shortness of breath. Aches and pains, including sore throat or chest. How is this diagnosed? This condition is usually diagnosed based on: Your symptoms and medical history. A physical exam. You may also have other tests, including tests to rule out other conditions, such as pneumonia. These tests include: A test of lung function. Test of a mucus sample to look for the presence of bacteria. Tests to check the oxygen level in your blood. Blood tests. Chest X-ray. How is this treated? Most cases of acute bronchitis clear up over time without treatment. Your health care provider may recommend: Drinking more fluids to help thin your mucus so it is easier to cough up. Taking inhaled medicine (inhaler) to improve air flow in and out of your lungs. Using a vaporizer or a humidifier. These are machines  that add water to the air to help you breathe better. Taking a medicine that thins mucus and clears congestion (expectorant). Taking a medicine that prevents or stops coughing (cough suppressant). It is not common to take an antibiotic medicine for this condition. Follow these instructions at home:  Take over-the-counter and prescription medicines only as told by your health care provider. Use an inhaler, vaporizer, or humidifier as told by your health care provider. Take two teaspoons (10 mL) of honey at bedtime to lessen coughing at night. Drink enough fluid to keep your urine pale yellow. Do not use any products that contain nicotine or tobacco. These products include cigarettes, chewing tobacco, and vaping devices, such as e-cigarettes. If you need help quitting, ask your health care provider. Get plenty of rest. Return to your normal activities as told by your health care provider. Ask your health care provider what activities are safe for  you. Keep all follow-up visits. This is important. How is this prevented? To lower your risk of getting this condition again: Wash your hands often with soap and water for at least 20 seconds. If soap and water are not available, use hand sanitizer. Avoid contact with people who have cold symptoms. Try not to touch your mouth, nose, or eyes with your hands. Avoid breathing in smoke or chemical fumes. Breathing smoke or chemical fumes will make your condition worse. Get the flu shot every year. Contact a health care provider if: Your symptoms do not improve after 2 weeks. You have trouble coughing up the mucus. Your cough keeps you awake at night. You have a fever. Get help right away if you: Cough up blood. Feel pain in your chest. Have severe shortness of breath. Faint or keep feeling like you are going to faint. Have a severe headache. Have a fever or chills that get worse. These symptoms may represent a serious problem that is an emergency.  Do not wait to see if the symptoms will go away. Get medical help right away. Call your local emergency services (911 in the [place] Do not drive yourself to the hospital. Summary Acute bronchitis is inflammation of the main airways (bronchi) that come off the windpipe (trachea) in the lungs. The swelling causes the airways to get smaller and make more mucus than normal. Drinking more fluids can help thin your mucus so it is easier to cough up. Take over-the-counter and prescription medicines only as told by your health care provider. Do not use any products that contain nicotine or tobacco. These products include cigarettes, chewing tobacco, and vaping devices, such as e-cigarettes. If you need help quitting, ask your health care provider. Contact a health care provider if your symptoms do not improve after 2 weeks. This information is not intended to replace advice given to you by your health care provider. Make sure you discuss any questions you have with your health care provider. Document Revised: 08/05/2021 Document Reviewed: 08/26/2020 Elsevier Patient Education  2023 Elsevier Inc.    If you have been instructed to have an in-person evaluation today at a local Urgent Care facility, please use the link below. It will take you to a list of all of our available [facility], including address, phone number and hours of operation. Please do not delay care.  [facility]  If you or a family member do not have a primary care provider, use the link below to schedule a visit and establish care. When you choose a [facility] primary care physician or advanced practice provider, you gain a long-term partner in health. Find a Primary Care Provider  Learn more about [facility] in-office and virtual care options: [facility] Get Care Now

## 2022-05-16 NOTE — Progress Notes (Signed)
Virtual Visit Consent   NIL BOLSER, you are scheduled for a virtual visit with a [facility] provider today. Just as with appointments in the office, your consent must be obtained to participate. Your consent will be active for this visit and any virtual visit you may have with one of our providers in the next 365 days. If you have a MyChart account, a copy of this consent can be sent to you electronically.  As this is a virtual visit, video technology does not allow for your provider to perform a traditional examination. This may limit your provider's ability to fully assess your condition. If your provider identifies any concerns that need to be evaluated in person or the need to arrange testing (such as labs, EKG, etc.), we will make arrangements to do so. Although advances in technology are sophisticated, we cannot ensure that it will always work on either your end or our end. If the connection with a video visit is poor, the visit may have to be switched to a telephone visit. With either a video or telephone visit, we are not always able to ensure that we have a secure connection.  By engaging in this virtual visit, you consent to the provision of healthcare and authorize for your insurance to be billed (if applicable) for the services provided during this visit. Depending on your insurance coverage, you may receive a charge related to this service.  I need to obtain your verbal consent now. Are you willing to proceed with your visit today? [name] [name] has provided verbal consent on 05/16/2022 for a virtual visit (video or telephone). Margaretann Loveless, PA-C  Date: 05/16/2022 2:45 PM  Virtual Visit via Video Note   I, Margaretann Loveless, connected with  [name] [name]  ([id number], [id number]) on 05/16/22 at  2:30 PM EST by a video-enabled telemedicine application and verified that I am speaking with the correct person using two identifiers.  Location: Patient: Virtual  Visit Location Patient: Home Provider: Virtual Visit Location Provider: Home Office   I discussed the limitations of evaluation and management by telemedicine and the availability of in person appointments. The patient expressed understanding and agreed to proceed.    History of Present Illness: [name] [name] is a 82 y.o. who identifies as a female who was assigned female at birth, and is being seen today for cough.  HPI: Cough This is a new problem. The current episode started 1 to 4 weeks ago (Saturday 05/07/22). The problem has been gradually worsening. The problem occurs every few minutes. The cough is Productive of sputum and productive of purulent sputum. Associated symptoms include chills, a fever (99.3), headaches, myalgias, nasal congestion (2 days ago started with nasal and head congestion), postnasal drip, rhinorrhea and a sore throat (started with sore throat). Pertinent negatives include no ear congestion, ear pain, shortness of breath or wheezing. The symptoms are aggravated by lying down. She has tried steroid inhaler, ipratropium inhaler, a beta-agonist inhaler, body position changes and OTC cough suppressant (robitussin dm, mucinex) for the symptoms. The treatment provided no relief. Her past medical history is significant for COPD and emphysema.     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   Trigger ring finger of right hand 08/12/2016   Genital herpes 02/16/2016   Trochanteric  bursitis of right hip 07/27/2015   ASCVD (arteriosclerotic cardiovascular disease) 02/05/2015   Rectal bleeding 02/05/2015   Pulmonary nodules 02/05/2015   Glaucoma 01/06/2015   Fibromyalgia 01/06/2015   Elevated cholesterol with elevated triglycerides 01/06/2015   Obstructive sleep apnea on CPAP 01/06/2015   Osteoporosis 01/06/2015    Allergies:  Allergies  Allergen Reactions   Alphagan P [Brimonidine Tartrate]     rash   Netarsudil-Latanoprost Other (See Comments)    Causes eye irritation, eyelid swelling    Codeine Rash   Elemental Sulfur Rash   Penicillins Rash   Medications:  Current Outpatient Medications:    benzonatate (TESSALON) 100 MG capsule, Take 1 capsule (100 mg total) by mouth 3 (three) times daily as needed., Disp: 30 capsule, Rfl: 0   doxycycline (VIBRA-TABS) 100 MG tablet, Take 1 tablet (100 mg total) by mouth 2 (two) times daily., Disp: 20 tablet, Rfl: 0   albuterol (PROAIR HFA) 108 (90 Base) MCG/ACT inhaler, INHALE 2 PUFFS INTO THE LUNGS EVERY 4 (FOUR) HOURS AS NEEDED FOR WHEEZING OR SHORTNESS OF BREATH., Disp: 18 g, Rfl: 2   atorvastatin (LIPITOR) 10 MG tablet, TAKE 1 TABLET BY MOUTH EVERY DAY, Disp: 90 tablet, Rfl: 3   bifidobacterium infantis (ALIGN) capsule, Take 1 capsule by mouth daily., Disp: , Rfl:    Calcium-Phosphorus-Vitamin D (CALCIUM/D3 ADULT GUMMIES PO), Take 2 tablets daily by mouth. 2 gummies daily with meal, Disp: , Rfl:    Cholecalciferol (VITAMIN D PO), Take 2,000 mg by mouth daily., Disp: , Rfl:    DULoxetine (CYMBALTA) 60 MG capsule, TAKE 1 CAPSULE BY MOUTH EVERY DAY, Disp: 90 capsule, Rfl: 1   ipratropium (ATROVENT) 0.06 % nasal spray, USE 2 SPRAYS IN BOTH  NOSTRILS EVERY 8 HOURS AS  NEEDED FOR RHINITIS, Disp: 105 mL, Rfl: 1   levothyroxine (SYNTHROID) 75 MCG tablet, TAKE 1 TABLET BY MOUTH EVERY DAY BEFORE BREAKFAST, Disp: 90 tablet, Rfl: 0   loratadine (CLARITIN) 10 MG tablet, Take 10 mg by mouth daily., Disp: , Rfl:    Menatetrenone  (VITAMIN K2) 100 MCG TABS, Take 1 tablet by mouth daily., Disp: , Rfl:    Multiple Vitamin (MULTIVITAMINS PO), Take by mouth., Disp: , Rfl:    polyethylene glycol (MIRALAX / GLYCOLAX) packet, Take 17 g by mouth as needed. , Disp: , Rfl:    salmeterol (SEREVENT DISKUS) 50 MCG/ACT diskus inhaler, Inhale 1 puff into the lungs 2 (two) times daily., Disp: 3 each, Rfl: 0   SPIRIVA RESPIMAT 2.5 MCG/ACT AERS, INHALE 2 PUFFS BY MOUTH INTO THE LUNGS DAILY, Disp: 12 g, Rfl: 3   Timolol Maleate PF 0.5 % SOLN, Place 1 drop into the left eye in the morning and at bedtime., Disp: , Rfl:    vitamin B-12 (CYANOCOBALAMIN) 100 MCG tablet, Take 100 mcg by mouth daily., Disp: , Rfl:   Observations/Objective: Patient is well-developed, well-nourished in no acute distress.  Resting comfortably at home.  Head is normocephalic, atraumatic.  No labored breathing.  Speech is clear and coherent with logical content.  Patient is alert and oriented at baseline.    Assessment and Plan: 1. Acute bacterial bronchitis - doxycycline (VIBRA-TABS) 100 MG tablet; Take 1 tablet (100  mg total) by mouth 2 (two) times daily.  Dispense: 20 tablet; Refill: 0 - benzonatate (TESSALON) 100 MG capsule; Take 1 capsule (100 mg total) by mouth 3 (three) times daily as needed.  Dispense: 30 capsule; Refill: 0  - Worsening over a week despite OTC medications - Will treat with Doxycycline and tessalon perles - Can continue Mucinex  - Push fluids.  - Rest.  - Steam and humidifier can help - Seek in person evaluation if worsening or symptoms fail to improve    Follow Up Instructions: I discussed the assessment and treatment plan with the patient. The patient was provided an opportunity to ask questions and all were answered. The patient agreed with the plan and demonstrated an understanding of the instructions.  A copy of instructions were sent to the patient via MyChart unless otherwise noted below.    The patient was advised to call  back or seek an in-person evaluation if the symptoms worsen or if the condition fails to improve as anticipated.  Time:  I spent 12 minutes with the patient via telehealth technology discussing the above problems/concerns.    Mar Daring, PA-C

## 2022-05-30 DIAGNOSIS — G4733 Obstructive sleep apnea (adult) (pediatric): Secondary | ICD-10-CM | POA: Diagnosis not present

## 2022-06-04 ENCOUNTER — Other Ambulatory Visit: Payer: Self-pay | Admitting: Family Medicine

## 2022-06-04 DIAGNOSIS — E038 Other specified hypothyroidism: Secondary | ICD-10-CM

## 2022-06-21 DIAGNOSIS — D3131 Benign neoplasm of right choroid: Secondary | ICD-10-CM | POA: Diagnosis not present

## 2022-06-21 DIAGNOSIS — H26493 Other secondary cataract, bilateral: Secondary | ICD-10-CM | POA: Diagnosis not present

## 2022-06-21 DIAGNOSIS — H43813 Vitreous degeneration, bilateral: Secondary | ICD-10-CM | POA: Diagnosis not present

## 2022-06-21 DIAGNOSIS — H401111 Primary open-angle glaucoma, right eye, mild stage: Secondary | ICD-10-CM | POA: Diagnosis not present

## 2022-06-21 DIAGNOSIS — H401122 Primary open-angle glaucoma, left eye, moderate stage: Secondary | ICD-10-CM | POA: Diagnosis not present

## 2022-06-21 DIAGNOSIS — H526 Other disorders of refraction: Secondary | ICD-10-CM | POA: Diagnosis not present

## 2022-06-21 DIAGNOSIS — H18523 Epithelial (juvenile) corneal dystrophy, bilateral: Secondary | ICD-10-CM | POA: Diagnosis not present

## 2022-06-27 DIAGNOSIS — G4733 Obstructive sleep apnea (adult) (pediatric): Secondary | ICD-10-CM | POA: Diagnosis not present

## 2022-06-30 DIAGNOSIS — G4733 Obstructive sleep apnea (adult) (pediatric): Secondary | ICD-10-CM | POA: Diagnosis not present

## 2022-07-26 DIAGNOSIS — G4733 Obstructive sleep apnea (adult) (pediatric): Secondary | ICD-10-CM | POA: Diagnosis not present

## 2022-07-29 DIAGNOSIS — G4733 Obstructive sleep apnea (adult) (pediatric): Secondary | ICD-10-CM | POA: Diagnosis not present

## 2022-08-18 DIAGNOSIS — D2271 Melanocytic nevi of right lower limb, including hip: Secondary | ICD-10-CM | POA: Diagnosis not present

## 2022-08-18 DIAGNOSIS — D2261 Melanocytic nevi of right upper limb, including shoulder: Secondary | ICD-10-CM | POA: Diagnosis not present

## 2022-08-18 DIAGNOSIS — D2272 Melanocytic nevi of left lower limb, including hip: Secondary | ICD-10-CM | POA: Diagnosis not present

## 2022-08-18 DIAGNOSIS — D225 Melanocytic nevi of trunk: Secondary | ICD-10-CM | POA: Diagnosis not present

## 2022-08-18 DIAGNOSIS — L821 Other seborrheic keratosis: Secondary | ICD-10-CM | POA: Diagnosis not present

## 2022-08-18 DIAGNOSIS — L408 Other psoriasis: Secondary | ICD-10-CM | POA: Diagnosis not present

## 2022-08-18 DIAGNOSIS — Z808 Family history of malignant neoplasm of other organs or systems: Secondary | ICD-10-CM | POA: Diagnosis not present

## 2022-08-18 DIAGNOSIS — L7211 Pilar cyst: Secondary | ICD-10-CM | POA: Diagnosis not present

## 2022-08-18 DIAGNOSIS — L578 Other skin changes due to chronic exposure to nonionizing radiation: Secondary | ICD-10-CM | POA: Diagnosis not present

## 2022-08-19 ENCOUNTER — Encounter: Payer: Self-pay | Admitting: Family Medicine

## 2022-08-19 ENCOUNTER — Ambulatory Visit: Payer: Medicare PPO | Admitting: Family Medicine

## 2022-08-19 VITALS — BP 123/74 | HR 62 | Ht 62.0 in | Wt 145.0 lb

## 2022-08-19 DIAGNOSIS — I2510 Atherosclerotic heart disease of native coronary artery without angina pectoris: Secondary | ICD-10-CM | POA: Diagnosis not present

## 2022-08-19 DIAGNOSIS — N1831 Chronic kidney disease, stage 3a: Secondary | ICD-10-CM

## 2022-08-19 DIAGNOSIS — E039 Hypothyroidism, unspecified: Secondary | ICD-10-CM | POA: Diagnosis not present

## 2022-08-19 DIAGNOSIS — M810 Age-related osteoporosis without current pathological fracture: Secondary | ICD-10-CM

## 2022-08-19 DIAGNOSIS — G4733 Obstructive sleep apnea (adult) (pediatric): Secondary | ICD-10-CM

## 2022-08-19 DIAGNOSIS — J432 Centrilobular emphysema: Secondary | ICD-10-CM | POA: Diagnosis not present

## 2022-08-19 DIAGNOSIS — R918 Other nonspecific abnormal finding of lung field: Secondary | ICD-10-CM | POA: Diagnosis not present

## 2022-08-19 DIAGNOSIS — M5416 Radiculopathy, lumbar region: Secondary | ICD-10-CM | POA: Diagnosis not present

## 2022-08-19 MED ORDER — PREDNISONE 20 MG PO TABS: 40.0000 mg | ORAL_TABLET | Freq: Every day | ORAL | 0 refills | Status: DC

## 2022-08-19 MED ORDER — STIOLTO RESPIMAT 2.5-2.5 MCG/ACT IN AERS: 2.0000 | INHALATION_SPRAY | Freq: Every day | RESPIRATORY_TRACT | 3 refills | Status: DC

## 2022-08-19 NOTE — Progress Notes (Signed)
Established Patient Office Visit  Subjective   Patient ID: [name] [name], female    DOB: [birth year]  Age: 82 y.o. MRN: [id number]  Chief Complaint  Patient presents with   Hypothyroidism    HPI  Follow-up emphysema-last saw her in November and we did spirometry at that time I really wanted to switch her Spiriva to [employer].  At the time she had just filled a 90-day supplies for the Spiriva so we added Serevent instead with the plan to switch to [facility] when she ran out.  She still on the separate medication so hopefully we can combine them.  She did have 1 exacerbation in January and did an urgent care visit but did not require hospitalization.  Hypothyroidism - Taking medication regularly in the AM away from food and vitamins, etc. No recent change to skin, hair, or energy levels.  Taking some collagen peptide if it would help with joints.  She has on on it for 2 to 3 weeks and wanted to make sure that it was okay.  Has had some right hip pain.  She says the pain is in the buttock area but it is also over the right outer lateral hip.  Sometimes the pain will actually radiate down towards her outer knee and even past her knee towards her lower leg and foot.    ROS    Objective:     BP 123/74   Pulse 62   Ht 5\' 2"  (1.575 m)   Wt 145 lb (65.8 kg)   SpO2 98%   BMI 26.52 kg/m    Physical Exam Vitals and nursing note reviewed.  Constitutional:      Appearance: She is well-developed.  HENT:     Head: Normocephalic and atraumatic.  Cardiovascular:     Rate and Rhythm: Normal rate and regular rhythm.     Heart sounds: Normal heart sounds.  Pulmonary:     Effort: Pulmonary effort is normal.     Breath sounds: Normal breath sounds.  Musculoskeletal:     Comments: Mildly tender over the right buttock area she is very tender over the right greater trochanter and mildly tender over the left greater trochanter.  Negative straight leg raise bilaterally.  She says it just  feels tight.  Hip, knee, ankle strength is 5 out of 5 bilaterally  Skin:    General: Skin is warm and dry.  Neurological:     Mental Status: She is alert and oriented to person, place, and time.  Psychiatric:        Behavior: Behavior normal.      No results found for any visits on 08/19/22.    The ASCVD Risk score (Arnett DK, et al., 2019) failed to calculate for the following reasons:   The 2019 ASCVD risk score is only valid for ages 82 to 34    Assessment & Plan:   Problem List Items Addressed This Visit       Cardiovascular and Mediastinum   ASCVD (arteriosclerotic cardiovascular disease) (Chronic)    Continue atorvastatin.  Due for lipid check.      Relevant Orders   Lipid Panel w/reflex Direct LDL   COMPLETE METABOLIC PANEL WITH GFR   CBC   TSH     Respiratory   Pulmonary nodules (Chronic)   Obstructive sleep apnea on CPAP (Chronic)   Centrilobular emphysema - Primary    Will go ahead and try to switch the Spiriva to St[employer].  But she is stable and  has not had a flare since January that is around the time she had COVID.      Relevant Medications   predniSONE (DELTASONE) 20 MG tablet   Tiotropium Bromide-Olodaterol (STIOLTO RESPIMAT) 2.5-2.5 MCG/ACT AERS     Endocrine   Hypothyroid    To recheck thyroid level.      Relevant Orders   Lipid Panel w/reflex Direct LDL   COMPLETE METABOLIC PANEL WITH GFR   CBC   TSH     Musculoskeletal and Integument   Osteoporosis (Chronic)   Relevant Orders   Lipid Panel w/reflex Direct LDL   COMPLETE METABOLIC PANEL WITH GFR   CBC   TSH     Genitourinary   CKD (chronic kidney disease), stage III    Continue to follow-up kidney function every 6 months.  Due for urine microalbumin.      Relevant Orders   Lipid Panel w/reflex Direct LDL   COMPLETE METABOLIC PANEL WITH GFR   CBC   TSH   Urine Microalbumin w/creat. ratio   Other Visit Diagnoses     Right lumbar radiculopathy       Relevant Medications    predniSONE (DELTASONE) 20 MG tablet   Other Relevant Orders   Lipid Panel w/reflex Direct LDL   COMPLETE METABOLIC PANEL WITH GFR   CBC   TSH       She has a combination of sciatica on that right side as well as some trochanteric bursitis.  So given handouts for exercises for both and recommend trial of prednisone to see if it provides some relief.  I think she would benefit from formal physical therapy as well.  Return in about 6 months (around 02/18/2023) for thyroid, etc. .    Nani Gasser, MD

## 2022-08-19 NOTE — Assessment & Plan Note (Signed)
To recheck thyroid level. 

## 2022-08-19 NOTE — Assessment & Plan Note (Signed)
Continue atorvastatin.  Due for lipid check.

## 2022-08-19 NOTE — Assessment & Plan Note (Signed)
Will go ahead and try to switch the Spiriva to [employer].  But she is stable and has not had a flare since January that is around the time she had COVID.

## 2022-08-19 NOTE — Assessment & Plan Note (Signed)
Continue to follow-up kidney function every 6 months.  Due for urine microalbumin.

## 2022-08-23 DIAGNOSIS — M810 Age-related osteoporosis without current pathological fracture: Secondary | ICD-10-CM | POA: Diagnosis not present

## 2022-08-23 DIAGNOSIS — E039 Hypothyroidism, unspecified: Secondary | ICD-10-CM | POA: Diagnosis not present

## 2022-08-23 DIAGNOSIS — I2510 Atherosclerotic heart disease of native coronary artery without angina pectoris: Secondary | ICD-10-CM | POA: Diagnosis not present

## 2022-08-23 DIAGNOSIS — N1831 Chronic kidney disease, stage 3a: Secondary | ICD-10-CM | POA: Diagnosis not present

## 2022-08-23 DIAGNOSIS — M5416 Radiculopathy, lumbar region: Secondary | ICD-10-CM | POA: Diagnosis not present

## 2022-08-24 LAB — COMPLETE METABOLIC PANEL WITH GFR
AG Ratio: 1.2 (calc) (ref 1.0–2.5)
ALT: 22 U/L (ref 6–29)
AST: 17 U/L (ref 10–35)
Albumin: 3.9 g/dL (ref 3.6–5.1)
Alkaline phosphatase (APISO): 95 U/L (ref 37–153)
BUN: 19 mg/dL (ref 7–25)
CO2: 29 mmol/L (ref 20–32)
Calcium: 9.1 mg/dL (ref 8.6–10.4)
Chloride: 105 mmol/L (ref 98–110)
Creat: 0.74 mg/dL (ref 0.60–0.95)
Globulin: 3.2 g/dL (calc) (ref 1.9–3.7)
Glucose, Bld: 84 mg/dL (ref 65–99)
Potassium: 3.9 mmol/L (ref 3.5–5.3)
Sodium: 141 mmol/L (ref 135–146)
Total Bilirubin: 0.9 mg/dL (ref 0.2–1.2)
Total Protein: 7.1 g/dL (ref 6.1–8.1)
eGFR: 80 mL/min/{1.73_m2} (ref >=60)

## 2022-08-24 LAB — CBC
HCT: 39.3 % (ref 35.0–45.0)
Hemoglobin: 13.1 g/dL (ref 11.7–15.5)
MCH: 31.4 pg (ref 27.0–33.0)
MCHC: 33.3 g/dL (ref 32.0–36.0)
MCV: 94.2 fL (ref 80.0–100.0)
MPV: 11.8 fL (ref 7.5–12.5)
Platelets: 256 10*3/uL (ref 140–400)
RBC: 4.17 10*6/uL (ref 3.80–5.10)
RDW: 12.6 % (ref 11.0–15.0)
WBC: 7.6 10*3/uL (ref 3.8–10.8)

## 2022-08-24 LAB — MICROALBUMIN / CREATININE URINE RATIO
Creatinine, Urine: 222 mg/dL (ref 20–275)
Microalb Creat Ratio: 6 mg/g creat (ref <30)
Microalb, Ur: 1.3 mg/dL

## 2022-08-24 LAB — LIPID PANEL W/REFLEX DIRECT LDL
Cholesterol: 171 mg/dL (ref <200)
HDL: 74 mg/dL (ref >=50)
LDL Cholesterol (Calc): 79 mg/dL (calc)
Non-HDL Cholesterol (Calc): 97 mg/dL (calc) (ref <130)
Total CHOL/HDL Ratio: 2.3 (calc) (ref <5.0)
Triglycerides: 101 mg/dL (ref <150)

## 2022-08-24 LAB — TSH: TSH: 2.01 mIU/L (ref 0.40–4.50)

## 2022-08-24 NOTE — Progress Notes (Signed)
Your lab work is within acceptable range and there are no concerning findings.   ?

## 2022-08-26 DIAGNOSIS — G4733 Obstructive sleep apnea (adult) (pediatric): Secondary | ICD-10-CM | POA: Diagnosis not present

## 2022-08-29 DIAGNOSIS — G4733 Obstructive sleep apnea (adult) (pediatric): Secondary | ICD-10-CM | POA: Diagnosis not present

## 2022-09-28 DIAGNOSIS — G4733 Obstructive sleep apnea (adult) (pediatric): Secondary | ICD-10-CM | POA: Diagnosis not present

## 2022-11-16 ENCOUNTER — Other Ambulatory Visit: Payer: Self-pay | Admitting: Family Medicine

## 2022-12-06 DIAGNOSIS — F5112 Insufficient sleep syndrome: Secondary | ICD-10-CM | POA: Diagnosis not present

## 2022-12-06 DIAGNOSIS — G4733 Obstructive sleep apnea (adult) (pediatric): Secondary | ICD-10-CM | POA: Diagnosis not present

## 2023-01-12 ENCOUNTER — Other Ambulatory Visit: Payer: Self-pay | Admitting: Family Medicine

## 2023-01-12 DIAGNOSIS — M797 Fibromyalgia: Secondary | ICD-10-CM

## 2023-01-12 DIAGNOSIS — E038 Other specified hypothyroidism: Secondary | ICD-10-CM

## 2023-01-12 DIAGNOSIS — M25551 Pain in right hip: Secondary | ICD-10-CM

## 2023-01-30 DIAGNOSIS — I2510 Atherosclerotic heart disease of native coronary artery without angina pectoris: Secondary | ICD-10-CM | POA: Diagnosis not present

## 2023-01-30 DIAGNOSIS — E039 Hypothyroidism, unspecified: Secondary | ICD-10-CM | POA: Diagnosis not present

## 2023-01-30 DIAGNOSIS — E785 Hyperlipidemia, unspecified: Secondary | ICD-10-CM | POA: Diagnosis not present

## 2023-01-30 DIAGNOSIS — K219 Gastro-esophageal reflux disease without esophagitis: Secondary | ICD-10-CM | POA: Diagnosis not present

## 2023-01-30 DIAGNOSIS — F419 Anxiety disorder, unspecified: Secondary | ICD-10-CM | POA: Diagnosis not present

## 2023-01-30 DIAGNOSIS — J309 Allergic rhinitis, unspecified: Secondary | ICD-10-CM | POA: Diagnosis not present

## 2023-01-30 DIAGNOSIS — M810 Age-related osteoporosis without current pathological fracture: Secondary | ICD-10-CM | POA: Diagnosis not present

## 2023-01-30 DIAGNOSIS — M1990 Unspecified osteoarthritis, unspecified site: Secondary | ICD-10-CM | POA: Diagnosis not present

## 2023-01-30 DIAGNOSIS — G4733 Obstructive sleep apnea (adult) (pediatric): Secondary | ICD-10-CM | POA: Diagnosis not present

## 2023-02-07 ENCOUNTER — Telehealth: Payer: Self-pay | Admitting: General Practice

## 2023-02-07 NOTE — Telephone Encounter (Signed)
Patient had AWV scheduled for today. Patient stated that she completed one with her insurance company nurse as in home visit last week and wanted to cancel today's.   Appt cancelled. She did say she will complete one next year with the office.  Provider notified.

## 2023-02-09 DIAGNOSIS — G4733 Obstructive sleep apnea (adult) (pediatric): Secondary | ICD-10-CM | POA: Diagnosis not present

## 2023-02-20 ENCOUNTER — Other Ambulatory Visit: Payer: Self-pay | Admitting: Family Medicine

## 2023-02-20 ENCOUNTER — Ambulatory Visit: Payer: Medicare PPO | Admitting: Family Medicine

## 2023-02-20 DIAGNOSIS — Z1231 Encounter for screening mammogram for malignant neoplasm of breast: Secondary | ICD-10-CM

## 2023-02-21 ENCOUNTER — Encounter: Payer: Self-pay | Admitting: Family Medicine

## 2023-02-21 ENCOUNTER — Ambulatory Visit: Payer: Medicare PPO | Admitting: Family Medicine

## 2023-02-21 ENCOUNTER — Ambulatory Visit: Payer: Medicare PPO

## 2023-02-21 ENCOUNTER — Telehealth: Payer: Self-pay | Admitting: Family Medicine

## 2023-02-21 VITALS — BP 132/78 | HR 86 | Ht 62.0 in | Wt 146.0 lb

## 2023-02-21 DIAGNOSIS — J432 Centrilobular emphysema: Secondary | ICD-10-CM | POA: Diagnosis not present

## 2023-02-21 DIAGNOSIS — M160 Bilateral primary osteoarthritis of hip: Secondary | ICD-10-CM | POA: Diagnosis not present

## 2023-02-21 DIAGNOSIS — Z23 Encounter for immunization: Secondary | ICD-10-CM

## 2023-02-21 DIAGNOSIS — M25551 Pain in right hip: Secondary | ICD-10-CM

## 2023-02-21 DIAGNOSIS — N1831 Chronic kidney disease, stage 3a: Secondary | ICD-10-CM

## 2023-02-21 DIAGNOSIS — E039 Hypothyroidism, unspecified: Secondary | ICD-10-CM

## 2023-02-21 DIAGNOSIS — M810 Age-related osteoporosis without current pathological fracture: Secondary | ICD-10-CM

## 2023-02-21 DIAGNOSIS — G8929 Other chronic pain: Secondary | ICD-10-CM | POA: Diagnosis not present

## 2023-02-21 DIAGNOSIS — M24151 Other articular cartilage disorders, right hip: Secondary | ICD-10-CM | POA: Diagnosis not present

## 2023-02-21 NOTE — Assessment & Plan Note (Signed)
Use albuterol as needed

## 2023-02-21 NOTE — Telephone Encounter (Signed)
Please look in the vaccine registry to see if we can find her flu and RSV vaccine.  She said she had them done about a week ago at the local pharmacy.

## 2023-02-21 NOTE — Assessment & Plan Note (Signed)
Check vit D levels.  DEXA is up to date.

## 2023-02-21 NOTE — Progress Notes (Signed)
Established Patient Office Visit  Subjective   Patient ID: CHALEE KILLEEN, female    DOB: Jun 08, 1940  Age: 82 y.o. MRN: [id number]  No chief complaint on file.   HPI  Right hip pain-some of the pain radiates from her low buttock area around the hip and down into the outer thigh but sometimes she gets some inner right hip pain as well.  Occasionally it is a grabbing type pain especially when she has been walking for a longer period of time.  It feels better when she is at rest.  Once in a while she will even use a cane because she just feels a little unsteady with it.  COPD-overall she has been doing well she notices some occasional shortness of breath with activity and walking.  She says she is been try to remember to use her albuterol she does not really think about it.  Hypothyroidism - Taking medication regularly in the AM away from food and vitamins, etc. No recent change to skin, hair, or energy levels.      ROS    Objective:     BP 132/78   Pulse 86   Ht 5\' 2"  (1.575 m)   Wt 146 lb (66.2 kg)   SpO2 97%   BMI 26.70 kg/m    Physical Exam Vitals and nursing note reviewed.  Constitutional:      Appearance: Normal appearance.  HENT:     Head: Normocephalic and atraumatic.  Eyes:     Conjunctiva/sclera: Conjunctivae normal.  Cardiovascular:     Rate and Rhythm: Normal rate and regular rhythm.  Pulmonary:     Effort: Pulmonary effort is normal.     Breath sounds: Normal breath sounds.  Skin:    General: Skin is warm and dry.  Neurological:     Mental Status: She is alert.  Psychiatric:        Mood and Affect: Mood normal.      No results found for any visits on 02/21/23.    The ASCVD Risk score (Arnett DK, et al., 2019) failed to calculate for the following reasons:   The 2019 ASCVD risk score is only valid for ages 54 to 15    Assessment & Plan:   Problem List Items Addressed This Visit       Respiratory   Centrilobular emphysema (HCC) -  Primary    Use albuterol as needed.        Endocrine   Hypothyroid   Relevant Orders   TSH   CMP14+EGFR   VITAMIN D 25 Hydroxy (Vit-D Deficiency, Fractures)     Musculoskeletal and Integument   Osteoporosis (Chronic)    Check vit D levels.  DEXA is up to date.        Relevant Orders   TSH   CMP14+EGFR   VITAMIN D 25 Hydroxy (Vit-D Deficiency, Fractures)     Genitourinary   CKD (chronic kidney disease), stage III (HCC)    Follow renal function Q 6 months.       Relevant Orders   TSH   CMP14+EGFR   VITAMIN D 25 Hydroxy (Vit-D Deficiency, Fractures)   Other Visit Diagnoses     Right hip pain       Relevant Orders   DG Hip Unilat W OR W/O Pelvis 2-3 Views Right   Encounter for immunization       Relevant Orders   Pfizer Comirnaty Covid-19 Vaccine 83yrs & older (Completed)      Right hip  pain - will get xrays today since pain has been present for > 6 weeks.  Suspect sever OA.    Return in about 6 months (around 08/22/2023) for COPD, thyroid.    Nani Gasser, MD

## 2023-02-21 NOTE — Assessment & Plan Note (Signed)
Follow renal function Q 6 months.

## 2023-02-22 LAB — CMP14+EGFR
ALT: 18 [IU]/L (ref 0–32)
AST: 21 [IU]/L (ref 0–40)
Albumin: 4.1 g/dL (ref 3.7–4.7)
Alkaline Phosphatase: 124 [IU]/L — ABNORMAL HIGH (ref 44–121)
BUN/Creatinine Ratio: 23 (ref 12–28)
BUN: 17 mg/dL (ref 8–27)
Bilirubin Total: 1.1 mg/dL (ref 0.0–1.2)
CO2: 27 mmol/L (ref 20–29)
Calcium: 9.7 mg/dL (ref 8.7–10.3)
Chloride: 103 mmol/L (ref 96–106)
Creatinine, Ser: 0.74 mg/dL (ref 0.57–1.00)
Globulin, Total: 3.1 g/dL (ref 1.5–4.5)
Glucose: 77 mg/dL (ref 70–99)
Potassium: 4.4 mmol/L (ref 3.5–5.2)
Sodium: 142 mmol/L (ref 134–144)
Total Protein: 7.2 g/dL (ref 6.0–8.5)
eGFR: 80 mL/min/{1.73_m2} (ref >59)

## 2023-02-22 LAB — TSH: TSH: 3.02 u[IU]/mL (ref 0.450–4.500)

## 2023-02-22 LAB — VITAMIN D 25 HYDROXY (VIT D DEFICIENCY, FRACTURES): Vit D, 25-Hydroxy: 65.9 ng/mL (ref 30.0–100.0)

## 2023-02-22 NOTE — Telephone Encounter (Signed)
Patient had both vaccines on 01/29/2023 given at pharmacy per [facility]- added these vaccines to patient chart.

## 2023-02-22 NOTE — Progress Notes (Signed)
Hi [name] [name], your metabolic panel overall looks good.  The alkaline phosphatase which is usually a liver enzyme is up just by couple of points not in a concerning or worrisome range but we will keep an eye on it.  Thyroid looks good at 3.0.  Vitamin D looks great.

## 2023-02-26 IMAGING — MG MM DIGITAL SCREENING BILAT W/ TOMO AND CAD
8 series · 8 of 24 positions shown · non-contrast
Comparison: Previous exam(s).

CLINICAL DATA: Screening.

EXAM:
DIGITAL SCREENING BILATERAL MAMMOGRAM WITH TOMOSYNTHESIS AND CAD
TECHNIQUE: Bilateral screening digital craniocaudal and mediolateral oblique
mammograms were obtained. Bilateral screening digital breast
tomosynthesis was performed. The images were evaluated with
computer-aided detection.

[L CC synth-2D]
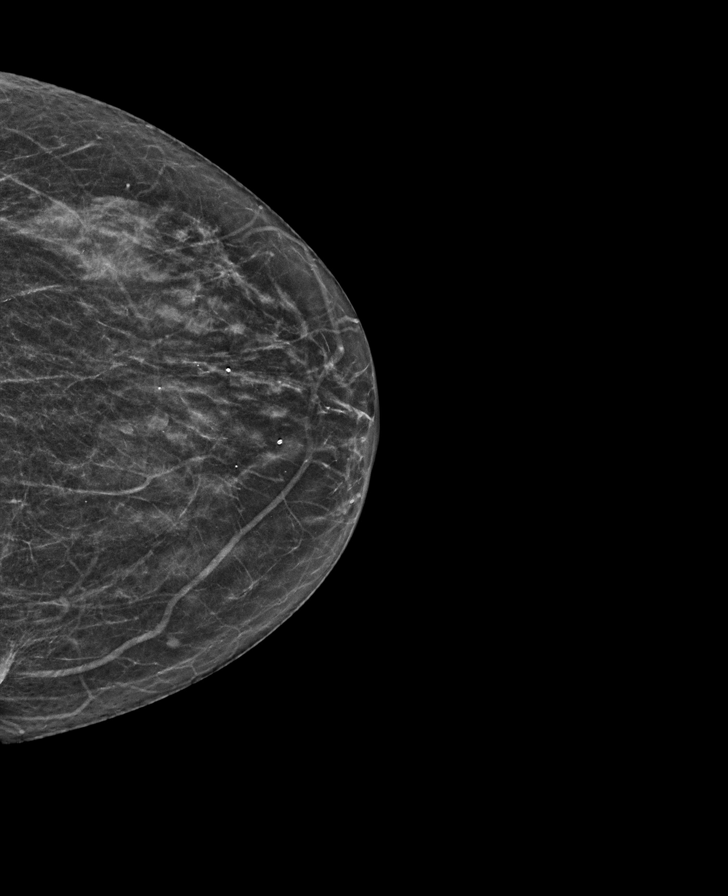

[L MLO synth-2D]
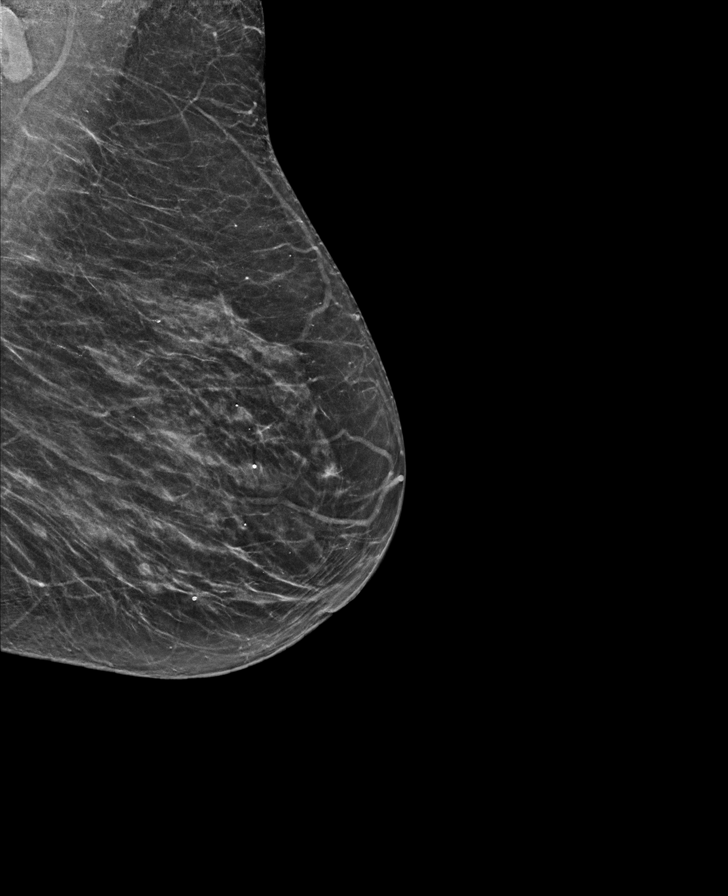

[R CC synth-2D]
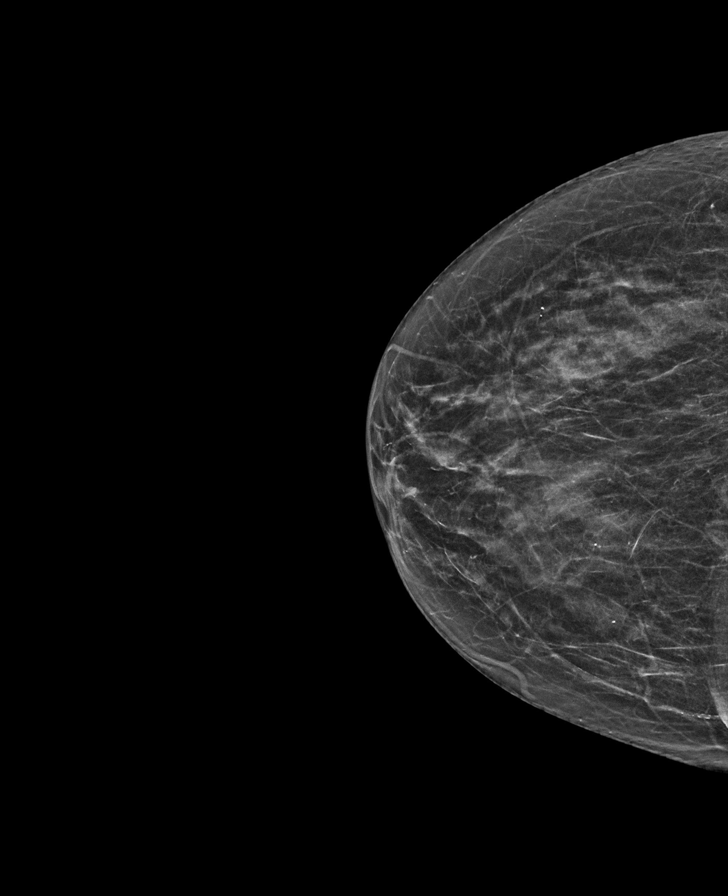

[R MLO synth-2D]
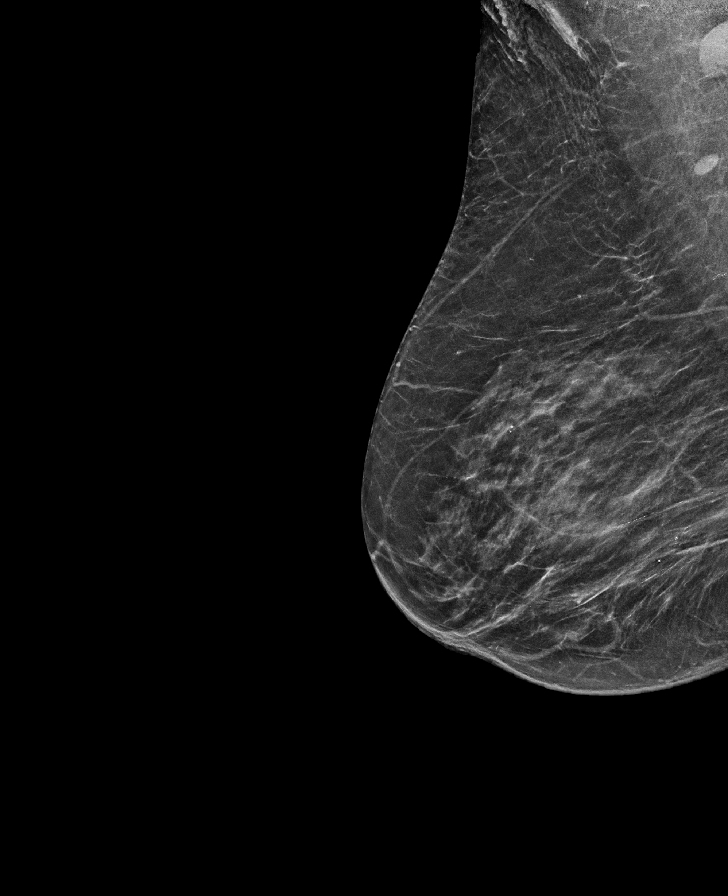

[L MLO tomo · tomo slice 27/53.0]
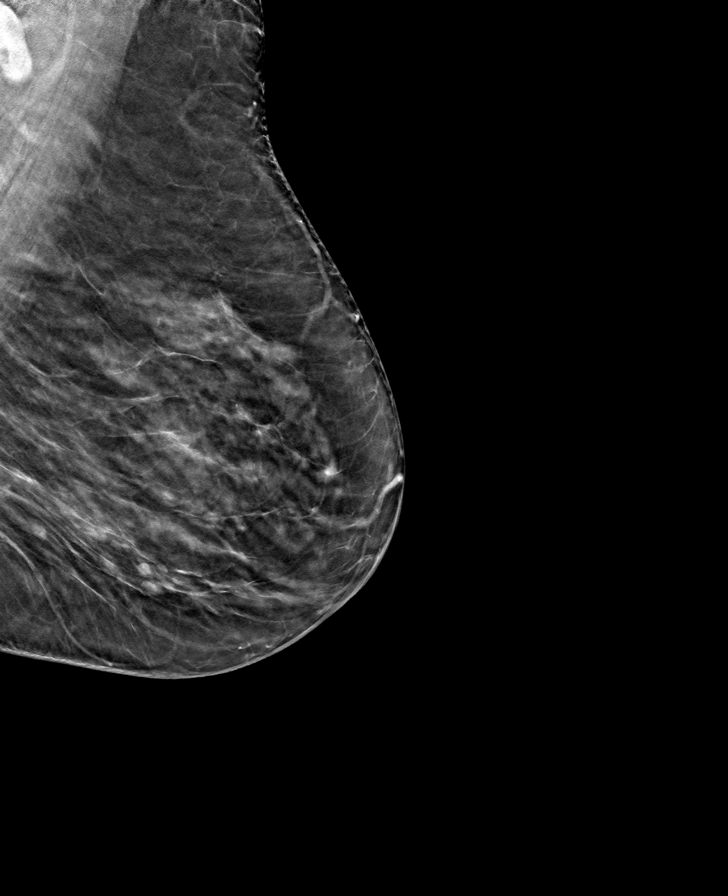

[R CC tomo · tomo slice 25/49.0]
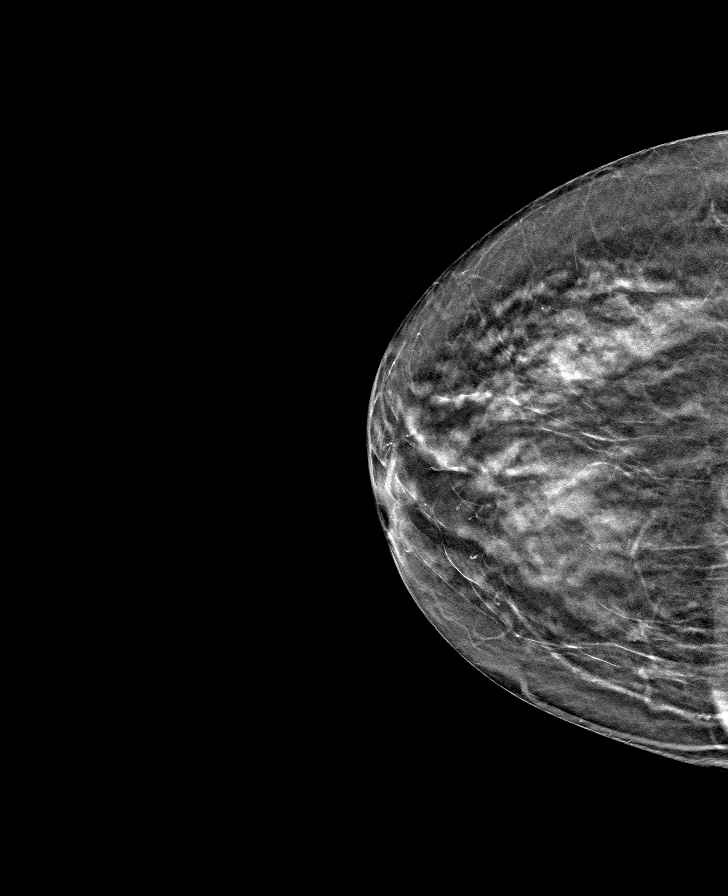

[R MLO tomo · tomo slice 29/56.0]
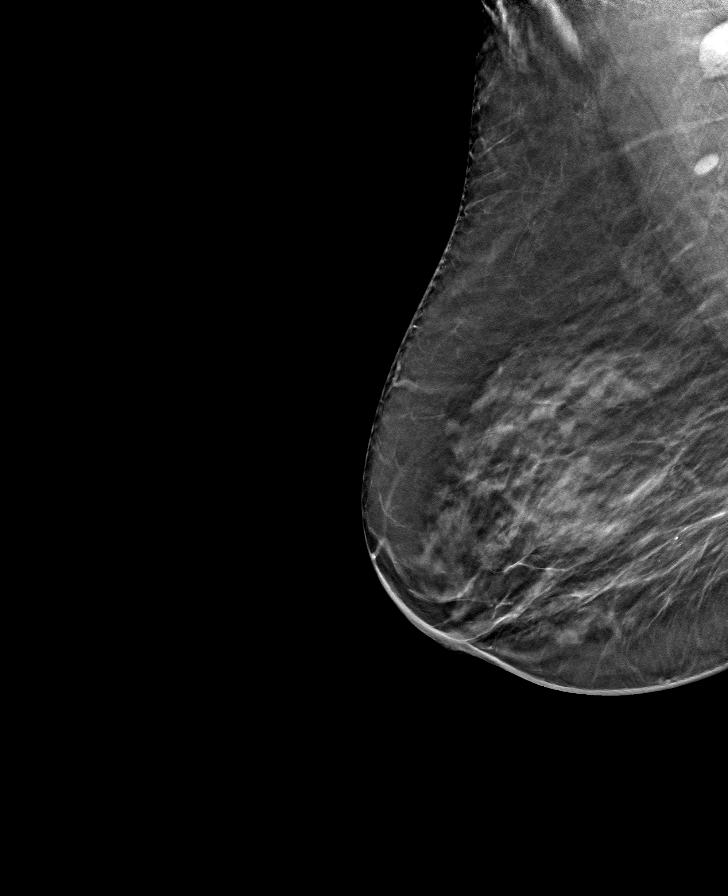

[L CC tomo · tomo slice 25/48.0]
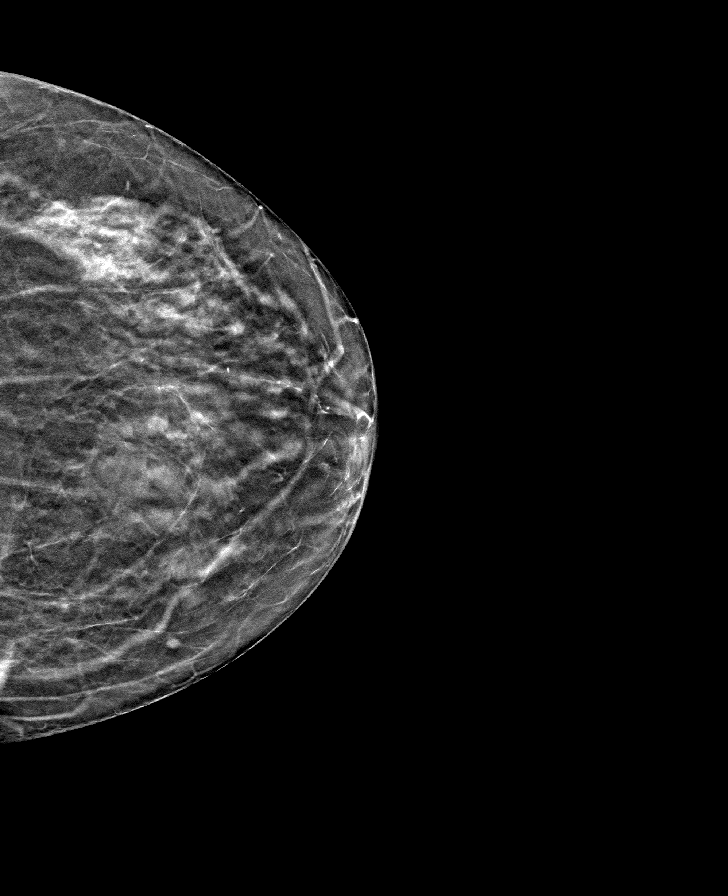

[8 of 24 positions shown; findings below may reference images not displayed]

ACR Breast Density Category c: The breast tissue is heterogeneously
dense, which may obscure small masses.
FINDINGS: There are no findings suspicious for malignancy.
IMPRESSION: No mammographic evidence of malignancy. A result letter of this
screening mammogram will be mailed directly to the patient.

RECOMMENDATION:
Screening mammogram in one year. (Code:[id number])

BI-RADS CATEGORY  1: Negative.

## 2023-02-28 DIAGNOSIS — H401111 Primary open-angle glaucoma, right eye, mild stage: Secondary | ICD-10-CM | POA: Diagnosis not present

## 2023-02-28 DIAGNOSIS — H401122 Primary open-angle glaucoma, left eye, moderate stage: Secondary | ICD-10-CM | POA: Diagnosis not present

## 2023-03-01 ENCOUNTER — Encounter: Payer: Self-pay | Admitting: Family Medicine

## 2023-03-02 NOTE — Telephone Encounter (Signed)
This, please call radiology and see if they can read them today.

## 2023-03-02 NOTE — Progress Notes (Signed)
HI [name] [name], your x-ray shows severe arthritis in that right hip and moderate arthritis in the left.  They noticed that the right hip looks a lot worse compared to the film from 2017.  Are you interested in maybe doing an injection or are you at the point of wanting to see an orthopedic surgeon?  We can get you in with Dr. Karie Schwalbe for an injection if you want to just see if that provides some relief for a while.

## 2023-03-02 NOTE — Telephone Encounter (Signed)
Results now showing in patient chart.

## 2023-03-07 NOTE — Progress Notes (Signed)
Ok to place referral for ortho per her preference

## 2023-03-15 ENCOUNTER — Other Ambulatory Visit: Payer: Self-pay | Admitting: Family Medicine

## 2023-03-16 ENCOUNTER — Other Ambulatory Visit (INDEPENDENT_AMBULATORY_CARE_PROVIDER_SITE_OTHER): Payer: Medicare PPO

## 2023-03-16 ENCOUNTER — Encounter: Payer: Self-pay | Admitting: Sports Medicine

## 2023-03-16 ENCOUNTER — Ambulatory Visit: Payer: Medicare PPO | Admitting: Sports Medicine

## 2023-03-16 DIAGNOSIS — M7061 Trochanteric bursitis, right hip: Secondary | ICD-10-CM

## 2023-03-16 DIAGNOSIS — M1611 Unilateral primary osteoarthritis, right hip: Secondary | ICD-10-CM

## 2023-03-16 MED ORDER — ACETAMINOPHEN ER 650 MG PO TBCR: 650.0000 mg | EXTENDED_RELEASE_TABLET | Freq: Three times a day (TID) | ORAL | Status: AC | PRN

## 2023-03-16 MED ORDER — TRIAMCINOLONE ACETONIDE 40 MG/ML IJ SUSP
40.0000 mg | Freq: Once | INTRAMUSCULAR | Status: AC
  Administered 2023-03-16: 40 mg via INTRAMUSCULAR

## 2023-03-16 NOTE — Progress Notes (Signed)
    Procedures performed today:    Procedure: Real-time Ultrasound Guided injection of the right hip joint Device: Samsung HS60  Verbal informed consent obtained.  Time-out conducted.  Noted no overlying erythema, induration, or other signs of local infection.  Skin prepped in a sterile fashion.  Local anesthesia: Topical Ethyl chloride.  With sterile technique and under real time ultrasound guidance: Arthritic joint noted, 1 cc Kenalog 40, 2 cc lidocaine, 2 cc bupivacaine injected easily Completed without difficulty  Advised to call if fevers/chills, erythema, induration, drainage, or persistent bleeding.  Images permanently stored and available for review in PACS.  Impression: Technically successful ultrasound guided injection.  Independent interpretation of notes and tests performed by another provider:   None.  Brief History, Exam, Impression, and Recommendations:    Primary osteoarthritis of right hip This is a very pleasant 82 year old female, chronic right hip pain, x-rays confirming osteoarthritis, extra strength Tylenol not effective. Reproduction of pain with internal rotation. Today we did a hip joint injection, switching to arthritis Tylenol, adding home physical therapy, return to see me in 6 weeks.    ____________________________________________ [name]. Benjamin Stain, M.D., ABFM., CAQSM., AME. Primary Care and Sports Medicine [facility]  Adjunct Professor of Family Medicine  [employer]  Restaurant manager, fast food

## 2023-03-16 NOTE — Addendum Note (Signed)
Addended by: Carren Rang A on: 03/16/2023 02:36 PM   Modules accepted: Orders

## 2023-03-16 NOTE — Assessment & Plan Note (Signed)
This is a very pleasant 82 year old female, chronic right hip pain, x-rays confirming osteoarthritis, extra strength Tylenol not effective. Reproduction of pain with internal rotation. Today we did a hip joint injection, switching to arthritis Tylenol, adding home physical therapy, return to see me in 6 weeks.

## 2023-03-29 ENCOUNTER — Ambulatory Visit: Payer: Medicare PPO

## 2023-03-29 DIAGNOSIS — Z1231 Encounter for screening mammogram for malignant neoplasm of breast: Secondary | ICD-10-CM | POA: Diagnosis not present

## 2023-03-31 NOTE — Progress Notes (Signed)
Please call patient. Normal mammogram.  Repeat in 1 year.  

## 2023-04-25 ENCOUNTER — Other Ambulatory Visit: Payer: Self-pay | Admitting: Family Medicine

## 2023-04-27 ENCOUNTER — Ambulatory Visit: Payer: Medicare PPO | Admitting: Sports Medicine

## 2023-04-27 DIAGNOSIS — M1611 Unilateral primary osteoarthritis, right hip: Secondary | ICD-10-CM | POA: Diagnosis not present

## 2023-04-27 MED ORDER — TRAMADOL HCL 50 MG PO TABS: 50.0000 mg | ORAL_TABLET | Freq: Three times a day (TID) | ORAL | 0 refills | Status: AC | PRN

## 2023-04-27 NOTE — Progress Notes (Signed)
    Procedures performed today:    None.  Independent interpretation of notes and tests performed by another provider:   None.  Brief History, Exam, Impression, and Recommendations:    Primary osteoarthritis of right hip [name] [name] 82 year old female, chronic right hip pain, x-rays with severe hip osteoarthritis, Tylenol not effective, we injected her hip joint she got several weeks of relief but with unfortunate recurrence of all of her pain. She is only doing Tylenol once daily, she will bump up to 3 times daily and she can add tramadol for breakthrough pain, we will add some formal physical therapy and I would like her to have a consultation with Dr. Luiz Blare.    ____________________________________________ [name]. Benjamin Stain, M.D., ABFM., CAQSM., AME. Primary Care and Sports Medicine [facility]  Adjunct Professor of Family Medicine  [employer]  Restaurant manager, fast food

## 2023-04-27 NOTE — Assessment & Plan Note (Signed)
Pleasant 82 year old female, chronic right hip pain, x-rays with severe hip osteoarthritis, Tylenol not effective, we injected her hip joint she got several weeks of relief but with unfortunate recurrence of all of her pain. She is only doing Tylenol once daily, she will bump up to 3 times daily and she can add tramadol for breakthrough pain, we will add some formal physical therapy and I would like her to have a consultation with Dr. Luiz Blare.

## 2023-05-11 DIAGNOSIS — M1611 Unilateral primary osteoarthritis, right hip: Secondary | ICD-10-CM | POA: Diagnosis not present

## 2023-05-11 DIAGNOSIS — M545 Low back pain, unspecified: Secondary | ICD-10-CM | POA: Diagnosis not present

## 2023-05-15 NOTE — Therapy (Signed)
 OUTPATIENT PHYSICAL THERAPY LOWER EXTREMITY EVALUATION   Patient Name: [name] [name] MRN: [id number] DOB:07/13/1940, 83 y.o., female Today's Date: 05/16/2023  END OF SESSION:  PT End of Session - 05/16/23 1408     Visit Number 1    Number of Visits 12    Date for PT Re-Evaluation 06/27/23    Authorization Type HUMA[employer] MCR    Progress Note Due on Visit 10    PT Start Time 1408    PT Stop Time 1447    PT Time Calculation (min) 39 min    Activity Tolerance Patient tolerated treatment well    Behavior During Therapy WFL for tasks assessed/performed             Past Medical History:  Diagnosis Date   Anxiety    intermittent   blood in stool    CKD (chronic kidney disease), stage III (HCC)    COPD (chronic obstructive pulmonary disease) (HCC)    Diverticulosis    Emphysema lung (HCC)    Fibromyalgia    GERD (gastroesophageal reflux disease)    Glaucoma    Hemorrhoids    HSV-2 (herpes simplex virus 2) infection 2009   Hypothyroidism    OSA on CPAP 07/27/2006   Osteoporosis    Rectal bleeding    with hemorrhoids   Past Surgical History:  Procedure Laterality Date   BUNIONECTOMY  8001,[id number]   CARPAL TUNNEL RELEASE Right 12/08/2016   Procedure: CARPAL TUNNEL RELEASE;  Surgeon: Murrell Kuba, MD;  Location: [facility];  Service: Orthopedics;  Laterality: Right;  REG/FAB   CARPAL TUNNEL RELEASE Left 07/02/2021   Procedure: CARPAL TUNNEL RELEASE LEFT;  Surgeon: Murrell Kuba, MD;  Location: [facility];  Service: Orthopedics;  Laterality: Left;  45 MIN   CERVICAL FUSION  2008   hemorrhoids removed     TRIGGER FINGER RELEASE  2008   Patient Active Problem List   Diagnosis Date Noted   Primary osteoarthritis of left knee 02/11/2020   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   Right arm pain 03/07/2019   Diverticulosis 03/06/2019   CKD (chronic kidney disease), stage III (HCC) 09/06/2018   Allergic rhinitis 08/13/2018   Hypothyroid 02/05/2018    Osteopenia with high risk of fracture 04/26/2017   Bilateral tinnitus 04/25/2017   Restrictive lung disease 04/07/2017   Centrilobular emphysema (HCC) 01/19/2017   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   Trigger ring finger of right hand 08/12/2016   Genital herpes 02/16/2016   Primary osteoarthritis of right hip 07/27/2015   ASCVD (arteriosclerotic cardiovascular disease) 02/05/2015   Rectal bleeding 02/05/2015   Pulmonary nodules 02/05/2015   Glaucoma 01/06/2015   Fibromyalgia 01/06/2015   Elevated cholesterol with elevated triglycerides 01/06/2015   Obstructive sleep apnea on CPAP 01/06/2015   Osteoporosis 01/06/2015    PCP: Alvan Dorothyann BIRCH, MD   REFERRING PROVIDER: Curtis Debby PARAS,*   REFERRING DIAG: F83.88 (ICD-10-CM) - Primary osteoarthritis of right hip   THERAPY DIAG:  Pain in right hip - Plan: PT plan of care cert/re-cert, CANCELED: PT plan of care cert/re-cert  Stiffness of right hip, not elsewhere classified - Plan: PT plan of care cert/re-cert, CANCELED: PT plan of care cert/re-cert  Muscle weakness (generalized) - Plan: PT plan of care cert/re-cert, CANCELED: PT plan of care cert/re-cert  Cramp and spasm - Plan: PT plan of care cert/re-cert, CANCELED: PT plan of care cert/re-cert  Rationale for Evaluation and Treatment: Rehabilitation  ONSET DATE: One year  SUBJECTIVE:  SUBJECTIVE STATEMENT:  patient will be having THA in a few weeks. Can't sleep on side. Hurts to walk, sit to stand. Have been using a cane for two weeks.  PERTINENT HISTORY: OP, neck surgery, L knee meniscus, fibromyalgia PAIN:  Are you having pain? Yes: NPRS scale: 6/10 Pain location: R hip down to ankle med and lateral hip Pain description: ache Aggravating factors: sit to stand Relieving factors: lying on back with legs straight  PRECAUTIONS: Fall  RED FLAGS: None   WEIGHT BEARING RESTRICTIONS: No  FALLS:  Has patient fallen in last 6 months? Yes.  Number of falls 3 fell on the plane with sit to stand, grabbed on to downspout outside and it was loose and fell outside, in the house and the cat cut me off.   LIVING ENVIRONMENT: Lives with: lives alone Lives in: House/apartment Stairs: No Has following equipment at home: Single point cane and Environmental Consultant - 2 wheeled  OCCUPATION: retired  PLOF: Independent  PATIENT GOALS: get around again and clean my own house  NEXT MD VISIT: 05/22/23  OBJECTIVE:  Note: Objective measures were completed at Evaluation unless otherwise noted.  DIAGNOSTIC FINDINGS:  03/02/23 XR IMPRESSION: 1. Severe osteoarthritis of the right hip, substantially progressive from 07/22/2015. 2. Moderate osteoarthritis of the left hip. 3. L4-5 spondylosis.  PATIENT SURVEYS:  FOTO 33 (goal 48)  COGNITION: Overall cognitive status: Within functional limits for tasks assessed     SENSATION: WFL  MUSCLE LENGTH:   POSTURE: rounded shoulders and forward head  PALPATION: Lateral and medial hip/thigh  LOWER EXTREMITY ROM:  Active ROM Right eval Left eval  Hip flexion 90* 108  Hip extension    Hip abduction    Hip adduction    Hip internal rotation 24* 45  Hip external rotation 26* 34  Knee flexion    Knee extension    Ankle dorsiflexion    Ankle plantarflexion    Ankle inversion    Ankle eversion     (Blank rows = not tested)  LOWER EXTREMITY FFU:Hmnddob 5/5 except where indicated  MMT Right eval Left eval  Hip flexion 4* 4  Hip extension    Hip abduction 4-** 5  Hip adduction 4+ 5  Hip internal rotation NT NT  Hip external rotation NT NT  Knee flexion    Knee extension    Ankle dorsiflexion    Ankle plantarflexion    Ankle inversion    Ankle eversion     (Blank rows = not tested)   FUNCTIONAL TESTS:  5 times sit to stand: 17.7 sec with B UE assist Timed up and go (TUG): 15.1 sec  no AD  GAIT: Distance walked: 20 Assistive device utilized: Single point cane Level of assistance:  Modified independence Comments: antalgic, short step/stride length  TREATMENT DATE:   05/16/23 See pt ed and HEP   PATIENT EDUCATION:  Education details: PT eval findings, anticipated POC, initial HEP, and log roll  Person educated: Patient Education method: Explanation, Demonstration, and Handouts Education comprehension: verbalized understanding and returned demonstration  HOME EXERCISE PROGRAM: Access Code: [email or web address] URL: [email or web address] Date: 05/16/2023 Prepared by: Mliss  Exercises - Supine Piriformis Stretch with Leg Straight  - 2 x daily - 7 x weekly - 1 sets - 3 reps - 30 sec hold - Supine Hip Internal and External Rotation  - 1 x daily - 7 x weekly - 2 sets - 10 reps - 5 sec hold - Supine Bridge  - 2 x daily - 7 x weekly - 1-3 sets - 10 reps - Supine Heel Slide with Strap (Mirrored)  - 2 x daily - 7 x weekly - 2 sets - 10 reps - Sitting to Supine Roll  - 1 x daily - 7 x weekly - 1 sets - 10 reps  ASSESSMENT:  CLINICAL IMPRESSION: Patient is a 83 y.o. female who was seen today for physical therapy evaluation and treatment for chronic right hip pain due to OA. She is scheduled to have a THA in the coming weeks. She presently has constant pain that is worse with walking, bed mobility and sit to stand transfers and she has had three falls in the past six months. She uses a cane now for stability. She will benefit from skilled PT to address these deficits.    OBJECTIVE IMPAIRMENTS: decreased activity tolerance, decreased balance, difficulty walking, decreased ROM, decreased strength, increased muscle spasms, impaired flexibility, and pain.   ACTIVITY LIMITATIONS: lifting, sitting, standing, sleeping, stairs, transfers, bed mobility, dressing, and locomotion level  PARTICIPATION LIMITATIONS: cleaning, shopping, and community  activity  PERSONAL FACTORS: Age, Time since onset of injury/illness/exacerbation, and 1 comorbidity: severe OA R hip  are also affecting patient's functional outcome.   REHAB POTENTIAL: Fair she is being scheduled for hip replacement; goal is to strengthen patient to improve outcomes from surgery  CLINICAL DECISION MAKING: Stable/uncomplicated  EVALUATION COMPLEXITY: Low   GOALS: Goals reviewed with patient? Yes  SHORT TERM GOALS: Target date: 05/30/2023   Patient will be independent with initial HEP. Baseline:  Goal status: INITIAL   LONG TERM GOALS: Target date: 06/27/2023   Patient will be independent with advanced/ongoing HEP to improve outcomes and carryover.  Baseline:  Goal status: INITIAL  2.  Patient will report 25% improvement in R hip pain to improve QOL prior to surgery. Baseline:  Goal status: INITIAL  3.  Patient will demonstrate improved functional LE strength as demonstrated by improved 5xSTS by 2-3 seconds. Baseline: 17.7 sec Goal status: INITIAL  4.  Improved LE strength to 4+/5 Baseline:  Goal status: INITIAL  5.  Patient will report improved FOTO to 40 prior to surgery to demonstrate improved functional ability. Baseline: 33 Goal status: INITIAL    PLAN:  PT FREQUENCY: 2x/week  PT DURATION: 6 weeks  PLANNED INTERVENTIONS: 97164- PT Re-evaluation, 97110-Therapeutic exercises, 97530- Therapeutic activity, 97112- Neuromuscular re-education, 97535- Self Care, 02859- Manual therapy, [phone]- Gait training, [phone]- Aquatic Therapy, 97014- Electrical stimulation (unattended), Patient/Family education, Balance training, Stair training, Taping, Dry Needling, Joint mobilization, Cryotherapy, and Moist heat  PLAN FOR NEXT SESSION: Review and progress HEP, gentle R hip ROM, strengthening, gait, balance, manual/DN to R hip musculature   Mliss Cummins, PT  05/16/2023, 4:25 PM  Referring diagnosis? M16.11 Treatment diagnosis? (if different than referring  diagnosis) M25.551,  M25.651, M62.81, R25.2 What was this (referring dx) caused by? []  Surgery [x]  Fall []  Ongoing issue [x]  Arthritis []  Other: ____________  Laterality: [x]  Rt []  Lt []  Both  Check all possible CPT codes:  *CHOOSE 10 OR LESS*    See Planned Interventions listed in the Plan section of the Evaluation.

## 2023-05-16 ENCOUNTER — Other Ambulatory Visit: Payer: Self-pay

## 2023-05-16 ENCOUNTER — Ambulatory Visit: Payer: Medicare PPO | Attending: Sports Medicine | Admitting: Physical Therapy

## 2023-05-16 DIAGNOSIS — R252 Cramp and spasm: Secondary | ICD-10-CM

## 2023-05-16 DIAGNOSIS — M25651 Stiffness of right hip, not elsewhere classified: Secondary | ICD-10-CM | POA: Diagnosis not present

## 2023-05-16 DIAGNOSIS — M1611 Unilateral primary osteoarthritis, right hip: Secondary | ICD-10-CM | POA: Diagnosis not present

## 2023-05-16 DIAGNOSIS — M6281 Muscle weakness (generalized): Secondary | ICD-10-CM | POA: Diagnosis not present

## 2023-05-16 DIAGNOSIS — M25551 Pain in right hip: Secondary | ICD-10-CM

## 2023-05-19 ENCOUNTER — Other Ambulatory Visit: Payer: Self-pay

## 2023-05-19 MED ORDER — TRAMADOL HCL 50 MG PO TABS: 50.0000 mg | ORAL_TABLET | Freq: Three times a day (TID) | ORAL | 0 refills | Status: AC | PRN

## 2023-05-19 NOTE — Telephone Encounter (Signed)
 Pt requesting tramadol refill. Last Rx fill: 04/27/2023 @ 15 tabs Original Rx: Dr. Benjamin Stain

## 2023-05-22 ENCOUNTER — Ambulatory Visit: Payer: Medicare PPO | Admitting: Sports Medicine

## 2023-05-22 DIAGNOSIS — M1611 Unilateral primary osteoarthritis, right hip: Secondary | ICD-10-CM | POA: Diagnosis not present

## 2023-05-22 NOTE — Assessment & Plan Note (Signed)
 This pleasant 83 year old female returns, she has chronic right hip pain with x-rays confirming severe hip osteoarthritis, oral medication such as Tylenol  were not effective, I injected her hip joint with ultrasound guidance late last year, she only got several weeks of relief. We added tramadol  and referred her to Dr. Yvone, she is now scheduled for total hip arthroplasty and needs surgical clearance, she will make this appointment with her PCP, I am happy to refill tramadol  as needed, return as needed.

## 2023-05-22 NOTE — Progress Notes (Signed)
    Procedures performed today:    None.  Independent interpretation of notes and tests performed by another provider:   None.  Brief History, Exam, Impression, and Recommendations:    Primary osteoarthritis of right hip This pleasant 83 year old female returns, she has chronic right hip pain with x-rays confirming severe hip osteoarthritis, oral medication such as Tylenol  were not effective, I injected her hip joint with ultrasound guidance late last year, she only got several weeks of relief. We added tramadol  and referred her to Dr. Yvone, she is now scheduled for total hip arthroplasty and needs surgical clearance, she will make this appointment with her PCP, I am happy to refill tramadol  as needed, return as needed.    ____________________________________________ Debby PARAS. Curtis, M.D., ABFM., CAQSM., AME. Primary Care and Sports Medicine [facility]  Adjunct Professor of [facility] [facility] [employer] [place] [employer]  [employer]  Restaurant Manager, [employer]

## 2023-05-22 NOTE — Therapy (Signed)
 OUTPATIENT PHYSICAL THERAPY LOWER EXTREMITY TREATMENT   Patient Name: [name] [name] MRN: [id number] DOB:Nov 12, 1940, 83 y.o., female Today's Date: 05/23/2023  END OF SESSION:  PT End of Session - 05/23/23 1157     Visit Number 2    Number of Visits 12    Date for PT Re-Evaluation 06/27/23    Authorization Type [employer] MCR    Progress Note Due on Visit 10    PT Start Time 1155    PT Stop Time 1230    PT Time Calculation (min) 35 min    Activity Tolerance Patient tolerated treatment well    Behavior During Therapy WFL for tasks assessed/performed              Past Medical History:  Diagnosis Date   Anxiety    intermittent   blood in stool    CKD (chronic kidney disease), stage III (HCC)    COPD (chronic obstructive pulmonary disease) (HCC)    Diverticulosis    Emphysema lung (HCC)    Fibromyalgia    GERD (gastroesophageal reflux disease)    Glaucoma    Hemorrhoids    HSV-2 (herpes simplex virus 2) infection 2009   Hypothyroidism    OSA on CPAP 07/27/2006   Osteoporosis    Rectal bleeding    with hemorrhoids   Past Surgical History:  Procedure Laterality Date   BUNIONECTOMY  8001,7997   CARPAL TUNNEL RELEASE Right 12/08/2016   Procedure: CARPAL TUNNEL RELEASE;  Surgeon: Murrell Kuba, MD;  Location: [facility];  Service: Orthopedics;  Laterality: Right;  REG/FAB   CARPAL TUNNEL RELEASE Left 07/02/2021   Procedure: CARPAL TUNNEL RELEASE LEFT;  Surgeon: Murrell Kuba, MD;  Location: [facility];  Service: Orthopedics;  Laterality: Left;  45 MIN   CERVICAL FUSION  2008   hemorrhoids removed     TRIGGER FINGER RELEASE  2008   Patient Active Problem List   Diagnosis Date Noted   Primary osteoarthritis of left knee 02/11/2020   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   Right arm pain 03/07/2019   Diverticulosis 03/06/2019   CKD (chronic kidney disease), stage III (HCC) 09/06/2018   Allergic rhinitis 08/13/2018   Hypothyroid 02/05/2018    Osteopenia with high risk of fracture 04/26/2017   Bilateral tinnitus 04/25/2017   Restrictive lung disease 04/07/2017   Centrilobular emphysema (HCC) 01/19/2017   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   Trigger ring finger of right hand 08/12/2016   Genital herpes 02/16/2016   Primary osteoarthritis of right hip 07/27/2015   ASCVD (arteriosclerotic cardiovascular disease) 02/05/2015   Rectal bleeding 02/05/2015   Pulmonary nodules 02/05/2015   Glaucoma 01/06/2015   Fibromyalgia 01/06/2015   Elevated cholesterol with elevated triglycerides 01/06/2015   Obstructive sleep apnea on CPAP 01/06/2015   Osteoporosis 01/06/2015    PCP: Alvan Dorothyann BIRCH, MD   REFERRING PROVIDER: Curtis Debby PARAS,*   REFERRING DIAG: F83.88 (ICD-10-CM) - Primary osteoarthritis of right hip   THERAPY DIAG:  Pain in right hip  Stiffness of right hip, not elsewhere classified  Muscle weakness (generalized)  Cramp and spasm  Rationale for Evaluation and Treatment: Rehabilitation  ONSET DATE: One year  SUBJECTIVE:   SUBJECTIVE STATEMENT: It's been a bad week. Been on my feet a lot. Two of the exercises really hurt.   EVAL:patient will be having THA in a few weeks. Can't sleep on side. Hurts to walk, sit to stand. Have been using a cane for two weeks.  PERTINENT  HISTORY: OP, neck surgery, L knee meniscus, fibromyalgia PAIN:  Are you having pain? Yes: NPRS scale: 8/10 Pain location: R hip down to ankle med and lateral hip Pain description: ache Aggravating factors: sit to stand Relieving factors: lying on back with legs straight  PRECAUTIONS: Fall  RED FLAGS: None   WEIGHT BEARING RESTRICTIONS: No  FALLS:  Has patient fallen in last 6 months? Yes. Number of falls 3 fell on the plane with sit to stand, grabbed on to downspout outside and it was loose and fell outside, in the house and the cat cut me off.   LIVING ENVIRONMENT: Lives with: lives alone Lives in:  House/apartment Stairs: No Has following equipment at home: Single point cane and Environmental Consultant - 2 wheeled  OCCUPATION: retired  PLOF: Independent  PATIENT GOALS: get around again and clean my own house  NEXT MD VISIT: 05/22/23  OBJECTIVE:  Note: Objective measures were completed at Evaluation unless otherwise noted.  DIAGNOSTIC FINDINGS:  03/02/23 XR IMPRESSION: 1. Severe osteoarthritis of the right hip, substantially progressive from 07/22/2015. 2. Moderate osteoarthritis of the left hip. 3. L4-5 spondylosis.  PATIENT SURVEYS:  FOTO 33 (goal 48)  COGNITION: Overall cognitive status: Within functional limits for tasks assessed     SENSATION: WFL  MUSCLE LENGTH:   POSTURE: rounded shoulders and forward head  PALPATION: Lateral and medial hip/thigh  LOWER EXTREMITY ROM:  Active ROM Right eval Left eval  Hip flexion 90* 108  Hip extension    Hip abduction    Hip adduction    Hip internal rotation 24* 45  Hip external rotation 26* 34  Knee flexion    Knee extension    Ankle dorsiflexion    Ankle plantarflexion    Ankle inversion    Ankle eversion     (Blank rows = not tested)  LOWER EXTREMITY FFU:Hmnddob 5/5 except where indicated  MMT Right eval Left eval  Hip flexion 4* 4  Hip extension    Hip abduction 4-** 5  Hip adduction 4+ 5  Hip internal rotation NT NT  Hip external rotation NT NT  Knee flexion    Knee extension    Ankle dorsiflexion    Ankle plantarflexion    Ankle inversion    Ankle eversion     (Blank rows = not tested)   FUNCTIONAL TESTS:  5 times sit to stand: 17.7 sec with B UE assist Timed up and go (TUG): 15.1 sec  no AD  GAIT: Distance walked: 20 Assistive device utilized: Single point cane Level of assistance: Modified independence Comments: antalgic, short step/stride length                                                                                                                                TREATMENT DATE:    05/23/23  Manual: long leg distraction for pain control; also did hip distraction at side but some pinching in joint Bridge x 10 Hooklying  march R x 10 in pain-free zone Hooklying Adductor squeeze 5 sec x 20 Hooklying ABDuctor isometric 10 sec hold x 20 (black band high on thighs) Heel slide with strap X 10 Sit to stand from mat + foam x 2 Advised trying heating pad  05/16/23 See pt ed and HEP   PATIENT EDUCATION:  Education details: PT eval findings, anticipated POC, initial HEP, and log roll  Person educated: Patient Education method: Explanation, Demonstration, and Handouts Education comprehension: verbalized understanding and returned demonstration  HOME EXERCISE PROGRAM: Access Code: [email or web address] URL: [email or web address] Date: 05/16/2023 Prepared by: Mliss  Exercises - Supine Piriformis Stretch with Leg Straight  - 2 x daily - 7 x weekly - 1 sets - 3 reps - 30 sec hold - Supine Hip Internal and External Rotation  - 1 x daily - 7 x weekly - 2 sets - 10 reps - 5 sec hold - Supine Bridge  - 2 x daily - 7 x weekly - 1-3 sets - 10 reps - Supine Heel Slide with Strap (Mirrored)  - 2 x daily - 7 x weekly - 2 sets - 10 reps - Sitting to Supine Roll  - 1 x daily - 7 x weekly - 1 sets - 10 reps  ASSESSMENT:  CLINICAL IMPRESSION: Patient presents with increased pain today. We removed the piriformis stretch and supine hip IR/ER from her HEP due to increased pain. She got some relief from long leg distraction and tolerated isometrics well which were added to HEP. Patient reported decreased pain at end of session.  Eval: Patient is a 83 y.o. female who was seen today for physical therapy evaluation and treatment for chronic right hip pain due to OA. She is scheduled to have a THA in the coming weeks. She presently has constant pain that is worse with walking, bed mobility and sit to stand transfers and she has had three falls in the past six months. She uses a cane now for  stability. She will benefit from skilled PT to address these deficits.    OBJECTIVE IMPAIRMENTS: decreased activity tolerance, decreased balance, difficulty walking, decreased ROM, decreased strength, increased muscle spasms, impaired flexibility, and pain.   ACTIVITY LIMITATIONS: lifting, sitting, standing, sleeping, stairs, transfers, bed mobility, dressing, and locomotion level  PARTICIPATION LIMITATIONS: cleaning, shopping, and community activity  PERSONAL FACTORS: Age, Time since onset of injury/illness/exacerbation, and 1 comorbidity: severe OA R hip  are also affecting patient's functional outcome.   REHAB POTENTIAL: Fair she is being scheduled for hip replacement; goal is to strengthen patient to improve outcomes from surgery  CLINICAL DECISION MAKING: Stable/uncomplicated  EVALUATION COMPLEXITY: Low   GOALS: Goals reviewed with patient? Yes  SHORT TERM GOALS: Target date: 05/30/2023   Patient will be independent with initial HEP. Baseline:  Goal status: INITIAL   LONG TERM GOALS: Target date: 06/27/2023   Patient will be independent with advanced/ongoing HEP to improve outcomes and carryover.  Baseline:  Goal status: INITIAL  2.  Patient will report 25% improvement in R hip pain to improve QOL prior to surgery. Baseline:  Goal status: INITIAL  3.  Patient will demonstrate improved functional LE strength as demonstrated by improved 5xSTS by 2-3 seconds. Baseline: 17.7 sec Goal status: INITIAL  4.  Improved LE strength to 4+/5 Baseline:  Goal status: INITIAL  5.  Patient will report improved FOTO to 40 prior to surgery to demonstrate improved functional ability. Baseline: 33 Goal status: INITIAL    PLAN:  PT FREQUENCY: 2x/week  PT DURATION: 6 weeks  PLANNED INTERVENTIONS: 97164- PT Re-evaluation, 97110-Therapeutic exercises, 97530- Therapeutic activity, [id number]- Neuromuscular re-education, 97535- Self Care, [id number]- Manual therapy, [phone]- Gait training,  [phone]- Aquatic Therapy, 97014- Electrical stimulation (unattended), Patient/Family education, Balance training, Stair training, Taping, Dry Needling, Joint mobilization, Cryotherapy, and Moist heat  PLAN FOR NEXT SESSION: Review and progress HEP, gentle R hip ROM, strengthening, gait, balance, manual/DN to R hip musculature   Mliss Cummins, PT  05/23/2023, 12:53 PM  Referring diagnosis? M16.11 Treatment diagnosis? (if different than referring diagnosis) M25.551, M25.651, M62.81, R25.2 What was this (referring dx) caused by? []  Surgery [x]  Fall []  Ongoing issue [x]  Arthritis []  Other: ____________  Laterality: [x]  Rt []  Lt []  Both  Check all possible CPT codes:  *CHOOSE 10 OR LESS*    See Planned Interventions listed in the Plan section of the Evaluation.

## 2023-05-23 ENCOUNTER — Ambulatory Visit: Payer: Medicare PPO | Admitting: Physical Therapy

## 2023-05-23 ENCOUNTER — Encounter: Payer: Self-pay | Admitting: Physical Therapy

## 2023-05-23 DIAGNOSIS — M25551 Pain in right hip: Secondary | ICD-10-CM

## 2023-05-23 DIAGNOSIS — M1611 Unilateral primary osteoarthritis, right hip: Secondary | ICD-10-CM | POA: Diagnosis not present

## 2023-05-23 DIAGNOSIS — R252 Cramp and spasm: Secondary | ICD-10-CM

## 2023-05-23 DIAGNOSIS — M25651 Stiffness of right hip, not elsewhere classified: Secondary | ICD-10-CM | POA: Diagnosis not present

## 2023-05-23 DIAGNOSIS — M6281 Muscle weakness (generalized): Secondary | ICD-10-CM

## 2023-05-25 ENCOUNTER — Ambulatory Visit: Payer: Medicare PPO

## 2023-05-25 DIAGNOSIS — M25551 Pain in right hip: Secondary | ICD-10-CM | POA: Diagnosis not present

## 2023-05-25 DIAGNOSIS — M6281 Muscle weakness (generalized): Secondary | ICD-10-CM | POA: Diagnosis not present

## 2023-05-25 DIAGNOSIS — M25651 Stiffness of right hip, not elsewhere classified: Secondary | ICD-10-CM | POA: Diagnosis not present

## 2023-05-25 DIAGNOSIS — R252 Cramp and spasm: Secondary | ICD-10-CM

## 2023-05-25 DIAGNOSIS — M1611 Unilateral primary osteoarthritis, right hip: Secondary | ICD-10-CM | POA: Diagnosis not present

## 2023-05-25 NOTE — Therapy (Signed)
OUTPATIENT PHYSICAL THERAPY LOWER EXTREMITY TREATMENT   Patient Name: [name] [name] MRN: [id number] DOB:May 17, 1940, 83 y.o., female Today's Date: 05/25/2023  END OF SESSION:  PT End of Session - 05/25/23 1610     Visit Number 3    Number of Visits 12    Date for PT Re-Evaluation 06/27/23    Authorization Type [employer] MCR    Progress Note Due on Visit 10    PT Start Time 1535    PT Stop Time 1615    PT Time Calculation (min) 40 min    Activity Tolerance Patient tolerated treatment well    Behavior During Therapy WFL for tasks assessed/performed            Past Medical History:  Diagnosis Date   Anxiety    intermittent   blood in stool    CKD (chronic kidney disease), stage III (HCC)    COPD (chronic obstructive pulmonary disease) (HCC)    Diverticulosis    Emphysema lung (HCC)    Fibromyalgia    GERD (gastroesophageal reflux disease)    Glaucoma    Hemorrhoids    HSV-2 (herpes simplex virus 2) infection 2009   Hypothyroidism    OSA on CPAP 07/27/2006   Osteoporosis    Rectal bleeding    with hemorrhoids   Past Surgical History:  Procedure Laterality Date   BUNIONECTOMY  [id number]   CARPAL TUNNEL RELEASE Right 12/08/2016   Procedure: CARPAL TUNNEL RELEASE;  Surgeon: Cindee Salt, MD;  Location: [facility];  Service: Orthopedics;  Laterality: Right;  REG/FAB   CARPAL TUNNEL RELEASE Left 07/02/2021   Procedure: CARPAL TUNNEL RELEASE LEFT;  Surgeon: Cindee Salt, MD;  Location: [facility];  Service: Orthopedics;  Laterality: Left;  45 MIN   CERVICAL FUSION  2008   hemorrhoids removed     TRIGGER FINGER RELEASE  2008   Patient Active Problem List   Diagnosis Date Noted   Primary osteoarthritis of left knee 02/11/2020   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   Right arm pain 03/07/2019   Diverticulosis 03/06/2019   CKD (chronic kidney disease), stage III (HCC) 09/06/2018   Allergic rhinitis 08/13/2018   Hypothyroid 02/05/2018    Osteopenia with high risk of fracture 04/26/2017   Bilateral tinnitus 04/25/2017   Restrictive lung disease 04/07/2017   Centrilobular emphysema (HCC) 01/19/2017   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   Trigger ring finger of right hand 08/12/2016   Genital herpes 02/16/2016   Primary osteoarthritis of right hip 07/27/2015   ASCVD (arteriosclerotic cardiovascular disease) 02/05/2015   Rectal bleeding 02/05/2015   Pulmonary nodules 02/05/2015   Glaucoma 01/06/2015   Fibromyalgia 01/06/2015   Elevated cholesterol with elevated triglycerides 01/06/2015   Obstructive sleep apnea on CPAP 01/06/2015   Osteoporosis 01/06/2015    PCP: Agapito Games, MD   REFERRING PROVIDER: Monica Becton,*   REFERRING DIAG: Y78.29 (ICD-10-CM) - Primary osteoarthritis of right hip   THERAPY DIAG:  Pain in right hip  Stiffness of right hip, not elsewhere classified  Muscle weakness (generalized)  Cramp and spasm  Rationale for Evaluation and Treatment: Rehabilitation  ONSET DATE: One year  SUBJECTIVE:   SUBJECTIVE STATEMENT: The patient returned to the clinic continuing to struggle with R hip pain. She felt the stretching performed last session has been the most helpful thing so far. The R THA surgery is yet to be scheduled, but the patient will be going through her sugical clearance appointments so she  will be ready once they schedule.   EVAL:patient will be having THA in a few weeks. Can't sleep on side. Hurts to walk, sit to stand. Have been using a cane for two weeks.  PERTINENT HISTORY: OP, neck surgery, L knee meniscus, fibromyalgia PAIN:  Are you having pain? Yes: NPRS scale: 8/10 Pain location: R hip down to ankle med and lateral hip Pain description: ache Aggravating factors: sit to stand Relieving factors: lying on back with legs straight  PRECAUTIONS: Fall  RED FLAGS: None   WEIGHT BEARING RESTRICTIONS: No  FALLS:  Has patient fallen in last  6 months? Yes. Number of falls 3 fell on the plane with sit to stand, grabbed on to downspout outside and it was loose and fell outside, in the house and the cat cut me off.   LIVING ENVIRONMENT: Lives with: lives alone Lives in: House/apartment Stairs: No Has following equipment at home: Single point cane and Environmental consultant - 2 wheeled  OCCUPATION: retired  PLOF: Independent  PATIENT GOALS: get around again and clean my own house  NEXT MD VISIT: 05/22/23  OBJECTIVE:  Note: Objective measures were completed at Evaluation unless otherwise noted.  DIAGNOSTIC FINDINGS:  03/02/23 XR IMPRESSION: 1. Severe osteoarthritis of the right hip, substantially progressive from 07/22/2015. 2. Moderate osteoarthritis of the left hip. 3. L4-5 spondylosis.  PATIENT SURVEYS:  FOTO 33 (goal 48)  COGNITION: Overall cognitive status: Within functional limits for tasks assessed     SENSATION: WFL  MUSCLE LENGTH:   POSTURE: rounded shoulders and forward head  PALPATION: Lateral and medial hip/thigh  LOWER EXTREMITY ROM:  Active ROM Right eval Left eval  Hip flexion 90* 108  Hip extension    Hip abduction    Hip adduction    Hip internal rotation 24* 45  Hip external rotation 26* 34  Knee flexion    Knee extension    Ankle dorsiflexion    Ankle plantarflexion    Ankle inversion    Ankle eversion     (Blank rows = not tested)  LOWER EXTREMITY ZOX:WRUEAVW 5/5 except where indicated  MMT Right eval Left eval  Hip flexion 4* 4  Hip extension    Hip abduction 4-** 5  Hip adduction 4+ 5  Hip internal rotation NT NT  Hip external rotation NT NT  Knee flexion    Knee extension    Ankle dorsiflexion    Ankle plantarflexion    Ankle inversion    Ankle eversion     (Blank rows = not tested)   FUNCTIONAL TESTS:  5 times sit to stand: 17.7 sec with B UE assist Timed up and go (TUG): 15.1 sec  no AD  GAIT: Distance walked: 20 Assistive device utilized: Single point cane Level  of assistance: Modified independence Comments: antalgic, short step/stride length  [facility] Adult PT Treatment:                                                DATE: 05/25/2023 Manual Therapy: Hooklying: Alternating long axis traction through R femur with long axis AP glide through R femur L sidelying: Gentle soft tissue mobilization to R posterolateral hip musculature Gentle inferior glides to R greater trochanter Modalities: Hooklying: Hot pack x 10 min R hip  TREATMENT DATE:   05/23/23  Manual: long leg distraction for pain control; also did hip distraction at  side but some pinching in joint Bridge x 10 Hooklying march R x 10 in pain-free zone Hooklying Adductor squeeze 5 sec x 20 Hooklying ABDuctor isometric 10 sec hold x 20 (black band high on thighs) Heel slide with strap X 10 Sit to stand from mat + foam x 2 Advised trying heating pad  05/16/23 See pt ed and HEP   PATIENT EDUCATION:  Education details: PT eval findings, anticipated POC, initial HEP, and log roll  Person educated: Patient Education method: Explanation, Demonstration, and Handouts Education comprehension: verbalized understanding and returned demonstration  HOME EXERCISE PROGRAM: Access Code: [email or web address] URL: [email or web address] Date: 05/16/2023 Prepared by: Raynelle Fanning  Exercises - Supine Piriformis Stretch with Leg Straight  - 2 x daily - 7 x weekly - 1 sets - 3 reps - 30 sec hold - Supine Hip Internal and External Rotation  - 1 x daily - 7 x weekly - 2 sets - 10 reps - 5 sec hold - Supine Bridge  - 2 x daily - 7 x weekly - 1-3 sets - 10 reps - Supine Heel Slide with Strap (Mirrored)  - 2 x daily - 7 x weekly - 2 sets - 10 reps - Sitting to Supine Roll  - 1 x daily - 7 x weekly - 1 sets - 10 reps  ASSESSMENT:  CLINICAL IMPRESSION: Hooklying long axis traction and long axis AP glides were well tolerated and seemed to provide relief. L sidelying was a less comfortable position and patient did  note some paresthesia at the R toes in this position. Physical therapy remains indicated.  Eval: Patient is a 83 y.o. female who was seen today for physical therapy evaluation and treatment for chronic right hip pain due to OA. She is scheduled to have a THA in the coming weeks. She presently has constant pain that is worse with walking, bed mobility and sit to stand transfers and she has had three falls in the past six months. She uses a cane now for stability. She will benefit from skilled PT to address these deficits.    OBJECTIVE IMPAIRMENTS: decreased activity tolerance, decreased balance, difficulty walking, decreased ROM, decreased strength, increased muscle spasms, impaired flexibility, and pain.   ACTIVITY LIMITATIONS: lifting, sitting, standing, sleeping, stairs, transfers, bed mobility, dressing, and locomotion level  PARTICIPATION LIMITATIONS: cleaning, shopping, and community activity  PERSONAL FACTORS: Age, Time since onset of injury/illness/exacerbation, and 1 comorbidity: severe OA R hip  are also affecting patient's functional outcome.   REHAB POTENTIAL: Fair she is being scheduled for hip replacement; goal is to strengthen patient to improve outcomes from surgery  CLINICAL DECISION MAKING: Stable/uncomplicated  EVALUATION COMPLEXITY: Low   GOALS: Goals reviewed with patient? Yes  SHORT TERM GOALS: Target date: 05/30/2023   Patient will be independent with initial HEP. Baseline:  Goal status: INITIAL   LONG TERM GOALS: Target date: 06/27/2023   Patient will be independent with advanced/ongoing HEP to improve outcomes and carryover.  Baseline:  Goal status: INITIAL  2.  Patient will report 25% improvement in R hip pain to improve QOL prior to surgery. Baseline:  Goal status: INITIAL  3.  Patient will demonstrate improved functional LE strength as demonstrated by improved 5xSTS by 2-3 seconds. Baseline: 17.7 sec Goal status: INITIAL  4.  Improved LE strength  to 4+/5 Baseline:  Goal status: INITIAL  5.  Patient will report improved FOTO to 40 prior to surgery to demonstrate improved functional ability. Baseline: 33 Goal status: INITIAL    PLAN:  PT FREQUENCY: 2x/week  PT DURATION: 6 weeks  PLANNED INTERVENTIONS: 97164- PT Re-evaluation, 97110-Therapeutic exercises, 97530- Therapeutic activity, [id number]- Neuromuscular re-education, 97535- Self Care, 16109- Manual therapy, [phone]- Gait training, [phone]- Aquatic Therapy, 97014- Electrical stimulation (unattended), Patient/Family education, Balance training, Stair training, Taping, Dry Needling, Joint mobilization, Cryotherapy, and Moist heat  PLAN FOR NEXT SESSION: Review and progress HEP, gentle R hip ROM, strengthening, gait, balance, manual/DN to R hip musculature   Solon Palm, PT  05/25/2023, 4:11 PM  Referring diagnosis? M16.11 Treatment diagnosis? (if different than referring diagnosis) M25.551, M25.651, M62.81, R25.2 What was this (referring dx) caused by? []  Surgery [x]  Fall []  Ongoing issue [x]  Arthritis []  Other: ____________  Laterality: [x]  Rt []  Lt []  Both  Check all possible CPT codes:  *CHOOSE 10 OR LESS*    See Planned Interventions listed in the Plan section of the Evaluation.

## 2023-05-26 ENCOUNTER — Encounter: Payer: Self-pay | Admitting: Family Medicine

## 2023-05-26 ENCOUNTER — Ambulatory Visit: Payer: Medicare PPO | Admitting: Family Medicine

## 2023-05-26 VITALS — BP 123/67 | HR 83 | Ht 62.0 in | Wt 146.0 lb

## 2023-05-26 DIAGNOSIS — Z01818 Encounter for other preprocedural examination: Secondary | ICD-10-CM | POA: Diagnosis not present

## 2023-05-26 DIAGNOSIS — N1831 Chronic kidney disease, stage 3a: Secondary | ICD-10-CM

## 2023-05-26 DIAGNOSIS — E038 Other specified hypothyroidism: Secondary | ICD-10-CM | POA: Diagnosis not present

## 2023-05-26 MED ORDER — ALBUTEROL SULFATE HFA 108 (90 BASE) MCG/ACT IN AERS: INHALATION_SPRAY | RESPIRATORY_TRACT | 2 refills | Status: AC

## 2023-05-26 NOTE — Progress Notes (Unsigned)
Established Patient Office Visit  Subjective  Patient ID: [name] [name], female    DOB: 03-Sep-1940  Age: 83 y.o. MRN: [id number]  No chief complaint on file.   HPI  83 year old female is here today for preoperative clearance for right hip replacement.  She has a history of centrilobular emphysema, obstructive sleep apnea on CPAP, restrictive lung disease, CKD 3, osteoporosis and hyperlipidemia and hypothyroidism.  She is overall doing well.  She tries to stay as active as she can she has been frustrated because she has not made a look to clean her house because of her right hip.  She has had prior surgeries and has never had any complications or issues with anesthesia.  She denies any recent chest pain or shortness of breath fevers or chills.  She denies any open wounds or rashes.  She is able to walk from the parking lot into our building without significant difficulty minus some hip pain.  Last Tdap was in August 15, 2017.  Normal stress test in 2021.   In addition to her current prescriptions she does take collagen and occasionally CBD Gummies but she has not taken those since she started the tramadol.  She Is not currently on any blood thinners or aspirin or any supplements that should cause blood thinning or bleeding.  {History (Optional):23778}  ROS   Outpatient Encounter Medications as of 05/26/2023  Medication Sig   acetaminophen (TYLENOL) 650 MG CR tablet Take 1 tablet (650 mg total) by mouth every 8 (eight) hours as needed for pain.   atorvastatin (LIPITOR) 10 MG tablet TAKE 1 TABLET BY MOUTH EVERY DAY   bifidobacterium infantis (ALIGN) capsule Take 1 capsule by mouth daily.   Calcium-Phosphorus-Vitamin D (CALCIUM/D3 ADULT GUMMIES PO) Take 2 tablets daily by mouth. 2 gummies daily with meal   Cholecalciferol (VITAMIN D PO) Take 2,000 mg by mouth daily.   DULoxetine (CYMBALTA) 60 MG capsule TAKE 1 CAPSULE BY MOUTH EVERY DAY   levothyroxine (SYNTHROID) 75 MCG tablet  TAKE 1 TABLET BY MOUTH EVERY DAY BEFORE BREAKFAST   loratadine (CLARITIN) 10 MG tablet Take 10 mg by mouth daily.   Menatetrenone (VITAMIN K2) 100 MCG TABS Take 1 tablet by mouth daily.   Multiple Vitamin (MULTIVITAMINS PO) Take by mouth.   polyethylene glycol (MIRALAX / GLYCOLAX) packet Take 17 g by mouth as needed.    Timolol Maleate PF 0.5 % SOLN Place 1 drop into the left eye in the morning and at bedtime.   Tiotropium Bromide-Olodaterol (STIOLTO RESPIMAT) 2.5-2.5 MCG/ACT AERS Inhale 2 puffs into the lungs daily.   traMADol (ULTRAM) 50 MG tablet Take 1 tablet (50 mg total) by mouth every 8 (eight) hours as needed for up to 20 days.   vitamin B-12 (CYANOCOBALAMIN) 100 MCG tablet Take 100 mcg by mouth daily.   [DISCONTINUED] albuterol (VENTOLIN HFA) 108 (90 Base) MCG/ACT inhaler INHALE 2 PUFFS INTO THE LUNGS EVERY 4 HOURS AS NEEDED FOR WHEEZING OR SHORTNESS OF BREATH.   albuterol (VENTOLIN HFA) 108 (90 Base) MCG/ACT inhaler INHALE 2 PUFFS INTO THE LUNGS EVERY 4 HOURS AS NEEDED FOR WHEEZING OR SHORTNESS OF BREATH.   No facility-administered encounter medications on file as of 05/26/2023.    Past Surgical History:  Procedure Laterality Date   BUNIONECTOMY  4098,1191   CARPAL TUNNEL RELEASE Right 12/08/2016   Procedure: CARPAL TUNNEL RELEASE;  Surgeon: Cindee Salt, MD;  Location: [facility];  Service: Orthopedics;  Laterality: Right;  REG/FAB   CARPAL TUNNEL  RELEASE Left 07/02/2021   Procedure: CARPAL TUNNEL RELEASE LEFT;  Surgeon: Cindee Salt, MD;  Location: [facility];  Service: Orthopedics;  Laterality: Left;  45 MIN   CERVICAL FUSION  2008   hemorrhoids removed     TRIGGER FINGER RELEASE  2008   Social History   Socioeconomic History   Marital status: Divorced    Spouse name: [name]   Number of children: 3   Years of education: 16   Highest education level: Bachelor's degree (e.g., BA, AB, BS)  Occupational History   Occupation: Armed forces operational officer     Comment: retired  Tobacco Use   Smoking status: Former    Current packs/day: 0.00    Average packs/day: 2.0 packs/day for 25.0 years (50.0 ttl pk-yrs)    Types: Cigarettes    Start date: 03/07/1956    Quit date: 03/07/1981    Years since quitting: 42.2   Smokeless tobacco: Never  Vaping Use   Vaping status: Never Used  Substance and Sexual Activity   Alcohol use: Yes    Alcohol/week: 0.0 standard drinks of alcohol    Comment: 2 drinks per month socially   Drug use: No   Sexual activity: Not Currently  Other Topics Concern   Not on file  Social History Narrative   Lives alone. She enjoys going to church.    Social Drivers of Corporate investment banker Strain: Low Risk  (05/22/2023)   Overall Financial Resource Strain (CARDIA)    Difficulty of Paying Living Expenses: Not very hard  Food Insecurity: No Food Insecurity (05/22/2023)   Hunger Vital Sign    Worried About Running Out of Food in the Last Year: Never true    Ran Out of Food in the Last Year: Never true  Transportation Needs: No Transportation Needs (05/22/2023)   PRAPARE - Administrator, Civil Service (Medical): No    Lack of Transportation (Non-Medical): No  Physical Activity: Unknown (05/22/2023)   Exercise Vital Sign    Days of Exercise per Week: 0 days    Minutes of Exercise per Session: Not on file  Stress: No Stress Concern Present (05/22/2023)   [employer] of Occupational Health - Occupational Stress Questionnaire    Feeling of Stress : Not at all  Social Connections: Unknown (05/22/2023)   Social Connection and Isolation Panel [NHANES]    Frequency of Communication with Friends and Family: Never    Frequency of Social Gatherings with Friends and Family: Patient declined    Attends Religious Services: More than 4 times per year    Active Member of [employer] or Organizations: Yes    Attends Engineer, structural: More than 4 times per year    Marital Status: Divorced      Objective:      BP 123/67   Pulse 83   Ht 5\' 2"  (1.575 m)   Wt 146 lb (66.2 kg)   SpO2 93%   BMI 26.70 kg/m  {Vitals History (Optional):23777}  Physical Exam Vitals reviewed.  Constitutional:      Appearance: Normal appearance.  HENT:     Head: Normocephalic and atraumatic.     Right Ear: Tympanic membrane, ear canal and external ear normal.     Left Ear: Tympanic membrane, ear canal and external ear normal.     Nose: Nose normal.     Mouth/Throat:     Pharynx: Oropharynx is clear.  Eyes:     Extraocular Movements: Extraocular movements intact.  Conjunctiva/sclera: Conjunctivae normal.     Pupils: Pupils are equal, round, and reactive to light.  Neck:     Thyroid: No thyromegaly.  Cardiovascular:     Rate and Rhythm: Normal rate and regular rhythm.  Pulmonary:     Effort: Pulmonary effort is normal.     Breath sounds: Normal breath sounds.  Abdominal:     General: Bowel sounds are normal.     Palpations: Abdomen is soft.     Tenderness: There is no abdominal tenderness.  Musculoskeletal:        General: No swelling.     Cervical back: Neck supple.  Skin:    General: Skin is warm and dry.  Neurological:     Mental Status: She is alert and oriented to person, place, and time.  Psychiatric:        Mood and Affect: Mood normal.        Behavior: Behavior normal.      No results found for any visits on 05/26/23.  {Labs (Optional):23779}  The ASCVD Risk score (Arnett DK, et al., 2019) failed to calculate for the following reasons:   The 2019 ASCVD risk score is only valid for ages 62 to 83    Assessment & Plan:   Problem List Items Addressed This Visit       Genitourinary   CKD (chronic kidney disease), stage III (HCC)   Relevant Orders   CBC with Differential/Platelet   CMP14+EGFR   Hemoglobin A1c     Other   Pre-operative clearance - Primary   Relevant Orders   CBC with Differential/Platelet   CMP14+EGFR   Hemoglobin A1c   Other Visit Diagnoses        Subclinical hypothyroidism             No follow-ups on file.    Nani Gasser, MD

## 2023-05-26 NOTE — Assessment & Plan Note (Signed)
  No Known cardiac disease.  COPD well-controlled with Stiolto and as needed albuterol. EKG today shows rate of 82 bpm, normal sinus rhythm with some left axis deviation. Will get updated labs today.

## 2023-05-27 LAB — CMP14+EGFR
ALT: 17 [IU]/L (ref 0–32)
AST: 22 [IU]/L (ref 0–40)
Albumin: 4.2 g/dL (ref 3.7–4.7)
Alkaline Phosphatase: 121 [IU]/L (ref 44–121)
BUN/Creatinine Ratio: 21 (ref 12–28)
BUN: 17 mg/dL (ref 8–27)
Bilirubin Total: 0.8 mg/dL (ref 0.0–1.2)
CO2: 24 mmol/L (ref 20–29)
Calcium: 9.8 mg/dL (ref 8.7–10.3)
Chloride: 102 mmol/L (ref 96–106)
Creatinine, Ser: 0.82 mg/dL (ref 0.57–1.00)
Globulin, Total: 2.7 g/dL (ref 1.5–4.5)
Glucose: 86 mg/dL (ref 70–99)
Potassium: 4.3 mmol/L (ref 3.5–5.2)
Sodium: 144 mmol/L (ref 134–144)
Total Protein: 6.9 g/dL (ref 6.0–8.5)
eGFR: 70 mL/min/{1.73_m2} (ref >59)

## 2023-05-27 LAB — HEMOGLOBIN A1C
Est. average glucose Bld gHb Est-mCnc: 111 mg/dL
Hgb A1c MFr Bld: 5.5 % (ref 4.8–5.6)

## 2023-05-27 LAB — CBC WITH DIFFERENTIAL/PLATELET
Basophils Absolute: 0 10*3/uL (ref 0.0–0.2)
Basos: 1 %
EOS (ABSOLUTE): 0.1 10*3/uL (ref 0.0–0.4)
Eos: 3 %
Hematocrit: 39.6 % (ref 34.0–46.6)
Hemoglobin: 12.9 g/dL (ref 11.1–15.9)
Immature Grans (Abs): 0 10*3/uL (ref 0.0–0.1)
Immature Granulocytes: 0 %
Lymphocytes Absolute: 1.6 10*3/uL (ref 0.7–3.1)
Lymphs: 37 %
MCH: 31.9 pg (ref 26.6–33.0)
MCHC: 32.6 g/dL (ref 31.5–35.7)
MCV: 98 fL — ABNORMAL HIGH (ref 79–97)
Monocytes Absolute: 0.6 10*3/uL (ref 0.1–0.9)
Monocytes: 15 %
Neutrophils Absolute: 1.9 10*3/uL (ref 1.4–7.0)
Neutrophils: 44 %
Platelets: 230 10*3/uL (ref 150–450)
RBC: 4.04 x10E6/uL (ref 3.77–5.28)
RDW: 12.5 % (ref 11.7–15.4)
WBC: 4.3 10*3/uL (ref 3.4–10.8)

## 2023-05-29 ENCOUNTER — Encounter: Payer: Self-pay | Admitting: Family Medicine

## 2023-05-29 NOTE — Therapy (Signed)
OUTPATIENT PHYSICAL THERAPY LOWER EXTREMITY TREATMENT   Patient Name: [name] [name] MRN: [id number] DOB:Feb 10, 1941, 83 y.o., female Today's Date: 05/30/2023  END OF SESSION:  PT End of Session - 05/30/23 1537     Visit Number 4    Number of Visits 12    Date for PT Re-Evaluation 06/27/23    Authorization Type [employer] MCR    Progress Note Due on Visit 10    PT Start Time 1537    PT Stop Time 1620    PT Time Calculation (min) 43 min    Activity Tolerance Patient tolerated treatment well    Behavior During Therapy WFL for tasks assessed/performed             Past Medical History:  Diagnosis Date   Anxiety    intermittent   blood in stool    CKD (chronic kidney disease), stage III (HCC)    COPD (chronic obstructive pulmonary disease) (HCC)    Diverticulosis    Emphysema lung (HCC)    Fibromyalgia    GERD (gastroesophageal reflux disease)    Glaucoma    Hemorrhoids    HSV-2 (herpes simplex virus 2) infection 2009   Hypothyroidism    OSA on CPAP 07/27/2006   Osteoporosis    Rectal bleeding    with hemorrhoids   Past Surgical History:  Procedure Laterality Date   BUNIONECTOMY  2952,8413   CARPAL TUNNEL RELEASE Right 12/08/2016   Procedure: CARPAL TUNNEL RELEASE;  Surgeon: Cindee Salt, MD;  Location: [facility];  Service: Orthopedics;  Laterality: Right;  REG/FAB   CARPAL TUNNEL RELEASE Left 07/02/2021   Procedure: CARPAL TUNNEL RELEASE LEFT;  Surgeon: Cindee Salt, MD;  Location: [facility];  Service: Orthopedics;  Laterality: Left;  45 MIN   CERVICAL FUSION  2008   hemorrhoids removed     TRIGGER FINGER RELEASE  2008   Patient Active Problem List   Diagnosis Date Noted   Pre-operative clearance 05/26/2023   Primary osteoarthritis of left knee 02/11/2020   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   Right arm pain 03/07/2019   Diverticulosis 03/06/2019   CKD (chronic kidney disease), stage III (HCC) 09/06/2018   Allergic rhinitis  08/13/2018   Hypothyroid 02/05/2018   Osteopenia with high risk of fracture 04/26/2017   Bilateral tinnitus 04/25/2017   Restrictive lung disease 04/07/2017   Centrilobular emphysema (HCC) 01/19/2017   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   Trigger ring finger of right hand 08/12/2016   Genital herpes 02/16/2016   Primary osteoarthritis of right hip 07/27/2015   ASCVD (arteriosclerotic cardiovascular disease) 02/05/2015   Rectal bleeding 02/05/2015   Pulmonary nodules 02/05/2015   Glaucoma 01/06/2015   Fibromyalgia 01/06/2015   Elevated cholesterol with elevated triglycerides 01/06/2015   Obstructive sleep apnea on CPAP 01/06/2015   Osteoporosis 01/06/2015    PCP: Agapito Games, MD   REFERRING PROVIDER: Monica Becton,*   REFERRING DIAG: K44.01 (ICD-10-CM) - Primary osteoarthritis of right hip   THERAPY DIAG:  Pain in right hip  Stiffness of right hip, not elsewhere classified  Muscle weakness (generalized)  Cramp and spasm  Rationale for Evaluation and Treatment: Rehabilitation  ONSET DATE: One year  SUBJECTIVE:   SUBJECTIVE STATEMENT: Nothing is helping. [facility] is sending medical clearance form [facility] to [facility] to fill out. Then we can get the surgery scheduled.   EVAL:patient will be having THA in a few weeks. Can't sleep on side. Hurts to walk, sit to stand.  Have been using a cane for two weeks.  PERTINENT HISTORY: OP, neck surgery, L knee meniscus, fibromyalgia PAIN:  Are you having pain? Yes: NPRS scale: 8/10 Pain location: R hip down to ankle med and lateral hip Pain description: ache Aggravating factors: sit to stand Relieving factors: lying on back with legs straight  PRECAUTIONS: Fall  RED FLAGS: None   WEIGHT BEARING RESTRICTIONS: No  FALLS:  Has patient fallen in last 6 months? Yes. Number of falls 3 fell on the plane with sit to stand, grabbed on to downspout outside and it was loose and fell outside, in  the house and the cat cut me off.   LIVING ENVIRONMENT: Lives with: lives alone Lives in: House/apartment Stairs: No Has following equipment at home: Single point cane and Environmental consultant - 2 wheeled  OCCUPATION: retired  PLOF: Independent  PATIENT GOALS: get around again and clean my own house  NEXT MD VISIT: 05/22/23  OBJECTIVE:  Note: Objective measures were completed at Evaluation unless otherwise noted.  DIAGNOSTIC FINDINGS:  03/02/23 XR IMPRESSION: 1. Severe osteoarthritis of the right hip, substantially progressive from 07/22/2015. 2. Moderate osteoarthritis of the left hip. 3. L4-5 spondylosis.  PATIENT SURVEYS:  FOTO 33 (goal 48)  COGNITION: Overall cognitive status: Within functional limits for tasks assessed     SENSATION: WFL  MUSCLE LENGTH:   POSTURE: rounded shoulders and forward head  PALPATION: Lateral and medial hip/thigh  LOWER EXTREMITY ROM:  Active ROM Right eval Left eval  Hip flexion 90* 108  Hip extension    Hip abduction    Hip adduction    Hip internal rotation 24* 45  Hip external rotation 26* 34  Knee flexion    Knee extension    Ankle dorsiflexion    Ankle plantarflexion    Ankle inversion    Ankle eversion     (Blank rows = not tested)  LOWER EXTREMITY ZOX:WRUEAVW 5/5 except where indicated  MMT Right eval Left eval  Hip flexion 4* 4  Hip extension    Hip abduction 4-** 5  Hip adduction 4+ 5  Hip internal rotation NT NT  Hip external rotation NT NT  Knee flexion    Knee extension    Ankle dorsiflexion    Ankle plantarflexion    Ankle inversion    Ankle eversion     (Blank rows = not tested)   FUNCTIONAL TESTS:  5 times sit to stand: 17.7 sec with B UE assist Timed up and go (TUG): 15.1 sec  no AD  GAIT: Distance walked: 20 Assistive device utilized: Single point cane Level of assistance: Modified independence Comments: antalgic, short step/stride length   TREATMENT DATE:   05/30/23  Manual: long leg  traction for pain control; pt wanted to try distraction again, but still caused pain Therex: Attempted sidelying hip ABD but pain refers down leg Bridge x 10 Hooklying clam with black band high on thighs 5 sec hold x 20 Bridge with black band x 5 Heel raises at counter x 10 Toe raises x 10 HS curls x 10 B Mini squat at counter x10 Standing on L - hip circles R CW/CCW HEP Update   [facility] Adult PT Treatment:                                                DATE: 05/25/2023 Manual Therapy: Hooklying:  Alternating long axis traction through R femur with long axis AP glide through R femur L sidelying: Gentle soft tissue mobilization to R posterolateral hip musculature Gentle inferior glides to R greater trochanter Modalities: Hooklying: Hot pack x 10 min R hip  TREATMENT DATE:   05/23/23  Manual: long leg distraction for pain control; also did hip distraction at side but some pinching in joint Bridge x 10 Hooklying march R x 10 in pain-free zone Hooklying Adductor squeeze 5 sec x 20 Hooklying ABDuctor isometric 10 sec hold x 20 (black band high on thighs) Heel slide with strap X 10 Sit to stand from mat + foam x 2 Advised trying heating pad  05/16/23 See pt ed and HEP   PATIENT EDUCATION:  Education details: PT eval findings, anticipated POC, initial HEP, and log roll  Person educated: Patient Education method: Explanation, Demonstration, and Handouts Education comprehension: verbalized understanding and returned demonstration  HOME EXERCISE PROGRAM: Access Code: [email or web address] URL: [email or web address] Date: 05/30/2023 Prepared by: Raynelle Fanning  Exercises - Supine Bridge  - 2 x daily - 7 x weekly - 1-3 sets - 10 reps - Supine Heel Slide with Strap (Mirrored)  - 2 x daily - 7 x weekly - 2 sets - 10 reps - Sitting to Supine Roll  - 1 x daily - 7 x weekly - 1 sets - 10 reps - Hooklying Isometric Hip External Rotation  - 1 x daily - 7 x weekly - 1-2 sets - 10 reps - 10 sec  hold - Seated Hip Adduction Isometrics with Ball  - 1 x daily - 7 x weekly - 1-2 sets - 10 reps - 5 hold - Sit to Stand with Armchair  - 2 x daily - 7 x weekly - 1 sets - 10 reps - Heel Raises with Counter Support  - 1 x daily - 3 x weekly - 2 sets - 10 reps - Toe Raises with Counter Support  - 1 x daily - 3 x weekly - 2 sets - 10 reps - Standing Hamstring Curl with Chair Support  - 1 x daily - 3 x weekly - 2 sets - 10 reps - Mini Squat with Counter Support  - 1 x daily - 3 x weekly - 2 sets - 10 reps - Standing Hip Circles  - 1 x daily - 3 x weekly - 2 sets - 10 reps  ASSESSMENT:  CLINICAL IMPRESSION: Patient reports ongoing pain in R hip. She was able to tolerate new standing TE today fairly well. Her most discomfort is from single leg stance on the R, so we minimized this. She gets some relief with long axis traction but continues to report pain with hip distraction so PT will d/c this from now on. She is independent and compliant with HEP performing all exercises to tolerance.   Eval: Patient is a 83 y.o. female who was seen today for physical therapy evaluation and treatment for chronic right hip pain due to OA. She is scheduled to have a THA in the coming weeks. She presently has constant pain that is worse with walking, bed mobility and sit to stand transfers and she has had three falls in the past six months. She uses a cane now for stability. She will benefit from skilled PT to address these deficits.    OBJECTIVE IMPAIRMENTS: decreased activity tolerance, decreased balance, difficulty walking, decreased ROM, decreased strength, increased muscle spasms, impaired flexibility, and pain.   ACTIVITY LIMITATIONS: lifting, sitting, standing, sleeping, stairs, transfers,  bed mobility, dressing, and locomotion level  PARTICIPATION LIMITATIONS: cleaning, shopping, and community activity  PERSONAL FACTORS: Age, Time since onset of injury/illness/exacerbation, and 1 comorbidity: severe OA R hip   are also affecting patient's functional outcome.   REHAB POTENTIAL: Fair she is being scheduled for hip replacement; goal is to strengthen patient to improve outcomes from surgery  CLINICAL DECISION MAKING: Stable/uncomplicated  EVALUATION COMPLEXITY: Low   GOALS: Goals reviewed with patient? Yes  SHORT TERM GOALS: Target date: 05/30/2023   Patient will be independent with initial HEP. Baseline:  Goal status: MET   LONG TERM GOALS: Target date: 06/27/2023   Patient will be independent with advanced/ongoing HEP to improve outcomes and carryover.  Baseline:  Goal status: INITIAL  2.  Patient will report 25% improvement in R hip pain to improve QOL prior to surgery. Baseline:  Goal status: INITIAL  3.  Patient will demonstrate improved functional LE strength as demonstrated by improved 5xSTS by 2-3 seconds. Baseline: 17.7 sec Goal status: INITIAL  4.  Improved LE strength to 4+/5 Baseline:  Goal status: INITIAL  5.  Patient will report improved FOTO to 40 prior to surgery to demonstrate improved functional ability. Baseline: 33 Goal status: INITIAL    PLAN:  PT FREQUENCY: 2x/week  PT DURATION: 6 weeks  PLANNED INTERVENTIONS: 97164- PT Re-evaluation, 97110-Therapeutic exercises, 97530- Therapeutic activity, [id number]- Neuromuscular re-education, 97535- Self Care, 57846- Manual therapy, [id number]- Gait training, [phone]- Aquatic Therapy, 97014- Electrical stimulation (unattended), Patient/Family education, Balance training, Stair training, Taping, Dry Needling, Joint mobilization, Cryotherapy, and Moist heat  PLAN FOR NEXT SESSION: continue with LE strengthening, long axis traction as needed.   Solon Palm, PT  05/30/2023, 4:30 PM  Referring diagnosis? M16.11 Treatment diagnosis? (if different than referring diagnosis) M25.551, M25.651, M62.81, R25.2 What was this (referring dx) caused by? []  Surgery [x]  Fall []  Ongoing issue [x]  Arthritis []  Other:  ____________  Laterality: [x]  Rt []  Lt []  Both  Check all possible CPT codes:  *CHOOSE 10 OR LESS*    See Planned Interventions listed in the Plan section of the Evaluation.

## 2023-05-29 NOTE — Progress Notes (Signed)
Hi Cody, overall your labs look great.

## 2023-05-30 ENCOUNTER — Ambulatory Visit: Payer: Medicare PPO | Admitting: Physical Therapy

## 2023-05-30 ENCOUNTER — Encounter: Payer: Self-pay | Admitting: Physical Therapy

## 2023-05-30 DIAGNOSIS — M25551 Pain in right hip: Secondary | ICD-10-CM

## 2023-05-30 DIAGNOSIS — M25651 Stiffness of right hip, not elsewhere classified: Secondary | ICD-10-CM

## 2023-05-30 DIAGNOSIS — M1611 Unilateral primary osteoarthritis, right hip: Secondary | ICD-10-CM | POA: Diagnosis not present

## 2023-05-30 DIAGNOSIS — M6281 Muscle weakness (generalized): Secondary | ICD-10-CM

## 2023-05-30 DIAGNOSIS — R252 Cramp and spasm: Secondary | ICD-10-CM

## 2023-05-31 ENCOUNTER — Telehealth: Payer: Self-pay

## 2023-05-31 NOTE — Telephone Encounter (Signed)
Copied from CRM [id number]. Topic: Clinical - Medical Advice >> May 30, 2023  9:14 AM Nila Nephew wrote: Reason for CRM: Patient's surgical office calling because they received an incomplete surgical clearance form. Question regarding waiting period needs to be filled out or called in to number listed as contact here.

## 2023-05-31 NOTE — Telephone Encounter (Signed)
Okay, I understand.  I do not technically know when her knee replacement will be.  They had not scheduled her yet but assuming they want to do it after the hip replacement then I would say about 3 weeks out.

## 2023-05-31 NOTE — Telephone Encounter (Signed)
They state the time after hip surgery is usually around 6 months before dental procedures. They wanted me to confirm the time frame. They also wanted to know once she has the hip surgery and waits the time will she need pre-med before dental procedures?

## 2023-05-31 NOTE — Telephone Encounter (Signed)
Natalie with the dental office states they need to know if patient needs to hold dental procedures after hip replacement? If so how long?

## 2023-05-31 NOTE — Telephone Encounter (Signed)
What waiting periods?

## 2023-06-01 ENCOUNTER — Ambulatory Visit: Payer: Medicare PPO

## 2023-06-01 ENCOUNTER — Telehealth: Payer: Self-pay

## 2023-06-01 DIAGNOSIS — M1611 Unilateral primary osteoarthritis, right hip: Secondary | ICD-10-CM | POA: Diagnosis not present

## 2023-06-01 DIAGNOSIS — M25651 Stiffness of right hip, not elsewhere classified: Secondary | ICD-10-CM

## 2023-06-01 DIAGNOSIS — M6281 Muscle weakness (generalized): Secondary | ICD-10-CM | POA: Diagnosis not present

## 2023-06-01 DIAGNOSIS — M25551 Pain in right hip: Secondary | ICD-10-CM | POA: Diagnosis not present

## 2023-06-01 DIAGNOSIS — R252 Cramp and spasm: Secondary | ICD-10-CM

## 2023-06-01 NOTE — Telephone Encounter (Signed)
Copied from CRM [id number]. Topic: General - Other >> Jun 01, 2023  2:58 PM [name] [name] wrote: Reason for CRM: The patient was returning a call from the office to go over her lab results. The patient is aware of her labs but did want to make sure that Dr. Linford Arnold is aware that her hip surgery has been scheduled for 2/25.

## 2023-06-01 NOTE — Therapy (Signed)
OUTPATIENT PHYSICAL THERAPY LOWER EXTREMITY TREATMENT   Patient Name: [name] [name] MRN: [id number] DOB:[birth year], 83 y.o., female Today's Date: 06/01/2023  END OF SESSION:  PT End of Session - 06/01/23 1403     Visit Number 5    Number of Visits 12    Date for PT Re-Evaluation 06/27/23    Authorization Type HU[employer] MCR    Authorization Time Period 12 VISITS APPROVED FOR PT 05/16/2023-06/27/2023    Authorization - Visit Number 5    Authorization - Number of Visits 12    PT Start Time 1405    PT Stop Time 1445    PT Time Calculation (min) 40 min    Activity Tolerance Patient tolerated treatment well    Behavior During Therapy WFL for tasks assessed/performed            Past Medical History:  Diagnosis Date   Anxiety    intermittent   blood in stool    CKD (chronic kidney disease), stage III (HCC)    COPD (chronic obstructive pulmonary disease) (HCC)    Diverticulosis    Emphysema lung (HCC)    Fibromyalgia    GERD (gastroesophageal reflux disease)    Glaucoma    Hemorrhoids    HSV-2 (herpes simplex virus 2) infection 2009   Hypothyroidism    OSA on CPAP 07/27/2006   Osteoporosis    Rectal bleeding    with hemorrhoids   Past Surgical History:  Procedure Laterality Date   BUNIONECTOMY  4098,1191   CARPAL TUNNEL RELEASE Right 12/08/2016   Procedure: CARPAL TUNNEL RELEASE;  Surgeon: Cindee Salt, MD;  Location: [facility];  Service: Orthopedics;  Laterality: Right;  REG/FAB   CARPAL TUNNEL RELEASE Left 07/02/2021   Procedure: CARPAL TUNNEL RELEASE LEFT;  Surgeon: Cindee Salt, MD;  Location: [facility];  Service: Orthopedics;  Laterality: Left;  45 MIN   CERVICAL FUSION  2008   hemorrhoids removed     TRIGGER FINGER RELEASE  2008   Patient Active Problem List   Diagnosis Date Noted   Pre-operative clearance 05/26/2023   Primary osteoarthritis of left knee 02/11/2020   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   Right arm pain  03/07/2019   Diverticulosis 03/06/2019   CKD (chronic kidney disease), stage III (HCC) 09/06/2018   Allergic rhinitis 08/13/2018   Hypothyroid 02/05/2018   Osteopenia with high risk of fracture 04/26/2017   Bilateral tinnitus 04/25/2017   Restrictive lung disease 04/07/2017   Centrilobular emphysema (HCC) 01/19/2017   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   Trigger ring finger of right hand 08/12/2016   Genital herpes 02/16/2016   Primary osteoarthritis of right hip 07/27/2015   ASCVD (arteriosclerotic cardiovascular disease) 02/05/2015   Rectal bleeding 02/05/2015   Pulmonary nodules 02/05/2015   Glaucoma 01/06/2015   Fibromyalgia 01/06/2015   Elevated cholesterol with elevated triglycerides 01/06/2015   Obstructive sleep apnea on CPAP 01/06/2015   Osteoporosis 01/06/2015    PCP: Agapito Games, MD   REFERRING PROVIDER: Monica Becton,*   REFERRING DIAG: Y78.29 (ICD-10-CM) - Primary osteoarthritis of right hip   THERAPY DIAG:  Pain in right hip  Stiffness of right hip, not elsewhere classified  Muscle weakness (generalized)  Cramp and spasm  Rationale for Evaluation and Treatment: Rehabilitation  ONSET DATE: One year  SUBJECTIVE:   SUBJECTIVE STATEMENT: Patient reports she has been scheduled on 07/04/23 for R THA. Patient states 8/10 pain in hip today.    EVAL: patient will be  having THA in a few weeks. Can't sleep on side. Hurts to walk, sit to stand. Have been using a cane for two weeks.  PERTINENT HISTORY: OP, neck surgery, L knee meniscus, fibromyalgia PAIN:  Are you having pain? Yes: NPRS scale: 8/10 Pain location: R hip down to ankle med and lateral hip Pain description: ache Aggravating factors: sit to stand Relieving factors: lying on back with legs straight  PRECAUTIONS: Fall  RED FLAGS: None   WEIGHT BEARING RESTRICTIONS: No  FALLS:  Has patient fallen in last 6 months? Yes. Number of falls 3 fell on the plane  with sit to stand, grabbed on to downspout outside and it was loose and fell outside, in the house and the cat cut me off.   LIVING ENVIRONMENT: Lives with: lives alone Lives in: House/apartment Stairs: No Has following equipment at home: Single point cane and Environmental consultant - 2 wheeled  OCCUPATION: retired  PLOF: Independent  PATIENT GOALS: get around again and clean my own house  NEXT MD VISIT: 06/20/23  OBJECTIVE:  Note: Objective measures were completed at Evaluation unless otherwise noted.  DIAGNOSTIC FINDINGS:  03/02/23 XR IMPRESSION: 1. Severe osteoarthritis of the right hip, substantially progressive from 07/22/2015. 2. Moderate osteoarthritis of the left hip. 3. L4-5 spondylosis.  PATIENT SURVEYS:  FOTO 33 (goal 48)  COGNITION: Overall cognitive status: Within functional limits for tasks assessed     SENSATION: WFL  MUSCLE LENGTH:   POSTURE: rounded shoulders and forward head  PALPATION: Lateral and medial hip/thigh  LOWER EXTREMITY ROM:  Active ROM Right eval Left eval  Hip flexion 90* 108  Hip extension    Hip abduction    Hip adduction    Hip internal rotation 24* 45  Hip external rotation 26* 34  Knee flexion    Knee extension    Ankle dorsiflexion    Ankle plantarflexion    Ankle inversion    Ankle eversion     (Blank rows = not tested)  LOWER EXTREMITY BJY:NWGNFAO 5/5 except where indicated  MMT Right eval Left eval  Hip flexion 4* 4  Hip extension    Hip abduction 4-** 5  Hip adduction 4+ 5  Hip internal rotation NT NT  Hip external rotation NT NT  Knee flexion    Knee extension    Ankle dorsiflexion    Ankle plantarflexion    Ankle inversion    Ankle eversion     (Blank rows = not tested)   FUNCTIONAL TESTS:  5 times sit to stand: 17.7 sec with B UE assist Timed up and go (TUG): 15.1 sec  no AD  GAIT: Distance walked: 20 Assistive device utilized: Single point cane Level of assistance: Modified independence Comments:  antalgic, short step/stride length  [facility] Adult PT Treatment:                                                DATE: 06/01/2023 Therapeutic Exercise: Henreitta Leber 2x15 Hooklying hip ER isometric + black TB Seated hip add isometric with ball squeeze b/w knees 2x10x5"  STS on airex, no HHA (chair in front if needed) 2x10 Standing: HS curls x15 (B) Toe raises x15 Hip circles CW/CCW Mini squat x15    TREATMENT DATE:   05/30/23  Manual: long leg traction for pain control; pt wanted to try distraction again, but still caused pain Therex: Attempted  sidelying hip ABD but pain refers down leg Bridge x 10 Hooklying clam with black band high on thighs 5 sec hold x 20 Bridge with black band x 5 Heel raises at counter x 10 Toe raises x 10 HS curls x 10 B Mini squat at counter x10 Standing on L - hip circles R CW/CCW HEP Update   [facility] Adult PT Treatment:                                                DATE: 05/25/2023 Manual Therapy: Hooklying: Alternating long axis traction through R femur with long axis AP glide through R femur L sidelying: Gentle soft tissue mobilization to R posterolateral hip musculature Gentle inferior glides to R greater trochanter Modalities: Hooklying: Hot pack x 10 min R hip   PATIENT EDUCATION:  Education details: PT eval findings, anticipated POC, initial HEP, and log roll  Person educated: Patient Education method: Explanation, Demonstration, and Handouts Education comprehension: verbalized understanding and returned demonstration  HOME EXERCISE PROGRAM: Access Code: [email or web address] URL: [email or web address] Date: 05/30/2023 Prepared by: Raynelle Fanning  Exercises - Supine Bridge  - 2 x daily - 7 x weekly - 1-3 sets - 10 reps - Supine Heel Slide with Strap (Mirrored)  - 2 x daily - 7 x weekly - 2 sets - 10 reps - Sitting to Supine Roll  - 1 x daily - 7 x weekly - 1 sets - 10 reps - Hooklying Isometric Hip External Rotation  - 1 x daily - 7 x weekly -  1-2 sets - 10 reps - 10 sec hold - Seated Hip Adduction Isometrics with Ball  - 1 x daily - 7 x weekly - 1-2 sets - 10 reps - 5 hold - Sit to Stand with Armchair  - 2 x daily - 7 x weekly - 1 sets - 10 reps - Heel Raises with Counter Support  - 1 x daily - 3 x weekly - 2 sets - 10 reps - Toe Raises with Counter Support  - 1 x daily - 3 x weekly - 2 sets - 10 reps - Standing Hamstring Curl with Chair Support  - 1 x daily - 3 x weekly - 2 sets - 10 reps - Mini Squat with Counter Support  - 1 x daily - 3 x weekly - 2 sets - 10 reps - Standing Hip Circles  - 1 x daily - 3 x weekly - 2 sets - 10 reps  ASSESSMENT:  CLINICAL IMPRESSION: HEP reviewed and provided cues and modifications as needed to address pain/discomfort. Eccentric control and stability improved by slightly elevating seat height.   Eval: Patient is a 83 y.o. female who was seen today for physical therapy evaluation and treatment for chronic right hip pain due to OA. She is scheduled to have a THA in the coming weeks. She presently has constant pain that is worse with walking, bed mobility and sit to stand transfers and she has had three falls in the past six months. She uses a cane now for stability. She will benefit from skilled PT to address these deficits.    OBJECTIVE IMPAIRMENTS: decreased activity tolerance, decreased balance, difficulty walking, decreased ROM, decreased strength, increased muscle spasms, impaired flexibility, and pain.   ACTIVITY LIMITATIONS: lifting, sitting, standing, sleeping, stairs, transfers, bed mobility, dressing, and locomotion level  PARTICIPATION LIMITATIONS: cleaning, shopping,  and community activity  PERSONAL FACTORS: Age, Time since onset of injury/illness/exacerbation, and 1 comorbidity: severe OA R hip  are also affecting patient's functional outcome.   REHAB POTENTIAL: Fair she is being scheduled for hip replacement; goal is to strengthen patient to improve outcomes from surgery  CLINICAL  DECISION MAKING: Stable/uncomplicated  EVALUATION COMPLEXITY: Low   GOALS: Goals reviewed with patient? Yes  SHORT TERM GOALS: Target date: 05/30/2023   Patient will be independent with initial HEP. Baseline:  Goal status: MET   LONG TERM GOALS: Target date: 06/27/2023   Patient will be independent with advanced/ongoing HEP to improve outcomes and carryover.  Baseline:  Goal status: INITIAL  2.  Patient will report 25% improvement in R hip pain to improve QOL prior to surgery. Baseline:  Goal status: INITIAL  3.  Patient will demonstrate improved functional LE strength as demonstrated by improved 5xSTS by 2-3 seconds. Baseline: 17.7 sec Goal status: INITIAL  4.  Improved LE strength to 4+/5 Baseline:  Goal status: INITIAL  5.  Patient will report improved FOTO to 40 prior to surgery to demonstrate improved functional ability. Baseline: 33 Goal status: INITIAL    PLAN:  PT FREQUENCY: 2x/week  PT DURATION: 6 weeks  PLANNED INTERVENTIONS: 97164- PT Re-evaluation, 97110-Therapeutic exercises, 97530- Therapeutic activity, [id number]- Neuromuscular re-education, 97535- Self Care, 66063- Manual therapy, [id number]- Gait training, [phone]- Aquatic Therapy, 97014- Electrical stimulation (unattended), Patient/Family education, Balance training, Stair training, Taping, Dry Needling, Joint mobilization, Cryotherapy, and Moist heat  PLAN FOR NEXT SESSION: continue with LE strengthening, long axis traction as needed. (THA scheduled 07/04/23)   Carlynn Herald, PTA 06/01/2023, 2:45 PM

## 2023-06-02 NOTE — Telephone Encounter (Signed)
The office is closed today. I will try again on Monday.

## 2023-06-02 NOTE — Telephone Encounter (Signed)
We finally have a surgery date of 2/25. Do they want to do her dental work before that? If not then we can wait 6 months after surgery.  We can plan to prophylax before dental procedure since in the first year after hip replacement

## 2023-06-05 NOTE — Therapy (Signed)
OUTPATIENT PHYSICAL THERAPY LOWER EXTREMITY TREATMENT   Patient Name: [name] [name] MRN: [id number] DOB:June 16, 1940, 83 y.o., female Today's Date: 06/06/2023  END OF SESSION:  PT End of Session - 06/06/23 1405     Visit Number 6    Number of Visits 12    Date for PT Re-Evaluation 06/27/23    Authorization Type HU[employer] MCR    Authorization Time Period 12 VISITS APPROVED FOR PT 05/16/2023-06/27/2023    Authorization - Visit Number 6    Authorization - Number of Visits 12    Progress Note Due on Visit 10    PT Start Time 1405    PT Stop Time 1444    PT Time Calculation (min) 39 min    Activity Tolerance Patient tolerated treatment well    Behavior During Therapy WFL for tasks assessed/performed             Past Medical History:  Diagnosis Date   Anxiety    intermittent   blood in stool    CKD (chronic kidney disease), stage III (HCC)    COPD (chronic obstructive pulmonary disease) (HCC)    Diverticulosis    Emphysema lung (HCC)    Fibromyalgia    GERD (gastroesophageal reflux disease)    Glaucoma    Hemorrhoids    HSV-2 (herpes simplex virus 2) infection 2009   Hypothyroidism    OSA on CPAP 07/27/2006   Osteoporosis    Rectal bleeding    with hemorrhoids   Past Surgical History:  Procedure Laterality Date   BUNIONECTOMY  4098,1191   CARPAL TUNNEL RELEASE Right 12/08/2016   Procedure: CARPAL TUNNEL RELEASE;  Surgeon: Cindee Salt, MD;  Location: [facility];  Service: Orthopedics;  Laterality: Right;  REG/FAB   CARPAL TUNNEL RELEASE Left 07/02/2021   Procedure: CARPAL TUNNEL RELEASE LEFT;  Surgeon: Cindee Salt, MD;  Location: [facility];  Service: Orthopedics;  Laterality: Left;  45 MIN   CERVICAL FUSION  2008   hemorrhoids removed     TRIGGER FINGER RELEASE  2008   Patient Active Problem List   Diagnosis Date Noted   Pre-operative clearance 05/26/2023   Primary osteoarthritis of left knee 02/11/2020   Cervical fusion syndrome  05/24/2019   Right arm pain 03/07/2019   Diverticulosis 03/06/2019   CKD (chronic kidney disease), stage III (HCC) 09/06/2018   Allergic rhinitis 08/13/2018   Hypothyroid 02/05/2018   Osteopenia with high risk of fracture 04/26/2017   Bilateral tinnitus 04/25/2017   Restrictive lung disease 04/07/2017   Centrilobular emphysema (HCC) 01/19/2017   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   Trigger ring finger of right hand 08/12/2016   Genital herpes 02/16/2016   Primary osteoarthritis of right hip 07/27/2015   ASCVD (arteriosclerotic cardiovascular disease) 02/05/2015   Rectal bleeding 02/05/2015   Pulmonary nodules 02/05/2015   Glaucoma 01/06/2015   Fibromyalgia 01/06/2015   Elevated cholesterol with elevated triglycerides 01/06/2015   Obstructive sleep apnea on CPAP 01/06/2015   Osteoporosis 01/06/2015    PCP: Agapito Games, MD   REFERRING PROVIDER: Monica Becton,*   REFERRING DIAG: Y78.29 (ICD-10-CM) - Primary osteoarthritis of right hip   THERAPY DIAG:  Pain in right hip  Stiffness of right hip, not elsewhere classified  Muscle weakness (generalized)  Cramp and spasm  Rationale for Evaluation and Treatment: Rehabilitation  ONSET DATE: One year  SUBJECTIVE:   SUBJECTIVE STATEMENT: Pain is the same, but I can lift by leg easier to put my shoes  and socks on.   EVAL: patient will be having THA in a few weeks. Can't sleep on side. Hurts to walk, sit to stand. Have been using a cane for two weeks.  PERTINENT HISTORY: OP, neck surgery, L knee meniscus, fibromyalgia PAIN:  Are you having pain? Yes: NPRS scale: 8/10 Pain location: R hip down to ankle med and lateral hip Pain description: ache Aggravating factors: sit to stand Relieving factors: lying on back with legs straight  PRECAUTIONS: Fall  RED FLAGS: None   WEIGHT BEARING RESTRICTIONS: No  FALLS:  Has patient fallen in last 6 months? Yes. Number of falls 3 fell on the  plane with sit to stand, grabbed on to downspout outside and it was loose and fell outside, in the house and the cat cut me off.   LIVING ENVIRONMENT: Lives with: lives alone Lives in: House/apartment Stairs: No Has following equipment at home: Single point cane and Environmental consultant - 2 wheeled  OCCUPATION: retired  PLOF: Independent  PATIENT GOALS: get around again and clean my own house  NEXT MD VISIT: 06/20/23  OBJECTIVE:  Note: Objective measures were completed at Evaluation unless otherwise noted.  DIAGNOSTIC FINDINGS:  03/02/23 XR IMPRESSION: 1. Severe osteoarthritis of the right hip, substantially progressive from 07/22/2015. 2. Moderate osteoarthritis of the left hip. 3. L4-5 spondylosis.  PATIENT SURVEYS:  FOTO 33 (goal 48)  COGNITION: Overall cognitive status: Within functional limits for tasks assessed     SENSATION: WFL  MUSCLE LENGTH:   POSTURE: rounded shoulders and forward head  PALPATION: Lateral and medial hip/thigh  LOWER EXTREMITY ROM:  Active ROM Right eval Left eval Right  1/28  Hip flexion 90* 108 101  Hip extension     Hip abduction     Hip adduction     Hip internal rotation 24* 45 22*  Hip external rotation 26* 34 24*  Knee flexion     Knee extension     Ankle dorsiflexion     Ankle plantarflexion     Ankle inversion     Ankle eversion      (Blank rows = not tested)  LOWER EXTREMITY JXB:[id number] 5/5 except where indicated  MMT Right eval Left eval  Hip flexion 4* 4  Hip extension    Hip abduction 4-** 5  Hip adduction 4+ 5  Hip internal rotation NT NT  Hip external rotation NT NT  Knee flexion    Knee extension    Ankle dorsiflexion    Ankle plantarflexion    Ankle inversion    Ankle eversion     (Blank rows = not tested)   FUNCTIONAL TESTS:  5 times sit to stand: 17.7 sec with B UE assist Timed up and go (TUG): 15.1 sec  no AD 06/06/23 5XSTS  with B UE assist 16 seconds  GAIT: Distance walked: 20 Assistive device  utilized: Single point cane Level of assistance: Modified independence Comments: antalgic, short step/stride length  [facility] Adult PT Treatment:                                                DATE: 06/03/2023 Therapeutic Exercise: Henreitta Leber 2x15 Hooklying hip ER isometric + black TB Seated hip add isometric with ball squeeze b/w knees 2x10x5"  STS 2x5 with B UE support for goal assessment STS on airex, no HHA (chair in front if needed) 2x5  Standing: HS curls 2 x 10 (B) Toe raises x 20, unilateral toe raises x 20 Hip circles CW/CCW x 10 ea B Mini squat x15 Calf stretch at chair - 10x dynamically then hold for 30 sec   [facility] Adult PT Treatment:                                                DATE: 06/01/2023 Therapeutic Exercise: Henreitta Leber 2x15 Hooklying hip ER isometric + black TB Seated hip add isometric with ball squeeze b/w knees 2x10x5"  STS on airex, no HHA (chair in front if needed) 2x10 Standing: HS curls x15 (B) Toe raises x15 Hip circles CW/CCW Mini squat x15  TREATMENT DATE:   05/30/23  Manual: long leg traction for pain control; pt wanted to try distraction again, but still caused pain Therex: Attempted sidelying hip ABD but pain refers down leg Bridge x 10 Hooklying clam with black band high on thighs 5 sec hold x 20 Bridge with black band x 5 Heel raises at counter x 10 Toe raises x 10 HS curls x 10 B Mini squat at counter x10 Standing on L - hip circles R CW/CCW HEP Update   [facility] Adult PT Treatment:                                                DATE: 05/25/2023 Manual Therapy: Hooklying: Alternating long axis traction through R femur with long axis AP glide through R femur L sidelying: Gentle soft tissue mobilization to R posterolateral hip musculature Gentle inferior glides to R greater trochanter Modalities: Hooklying: Hot pack x 10 min R hip   PATIENT EDUCATION:  Education details: PT eval findings, anticipated POC, initial HEP, and log roll  Person  educated: Patient Education method: Explanation, Demonstration, and Handouts Education comprehension: verbalized understanding and returned demonstration  HOME EXERCISE PROGRAM: Access Code: [email or web address] URL: [email or web address] Date: 06/06/2023 Prepared by: Raynelle Fanning  Exercises - Supine Bridge  - 2 x daily - 7 x weekly - 1-3 sets - 10 reps - Supine Heel Slide with Strap (Mirrored)  - 2 x daily - 7 x weekly - 2 sets - 10 reps - Sitting to Supine Roll  - 1 x daily - 7 x weekly - 1 sets - 10 reps - Hooklying Isometric Hip External Rotation  - 1 x daily - 7 x weekly - 1-2 sets - 10 reps - 10 sec hold - Seated Hip Adduction Isometrics with Ball  - 1 x daily - 7 x weekly - 1-2 sets - 10 reps - 5 hold - Sit to Stand with Armchair  - 2 x daily - 7 x weekly - 1 sets - 10 reps - Heel Raises with Counter Support  - 1 x daily - 3 x weekly - 2 sets - 10 reps - Toe Raises with Counter Support  - 1 x daily - 3 x weekly - 2 sets - 10 reps - Standing Hamstring Curl with Chair Support  - 1 x daily - 3 x weekly - 2 sets - 10 reps - Mini Squat with Counter Support  - 1 x daily - 3 x weekly - 2 sets - 10 reps - Standing Hip Circles  - 1  x daily - 3 x weekly - 2 sets - 10 reps - Standing Gastroc Stretch  - 2 x daily - 7 x weekly - 3 reps - 30s hold  ASSESSMENT:  CLINICAL IMPRESSION: Xayla has progressed with active R hip flexion by 11 degrees and only feels a pulling now instead of pain. Her IR and ER have not changed and still remain very painful. She demonstrated tight gastrocs with toe raises so stretch issued for HEP. She continues to be compliant with HEP.   Eval: Patient is a 83 y.o. female who was seen today for physical therapy evaluation and treatment for chronic right hip pain due to OA. She is scheduled to have a THA in the coming weeks. She presently has constant pain that is worse with walking, bed mobility and sit to stand transfers and she has had three falls in the past six months.  She uses a cane now for stability. She will benefit from skilled PT to address these deficits.    OBJECTIVE IMPAIRMENTS: decreased activity tolerance, decreased balance, difficulty walking, decreased ROM, decreased strength, increased muscle spasms, impaired flexibility, and pain.   ACTIVITY LIMITATIONS: lifting, sitting, standing, sleeping, stairs, transfers, bed mobility, dressing, and locomotion level  PARTICIPATION LIMITATIONS: cleaning, shopping, and community activity  PERSONAL FACTORS: Age, Time since onset of injury/illness/exacerbation, and 1 comorbidity: severe OA R hip  are also affecting patient's functional outcome.   REHAB POTENTIAL: Fair she is being scheduled for hip replacement; goal is to strengthen patient to improve outcomes from surgery  CLINICAL DECISION MAKING: Stable/uncomplicated  EVALUATION COMPLEXITY: Low   GOALS: Goals reviewed with patient? Yes  SHORT TERM GOALS: Target date: 05/30/2023   Patient will be independent with initial HEP. Baseline:  Goal status: MET   LONG TERM GOALS: Target date: 06/27/2023   Patient will be independent with advanced/ongoing HEP to improve outcomes and carryover.  Baseline:  Goal status: INITIAL  2.  Patient will report 25% improvement in R hip pain to improve QOL prior to surgery. Baseline:  Goal status: INITIAL  3.  Patient will demonstrate improved functional LE strength as demonstrated by improved 5xSTS by 2-3 seconds. Baseline: 17.7 sec, 1/28 16 sec  Goal status: IN PROGRESS  4.  Improved LE strength to 4+/5 Baseline:  Goal status: INITIAL  5.  Patient will report improved FOTO to 40 prior to surgery to demonstrate improved functional ability. Baseline: 33 Goal status: INITIAL    PLAN:  PT FREQUENCY: 2x/week  PT DURATION: 6 weeks  PLANNED INTERVENTIONS: 97164- PT Re-evaluation, 97110-Therapeutic exercises, 97530- Therapeutic activity, [id number]- Neuromuscular re-education, 97535- Self Care, [id number]-  Manual therapy, [id number]- Gait training, [phone]- Aquatic Therapy, 97014- Electrical stimulation (unattended), Patient/Family education, Balance training, Stair training, Taping, Dry Needling, Joint mobilization, Cryotherapy, and Moist heat  PLAN FOR NEXT SESSION: continue with LE strengthening, long axis traction as needed. (THA scheduled 07/04/23)   Solon Palm, PT  06/06/2023, 2:44 PM

## 2023-06-06 ENCOUNTER — Ambulatory Visit: Payer: Medicare PPO | Admitting: Physical Therapy

## 2023-06-06 ENCOUNTER — Encounter: Payer: Self-pay | Admitting: Physical Therapy

## 2023-06-06 DIAGNOSIS — M6281 Muscle weakness (generalized): Secondary | ICD-10-CM

## 2023-06-06 DIAGNOSIS — M1611 Unilateral primary osteoarthritis, right hip: Secondary | ICD-10-CM | POA: Diagnosis not present

## 2023-06-06 DIAGNOSIS — M25551 Pain in right hip: Secondary | ICD-10-CM

## 2023-06-06 DIAGNOSIS — R252 Cramp and spasm: Secondary | ICD-10-CM

## 2023-06-06 DIAGNOSIS — M25651 Stiffness of right hip, not elsewhere classified: Secondary | ICD-10-CM | POA: Diagnosis not present

## 2023-06-07 NOTE — Therapy (Signed)
OUTPATIENT PHYSICAL THERAPY LOWER EXTREMITY TREATMENT   Patient Name: [name] [name] MRN: [id number] DOB:07-20-1940, 83 y.o., female Today's Date: 06/08/2023  END OF SESSION:  PT End of Session - 06/08/23 1407     Visit Number 7    Number of Visits 12    Date for PT Re-Evaluation 06/27/23    Authorization Type [employer] MCR    Authorization Time Period 12 VISITS APPROVED FOR PT 05/16/2023-06/27/2023    Authorization - Visit Number 7    Authorization - Number of Visits 12    Progress Note Due on Visit 10    PT Start Time 1406    PT Stop Time 1444    PT Time Calculation (min) 38 min    Activity Tolerance Patient tolerated treatment well    Behavior During Therapy WFL for tasks assessed/performed              Past Medical History:  Diagnosis Date   Anxiety    intermittent   blood in stool    CKD (chronic kidney disease), stage III (HCC)    COPD (chronic obstructive pulmonary disease) (HCC)    Diverticulosis    Emphysema lung (HCC)    Fibromyalgia    GERD (gastroesophageal reflux disease)    Glaucoma    Hemorrhoids    HSV-2 (herpes simplex virus 2) infection 2009   Hypothyroidism    OSA on CPAP 07/27/2006   Osteoporosis    Rectal bleeding    with hemorrhoids   Past Surgical History:  Procedure Laterality Date   BUNIONECTOMY  4098,1191   CARPAL TUNNEL RELEASE Right 12/08/2016   Procedure: CARPAL TUNNEL RELEASE;  Surgeon: Cindee Salt, MD;  Location: [facility];  Service: Orthopedics;  Laterality: Right;  REG/FAB   CARPAL TUNNEL RELEASE Left 07/02/2021   Procedure: CARPAL TUNNEL RELEASE LEFT;  Surgeon: Cindee Salt, MD;  Location: [facility];  Service: Orthopedics;  Laterality: Left;  45 MIN   CERVICAL FUSION  2008   hemorrhoids removed     TRIGGER FINGER RELEASE  2008   Patient Active Problem List   Diagnosis Date Noted   Pre-operative clearance 05/26/2023   Primary osteoarthritis of left knee 02/11/2020   Cervical fusion  syndrome 05/24/2019   Right arm pain 03/07/2019   Diverticulosis 03/06/2019   CKD (chronic kidney disease), stage III (HCC) 09/06/2018   Allergic rhinitis 08/13/2018   Hypothyroid 02/05/2018   Osteopenia with high risk of fracture 04/26/2017   Bilateral tinnitus 04/25/2017   Restrictive lung disease 04/07/2017   Centrilobular emphysema (HCC) 01/19/2017   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   Trigger ring finger of right hand 08/12/2016   Genital herpes 02/16/2016   Primary osteoarthritis of right hip 07/27/2015   ASCVD (arteriosclerotic cardiovascular disease) 02/05/2015   Rectal bleeding 02/05/2015   Pulmonary nodules 02/05/2015   Glaucoma 01/06/2015   Fibromyalgia 01/06/2015   Elevated cholesterol with elevated triglycerides 01/06/2015   Obstructive sleep apnea on CPAP 01/06/2015   Osteoporosis 01/06/2015    PCP: Agapito Games, MD   REFERRING PROVIDER: Monica Becton,*   REFERRING DIAG: Y78.29 (ICD-10-CM) - Primary osteoarthritis of right hip   THERAPY DIAG:  Pain in right hip  Stiffness of right hip, not elsewhere classified  Cramp and spasm  Muscle weakness (generalized)  Rationale for Evaluation and Treatment: Rehabilitation  ONSET DATE: One year  SUBJECTIVE:   SUBJECTIVE STATEMENT: Patient reports she feels a little SOB today. I was rushing to get here  and I feel a little flushed.   EVAL: patient will be having THA in a few weeks. Can't sleep on side. Hurts to walk, sit to stand. Have been using a cane for two weeks.  PERTINENT HISTORY: OP, neck surgery, L knee meniscus, fibromyalgia PAIN:  Are you having pain? Yes: NPRS scale: 8/10 Pain location: R hip down to ankle med and lateral hip Pain description: ache Aggravating factors: sit to stand Relieving factors: lying on back with legs straight  PRECAUTIONS: Fall  RED FLAGS: None   WEIGHT BEARING RESTRICTIONS: No  FALLS:  Has patient fallen in last 6 months? Yes.  Number of falls 3 fell on the plane with sit to stand, grabbed on to downspout outside and it was loose and fell outside, in the house and the cat cut me off.   LIVING ENVIRONMENT: Lives with: lives alone Lives in: House/apartment Stairs: No Has following equipment at home: Single point cane and Environmental consultant - 2 wheeled  OCCUPATION: retired  PLOF: Independent  PATIENT GOALS: get around again and clean my own house  NEXT MD VISIT: 06/20/23  OBJECTIVE:  Note: Objective measures were completed at Evaluation unless otherwise noted.  DIAGNOSTIC FINDINGS:  03/02/23 XR IMPRESSION: 1. Severe osteoarthritis of the right hip, substantially progressive from 07/22/2015. 2. Moderate osteoarthritis of the left hip. 3. L4-5 spondylosis.  PATIENT SURVEYS:  FOTO 33 (goal 48)  COGNITION: Overall cognitive status: Within functional limits for tasks assessed     SENSATION: WFL  MUSCLE LENGTH:   POSTURE: rounded shoulders and forward head  PALPATION: Lateral and medial hip/thigh  LOWER EXTREMITY ROM:  Active ROM Right eval Left eval Right  1/28  Hip flexion 90* 108 101  Hip extension     Hip abduction     Hip adduction     Hip internal rotation 24* 45 22*  Hip external rotation 26* 34 24*  Knee flexion     Knee extension     Ankle dorsiflexion     Ankle plantarflexion     Ankle inversion     Ankle eversion      (Blank rows = not tested)  LOWER EXTREMITY ZOX:WRUEAVW 5/5 except where indicated  MMT Right eval Left eval Right 1/30 Left  1/30  Hip flexion 4* 4 4 4+  Hip extension      Hip abduction 4-** 5 5   Hip adduction 4+ 5 5   Hip internal rotation NT NT    Hip external rotation NT NT    Knee flexion      Knee extension   4*   Ankle dorsiflexion      Ankle plantarflexion      Ankle inversion      Ankle eversion       (Blank rows = not tested)   FUNCTIONAL TESTS:  5 times sit to stand: 17.7 sec with B UE assist Timed up and go (TUG): 15.1 sec  no AD 06/06/23  5XSTS  with B UE assist 16 seconds  GAIT: Distance walked: 20 Assistive device utilized: Single point cane Level of assistance: Modified independence Comments: antalgic, short step/stride length  [facility] Adult PT Treatment:                                                DATE: 06/08/2023  Vitals: O2 sats 94 % and HR 99 -  did some pursed lip breathing and O2 quickly rose to 98%  Therapeutic Exercise: Bridges 2x15 Hooklying hip ER isometric + black TB (active L clam x 20 Supine  hip add isometric with ball squeeze b/w knees 2x10x5"  Seated marching x 10 B, then with 2# AW Attempted LAQ but painful in lateral R hip STS on airex, no HHA (chair in front if needed) 1x10 (02 sats 93 after this) did deep breaths and returned to 95% Standing: HS curls 2 x 10 (B) Toe raises 2 x 10, unilateral toe raises 2 x 10 Hip circles CW/CCW 2 x 10 ea B Mini squat 2 x 10 Calf stretch at chair - 10x dynamically then hold for 20 sec   [facility] Adult PT Treatment:                                                DATE: 06/03/2023 Therapeutic Exercise: Henreitta Leber 2x15 Hooklying hip ER isometric + black TB Seated hip add isometric with ball squeeze b/w knees 2x10x5"  STS 2x5 with B UE support for goal assessment STS on airex, no HHA (chair in front if needed) 2x5  Standing: HS curls 2 x 10 (B) Toe raises x 20, unilateral toe raises x 20 Hip circles CW/CCW x 10 ea B Mini squat x15 Calf stretch at chair - 10x dynamically then hold for 30 sec   [facility] Adult PT Treatment:                                                DATE: 06/01/2023 Therapeutic Exercise: Henreitta Leber 2x15 Hooklying hip ER isometric + black TB Seated hip add isometric with ball squeeze b/w knees 2x10x5"  STS on airex, no HHA (chair in front if needed) 2x10 Standing: HS curls x15 (B) Toe raises x15 Hip circles CW/CCW Mini squat x15  TREATMENT DATE:   05/30/23  Manual: long leg traction for pain control; pt wanted to try distraction again, but still  caused pain Therex: Attempted sidelying hip ABD but pain refers down leg Bridge x 10 Hooklying clam with black band high on thighs 5 sec hold x 20 Bridge with black band x 5 Heel raises at counter x 10 Toe raises x 10 HS curls x 10 B Mini squat at counter x10 Standing on L - hip circles R CW/CCW HEP Update   [facility] Adult PT Treatment:                                                DATE: 05/25/2023 Manual Therapy: Hooklying: Alternating long axis traction through R femur with long axis AP glide through R femur L sidelying: Gentle soft tissue mobilization to R posterolateral hip musculature Gentle inferior glides to R greater trochanter Modalities: Hooklying: Hot pack x 10 min R hip   PATIENT EDUCATION:  Education details: PT eval findings, anticipated POC, initial HEP, and log roll  Person educated: Patient Education method: Explanation, Demonstration, and Handouts Education comprehension: verbalized understanding and returned demonstration  HOME EXERCISE PROGRAM: Access Code: [email or web address] URL: [email or web address] Date: 06/06/2023 Prepared by: Raynelle Fanning  Exercises - Supine  Bridge  - 2 x daily - 7 x weekly - 1-3 sets - 10 reps - Supine Heel Slide with Strap (Mirrored)  - 2 x daily - 7 x weekly - 2 sets - 10 reps - Sitting to Supine Roll  - 1 x daily - 7 x weekly - 1 sets - 10 reps - Hooklying Isometric Hip External Rotation  - 1 x daily - 7 x weekly - 1-2 sets - 10 reps - 10 sec hold - Seated Hip Adduction Isometrics with Ball  - 1 x daily - 7 x weekly - 1-2 sets - 10 reps - 5 hold - Sit to Stand with Armchair  - 2 x daily - 7 x weekly - 1 sets - 10 reps - Heel Raises with Counter Support  - 1 x daily - 3 x weekly - 2 sets - 10 reps - Toe Raises with Counter Support  - 1 x daily - 3 x weekly - 2 sets - 10 reps - Standing Hamstring Curl with Chair Support  - 1 x daily - 3 x weekly - 2 sets - 10 reps - Mini Squat with Counter Support  - 1 x daily - 3 x weekly - 2  sets - 10 reps - Standing Hip Circles  - 1 x daily - 3 x weekly - 2 sets - 10 reps - Standing Gastroc Stretch  - 2 x daily - 7 x weekly - 3 reps - 30s hold  ASSESSMENT:  CLINICAL IMPRESSION: Pt has made significant gains in strength and pain with MMT. She was somewhat winded today so O2 sats were monitored. She has COPD. She was able to normalize sats with pursed lip breathing. She had some pain with LAQ in the L lateral hip so we did not continue with this exercise. She would like to decrease to 1x/wk for 3 more visits before her surgery, due to her busy schedule. She continues to progress with strength and ROM, but pain has not changed.   Eval: Patient is a 83 y.o. female who was seen today for physical therapy evaluation and treatment for chronic right hip pain due to OA. She is scheduled to have a THA in the coming weeks. She presently has constant pain that is worse with walking, bed mobility and sit to stand transfers and she has had three falls in the past six months. She uses a cane now for stability. She will benefit from skilled PT to address these deficits.    OBJECTIVE IMPAIRMENTS: decreased activity tolerance, decreased balance, difficulty walking, decreased ROM, decreased strength, increased muscle spasms, impaired flexibility, and pain.   ACTIVITY LIMITATIONS: lifting, sitting, standing, sleeping, stairs, transfers, bed mobility, dressing, and locomotion level  PARTICIPATION LIMITATIONS: cleaning, shopping, and community activity  PERSONAL FACTORS: Age, Time since onset of injury/illness/exacerbation, and 1 comorbidity: severe OA R hip  are also affecting patient's functional outcome.   REHAB POTENTIAL: Fair she is being scheduled for hip replacement; goal is to strengthen patient to improve outcomes from surgery  CLINICAL DECISION MAKING: Stable/uncomplicated  EVALUATION COMPLEXITY: Low   GOALS: Goals reviewed with patient? Yes  SHORT TERM GOALS: Target date: 05/30/2023    Patient will be independent with initial HEP. Baseline:  Goal status: MET   LONG TERM GOALS: Target date: 06/27/2023   Patient will be independent with advanced/ongoing HEP to improve outcomes and carryover.  Baseline:  Goal status: INITIAL  2.  Patient will report 25% improvement in R hip pain to improve QOL  prior to surgery. Baseline:  Goal status: INITIAL  3.  Patient will demonstrate improved functional LE strength as demonstrated by improved 5xSTS by 2-3 seconds. Baseline: 17.7 sec, 1/28 16 sec  Goal status: IN PROGRESS  4.  Improved LE strength to 4+/5 Baseline:  Goal status: INITIAL  5.  Patient will report improved FOTO to 40 prior to surgery to demonstrate improved functional ability. Baseline: 33 Goal status: INITIAL    PLAN:  PT FREQUENCY: 2x/week  PT DURATION: 6 weeks  PLANNED INTERVENTIONS: 97164- PT Re-evaluation, 97110-Therapeutic exercises, 97530- Therapeutic activity, 97112- Neuromuscular re-education, 97535- Self Care, 64403- Manual therapy, [phone]- Gait training, [phone]- Aquatic Therapy, 97014- Electrical stimulation (unattended), Patient/Family education, Balance training, Stair training, Taping, Dry Needling, Joint mobilization, Cryotherapy, and Moist heat  PLAN FOR NEXT SESSION: continue with LE strengthening, (THA scheduled 07/04/23)   Solon Palm, PT  06/08/2023, 4:34 PM

## 2023-06-08 ENCOUNTER — Encounter: Payer: Self-pay | Admitting: Physical Therapy

## 2023-06-08 ENCOUNTER — Ambulatory Visit: Payer: Medicare PPO | Admitting: Physical Therapy

## 2023-06-08 DIAGNOSIS — M6281 Muscle weakness (generalized): Secondary | ICD-10-CM | POA: Diagnosis not present

## 2023-06-08 DIAGNOSIS — M1611 Unilateral primary osteoarthritis, right hip: Secondary | ICD-10-CM | POA: Diagnosis not present

## 2023-06-08 DIAGNOSIS — M25651 Stiffness of right hip, not elsewhere classified: Secondary | ICD-10-CM | POA: Diagnosis not present

## 2023-06-08 DIAGNOSIS — M25551 Pain in right hip: Secondary | ICD-10-CM | POA: Diagnosis not present

## 2023-06-08 DIAGNOSIS — R252 Cramp and spasm: Secondary | ICD-10-CM

## 2023-06-16 ENCOUNTER — Encounter: Payer: Self-pay | Admitting: Physical Therapy

## 2023-06-16 ENCOUNTER — Ambulatory Visit: Payer: Medicare PPO | Attending: Sports Medicine | Admitting: Physical Therapy

## 2023-06-16 DIAGNOSIS — M25551 Pain in right hip: Secondary | ICD-10-CM | POA: Diagnosis not present

## 2023-06-16 DIAGNOSIS — M25651 Stiffness of right hip, not elsewhere classified: Secondary | ICD-10-CM | POA: Diagnosis not present

## 2023-06-16 DIAGNOSIS — M6281 Muscle weakness (generalized): Secondary | ICD-10-CM | POA: Insufficient documentation

## 2023-06-16 NOTE — Therapy (Signed)
 OUTPATIENT PHYSICAL THERAPY LOWER EXTREMITY TREATMENT   Patient Name: BILLEE BALCERZAK MRN: [id number] DOB:1940-07-28, 83 y.o., female Today's Date: 06/16/2023  END OF SESSION:  PT End of Session - 06/16/23 1056     Visit Number 8    Number of Visits 12    Date for PT Re-Evaluation 06/27/23    Authorization Type HU[employer] MCR    Authorization Time Period 12 VISITS APPROVED FOR PT 05/16/2023-06/27/2023    Authorization - Visit Number 8    Authorization - Number of Visits 12    Progress Note Due on Visit 10    PT Start Time 1022   pt arrived late   PT Stop Time 1100    PT Time Calculation (min) 38 min    Activity Tolerance Patient tolerated treatment well    Behavior During Therapy WFL for tasks assessed/performed               Past Medical History:  Diagnosis Date   Anxiety    intermittent   blood in stool    CKD (chronic kidney disease), stage III (HCC)    COPD (chronic obstructive pulmonary disease) (HCC)    Diverticulosis    Emphysema lung (HCC)    Fibromyalgia    GERD (gastroesophageal reflux disease)    Glaucoma    Hemorrhoids    HSV-2 (herpes simplex virus 2) infection 2009   Hypothyroidism    OSA on CPAP 07/27/2006   Osteoporosis    Rectal bleeding    with hemorrhoids   Past Surgical History:  Procedure Laterality Date   BUNIONECTOMY  [id number]   CARPAL TUNNEL RELEASE Right 12/08/2016   Procedure: CARPAL TUNNEL RELEASE;  Surgeon: Murrell Kuba, MD;  Location: [facility];  Service: Orthopedics;  Laterality: Right;  REG/FAB   CARPAL TUNNEL RELEASE Left 07/02/2021   Procedure: CARPAL TUNNEL RELEASE LEFT;  Surgeon: Murrell Kuba, MD;  Location: [facility];  Service: Orthopedics;  Laterality: Left;  45 MIN   CERVICAL FUSION  2008   hemorrhoids removed     TRIGGER FINGER RELEASE  2008   Patient Active Problem List   Diagnosis Date Noted   Pre-operative clearance 05/26/2023   Primary osteoarthritis of left knee 02/11/2020    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   Right arm pain 03/07/2019   Diverticulosis 03/06/2019   CKD (chronic kidney disease), stage III (HCC) 09/06/2018   Allergic rhinitis 08/13/2018   Hypothyroid 02/05/2018   Osteopenia with high risk of fracture 04/26/2017   Bilateral tinnitus 04/25/2017   Restrictive lung disease 04/07/2017   Centrilobular emphysema (HCC) 01/19/2017   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   Trigger ring finger of right hand 08/12/2016   Genital herpes 02/16/2016   Primary osteoarthritis of right hip 07/27/2015   ASCVD (arteriosclerotic cardiovascular disease) 02/05/2015   Rectal bleeding 02/05/2015   Pulmonary nodules 02/05/2015   Glaucoma 01/06/2015   Fibromyalgia 01/06/2015   Elevated cholesterol with elevated triglycerides 01/06/2015   Obstructive sleep apnea on CPAP 01/06/2015   Osteoporosis 01/06/2015    PCP: Alvan Dorothyann BIRCH, MD   REFERRING PROVIDER: Curtis Debby PARAS,*   REFERRING DIAG: F83.88 (ICD-10-CM) - Primary osteoarthritis of right hip   THERAPY DIAG:  Pain in right hip  Stiffness of right hip, not elsewhere classified  Muscle weakness (generalized)  Rationale for Evaluation and Treatment: Rehabilitation  ONSET DATE: One year  SUBJECTIVE:   SUBJECTIVE STATEMENT: Patient reports it has been a tough week. She states she doesn't feel  great and she has been really busy   EVAL: patient will be having THA in a few weeks. Can't sleep on side. Hurts to walk, sit to stand. Have been using a cane for two weeks.  PERTINENT HISTORY: OP, neck surgery, L knee meniscus, fibromyalgia PAIN:  Are you having pain? Yes: NPRS scale: 8/10 Pain location: R hip down to ankle med and lateral hip Pain description: ache Aggravating factors: sit to stand Relieving factors: lying on back with legs straight  PRECAUTIONS: Fall  RED FLAGS: None   WEIGHT BEARING RESTRICTIONS: No  FALLS:  Has patient fallen in last 6 months?  Yes. Number of falls 3 fell on the plane with sit to stand, grabbed on to downspout outside and it was loose and fell outside, in the house and the cat cut me off.   LIVING ENVIRONMENT: Lives with: lives alone Lives in: House/apartment Stairs: No Has following equipment at home: Single point cane and Environmental Consultant - 2 wheeled  OCCUPATION: retired  PLOF: Independent  PATIENT GOALS: get around again and clean my own house  NEXT MD VISIT: 06/20/23  OBJECTIVE:  Note: Objective measures were completed at Evaluation unless otherwise noted.  DIAGNOSTIC FINDINGS:  03/02/23 XR IMPRESSION: 1. Severe osteoarthritis of the right hip, substantially progressive from 07/22/2015. 2. Moderate osteoarthritis of the left hip. 3. L4-5 spondylosis.  PATIENT SURVEYS:  FOTO 33 (goal 48)   POSTURE: rounded shoulders and forward head  PALPATION: Lateral and medial hip/thigh  LOWER EXTREMITY ROM:  Active ROM Right eval Left eval Right  1/28  Hip flexion 90* 108 101  Hip extension     Hip abduction     Hip adduction     Hip internal rotation 24* 45 22*  Hip external rotation 26* 34 24*  Knee flexion     Knee extension     Ankle dorsiflexion     Ankle plantarflexion     Ankle inversion     Ankle eversion      (Blank rows = not tested)  LOWER EXTREMITY FFU:Hmnddob 5/5 except where indicated  MMT Right eval Left eval Right 1/30 Left  1/30  Hip flexion 4* 4 4 4+  Hip extension      Hip abduction 4-** 5 5   Hip adduction 4+ 5 5   Hip internal rotation NT NT    Hip external rotation NT NT    Knee flexion      Knee extension   4*   Ankle dorsiflexion      Ankle plantarflexion      Ankle inversion      Ankle eversion       (Blank rows = not tested)   FUNCTIONAL TESTS:  5 times sit to stand: 17.7 sec with B UE assist Timed up and go (TUG): 15.1 sec  no AD 06/06/23 5XSTS  with B UE assist 16 seconds  GAIT: Distance walked: 20 Assistive device utilized: Single point cane Level  of assistance: Modified independence Comments: antalgic, short step/stride length  [facility] Adult PT Treatment:                                                DATE: 06/16/23 Therapeutic Exercise/Activity: Bridge 2 x 15 Hooklying hip add with ball squeeze 10 x 10 sec Hooklying hip ER isometric + black TB Seated march x 12 Standing hip  circles 2 x 10 bilat Heel/toe raises x 10 bilat Mini squat 2 x 10 Standing HS curl 2 x 10 bilat STS x 5 - requires UE support this visit   [facility] Adult PT Treatment:                                                DATE: 06/08/2023  Vitals: O2 sats 94 % and HR 99 - did some pursed lip breathing and O2 quickly rose to 98%  Therapeutic Exercise: Bridges 2x15 Hooklying hip ER isometric + black TB (active L clam x 20 Supine  hip add isometric with ball squeeze b/w knees 2x10x5  Seated marching x 10 B, then with 2# AW Attempted LAQ but painful in lateral R hip STS on airex, no HHA (chair in front if needed) 1x10 (02 sats 93 after this) did deep breaths and returned to 95% Standing: HS curls 2 x 10 (B) Toe raises 2 x 10, unilateral toe raises 2 x 10 Hip circles CW/CCW 2 x 10 ea B Mini squat 2 x 10 Calf stretch at chair - 10x dynamically then hold for 20 sec   [facility] Adult PT Treatment:                                                DATE: 06/03/2023 Therapeutic Exercise: Kallie 2x15 Hooklying hip ER isometric + black TB Seated hip add isometric with ball squeeze b/w knees 2x10x5  STS 2x5 with B UE support for goal assessment STS on airex, no HHA (chair in front if needed) 2x5  Standing: HS curls 2 x 10 (B) Toe raises x 20, unilateral toe raises x 20 Hip circles CW/CCW x 10 ea B Mini squat x15 Calf stretch at chair - 10x dynamically then hold for 30 sec   [facility] Adult PT Treatment:                                                DATE: 06/01/2023 Therapeutic Exercise: Kallie 2x15 Hooklying hip ER isometric + black TB Seated hip add isometric with ball  squeeze b/w knees 2x10x5  STS on airex, no HHA (chair in front if needed) 2x10 Standing: HS curls x15 (B) Toe raises x15 Hip circles CW/CCW Mini squat x15   PATIENT EDUCATION:  Education details: PT eval findings, anticipated POC, initial HEP, and log roll  Person educated: Patient Education method: Explanation, Demonstration, and Handouts Education comprehension: verbalized understanding and returned demonstration  HOME EXERCISE PROGRAM: Access Code: [email or web address] URL: [email or web address] Date: 06/06/2023 Prepared by: Mliss  Exercises - Supine Bridge  - 2 x daily - 7 x weekly - 1-3 sets - 10 reps - Supine Heel Slide with Strap (Mirrored)  - 2 x daily - 7 x weekly - 2 sets - 10 reps - Sitting to Supine Roll  - 1 x daily - 7 x weekly - 1 sets - 10 reps - Hooklying Isometric Hip External Rotation  - 1 x daily - 7 x weekly - 1-2 sets - 10 reps - 10 sec hold - Seated Hip Adduction Isometrics with Mercer  -  1 x daily - 7 x weekly - 1-2 sets - 10 reps - 5 hold - Sit to Stand with Armchair  - 2 x daily - 7 x weekly - 1 sets - 10 reps - Heel Raises with Counter Support  - 1 x daily - 3 x weekly - 2 sets - 10 reps - Toe Raises with Counter Support  - 1 x daily - 3 x weekly - 2 sets - 10 reps - Standing Hamstring Curl with Chair Support  - 1 x daily - 3 x weekly - 2 sets - 10 reps - Mini Squat with Counter Support  - 1 x daily - 3 x weekly - 2 sets - 10 reps - Standing Hip Circles  - 1 x daily - 3 x weekly - 2 sets - 10 reps - Standing Gastroc Stretch  - 2 x daily - 7 x weekly - 3 reps - 30s hold  ASSESSMENT:  CLINICAL IMPRESSION: More rest breaks today due to pt not feeling 100%. Treatment modified and pt able to perform without increase in symptoms  Eval: Patient is a 82 y.o. female who was seen today for physical therapy evaluation and treatment for chronic right hip pain due to OA. She is scheduled to have a THA in the coming weeks. She presently has constant pain that is  worse with walking, bed mobility and sit to stand transfers and she has had three falls in the past six months. She uses a cane now for stability. She will benefit from skilled PT to address these deficits.    OBJECTIVE IMPAIRMENTS: decreased activity tolerance, decreased balance, difficulty walking, decreased ROM, decreased strength, increased muscle spasms, impaired flexibility, and pain.     GOALS: Goals reviewed with patient? Yes  SHORT TERM GOALS: Target date: 05/30/2023   Patient will be independent with initial HEP. Baseline:  Goal status: MET   LONG TERM GOALS: Target date: 06/27/2023   Patient will be independent with advanced/ongoing HEP to improve outcomes and carryover.  Baseline:  Goal status: INITIAL  2.  Patient will report 25% improvement in R hip pain to improve QOL prior to surgery. Baseline:  Goal status: INITIAL  3.  Patient will demonstrate improved functional LE strength as demonstrated by improved 5xSTS by 2-3 seconds. Baseline: 17.7 sec, 1/28 16 sec  Goal status: IN PROGRESS  4.  Improved LE strength to 4+/5 Baseline:  Goal status: INITIAL  5.  Patient will report improved FOTO to 40 prior to surgery to demonstrate improved functional ability. Baseline: 33 Goal status: INITIAL    PLAN:  PT FREQUENCY: 2x/week  PT DURATION: 6 weeks  PLANNED INTERVENTIONS: 97164- PT Re-evaluation, 97110-Therapeutic exercises, 97530- Therapeutic activity, 97112- Neuromuscular re-education, 97535- Self Care, 02859- Manual therapy, [phone]- Gait training, [phone]- Aquatic Therapy, 97014- Electrical stimulation (unattended), Patient/Family education, Balance training, Stair training, Taping, Dry Needling, Joint mobilization, Cryotherapy, and Moist heat  PLAN FOR NEXT SESSION: continue with LE strengthening, (THA scheduled 07/04/23)   Darice Conine, PT,DPT02/11/2508:57 AM

## 2023-06-20 ENCOUNTER — Ambulatory Visit: Payer: Medicare PPO | Admitting: Physical Therapy

## 2023-06-20 ENCOUNTER — Encounter: Payer: Self-pay | Admitting: Physical Therapy

## 2023-06-20 DIAGNOSIS — M25651 Stiffness of right hip, not elsewhere classified: Secondary | ICD-10-CM

## 2023-06-20 DIAGNOSIS — M6281 Muscle weakness (generalized): Secondary | ICD-10-CM

## 2023-06-20 DIAGNOSIS — M25551 Pain in right hip: Secondary | ICD-10-CM | POA: Diagnosis not present

## 2023-06-20 DIAGNOSIS — M1611 Unilateral primary osteoarthritis, right hip: Secondary | ICD-10-CM | POA: Diagnosis not present

## 2023-06-20 NOTE — Therapy (Signed)
OUTPATIENT PHYSICAL THERAPY LOWER EXTREMITY TREATMENT   Patient Name: [name] [name] MRN: [id number] DOB:02-Mar-1941, 83 y.o., female Today's Date: 06/20/2023  END OF SESSION:  PT End of Session - 06/20/23 1434     Visit Number 9    Number of Visits 12    Date for PT Re-Evaluation 06/27/23    Authorization Type [employer] MCR    Authorization Time Period 12 VISITS APPROVED FOR PT 05/16/2023-06/27/2023    Authorization - Visit Number 9    Progress Note Due on Visit 10    PT Start Time 1357    PT Stop Time 1435    PT Time Calculation (min) 38 min    Activity Tolerance Patient tolerated treatment well;Patient limited by pain    Behavior During Therapy WFL for tasks assessed/performed                Past Medical History:  Diagnosis Date   Anxiety    intermittent   blood in stool    CKD (chronic kidney disease), stage III (HCC)    COPD (chronic obstructive pulmonary disease) (HCC)    Diverticulosis    Emphysema lung (HCC)    Fibromyalgia    GERD (gastroesophageal reflux disease)    Glaucoma    Hemorrhoids    HSV-2 (herpes simplex virus 2) infection 2009   Hypothyroidism    OSA on CPAP 07/27/2006   Osteoporosis    Rectal bleeding    with hemorrhoids   Past Surgical History:  Procedure Laterality Date   BUNIONECTOMY  [id number]   CARPAL TUNNEL RELEASE Right 12/08/2016   Procedure: CARPAL TUNNEL RELEASE;  Surgeon: Cindee Salt, MD;  Location: [facility];  Service: Orthopedics;  Laterality: Right;  REG/FAB   CARPAL TUNNEL RELEASE Left 07/02/2021   Procedure: CARPAL TUNNEL RELEASE LEFT;  Surgeon: Cindee Salt, MD;  Location: [facility];  Service: Orthopedics;  Laterality: Left;  45 MIN   CERVICAL FUSION  2008   hemorrhoids removed     TRIGGER FINGER RELEASE  2008   Patient Active Problem List   Diagnosis Date Noted   Pre-operative clearance 05/26/2023   Primary osteoarthritis of left knee 02/11/2020   Cervical fusion syndrome  05/24/2019   Right arm pain 03/07/2019   Diverticulosis 03/06/2019   CKD (chronic kidney disease), stage III (HCC) 09/06/2018   Allergic rhinitis 08/13/2018   Hypothyroid 02/05/2018   Osteopenia with high risk of fracture 04/26/2017   Bilateral tinnitus 04/25/2017   Restrictive lung disease 04/07/2017   Centrilobular emphysema (HCC) 01/19/2017   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   Trigger ring finger of right hand 08/12/2016   Genital herpes 02/16/2016   Primary osteoarthritis of right hip 07/27/2015   ASCVD (arteriosclerotic cardiovascular disease) 02/05/2015   Rectal bleeding 02/05/2015   Pulmonary nodules 02/05/2015   Glaucoma 01/06/2015   Fibromyalgia 01/06/2015   Elevated cholesterol with elevated triglycerides 01/06/2015   Obstructive sleep apnea on CPAP 01/06/2015   Osteoporosis 01/06/2015    PCP: Agapito Games, MD   REFERRING PROVIDER: Monica Becton,*   REFERRING DIAG: Y78.29 (ICD-10-CM) - Primary osteoarthritis of right hip   THERAPY DIAG:  Pain in right hip  Stiffness of right hip, not elsewhere classified  Muscle weakness (generalized)  Rationale for Evaluation and Treatment: Rehabilitation  ONSET DATE: One year  SUBJECTIVE:   SUBJECTIVE STATEMENT: "I'm doing well considering the weather"   EVAL: patient will be having THA in a few weeks. Can't sleep on side.  Hurts to walk, sit to stand. Have been using a cane for two weeks.  PERTINENT HISTORY: OP, neck surgery, L knee meniscus, fibromyalgia PAIN:  Are you having pain? Yes: NPRS scale: 7/10 Pain location: R hip down to ankle med and lateral hip Pain description: ache Aggravating factors: sit to stand Relieving factors: lying on back with legs straight  PRECAUTIONS: Fall  RED FLAGS: None   WEIGHT BEARING RESTRICTIONS: No  FALLS:  Has patient fallen in last 6 months? Yes. Number of falls 3 fell on the plane with sit to stand, grabbed on to downspout outside  and it was loose and fell outside, in the house and the cat cut me off.   LIVING ENVIRONMENT: Lives with: lives alone Lives in: House/apartment Stairs: No Has following equipment at home: Single point cane and Environmental consultant - 2 wheeled  OCCUPATION: retired  PLOF: Independent  PATIENT GOALS: get around again and clean my own house  NEXT MD VISIT: 06/20/23  OBJECTIVE:  Note: Objective measures were completed at Evaluation unless otherwise noted.  DIAGNOSTIC FINDINGS:  03/02/23 XR IMPRESSION: 1. Severe osteoarthritis of the right hip, substantially progressive from 07/22/2015. 2. Moderate osteoarthritis of the left hip. 3. L4-5 spondylosis.  PATIENT SURVEYS:  FOTO 33 (goal 48)   POSTURE: rounded shoulders and forward head  PALPATION: Lateral and medial hip/thigh  LOWER EXTREMITY ROM:  Active ROM Right eval Left eval Right  1/28  Hip flexion 90* 108 101  Hip extension     Hip abduction     Hip adduction     Hip internal rotation 24* 45 22*  Hip external rotation 26* 34 24*  Knee flexion     Knee extension     Ankle dorsiflexion     Ankle plantarflexion     Ankle inversion     Ankle eversion      (Blank rows = not tested)  LOWER EXTREMITY WUJ:[id number] 5/5 except where indicated  MMT Right eval Left eval Right 1/30 Left  1/30  Hip flexion 4* 4 4 4+  Hip extension      Hip abduction 4-** 5 5   Hip adduction 4+ 5 5   Hip internal rotation NT NT    Hip external rotation NT NT    Knee flexion      Knee extension   4*   Ankle dorsiflexion      Ankle plantarflexion      Ankle inversion      Ankle eversion       (Blank rows = not tested)   FUNCTIONAL TESTS:  5 times sit to stand: 17.7 sec with B UE assist Timed up and go (TUG): 15.1 sec  no AD 06/06/23 5XSTS  with B UE assist 16 seconds  GAIT: Distance walked: 20 Assistive device utilized: Single point cane Level of assistance: Modified independence Comments: antalgic, short step/stride length  [facility]  Adult PT Treatment:                                                DATE: 06/20/23 Therapeutic Exercise/Activity: Bridge 2 x 15 Hooklying hip add with ball squeeze 10 x 10 sec Hooklying hip ER isometric + black TB x 15 Seated march 2# 2 x 10 Standing hip circles 2 x 10 Rt LE only due to pain with standing on Rt side Heel/toe raises x 15  bilat Standing HS curl Rt only x 15 Hip flex with abd/add over dumbell x 15 Attempted hip IR - painful Discussed home set up and energy conservation techniques for after surgery   [facility] Adult PT Treatment:                                                DATE: 06/16/23 Therapeutic Exercise/Activity: Bridge 2 x 15 Hooklying hip add with ball squeeze 10 x 10 sec Hooklying hip ER isometric + black TB Seated march x 12 LAQ 2 x 10 bilat Standing hip circles 2 x 10 bilat Heel/toe raises x 10 bilat Mini squat 2 x 10 Standing HS curl 2 x 10 bilat STS x 5 - requires UE support this visit   [facility] Adult PT Treatment:                                                DATE: 06/08/2023  Vitals: O2 sats 94 % and HR 99 - did some pursed lip breathing and O2 quickly rose to 98%  Therapeutic Exercise: Bridges 2x15 Hooklying hip ER isometric + black TB (active L clam x 20 Supine  hip add isometric with ball squeeze b/w knees 2x10x5"  Seated marching x 10 B, then with 2# AW Attempted LAQ but painful in lateral R hip STS on airex, no HHA (chair in front if needed) 1x10 (02 sats 93 after this) did deep breaths and returned to 95% Standing: HS curls 2 x 10 (B) Toe raises 2 x 10, unilateral toe raises 2 x 10 Hip circles CW/CCW 2 x 10 ea B Mini squat 2 x 10 Calf stretch at chair - 10x dynamically then hold for 20 sec     PATIENT EDUCATION:  Education details: PT eval findings, anticipated POC, initial HEP, and log roll  Person educated: Patient Education method: Explanation, Demonstration, and Handouts Education comprehension: verbalized understanding and returned  demonstration  HOME EXERCISE PROGRAM: Access Code: [email or web address] URL: [email or web address] Date: 06/06/2023 Prepared by: Raynelle Fanning  Exercises - Supine Bridge  - 2 x daily - 7 x weekly - 1-3 sets - 10 reps - Supine Heel Slide with Strap (Mirrored)  - 2 x daily - 7 x weekly - 2 sets - 10 reps - Sitting to Supine Roll  - 1 x daily - 7 x weekly - 1 sets - 10 reps - Hooklying Isometric Hip External Rotation  - 1 x daily - 7 x weekly - 1-2 sets - 10 reps - 10 sec hold - Seated Hip Adduction Isometrics with Ball  - 1 x daily - 7 x weekly - 1-2 sets - 10 reps - 5 hold - Sit to Stand with Armchair  - 2 x daily - 7 x weekly - 1 sets - 10 reps - Heel Raises with Counter Support  - 1 x daily - 3 x weekly - 2 sets - 10 reps - Toe Raises with Counter Support  - 1 x daily - 3 x weekly - 2 sets - 10 reps - Standing Hamstring Curl with Chair Support  - 1 x daily - 3 x weekly - 2 sets - 10 reps - Mini Squat with Counter Support  - 1 x daily -  3 x weekly - 2 sets - 10 reps - Standing Hip Circles  - 1 x daily - 3 x weekly - 2 sets - 10 reps - Standing Gastroc Stretch  - 2 x daily - 7 x weekly - 3 reps - 30s hold  ASSESSMENT:  CLINICAL IMPRESSION: Pt with increased pain in SLS today so standing exercise performed on Rt LE only. PT educated pt on energy conservation and home set up for post surgery. Pt plans to d/c after next visit to have surgery and then will be followed by HHPT  Eval: Patient is a 83 y.o. female who was seen today for physical therapy evaluation and treatment for chronic right hip pain due to OA. She is scheduled to have a THA in the coming weeks. She presently has constant pain that is worse with walking, bed mobility and sit to stand transfers and she has had three falls in the past six months. She uses a cane now for stability. She will benefit from skilled PT to address these deficits.    OBJECTIVE IMPAIRMENTS: decreased activity tolerance, decreased balance, difficulty walking,  decreased ROM, decreased strength, increased muscle spasms, impaired flexibility, and pain.     GOALS: Goals reviewed with patient? Yes  SHORT TERM GOALS: Target date: 05/30/2023   Patient will be independent with initial HEP. Baseline:  Goal status: MET   LONG TERM GOALS: Target date: 06/27/2023   Patient will be independent with advanced/ongoing HEP to improve outcomes and carryover.  Baseline:  Goal status: INITIAL  2.  Patient will report 25% improvement in R hip pain to improve QOL prior to surgery. Baseline:  Goal status: INITIAL  3.  Patient will demonstrate improved functional LE strength as demonstrated by improved 5xSTS by 2-3 seconds. Baseline: 17.7 sec, 1/28 16 sec  Goal status: IN PROGRESS  4.  Improved LE strength to 4+/5 Baseline:  Goal status: INITIAL  5.  Patient will report improved FOTO to 40 prior to surgery to demonstrate improved functional ability. Baseline: 33 Goal status: INITIAL    PLAN:  PT FREQUENCY: 2x/week  PT DURATION: 6 weeks  PLANNED INTERVENTIONS: 97164- PT Re-evaluation, 97110-Therapeutic exercises, 97530- Therapeutic activity, 97112- Neuromuscular re-education, 97535- Self Care, 52841- Manual therapy, [phone]- Gait training, [phone]- Aquatic Therapy, 97014- Electrical stimulation (unattended), Patient/Family education, Balance training, Stair training, Taping, Dry Needling, Joint mobilization, Cryotherapy, and Moist heat  PLAN FOR NEXT SESSION: update HEP, d/c to surgery    Reggy Eye, PT,DPT02/11/252:37 PM

## 2023-06-21 ENCOUNTER — Other Ambulatory Visit: Payer: Self-pay | Admitting: Orthopedic Surgery

## 2023-06-23 NOTE — Patient Instructions (Signed)
SURGICAL WAITING ROOM VISITATION  Patients having surgery or a procedure may have no more than 2 support people in the waiting area - these visitors may rotate.    Children under the age of 2 must have an adult with them who is not the patient.  Due to an increase in RSV and influenza rates and associated hospitalizations, children ages 61 and under may not visit patients in [facility] hospitals.  Visitors with respiratory illnesses are discouraged from visiting and should remain at home.  If the patient needs to stay at the hospital during part of their recovery, the visitor guidelines for inpatient rooms apply. Pre-op nurse will coordinate an appropriate time for 1 support person to accompany patient in pre-op.  This support person may not rotate.    Please refer to the [facility] website for the visitor guidelines for Inpatients (after your surgery is over and you are in a regular room).       Your procedure is scheduled on: 07-03-23    Report to [facility] Main Entrance    Report to admitting at 10:00  AM   Call this number if you have problems the morning of surgery [phone]   Do not eat food :After Midnight.   After Midnight you may have the following liquids until __0920____ AM/  DAY OF SURGERY  then nothing by mouht  Water Non-Citrus Juices (without pulp, NO RED-Apple, White grape, White cranberry) Black Coffee (NO MILK/CREAM OR CREAMERS, sugar ok)  Clear Tea (NO MILK/CREAM OR CREAMERS, sugar ok) regular and decaf                             Plain Jell-O (NO RED)                                           Fruit ices (not with fruit pulp, NO RED)                                     Popsicles (NO RED)                                                               Sports drinks like Gatorade (NO RED)                  The day of surgery:  Drink ONE (1) Pre-Surgery Clear Ensure BY 0920 AM the morning of surgery. Drink in one sitting. Do not sip.  This drink  was given to you during your hospital  pre-op appointment visit. Nothing else to drink after completing the  Pre-Surgery Clear Ensure .          If you have questions, please contact your surgeon's office.   FOLLOW BOWEL PREP AND ANY ADDITIONAL PRE OP INSTRUCTIONS YOU RECEIVED FROM YOUR SURGEON'S OFFICE!!!     Oral Hygiene is also important to reduce your risk of infection.  Remember - BRUSH YOUR TEETH THE MORNING OF SURGERY WITH YOUR REGULAR TOOTHPASTE  DENTURES WILL BE REMOVED PRIOR TO SURGERY PLEASE DO NOT APPLY "Poly grip" OR ADHESIVES!!!   Do NOT smoke after Midnight   Stop all vitamins and herbal supplements 7 days before surgery.   Take these medicines the morning of surgery with A SIP OF WATER: inhaler and bring rescue inhaler with you,eye drops as usual, loratadine, levothyroxine, Cymbalta, atorvastatin, tylenol if needed  DO NOT TAKE ANY ORAL DIABETIC MEDICATIONS DAY OF YOUR SURGERY Bring CPAP mask and tubing day of surgery.                              You may not have any metal on your body including hair pins, jewelry, and body piercing             Do not wear make-up, lotions, powders, perfumes/cologne, or deodorant  Do not wear nail polish including gel and S&S, artificial/acrylic nails, or any other type of covering on natural nails including finger and toenails. If you have artificial nails, gel coating, etc. that needs to be removed by a nail salon please have this removed prior to surgery or surgery may need to be canceled/ delayed if the surgeon/ anesthesia feels like they are unable to be safely monitored.   Do not shave 5 days  prior to surgery.                 Do not bring valuables to the hospital.  IS NOT             RESPONSIBLE   FOR VALUABLES.   Contacts, glasses, dentures or bridgework may not be worn into surgery.   Bring small overnight bag day of surgery.   DO NOT BRING YOUR HOME MEDICATIONS TO THE  HOSPITAL. PHARMACY WILL DISPENSE MEDICATIONS LISTED ON YOUR MEDICATION LIST TO YOU DURING YOUR ADMISSION IN THE HOSPITAL!    Patients discharged on the day of surgery will not be allowed to drive home.  Someone NEEDS to stay with you for the first 24 hours after anesthesia.   Special Instructions: Bring a copy of your healthcare power of attorney and living will documents the day of surgery if you haven't scanned them before.              Please read over the following fact sheets you were given: IF YOU HAVE QUESTIONS ABOUT YOUR PRE-OP INSTRUCTIONS PLEASE CALL [phone]   If you test positive for Covid or have been in contact with anyone that has tested positive in the last 10 days please notify you surgeon.      Pre-operative 5 CHG Bath Instructions   You can play a key role in reducing the risk of infection after surgery. Your skin needs to be as free of germs as possible. You can reduce the number of germs on your skin by washing with CHG (chlorhexidine gluconate) soap before surgery. CHG is an antiseptic soap that kills germs and continues to kill germs even after washing.   DO NOT use if you have an allergy to chlorhexidine/CHG or antibacterial soaps. If your skin becomes reddened or irritated, stop using the CHG and notify one of our RNs at [phone].   Please shower with the CHG soap starting 4 days before surgery using the following schedule:     Please keep in mind the following:  DO NOT shave, including legs  and underarms, starting the day of your first shower.   You may shave your face at any point before/day of surgery.  Place clean sheets on your bed the day you start using CHG soap. Use a clean washcloth (not used since being washed) for each shower. DO NOT sleep with pets once you start using the CHG.   CHG Shower Instructions:  If you choose to wash your hair and private area, wash first with your normal shampoo/soap.  After you use shampoo/soap, rinse your hair  and body thoroughly to remove shampoo/soap residue.  Turn the water OFF and apply about 3 tablespoons (45 ml) of CHG soap to a CLEAN washcloth.  Apply CHG soap ONLY FROM YOUR NECK DOWN TO YOUR TOES (washing for 3-5 minutes)  DO NOT use CHG soap on face, private areas, open wounds, or sores.  Pay special attention to the area where your surgery is being performed.  If you are having back surgery, having someone wash your back for you may be helpful. Wait 2 minutes after CHG soap is applied, then you may rinse off the CHG soap.  Pat dry with a clean towel  Put on clean clothes/pajamas   If you choose to wear lotion, please use ONLY the CHG-compatible lotions on the back of this paper.     Additional instructions for the day of surgery: DO NOT APPLY any lotions, deodorants, cologne, or perfumes.   Put on clean/comfortable clothes.  Brush your teeth.  Ask your nurse before applying any prescription medications to the skin.      CHG Compatible Lotions   Aveeno Moisturizing lotion  Cetaphil Moisturizing Cream  Cetaphil Moisturizing Lotion  Clairol Herbal Essence Moisturizing Lotion, Dry Skin  Clairol Herbal Essence Moisturizing Lotion, Extra Dry Skin  Clairol Herbal Essence Moisturizing Lotion, Normal Skin  Curel Age Defying Therapeutic Moisturizing Lotion with Alpha Hydroxy  Curel Extreme Care Body Lotion  Curel Soothing Hands Moisturizing Hand Lotion  Curel Therapeutic Moisturizing Cream, Fragrance-Free  Curel Therapeutic Moisturizing Lotion, Fragrance-Free  Curel Therapeutic Moisturizing Lotion, Original Formula  Eucerin Daily Replenishing Lotion  Eucerin Dry Skin Therapy Plus Alpha Hydroxy Crme  Eucerin Dry Skin Therapy Plus Alpha Hydroxy Lotion  Eucerin Original Crme  Eucerin Original Lotion  Eucerin Plus Crme Eucerin Plus Lotion  Eucerin TriLipid Replenishing Lotion  Keri Anti-Bacterial Hand Lotion  Keri Deep Conditioning Original Lotion Dry Skin Formula Softly Scented   Keri Deep Conditioning Original Lotion, Fragrance Free Sensitive Skin Formula  Keri Lotion Fast Absorbing Fragrance Free Sensitive Skin Formula  Keri Lotion Fast Absorbing Softly Scented Dry Skin Formula  Keri Original Lotion  Keri Skin Renewal Lotion Keri Silky Smooth Lotion  Keri Silky Smooth Sensitive Skin Lotion  Nivea Body Creamy Conditioning Oil  Nivea Body Extra Enriched Teacher, adult education Moisturizing Lotion Nivea Crme  Nivea Skin Firming Lotion  NutraDerm 30 Skin Lotion  NutraDerm Skin Lotion  NutraDerm Therapeutic Skin Cream  NutraDerm Therapeutic Skin Lotion  ProShield Protective Hand Cream  Provon moisturizing lotion   WHAT IS A BLOOD TRANSFUSION? Blood Transfusion Information  A transfusion is the replacement of blood or some of its parts. Blood is made up of multiple cells which provide different functions. Red blood cells carry oxygen and are used for blood loss replacement. White blood cells fight against infection. Platelets control bleeding. Plasma helps clot blood. Other blood products are available for specialized needs, such as hemophilia or other clotting disorders. BEFORE THE TRANSFUSION  Who gives blood for transfusions?  Healthy volunteers who are fully evaluated to make sure their blood is safe. This is blood bank blood. Transfusion therapy is the safest it has ever been in the practice of medicine. Before blood is taken from a donor, a complete history is taken to make sure that person has no history of diseases nor engages in risky social behavior (examples are intravenous drug use or sexual activity with multiple partners). The donor's travel history is screened to minimize risk of transmitting infections, such as malaria. The donated blood is tested for signs of infectious diseases, such as HIV and hepatitis. The blood is then tested to be sure it is compatible with you in order to minimize the chance of a transfusion  reaction. If you or a relative donates blood, this is often done in anticipation of surgery and is not appropriate for emergency situations. It takes many days to process the donated blood. RISKS AND COMPLICATIONS Although transfusion therapy is very safe and saves many lives, the main dangers of transfusion include:  Getting an infectious disease. Developing a transfusion reaction. This is an allergic reaction to something in the blood you were given. Every precaution is taken to prevent this. The decision to have a blood transfusion has been considered carefully by your caregiver before blood is given. Blood is not given unless the benefits outweigh the risks. AFTER THE TRANSFUSION Right after receiving a blood transfusion, you will usually feel much better and more energetic. This is especially true if your red blood cells have gotten low (anemic). The transfusion raises the level of the red blood cells which carry oxygen, and this usually causes an energy increase. The nurse administering the transfusion will monitor you carefully for complications. HOME CARE INSTRUCTIONS  No special instructions are needed after a transfusion. You may find your energy is better. Speak with your caregiver about any limitations on activity for underlying diseases you may have. SEEK MEDICAL CARE IF:  Your condition is not improving after your transfusion. You develop redness or irritation at the intravenous (IV) site. SEEK IMMEDIATE MEDICAL CARE IF:  Any of the following symptoms occur over the next 12 hours: Shaking chills. You have a temperature by mouth above 102 F (38.9 C), not controlled by medicine. Chest, back, or muscle pain. People around you feel you are not acting correctly or are confused. Shortness of breath or difficulty breathing. Dizziness and fainting. You get a rash or develop hives. You have a decrease in urine output. Your urine turns a dark color or changes to pink, red, or  brown. Any of the following symptoms occur over the next 10 days: You have a temperature by mouth above 102 F (38.9 C), not controlled by medicine. Shortness of breath. Weakness after normal activity. The white part of the eye turns yellow (jaundice). You have a decrease in the amount of urine or are urinating less often. Your urine turns a dark color or changes to pink, red, or brown. Document Released: 04/22/2000 Document Revised: 07/18/2011 Document Reviewed: 12/10/2007 [employer] Patient Information 2014 [place], [place].  _______________________________________________________________________  Incentive Spirometer  An incentive spirometer is a tool that can help keep your lungs clear and active. This tool measures how well you are filling your lungs with each breath. Taking long deep breaths may help reverse or decrease the chance of developing breathing (pulmonary) problems (especially infection) following: A long period of time when you are unable to move or be active. BEFORE THE PROCEDURE  If the spirometer includes an indicator to show your best effort, your nurse or respiratory therapist will set it to a desired goal. If possible, sit up straight or lean slightly forward. Try not to slouch. Hold the incentive spirometer in an upright position. INSTRUCTIONS FOR USE  Sit on the edge of your bed if possible, or sit up as far as you can in bed or on a chair. Hold the incentive spirometer in an upright position. Breathe out normally. Place the mouthpiece in your mouth and seal your lips tightly around it. Breathe in slowly and as deeply as possible, raising the piston or the ball toward the top of the column. Hold your breath for 3-5 seconds or for as long as possible. Allow the piston or ball to fall to the bottom of the column. Remove the mouthpiece from your mouth and breathe out normally. Rest for a few seconds and repeat Steps 1 through 7 at least 10 times every 1-2 hours when  you are awake. Take your time and take a few normal breaths between deep breaths. The spirometer may include an indicator to show your best effort. Use the indicator as a goal to work toward during each repetition. After each set of 10 deep breaths, practice coughing to be sure your lungs are clear. If you have an incision (the cut made at the time of surgery), support your incision when coughing by placing a pillow or rolled up towels firmly against it. Once you are able to get out of bed, walk around indoors and cough well. You may stop using the incentive spirometer when instructed by your caregiver.  RISKS AND COMPLICATIONS Take your time so you do not get dizzy or light-headed. If you are in pain, you may need to take or ask for pain medication before doing incentive spirometry. It is harder to take a deep breath if you are having pain. AFTER USE Rest and breathe slowly and easily. It can be helpful to keep track of a log of your progress. Your caregiver can provide you with a simple table to help with this. If you are using the spirometer at home, follow these instructions: SEEK MEDICAL CARE IF:  You are having difficultly using the spirometer. You have trouble using the spirometer as often as instructed. Your pain medication is not giving enough relief while using the spirometer. You develop fever of 100.5 F (38.1 C) or higher. SEEK IMMEDIATE MEDICAL CARE IF:  You cough up bloody sputum that had not been present before. You develop fever of 102 F (38.9 C) or greater. You develop worsening pain at or near the incision site. MAKE SURE YOU:  Understand these instructions. Will watch your condition. Will get help right away if you are not doing well or get worse. Document Released: 09/05/2006 Document Revised: 07/18/2011 Document Reviewed: 11/06/2006 [facility] Patient Information 2014 [place], [place].   ________________________________________________________________________

## 2023-06-23 NOTE — Progress Notes (Addendum)
PCP - Nani Gasser, MD LOV 05-26-23 epic  Clearance Cardiologist - Olga Millers, MD LOV 09-12-2019 no longer sees  PPM/ICD -  Device Orders -  Rep Notified -   Chest x-ray -  EKG - 05-26-23 epic Stress Test - 2021  epic ECHO -  Cardiac Cath -   Sleep Study -  CPAP - wears cpap  Fasting Blood Sugar -  Checks Blood Sugar _____ times a day  Blood Thinner Instructions: Aspirin Instructions:  ERAS Protcol - PRE-SURGERY Ensure   COVID vaccine -yes  Activity--Able to complete ADL's without CP some SOB not new for pt. Due to COPD Anesthesia review: COPD, CKD III, OSA with Cpap  Patient denies shortness of breath, fever, cough and chest pain at PAT appointment   All instructions explained to the patient, with a verbal understanding of the material. Patient agrees to go over the instructions while at home for a better understanding. Patient also instructed to self quarantine after being tested for COVID-19. The opportunity to ask questions was provided.

## 2023-06-27 ENCOUNTER — Ambulatory Visit: Payer: Medicare PPO | Admitting: Physical Therapy

## 2023-06-27 ENCOUNTER — Encounter: Payer: Self-pay | Admitting: Physical Therapy

## 2023-06-27 DIAGNOSIS — M25551 Pain in right hip: Secondary | ICD-10-CM

## 2023-06-27 DIAGNOSIS — M6281 Muscle weakness (generalized): Secondary | ICD-10-CM

## 2023-06-27 DIAGNOSIS — M25651 Stiffness of right hip, not elsewhere classified: Secondary | ICD-10-CM

## 2023-06-27 NOTE — Care Plan (Signed)
Ortho Bundle Case Management Note  Patient Details  Name: [name] [name] MRN: [id number] Date of Birth: [birth year]  met with patient in the office for H&P. will discharge to home with daughter to assist. rolling walker ordered. HHPT set up with [facility] and OPPT set up with [facility] [place]. discharge instructions discussed and questions answered. Patient and MD in agreement with plan. Choice offered.                     DME Arranged:  Dan Humphreys rolling DME Agency:  [employer]  HH Arranged:  PT HH Agency:  [facility] ([facility]  Additional Comments: Please contact me with any questions of if this plan should need to change.  Shauna Hugh,  RN,BSN,MHA,CCM  [facility]  [phone] 06/27/2023, 1:45 PM

## 2023-06-27 NOTE — Therapy (Signed)
OUTPATIENT PHYSICAL THERAPY LOWER EXTREMITY TREATMENT AND DISCHARGE   Patient Name: [name] [name] MRN: [id number] DOB:[birth year], 83 y.o., female Today's Date: 06/27/2023 Dates of service 05/16/23-06/27/23 END OF SESSION:  PT End of Session - 06/27/23 1436     Visit Number 10    Number of Visits 12    Date for PT Re-Evaluation 06/27/23    Authorization Type [employer] MCR    Authorization Time Period 12 VISITS APPROVED FOR PT 05/16/2023-06/27/2023    Authorization - Visit Number 10    Authorization - Number of Visits 12    PT Start Time 1400    PT Stop Time 1438    PT Time Calculation (min) 38 min    Activity Tolerance Patient tolerated treatment well;Patient limited by pain    Behavior During Therapy WFL for tasks assessed/performed                 Past Medical History:  Diagnosis Date   Anxiety    intermittent   blood in stool    CKD (chronic kidney disease), stage III (HCC)    COPD (chronic obstructive pulmonary disease) (HCC)    Diverticulosis    Emphysema lung (HCC)    Fibromyalgia    GERD (gastroesophageal reflux disease)    Glaucoma    Hemorrhoids    HSV-2 (herpes simplex virus 2) infection 2009   Hypothyroidism    OSA on CPAP 07/27/2006   Osteoporosis    Rectal bleeding    with hemorrhoids   Past Surgical History:  Procedure Laterality Date   BUNIONECTOMY  7829,5621   CARPAL TUNNEL RELEASE Right 12/08/2016   Procedure: CARPAL TUNNEL RELEASE;  Surgeon: Cindee Salt, MD;  Location: [facility];  Service: Orthopedics;  Laterality: Right;  REG/FAB   CARPAL TUNNEL RELEASE Left 07/02/2021   Procedure: CARPAL TUNNEL RELEASE LEFT;  Surgeon: Cindee Salt, MD;  Location: [facility];  Service: Orthopedics;  Laterality: Left;  45 MIN   CERVICAL FUSION  2008   hemorrhoids removed     TRIGGER FINGER RELEASE  2008   Patient Active Problem List   Diagnosis Date Noted   Pre-operative clearance 05/26/2023   Primary osteoarthritis of  left knee 02/11/2020   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   Right arm pain 03/07/2019   Diverticulosis 03/06/2019   CKD (chronic kidney disease), stage III (HCC) 09/06/2018   Allergic rhinitis 08/13/2018   Hypothyroid 02/05/2018   Osteopenia with high risk of fracture 04/26/2017   Bilateral tinnitus 04/25/2017   Restrictive lung disease 04/07/2017   Centrilobular emphysema (HCC) 01/19/2017   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   Trigger ring finger of right hand 08/12/2016   Genital herpes 02/16/2016   Primary osteoarthritis of right hip 07/27/2015   ASCVD (arteriosclerotic cardiovascular disease) 02/05/2015   Rectal bleeding 02/05/2015   Pulmonary nodules 02/05/2015   Glaucoma 01/06/2015   Fibromyalgia 01/06/2015   Elevated cholesterol with elevated triglycerides 01/06/2015   Obstructive sleep apnea on CPAP 01/06/2015   Osteoporosis 01/06/2015    PCP: Agapito Games, MD   REFERRING PROVIDER: Monica Becton,*   REFERRING DIAG: H08.65 (ICD-10-CM) - Primary osteoarthritis of right hip   THERAPY DIAG:  Pain in right hip  Stiffness of right hip, not elsewhere classified  Muscle weakness (generalized)  Rationale for Evaluation and Treatment: Rehabilitation  ONSET DATE: One year  SUBJECTIVE:   SUBJECTIVE STATEMENT: Pt states "It's a bad day today". She states the weather changes have been causing her  to hurt more   EVAL: patient will be having THA in a few weeks. Can't sleep on side. Hurts to walk, sit to stand. Have been using a cane for two weeks.  PERTINENT HISTORY: OP, neck surgery, L knee meniscus, fibromyalgia PAIN:  Are you having pain? Yes: NPRS scale: 9/10 Pain location: R hip down to ankle med and lateral hip Pain description: ache Aggravating factors: sit to stand Relieving factors: lying on back with legs straight  PRECAUTIONS: Fall  RED FLAGS: None   WEIGHT BEARING RESTRICTIONS: No  FALLS:  Has patient fallen in  last 6 months? Yes. Number of falls 3 fell on the plane with sit to stand, grabbed on to downspout outside and it was loose and fell outside, in the house and the cat cut me off.   LIVING ENVIRONMENT: Lives with: lives alone Lives in: House/apartment Stairs: No Has following equipment at home: Single point cane and Environmental consultant - 2 wheeled  OCCUPATION: retired  PLOF: Independent  PATIENT GOALS: get around again and clean my own house  NEXT MD VISIT: 06/20/23  OBJECTIVE:  Note: Objective measures were completed at Evaluation unless otherwise noted.  DIAGNOSTIC FINDINGS:  03/02/23 XR IMPRESSION: 1. Severe osteoarthritis of the right hip, substantially progressive from 07/22/2015. 2. Moderate osteoarthritis of the left hip. 3. L4-5 spondylosis.  PATIENT SURVEYS:  FOTO 33 (goal 48)   POSTURE: rounded shoulders and forward head  PALPATION: Lateral and medial hip/thigh  LOWER EXTREMITY ROM:  Active ROM Right eval Left eval Right  1/28  Hip flexion 90* 108 101  Hip extension     Hip abduction     Hip adduction     Hip internal rotation 24* 45 22*  Hip external rotation 26* 34 24*  Knee flexion     Knee extension     Ankle dorsiflexion     Ankle plantarflexion     Ankle inversion     Ankle eversion      (Blank rows = not tested)  LOWER EXTREMITY ZOX:WRUEAVW 5/5 except where indicated  MMT Right eval Left eval Right 1/30 Left  1/30  Hip flexion 4* 4 4 4+  Hip extension      Hip abduction 4-** 5 5   Hip adduction 4+ 5 5   Hip internal rotation NT NT    Hip external rotation NT NT    Knee flexion      Knee extension   4*   Ankle dorsiflexion      Ankle plantarflexion      Ankle inversion      Ankle eversion       (Blank rows = not tested)   FUNCTIONAL TESTS:  5 times sit to stand: 17.7 sec with B UE assist Timed up and go (TUG): 15.1 sec  no AD 06/06/23 5XSTS  with B UE assist 16 seconds  GAIT: Distance walked: 20 Assistive device utilized: Single  point cane Level of assistance: Modified independence Comments: antalgic, short step/stride length  [facility] Adult PT Treatment:                                                DATE: 06/27/23 Therapeutic Exercise/Activity: 5 x STS 13.93 seconds LAQ 2 x 10 bilat Seated march 2 x 10 bilat Seated HS curl red TB 2 x 10 Bridge 2 x 10 Hip add ball  squeeze x 10 FOTO 33 Revised HEP Education on d/c recommendations, HEP and plan to resume PT services after surgery   [facility] Adult PT Treatment:                                                DATE: 06/20/23 Therapeutic Exercise/Activity: Bridge 2 x 15 Hooklying hip add with ball squeeze 10 x 10 sec Hooklying hip ER isometric + black TB x 15 Seated march 2# 2 x 10 Standing hip circles 2 x 10 Rt LE only due to pain with standing on Rt side Heel/toe raises x 15 bilat Standing HS curl Rt only x 15 Hip flex with abd/add over dumbell x 15 Attempted hip IR - painful Discussed home set up and energy conservation techniques for after surgery   [facility] Adult PT Treatment:                                                DATE: 06/16/23 Therapeutic Exercise/Activity: Bridge 2 x 15 Hooklying hip add with ball squeeze 10 x 10 sec Hooklying hip ER isometric + black TB Seated march x 12 LAQ 2 x 10 bilat Standing hip circles 2 x 10 bilat Heel/toe raises x 10 bilat Mini squat 2 x 10 Standing HS curl 2 x 10 bilat STS x 5 - requires UE support this visit   [facility] Adult PT Treatment:                                                DATE: 06/08/2023  Vitals: O2 sats 94 % and HR 99 - did some pursed lip breathing and O2 quickly rose to 98%  Therapeutic Exercise: Bridges 2x15 Hooklying hip ER isometric + black TB (active L clam x 20 Supine  hip add isometric with ball squeeze b/w knees 2x10x5"  Seated marching x 10 B, then with 2# AW Attempted LAQ but painful in lateral R hip STS on airex, no HHA (chair in front if needed) 1x10 (02 sats 93 after this) did deep breaths  and returned to 95% Standing: HS curls 2 x 10 (B) Toe raises 2 x 10, unilateral toe raises 2 x 10 Hip circles CW/CCW 2 x 10 ea B Mini squat 2 x 10 Calf stretch at chair - 10x dynamically then hold for 20 sec     PATIENT EDUCATION:  Education details: PT eval findings, anticipated POC, initial HEP, and log roll  Person educated: Patient Education method: Explanation, Demonstration, and Handouts Education comprehension: verbalized understanding and returned demonstration  HOME EXERCISE PROGRAM: Access Code: [email or web address] URL: [email or web address] Date: 06/27/2023 Prepared by: Reggy Eye  Exercises - Supine Bridge  - 1 x daily - 7 x weekly - 3 sets - 10 reps - Supine Heel Slide with Strap (Mirrored)  - 2 x daily - 7 x weekly - 2 sets - 10 reps - Supine Hip Adduction Isometric with Ball  - 1 x daily - 7 x weekly - 2 sets - 10 reps - Hooklying Isometric Hip External Rotation  - 1 x daily - 7 x weekly - 1-2  sets - 10 reps - 10 sec hold - Supine Quad Set  - 1 x daily - 7 x weekly - 2 sets - 10 reps - 2-3 seconds hold - Sit to Stand with Armchair  - 2 x daily - 7 x weekly - 1 sets - 10 reps  ASSESSMENT:  CLINICAL IMPRESSION: Pt has increased strength and is independent with HEP. Revised HEP for pt to use after surgery. Pt with good understanding. She is ready to d/c to surgery  Eval: Patient is a 83 y.o. female who was seen today for physical therapy evaluation and treatment for chronic right hip pain due to OA. She is scheduled to have a THA in the coming weeks. She presently has constant pain that is worse with walking, bed mobility and sit to stand transfers and she has had three falls in the past six months. She uses a cane now for stability. She will benefit from skilled PT to address these deficits.    OBJECTIVE IMPAIRMENTS: decreased activity tolerance, decreased balance, difficulty walking, decreased ROM, decreased strength, increased muscle spasms, impaired  flexibility, and pain.     GOALS: Goals reviewed with patient? Yes  SHORT TERM GOALS: Target date: 05/30/2023   Patient will be independent with initial HEP. Baseline:  Goal status: MET   LONG TERM GOALS: Target date: 06/27/2023   Patient will be independent with advanced/ongoing HEP to improve outcomes and carryover.  Baseline:  Goal status: MET  2.  Patient will report 25% improvement in R hip pain to improve QOL prior to surgery. Baseline:  Goal status: NOT MET  3.  Patient will demonstrate improved functional LE strength as demonstrated by improved 5xSTS by 2-3 seconds. Baseline: 17.7 sec, 1/28 16 sec  Goal status: MET (13.93 seconds on 2/18)  4.  Improved LE strength to 4+/5 Baseline:  Goal status: NOT MET  5.  Patient will report improved FOTO to 40 prior to surgery to demonstrate improved functional ability. Baseline: 33 Goal status: INITIAL    PLAN:  PT FREQUENCY: 2x/week  PT DURATION: 6 weeks  PLANNED INTERVENTIONS: 97164- PT Re-evaluation, 97110-Therapeutic exercises, 97530- Therapeutic activity, 97112- Neuromuscular re-education, 97535- Self Care, [address]- Manual therapy, [phone]- Gait training, [phone]- Aquatic Therapy, 97014- Electrical stimulation (unattended), Patient/Family education, Balance training, Stair training, Taping, Dry Needling, Joint mobilization, Cryotherapy, and Moist heat  PLAN FOR NEXT SESSION: d/c to surgery   PHYSICAL THERAPY DISCHARGE SUMMARY  Visits from Start of Care: 10  Current functional level related to goals / functional outcomes: Improved strength and knowledge of HEP   Remaining deficits: See above   Education / Equipment: HEP   Patient agrees to discharge. Patient goals were partially met. Patient is being discharged due to being pleased with the current functional level.  Reggy Eye, PT,DPT02/18/252:37 PM

## 2023-06-28 ENCOUNTER — Other Ambulatory Visit: Payer: Self-pay

## 2023-06-28 ENCOUNTER — Ambulatory Visit (HOSPITAL_COMMUNITY)
Admission: RE | Admit: 2023-06-28 | Discharge: 2023-06-28 | Disposition: A | Discharge status: Home / Self Care | Payer: Medicare PPO | Source: Ambulatory Visit | Attending: Orthopedic Surgery | Admitting: Orthopedic Surgery

## 2023-06-28 ENCOUNTER — Encounter (HOSPITAL_COMMUNITY): Payer: Self-pay

## 2023-06-28 ENCOUNTER — Encounter (HOSPITAL_COMMUNITY)
Admission: RE | Admit: 2023-06-28 | Discharge: 2023-06-28 | Disposition: A | Discharge status: Home / Self Care | Payer: Medicare PPO | Source: Ambulatory Visit | Attending: Orthopedic Surgery

## 2023-06-28 VITALS — BP 156/80 | HR 85 | Temp 98.1°F | Resp 16 | Ht 62.0 in | Wt 140.0 lb

## 2023-06-28 DIAGNOSIS — J449 Chronic obstructive pulmonary disease, unspecified: Secondary | ICD-10-CM | POA: Diagnosis not present

## 2023-06-28 DIAGNOSIS — Z01818 Encounter for other preprocedural examination: Secondary | ICD-10-CM | POA: Diagnosis not present

## 2023-06-28 HISTORY — DX: Unspecified osteoarthritis, unspecified site: M19.90

## 2023-06-28 LAB — CBC
HCT: 44.3 % (ref 36.0–46.0)
Hemoglobin: 14.3 g/dL (ref 12.0–15.0)
MCH: 31.9 pg (ref 26.0–34.0)
MCHC: 32.3 g/dL (ref 30.0–36.0)
MCV: 98.9 fL (ref 80.0–100.0)
Platelets: 219 10*3/uL (ref 150–400)
RBC: 4.48 MIL/uL (ref 3.87–5.11)
RDW: 12.6 % (ref 11.5–15.5)
WBC: 5.2 10*3/uL (ref 4.0–10.5)
nRBC: 0 % (ref 0.0–0.2)

## 2023-06-28 LAB — SURGICAL PCR SCREEN
MRSA, PCR: NEGATIVE
Staphylococcus aureus: NEGATIVE

## 2023-07-02 NOTE — H&P (Signed)
 TOTAL HIP ADMISSION H&P  Patient is admitted for right total hip arthroplasty.  Subjective:  Chief Complaint: right hip pain  HPI: [name] [name], 83 y.o. female, has a history of pain and functional disability in the right hip(s) due to arthritis and patient has failed non-surgical conservative treatments for greater than 12 weeks to include NSAID's and/or analgesics, flexibility and strengthening excercises, supervised PT with diminished ADL's post treatment, weight reduction as appropriate, and activity modification.  Onset of symptoms was gradual starting 4 years ago with gradually worsening course since that time.The patient noted no past surgery on the right hip(s).  Patient currently rates pain in the right hip at 7 out of 10 with activity. Patient has night pain, worsening of pain with activity and weight bearing, trendelenberg gait, pain that interfers with activities of daily living, and pain with passive range of motion. Patient has evidence of subchondral sclerosis and joint space narrowing by imaging studies. This condition presents safety issues increasing the risk of falls..  There is no current active infection.  Patient Active Problem List   Diagnosis Date Noted   Pre-operative clearance 05/26/2023   Primary osteoarthritis of left knee 02/11/2020   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   Right arm pain 03/07/2019   Diverticulosis 03/06/2019   CKD (chronic kidney disease), stage III (HCC) 09/06/2018   Allergic rhinitis 08/13/2018   Hypothyroid 02/05/2018   Osteopenia with high risk of fracture 04/26/2017   Bilateral tinnitus 04/25/2017   Restrictive lung disease 04/07/2017   Centrilobular emphysema (HCC) 01/19/2017   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   Trigger ring finger of right hand 08/12/2016   Genital herpes 02/16/2016   Primary osteoarthritis of right hip 07/27/2015   ASCVD (arteriosclerotic cardiovascular disease) 02/05/2015   Rectal bleeding  02/05/2015   Pulmonary nodules 02/05/2015   Glaucoma 01/06/2015   Fibromyalgia 01/06/2015   Elevated cholesterol with elevated triglycerides 01/06/2015   Obstructive sleep apnea on CPAP 01/06/2015   Osteoporosis 01/06/2015   Past Medical History:  Diagnosis Date   Anxiety    intermittent   Arthritis    blood in stool    CKD (chronic kidney disease), stage III (HCC)    COPD (chronic obstructive pulmonary disease) (HCC)    Diverticulosis    Emphysema lung (HCC)    Fibromyalgia    GERD (gastroesophageal reflux disease)    Glaucoma    Hemorrhoids    HSV-2 (herpes simplex virus 2) infection 2009   Hypothyroidism    OSA on CPAP 07/27/2006   Osteoporosis    Rectal bleeding    with hemorrhoids    Past Surgical History:  Procedure Laterality Date   BUNIONECTOMY  1610,9604   CARPAL TUNNEL RELEASE Right 12/08/2016   Procedure: CARPAL TUNNEL RELEASE;  Surgeon: Cindee Salt, MD;  Location: [facility];  Service: Orthopedics;  Laterality: Right;  REG/FAB   CARPAL TUNNEL RELEASE Left 07/02/2021   Procedure: CARPAL TUNNEL RELEASE LEFT;  Surgeon: Cindee Salt, MD;  Location: [facility];  Service: Orthopedics;  Laterality: Left;  45 MIN   CERVICAL FUSION  2008   EYE SURGERY     cataract and glaucoma   hemorrhoids removed     MENISCUS REPAIR Left    TRIGGER FINGER RELEASE  2008    No current facility-administered medications for this encounter.   Current Outpatient Medications  Medication Sig Dispense Refill Last Dose/Taking   acetaminophen (TYLENOL) 650 MG CR tablet Take 1 tablet (650 mg total) by  mouth every 8 (eight) hours as needed for pain.   Taking As Needed   albuterol (VENTOLIN HFA) 108 (90 Base) MCG/ACT inhaler INHALE 2 PUFFS INTO THE LUNGS EVERY 4 HOURS AS NEEDED FOR WHEEZING OR SHORTNESS OF BREATH. 18 each 2 Taking   atorvastatin (LIPITOR) 10 MG tablet TAKE 1 TABLET BY MOUTH EVERY DAY 90 tablet 3 Taking   bifidobacterium infantis (ALIGN) capsule  Take 1 capsule by mouth daily.   Taking   Calcium-Phosphorus-Vitamin D (CALCIUM/D3 ADULT GUMMIES PO) Take 2 each by mouth daily. 2 gummies daily with meal   Taking   Cholecalciferol (VITAMIN D PO) Take 2,000 mg by mouth daily.   Taking   Collagen-Vitamin C-Biotin (COLLAGEN PO) Take 1 Scoop by mouth daily.   Taking   DULoxetine (CYMBALTA) 60 MG capsule TAKE 1 CAPSULE BY MOUTH EVERY DAY 90 capsule 1 Taking   levothyroxine (SYNTHROID) 75 MCG tablet TAKE 1 TABLET BY MOUTH EVERY DAY BEFORE BREAKFAST 90 tablet 2 Taking   loratadine (CLARITIN) 10 MG tablet Take 10 mg by mouth daily.   Taking   Menatetrenone (VITAMIN K2) 100 MCG TABS Take 1 tablet by mouth daily.   Taking   Multiple Vitamin (MULTIVITAMINS PO) Take 1 tablet by mouth daily.   Taking   polyethylene glycol (MIRALAX / GLYCOLAX) packet Take 17 g by mouth daily as needed for moderate constipation.   Taking As Needed   Timolol Maleate PF 0.5 % SOLN Place 1 drop into the left eye in the morning and at bedtime.   Taking   Tiotropium Bromide-Olodaterol (STIOLTO RESPIMAT) 2.5-2.5 MCG/ACT AERS Inhale 2 puffs into the lungs daily. 12 g 3 Taking   vitamin B-12 (CYANOCOBALAMIN) 100 MCG tablet Take 100 mcg by mouth daily.   Taking   Allergies  Allergen Reactions   Alphagan P [Brimonidine Tartrate]     rash   Netarsudil-Latanoprost Other (See Comments)    Causes eye irritation, eyelid swelling    Codeine Rash   Penicillins Rash   Sulfa Antibiotics Rash    Social History   Tobacco Use   Smoking status: Former    Current packs/day: 0.00    Average packs/day: 2.0 packs/day for 25.0 years (50.0 ttl pk-yrs)    Types: Cigarettes    Start date: 03/07/1956    Quit date: 03/07/1981    Years since quitting: 42.3   Smokeless tobacco: Never  Substance Use Topics   Alcohol use: Not Currently    Comment: rare    Family History  Problem Relation Age of Onset   Heart attack Father    CAD Father    Hyperlipidemia Mother    Hypertension Mother     CAD Mother      Review of Systems -Negative as it relates to the HPI  Objective:  Physical Exam Well-developed well-nourished patient in no acute distress. Alert and oriented x3 HEENT:within normal limits Cardiac: Regular rate and rhythm Pulmonary: Lungs clear to auscultation Abdomen: Soft and nontender.  Normal active bowel sounds  Musculoskeletal:Right hip exam: She has pain with internal and external rotation of the right hip.  She had plates with an antalgic gait secondary to right hip pain.  Vital signs in last 24 hours:    Labs:  Recent Results (from the past 2160 hours)  CBC with Differential/Platelet     Status: Abnormal   Collection Time: 05/26/23  4:08 PM  Result Value Ref Range   WBC 4.3 3.4 - 10.8 x10E3/uL   RBC 4.04 3.77 - 5.28  x10E6/uL   Hemoglobin 12.9 11.1 - 15.9 g/dL   Hematocrit 16.1 09.6 - 46.6 %   MCV 98 (H) 79 - 97 fL   MCH 31.9 26.6 - 33.0 pg   MCHC 32.6 31.5 - 35.7 g/dL   RDW 04.5 40.9 - 81.1 %   Platelets 230 150 - 450 x10E3/uL   Neutrophils 44 Not Estab. %   Lymphs 37 Not Estab. %   Monocytes 15 Not Estab. %   Eos 3 Not Estab. %   Basos 1 Not Estab. %   Neutrophils Absolute 1.9 1.4 - 7.0 x10E3/uL   Lymphocytes Absolute 1.6 0.7 - 3.1 x10E3/uL   Monocytes Absolute 0.6 0.1 - 0.9 x10E3/uL   EOS (ABSOLUTE) 0.1 0.0 - 0.4 x10E3/uL   Basophils Absolute 0.0 0.0 - 0.2 x10E3/uL   Immature Granulocytes 0 Not Estab. %   Immature Grans (Abs) 0.0 0.0 - 0.1 x10E3/uL  CMP14+EGFR     Status: None   Collection Time: 05/26/23  4:08 PM  Result Value Ref Range   Glucose 86 70 - 99 mg/dL   BUN 17 8 - 27 mg/dL   Creatinine, Ser 9.14 0.57 - 1.00 mg/dL   eGFR 70 >78 GN/FAO/1.30   BUN/Creatinine Ratio 21 12 - 28   Sodium 144 134 - 144 mmol/L   Potassium 4.3 3.5 - 5.2 mmol/L   Chloride 102 96 - 106 mmol/L   CO2 24 20 - 29 mmol/L   Calcium 9.8 8.7 - 10.3 mg/dL   Total Protein 6.9 6.0 - 8.5 g/dL   Albumin 4.2 3.7 - 4.7 g/dL   Globulin, Total 2.7 1.5 - 4.5 g/dL    Bilirubin Total 0.8 0.0 - 1.2 mg/dL   Alkaline Phosphatase 121 44 - 121 IU/L   AST 22 0 - 40 IU/L   ALT 17 0 - 32 IU/L  Hemoglobin A1c     Status: None   Collection Time: 05/26/23  4:08 PM  Result Value Ref Range   Hgb A1c MFr Bld 5.5 4.8 - 5.6 %    Comment:          Prediabetes: 5.7 - 6.4          Diabetes: >6.4          Glycemic control for adults with diabetes: <7.0    Est. average glucose Bld gHb Est-mCnc 111 mg/dL  Type and screen [facility]     Status: None   Collection Time: 06/28/23 11:06 AM  Result Value Ref Range   ABO/RH(D) A POS    Antibody Screen NEG    Sample Expiration 07/12/2023,2359    Extend sample reason      NO TRANSFUSIONS OR PREGNANCY IN THE PAST 3 MONTHS Performed at [facility], [address], [place], [place] [address]   CBC per protocol     Status: None   Collection Time: 06/28/23 11:06 AM  Result Value Ref Range   WBC 5.2 4.0 - 10.5 K/uL   RBC 4.48 3.87 - 5.11 MIL/uL   Hemoglobin 14.3 12.0 - 15.0 g/dL   HCT 46.9 62.9 - 52.8 %   MCV 98.9 80.0 - 100.0 fL   MCH 31.9 26.0 - 34.0 pg   MCHC 32.3 30.0 - 36.0 g/dL   RDW 41.3 24.4 - 01.0 %   Platelets 219 150 - 400 K/uL   nRBC 0.0 0.0 - 0.2 %    Comment: Performed at [facility], [address] [place], [place]  [address]  Surgical pcr screen     Status: None   Collection Time: 06/28/23 11:38 AM   Specimen: Nasal Mucosa; Nasal Swab  Result Value Ref Range   MRSA, PCR NEGATIVE NEGATIVE   Staphylococcus aureus NEGATIVE NEGATIVE    Comment: (NOTE) The Xpert SA Assay (FDA approved for NASAL specimens in patients 75 years of age and older), is one component of a comprehensive surveillance program. It is not intended to diagnose infection nor to guide or monitor treatment. Performed at [facility], [address], [place], [place] [address]     Estimated body mass index is 25.61 kg/m as calculated from the following:    Height as of 06/28/23: 5\' 2"  (1.575 m).   Weight as of 06/28/23: 63.5 kg.   Imaging Review Plain radiographs demonstrate severe degenerative joint disease of the right hip(s). The bone quality appears to be good for age and reported activity level.      Assessment/Plan:  End stage arthritis, right hip(s)  The patient history, physical examination, clinical judgement of the provider and imaging studies are consistent with end stage degenerative joint disease of the right hip(s) and total hip arthroplasty is deemed medically necessary. The treatment options including medical management, injection therapy, arthroscopy and arthroplasty were discussed at length. The risks and benefits of total hip arthroplasty were presented and reviewed. The risks due to aseptic loosening, infection, stiffness, dislocation/subluxation,  thromboembolic complications and other imponderables were discussed.  The patient acknowledged the explanation, agreed to proceed with the plan and consent was signed. Patient is being admitted for inpatient treatment for surgery, pain control, PT, OT, prophylactic antibiotics, VTE prophylaxis, progressive ambulation and ADL's and discharge planning.The patient is planning to be discharged home with home health services.The patient will spend one night in the hospital and will be discharged home more than likely on postop day #1.

## 2023-07-03 ENCOUNTER — Ambulatory Visit (HOSPITAL_COMMUNITY): Payer: Medicare PPO | Admitting: Anesthesiology

## 2023-07-03 ENCOUNTER — Other Ambulatory Visit: Payer: Self-pay

## 2023-07-03 ENCOUNTER — Encounter (HOSPITAL_COMMUNITY)
Admission: RE | Disposition: A | Discharge status: Home Health | Payer: Self-pay | Source: Home / Self Care | Attending: Orthopedic Surgery

## 2023-07-03 ENCOUNTER — Observation Stay (HOSPITAL_COMMUNITY)
Admission: RE | Admit: 2023-07-03 | Discharge: 2023-07-04 | Disposition: A | Discharge status: Home Health | Payer: Medicare PPO | Attending: Orthopedic Surgery | Admitting: Orthopedic Surgery

## 2023-07-03 ENCOUNTER — Encounter (HOSPITAL_COMMUNITY): Payer: Self-pay | Admitting: Orthopedic Surgery

## 2023-07-03 ENCOUNTER — Ambulatory Visit (HOSPITAL_COMMUNITY): Payer: Medicare PPO

## 2023-07-03 DIAGNOSIS — G4730 Sleep apnea, unspecified: Secondary | ICD-10-CM | POA: Diagnosis not present

## 2023-07-03 DIAGNOSIS — Z96641 Presence of right artificial hip joint: Secondary | ICD-10-CM | POA: Diagnosis not present

## 2023-07-03 DIAGNOSIS — N183 Chronic kidney disease, stage 3 unspecified: Secondary | ICD-10-CM | POA: Diagnosis not present

## 2023-07-03 DIAGNOSIS — E039 Hypothyroidism, unspecified: Secondary | ICD-10-CM

## 2023-07-03 DIAGNOSIS — J449 Chronic obstructive pulmonary disease, unspecified: Secondary | ICD-10-CM

## 2023-07-03 DIAGNOSIS — Z79899 Other long term (current) drug therapy: Secondary | ICD-10-CM | POA: Diagnosis not present

## 2023-07-03 DIAGNOSIS — M1611 Unilateral primary osteoarthritis, right hip: Secondary | ICD-10-CM

## 2023-07-03 DIAGNOSIS — Z87891 Personal history of nicotine dependence: Secondary | ICD-10-CM | POA: Insufficient documentation

## 2023-07-03 HISTORY — PX: TOTAL HIP ARTHROPLASTY: SHX124

## 2023-07-03 LAB — TYPE AND SCREEN
ABO/RH(D): A POS
Antibody Screen: NEGATIVE

## 2023-07-03 LAB — ABO/RH: ABO/RH(D): A POS

## 2023-07-03 SURGERY — ARTHROPLASTY, HIP, TOTAL, ANTERIOR APPROACH
Anesthesia: Spinal | Site: Hip | Laterality: Right

## 2023-07-03 MED ORDER — TRANEXAMIC ACID-NACL 1000-0.7 MG/100ML-% IV SOLN
1000.0000 mg | Freq: Once | INTRAVENOUS | Status: AC
  Administered 2023-07-03: 1000 mg via INTRAVENOUS
  Filled 2023-07-03: qty 100

## 2023-07-03 MED ORDER — DOCUSATE SODIUM 100 MG PO CAPS
100.0000 mg | ORAL_CAPSULE | Freq: Two times a day (BID) | ORAL | Status: DC
  Administered 2023-07-03 – 2023-07-04 (×2): 100 mg via ORAL
  Filled 2023-07-03 (×2): qty 1

## 2023-07-03 MED ORDER — BUPIVACAINE LIPOSOME 1.3 % IJ SUSP: 10.0000 mL | Freq: Once | INTRAMUSCULAR | Status: DC

## 2023-07-03 MED ORDER — TIMOLOL MALEATE 0.5 % OP SOLN
1.0000 [drp] | Freq: Two times a day (BID) | OPHTHALMIC | Status: DC
  Administered 2023-07-03 – 2023-07-04 (×2): 1 [drp] via OPHTHALMIC
  Filled 2023-07-03: qty 5

## 2023-07-03 MED ORDER — ASPIRIN 81 MG PO CHEW
81.0000 mg | CHEWABLE_TABLET | Freq: Two times a day (BID) | ORAL | Status: DC
  Administered 2023-07-03 – 2023-07-04 (×2): 81 mg via ORAL
  Filled 2023-07-03 (×2): qty 1

## 2023-07-03 MED ORDER — OXYCODONE HCL 5 MG/5ML PO SOLN: 5.0000 mg | Freq: Once | ORAL | Status: DC | PRN

## 2023-07-03 MED ORDER — PROPOFOL 10 MG/ML IV BOLUS
INTRAVENOUS | Status: DC | PRN
  Administered 2023-07-03: 20 mg via INTRAVENOUS
  Administered 2023-07-03: 15 mg via INTRAVENOUS
  Administered 2023-07-03: 20 mg via INTRAVENOUS
  Administered 2023-07-03: 25 mg via INTRAVENOUS
  Administered 2023-07-03: 20 mg via INTRAVENOUS

## 2023-07-03 MED ORDER — PHENYLEPHRINE 80 MCG/ML (10ML) SYRINGE FOR IV PUSH (FOR BLOOD PRESSURE SUPPORT)
PREFILLED_SYRINGE | INTRAVENOUS | Status: DC | PRN
  Administered 2023-07-03: 160 ug via INTRAVENOUS
  Administered 2023-07-03 (×4): 80 ug via INTRAVENOUS

## 2023-07-03 MED ORDER — DEXAMETHASONE SODIUM PHOSPHATE 10 MG/ML IJ SOLN
INTRAMUSCULAR | Status: DC | PRN
Start: 2023-07-03 — End: 2023-07-03
  Administered 2023-07-03: 5 mg via INTRAVENOUS

## 2023-07-03 MED ORDER — SODIUM CHLORIDE 0.9 % IV SOLN: INTRAVENOUS | Status: DC

## 2023-07-03 MED ORDER — ACETAMINOPHEN 325 MG PO TABS: 325.0000 mg | ORAL_TABLET | Freq: Four times a day (QID) | ORAL | Status: DC | PRN

## 2023-07-03 MED ORDER — BUPIVACAINE-EPINEPHRINE 0.25% -1:200000 IJ SOLN
INTRAMUSCULAR | Status: AC
  Filled 2023-07-03: qty 1

## 2023-07-03 MED ORDER — WATER FOR IRRIGATION, STERILE IR SOLN
Status: DC | PRN
  Administered 2023-07-03: 1000 mL

## 2023-07-03 MED ORDER — BUPIVACAINE-EPINEPHRINE 0.25% -1:200000 IJ SOLN
INTRAMUSCULAR | Status: DC | PRN
  Administered 2023-07-03: 30 mL

## 2023-07-03 MED ORDER — LACTATED RINGERS IV SOLN: INTRAVENOUS | Status: AC

## 2023-07-03 MED ORDER — TRANEXAMIC ACID-NACL 1000-0.7 MG/100ML-% IV SOLN
1000.0000 mg | INTRAVENOUS | Status: AC
Start: 2023-07-03 — End: 2023-07-03
  Administered 2023-07-03: 1000 mg via INTRAVENOUS
  Filled 2023-07-03: qty 100

## 2023-07-03 MED ORDER — PROPOFOL 500 MG/50ML IV EMUL
INTRAVENOUS | Status: DC | PRN
  Administered 2023-07-03: 75 ug/kg/min via INTRAVENOUS

## 2023-07-03 MED ORDER — PHENYLEPHRINE 80 MCG/ML (10ML) SYRINGE FOR IV PUSH (FOR BLOOD PRESSURE SUPPORT)
PREFILLED_SYRINGE | INTRAVENOUS | Status: AC
  Filled 2023-07-03: qty 10

## 2023-07-03 MED ORDER — DOCUSATE SODIUM 100 MG PO CAPS: 100.0000 mg | ORAL_CAPSULE | Freq: Two times a day (BID) | ORAL | 0 refills | Status: DC

## 2023-07-03 MED ORDER — ACETAMINOPHEN 500 MG PO TABS
500.0000 mg | ORAL_TABLET | Freq: Four times a day (QID) | ORAL | Status: DC
  Administered 2023-07-04 (×3): 500 mg via ORAL
  Filled 2023-07-03 (×2): qty 1

## 2023-07-03 MED ORDER — ARFORMOTEROL TARTRATE 15 MCG/2ML IN NEBU
15.0000 ug | INHALATION_SOLUTION | Freq: Two times a day (BID) | RESPIRATORY_TRACT | Status: DC
  Administered 2023-07-03: 15 ug via RESPIRATORY_TRACT
  Filled 2023-07-03: qty 2

## 2023-07-03 MED ORDER — METHOCARBAMOL 500 MG PO TABS
ORAL_TABLET | ORAL | Status: AC
  Filled 2023-07-03: qty 1

## 2023-07-03 MED ORDER — METHOCARBAMOL 1000 MG/10ML IJ SOLN: 500.0000 mg | Freq: Four times a day (QID) | INTRAMUSCULAR | Status: DC | PRN

## 2023-07-03 MED ORDER — LIDOCAINE HCL (PF) 2 % IJ SOLN
INTRAMUSCULAR | Status: AC
  Filled 2023-07-03: qty 5

## 2023-07-03 MED ORDER — CELECOXIB 100 MG PO CAPS: 100.0000 mg | ORAL_CAPSULE | Freq: Every day | ORAL | 0 refills | Status: DC

## 2023-07-03 MED ORDER — CEFAZOLIN SODIUM-DEXTROSE 2-4 GM/100ML-% IV SOLN
2.0000 g | Freq: Four times a day (QID) | INTRAVENOUS | Status: AC
  Administered 2023-07-03 – 2023-07-04 (×2): 2 g via INTRAVENOUS
  Filled 2023-07-03 (×2): qty 100

## 2023-07-03 MED ORDER — OXYCODONE HCL 5 MG PO TABS: 5.0000 mg | ORAL_TABLET | Freq: Once | ORAL | Status: DC | PRN

## 2023-07-03 MED ORDER — ONDANSETRON HCL 4 MG/2ML IJ SOLN
INTRAMUSCULAR | Status: DC | PRN
  Administered 2023-07-03: 4 mg via INTRAVENOUS

## 2023-07-03 MED ORDER — ALUM & MAG HYDROXIDE-SIMETH 200-200-20 MG/5ML PO SUSP: 30.0000 mL | ORAL | Status: DC | PRN

## 2023-07-03 MED ORDER — BUPIVACAINE IN DEXTROSE 0.75-8.25 % IT SOLN
INTRATHECAL | Status: DC | PRN
  Administered 2023-07-03: 1.6 mL via INTRATHECAL

## 2023-07-03 MED ORDER — HYDROCODONE-ACETAMINOPHEN 5-325 MG PO TABS: 1.0000 | ORAL_TABLET | Freq: Four times a day (QID) | ORAL | 0 refills | Status: DC | PRN

## 2023-07-03 MED ORDER — ALBUTEROL SULFATE (2.5 MG/3ML) 0.083% IN NEBU: 2.5000 mg | INHALATION_SOLUTION | RESPIRATORY_TRACT | Status: DC | PRN

## 2023-07-03 MED ORDER — ACETAMINOPHEN 500 MG PO TABS
1000.0000 mg | ORAL_TABLET | Freq: Once | ORAL | Status: DC
  Filled 2023-07-03: qty 2

## 2023-07-03 MED ORDER — ONDANSETRON HCL 4 MG PO TABS: 4.0000 mg | ORAL_TABLET | Freq: Four times a day (QID) | ORAL | Status: DC | PRN

## 2023-07-03 MED ORDER — POLYETHYLENE GLYCOL 3350 17 G PO PACK: 17.0000 g | PACK | Freq: Every day | ORAL | Status: DC | PRN

## 2023-07-03 MED ORDER — ORAL CARE MOUTH RINSE: 15.0000 mL | Freq: Once | OROMUCOSAL | Status: AC

## 2023-07-03 MED ORDER — UMECLIDINIUM BROMIDE 62.5 MCG/ACT IN AEPB
1.0000 | INHALATION_SPRAY | Freq: Every day | RESPIRATORY_TRACT | Status: DC
  Filled 2023-07-03: qty 7

## 2023-07-03 MED ORDER — MORPHINE SULFATE (PF) 2 MG/ML IV SOLN
0.5000 mg | INTRAVENOUS | Status: DC | PRN
  Administered 2023-07-04: 0.5 mg via INTRAVENOUS
  Filled 2023-07-03: qty 1

## 2023-07-03 MED ORDER — ONDANSETRON HCL 4 MG/2ML IJ SOLN: 4.0000 mg | Freq: Four times a day (QID) | INTRAMUSCULAR | Status: DC | PRN

## 2023-07-03 MED ORDER — DEXAMETHASONE SODIUM PHOSPHATE 10 MG/ML IJ SOLN
INTRAMUSCULAR | Status: AC
  Filled 2023-07-03: qty 1

## 2023-07-03 MED ORDER — ONDANSETRON HCL 4 MG/2ML IJ SOLN: 4.0000 mg | Freq: Once | INTRAMUSCULAR | Status: DC | PRN

## 2023-07-03 MED ORDER — BUPIVACAINE LIPOSOME 1.3 % IJ SUSP
INTRAMUSCULAR | Status: DC | PRN
  Administered 2023-07-03: 10 mL

## 2023-07-03 MED ORDER — ZOLPIDEM TARTRATE 5 MG PO TABS
5.0000 mg | ORAL_TABLET | Freq: Every evening | ORAL | Status: DC | PRN
  Administered 2023-07-04: 5 mg via ORAL
  Filled 2023-07-03: qty 1

## 2023-07-03 MED ORDER — ASPIRIN 81 MG PO TBEC: 81.0000 mg | DELAYED_RELEASE_TABLET | Freq: Two times a day (BID) | ORAL | 0 refills | Status: DC

## 2023-07-03 MED ORDER — 0.9 % SODIUM CHLORIDE (POUR BTL) OPTIME
TOPICAL | Status: DC | PRN
  Administered 2023-07-03: 1000 mL

## 2023-07-03 MED ORDER — BISACODYL 5 MG PO TBEC: 5.0000 mg | DELAYED_RELEASE_TABLET | Freq: Every day | ORAL | Status: DC | PRN

## 2023-07-03 MED ORDER — DULOXETINE HCL 60 MG PO CPEP
60.0000 mg | ORAL_CAPSULE | Freq: Every day | ORAL | Status: DC
  Administered 2023-07-04: 60 mg via ORAL
  Filled 2023-07-03: qty 1

## 2023-07-03 MED ORDER — LIDOCAINE 2% (20 MG/ML) 5 ML SYRINGE
INTRAMUSCULAR | Status: DC | PRN
  Administered 2023-07-03: 20 mg via INTRAVENOUS

## 2023-07-03 MED ORDER — BUPIVACAINE LIPOSOME 1.3 % IJ SUSP
INTRAMUSCULAR | Status: AC
  Filled 2023-07-03: qty 10

## 2023-07-03 MED ORDER — METHOCARBAMOL 500 MG PO TABS
500.0000 mg | ORAL_TABLET | Freq: Four times a day (QID) | ORAL | Status: DC | PRN
  Administered 2023-07-03 (×2): 500 mg via ORAL
  Filled 2023-07-03: qty 1

## 2023-07-03 MED ORDER — ONDANSETRON HCL 4 MG/2ML IJ SOLN
INTRAMUSCULAR | Status: AC
  Filled 2023-07-03: qty 2

## 2023-07-03 MED ORDER — DEXAMETHASONE SODIUM PHOSPHATE 10 MG/ML IJ SOLN
10.0000 mg | Freq: Two times a day (BID) | INTRAMUSCULAR | Status: AC
Start: 2023-07-03 — End: 2023-07-04
  Administered 2023-07-03 – 2023-07-04 (×2): 10 mg via INTRAVENOUS
  Filled 2023-07-03 (×2): qty 1

## 2023-07-03 MED ORDER — ALBUTEROL SULFATE (2.5 MG/3ML) 0.083% IN NEBU
2.5000 mg | INHALATION_SOLUTION | RESPIRATORY_TRACT | Status: DC
  Administered 2023-07-03: 2.5 mg via RESPIRATORY_TRACT
  Filled 2023-07-03: qty 3

## 2023-07-03 MED ORDER — POVIDONE-IODINE 10 % EX SWAB
2.0000 | Freq: Once | CUTANEOUS | Status: AC
  Administered 2023-07-03: 2 via TOPICAL

## 2023-07-03 MED ORDER — PROPOFOL 1000 MG/100ML IV EMUL
INTRAVENOUS | Status: AC
Start: 2023-07-03 — End: ?
  Filled 2023-07-03: qty 100

## 2023-07-03 MED ORDER — LEVOTHYROXINE SODIUM 75 MCG PO TABS
75.0000 ug | ORAL_TABLET | Freq: Every day | ORAL | Status: DC
  Administered 2023-07-04: 75 ug via ORAL
  Filled 2023-07-03: qty 1

## 2023-07-03 MED ORDER — FENTANYL CITRATE PF 50 MCG/ML IJ SOSY: 25.0000 ug | PREFILLED_SYRINGE | INTRAMUSCULAR | Status: DC | PRN

## 2023-07-03 MED ORDER — TIZANIDINE HCL 2 MG PO TABS: 2.0000 mg | ORAL_TABLET | Freq: Three times a day (TID) | ORAL | 0 refills | Status: DC | PRN

## 2023-07-03 MED ORDER — CHLORHEXIDINE GLUCONATE 0.12 % MT SOLN
15.0000 mL | Freq: Once | OROMUCOSAL | Status: AC
  Administered 2023-07-03: 15 mL via OROMUCOSAL

## 2023-07-03 MED ORDER — HYDROCODONE-ACETAMINOPHEN 5-325 MG PO TABS
1.0000 | ORAL_TABLET | ORAL | Status: DC | PRN
  Administered 2023-07-03: 1 via ORAL
  Administered 2023-07-03 – 2023-07-04 (×3): 2 via ORAL
  Filled 2023-07-03 (×3): qty 2

## 2023-07-03 MED ORDER — HYDROCODONE-ACETAMINOPHEN 5-325 MG PO TABS
ORAL_TABLET | ORAL | Status: AC
Start: 2023-07-03 — End: 2023-07-04
  Filled 2023-07-03: qty 1

## 2023-07-03 MED ORDER — KETAMINE HCL 50 MG/5ML IJ SOSY
PREFILLED_SYRINGE | INTRAMUSCULAR | Status: AC
  Filled 2023-07-03: qty 5

## 2023-07-03 MED ORDER — KETAMINE HCL 50 MG/5ML IJ SOSY
PREFILLED_SYRINGE | INTRAMUSCULAR | Status: DC | PRN
Start: 2023-07-03 — End: 2023-07-03
  Administered 2023-07-03: 10 mg via INTRAVENOUS
  Administered 2023-07-03 (×2): 5 mg via INTRAVENOUS

## 2023-07-03 MED ORDER — CEFAZOLIN SODIUM-DEXTROSE 2-4 GM/100ML-% IV SOLN
2.0000 g | INTRAVENOUS | Status: AC
  Administered 2023-07-03: 2 g via INTRAVENOUS
  Filled 2023-07-03: qty 100

## 2023-07-03 MED ORDER — MAGNESIUM CITRATE PO SOLN: 1.0000 | Freq: Once | ORAL | Status: DC | PRN

## 2023-07-03 SURGICAL SUPPLY — 43 items
BAG COUNTER SPONGE SURGICOUNT (BAG) IMPLANT
BAG ZIPLOCK 12X15 (MISCELLANEOUS) IMPLANT
BENZOIN TINCTURE PRP APPL 2/3 (GAUZE/BANDAGES/DRESSINGS) IMPLANT
BLADE SAW SGTL 18X1.27X75 (BLADE) ×2 IMPLANT
CATH PEDI SILICON 3C 10FR (CATHETERS) IMPLANT
CLSR STERI-STRIP ANTIMIC 1/2X4 (GAUZE/BANDAGES/DRESSINGS) IMPLANT
COVER PERINEAL POST (MISCELLANEOUS) ×2 IMPLANT
COVER SURGICAL LIGHT HANDLE (MISCELLANEOUS) ×2 IMPLANT
CUP ACETABULAR GRIPTON 100 52 (Orthopedic Implant) IMPLANT
DRAPE FOOT SWITCH (DRAPES) ×2 IMPLANT
DRAPE STERI IOBAN 125X83 (DRAPES) ×2 IMPLANT
DRAPE U-SHAPE 47X51 STRL (DRAPES) ×4 IMPLANT
DRSG AQUACEL AG ADV 3.5X 6 (GAUZE/BANDAGES/DRESSINGS) ×2 IMPLANT
DURAPREP 26ML APPLICATOR (WOUND CARE) ×2 IMPLANT
ELECT REM PT RETURN 15FT ADLT (MISCELLANEOUS) ×2 IMPLANT
ELIMINATOR HOLE APEX DEPUY (Hips) IMPLANT
GAUZE XEROFORM 1X8 LF (GAUZE/BANDAGES/DRESSINGS) IMPLANT
GLOVE BIOGEL PI IND STRL 8 (GLOVE) ×4 IMPLANT
GLOVE ECLIPSE 7.5 STRL STRAW (GLOVE) ×4 IMPLANT
GOWN STRL REUS W/ TWL XL LVL3 (GOWN DISPOSABLE) ×4 IMPLANT
GRIPTON 100 52 (Orthopedic Implant) ×1 IMPLANT
HEAD M SROM 36MM 2 (Hips) IMPLANT
HOLDER FOLEY CATH W/STRAP (MISCELLANEOUS) ×2 IMPLANT
HOOD PEEL AWAY T7 (MISCELLANEOUS) ×6 IMPLANT
KIT TURNOVER KIT A (KITS) IMPLANT
LINER ACETAB NEUTRAL 36ID 520D (Liner) IMPLANT
NDL HYPO 22X1.5 SAFETY MO (MISCELLANEOUS) ×4 IMPLANT
NEEDLE HYPO 22X1.5 SAFETY MO (MISCELLANEOUS) ×2 IMPLANT
PACK ANTERIOR HIP CUSTOM (KITS) ×2 IMPLANT
SPIKE FLUID TRANSFER (MISCELLANEOUS) ×2 IMPLANT
SROM M HEAD 36MM 2 (Hips) ×1 IMPLANT
STAPLER SKIN PROX WIDE 3.9 (STAPLE) IMPLANT
STEM FEMORAL SZ 5MM STD ACTIS (Stem) IMPLANT
STRIP CLOSURE SKIN 1/2X4 (GAUZE/BANDAGES/DRESSINGS) IMPLANT
SUT ETHIBOND NAB CT1 #1 30IN (SUTURE) ×4 IMPLANT
SUT MNCRL AB 3-0 PS2 18 (SUTURE) IMPLANT
SUT VIC AB 0 CT1 36 (SUTURE) ×2 IMPLANT
SUT VIC AB 1 CT1 36 (SUTURE) ×2 IMPLANT
SUT VIC AB 2-0 CT1 TAPERPNT 27 (SUTURE) ×2 IMPLANT
SUT VICRYL+ 3-0 36IN CT-1 (SUTURE) IMPLANT
TRAY CATH INTERMITTENT SS 16FR (CATHETERS) IMPLANT
TRAY FOLEY MTR SLVR 16FR STAT (SET/KITS/TRAYS/PACK) IMPLANT
TUBE SUCTION HIGH CAP CLEAR NV (SUCTIONS) ×2 IMPLANT

## 2023-07-03 NOTE — Anesthesia Preprocedure Evaluation (Addendum)
 Anesthesia Evaluation  Patient identified by MRN, date of birth, ID band Patient awake    Reviewed: Allergy & Precautions, NPO status , Patient's Chart, lab work & pertinent test results  History of Anesthesia Complications Negative for: history of anesthetic complications  Airway Mallampati: III  TM Distance: >3 FB Neck ROM: Limited    Dental  (+) Dental Advisory Given   Pulmonary sleep apnea and Continuous Positive Airway Pressure Ventilation , COPD,  COPD inhaler, former smoker   Pulmonary exam normal        Cardiovascular negative cardio ROS Normal cardiovascular exam     Neuro/Psych  PSYCHIATRIC DISORDERS Anxiety     negative neurological ROS     GI/Hepatic Neg liver ROS,GERD  Controlled,,  Endo/Other  Hypothyroidism    Renal/GU CRFRenal disease     Musculoskeletal  (+) Arthritis ,  Fibromyalgia -  Abdominal   Peds  Hematology negative hematology ROS (+)   Anesthesia Other Findings HSV  Reproductive/Obstetrics                             Anesthesia Physical Anesthesia Plan  ASA: 3  Anesthesia Plan: Spinal   Post-op Pain Management: Tylenol PO (pre-op)*   Induction:   PONV Risk Score and Plan: 2 and Treatment may vary due to age or medical condition and Propofol infusion  Airway Management Planned: Natural Airway and Simple Face Mask  Additional Equipment: None  Intra-op Plan:   Post-operative Plan:   Informed Consent: I have reviewed the patients History and Physical, chart, labs and discussed the procedure including the risks, benefits and alternatives for the proposed anesthesia with the patient or authorized representative who has indicated his/her understanding and acceptance.       Plan Discussed with: CRNA and Anesthesiologist  Anesthesia Plan Comments: (Labs reviewed, platelets acceptable. Discussed risks and benefits of spinal, including spinal/epidural  hematoma, infection, failed block, and PDPH. Patient expressed understanding and wished to proceed. )       Anesthesia Quick Evaluation

## 2023-07-03 NOTE — Transfer of Care (Signed)
 Immediate Anesthesia Transfer of Care Note  Patient: [name] [name]  Procedure(s) Performed: TOTAL HIP ARTHROPLASTY ANTERIOR APPROACH (Right: Hip)  Patient Location: PACU  Anesthesia Type:MAC and Spinal  Level of Consciousness: awake, alert , oriented, and patient cooperative  Airway & Oxygen Therapy: Patient Spontanous Breathing and Patient connected to face mask oxygen  Post-op Assessment: Report given to RN and Post -op Vital signs reviewed and stable  Post vital signs: Reviewed and stable  Last Vitals:  Vitals Value Taken Time  BP    Temp    Pulse    Resp    SpO2      Last Pain:  Vitals:   07/03/23 1101  TempSrc:   PainSc: 8       Patients Stated Pain Goal: 5 (07/03/23 1101)  Complications: No notable events documented.

## 2023-07-03 NOTE — Anesthesia Procedure Notes (Signed)
 Spinal  Patient location during procedure: OR Start time: 07/03/2023 12:08 PM End time: 07/03/2023 12:11 PM Reason for block: surgical anesthesia Staffing Performed: anesthesiologist  Anesthesiologist: Beryle Lathe, MD Performed by: Beryle Lathe, MD Authorized by: Beryle Lathe, MD   Preanesthetic Checklist Completed: patient identified, IV checked, risks and benefits discussed, surgical consent, monitors and equipment checked, pre-op evaluation and timeout performed Spinal Block Patient position: sitting Prep: DuraPrep Patient monitoring: heart rate, cardiac monitor, continuous pulse ox and blood pressure Approach: midline Location: L2-3 Injection technique: single-shot Needle Needle type: Pencan  Needle gauge: 24 G Additional Notes Consent was obtained prior to the procedure with all questions answered and concerns addressed. Risks including, but not limited to, bleeding, infection, nerve damage, paralysis, failed block, inadequate analgesia, allergic reaction, high spinal, itching, and headache were discussed and the patient wished to proceed. Functioning IV was confirmed and monitors were applied. Sterile prep and drape, including hand hygiene, mask, and sterile gloves were used. The patient was positioned and the spine was prepped. The skin was anesthetized with lidocaine. Free flow of clear CSF was obtained prior to injecting local anesthetic into the CSF. The spinal needle aspirated freely following injection. The needle was carefully withdrawn. The patient tolerated the procedure well.   Leslye Peer, MD

## 2023-07-03 NOTE — Anesthesia Postprocedure Evaluation (Signed)
 Anesthesia Post Note  Patient: Malu Pellegrini Henrichs  Procedure(s) Performed: TOTAL HIP ARTHROPLASTY ANTERIOR APPROACH (Right: Hip)     Patient location during evaluation: PACU Anesthesia Type: Spinal Level of consciousness: oriented and awake and alert Pain management: pain level controlled Vital Signs Assessment: post-procedure vital signs reviewed and stable Respiratory status: spontaneous breathing, respiratory function stable and nonlabored ventilation Cardiovascular status: blood pressure returned to baseline and stable Postop Assessment: no headache, no backache, no apparent nausea or vomiting, spinal receding and patient able to bend at knees Anesthetic complications: no   No notable events documented.  Last Vitals:  Vitals:   07/03/23 1445 07/03/23 1500  BP: (!) 153/73 (!) 150/73  Pulse: 60 63  Resp: 12 12  Temp:  (!) 36.3 C  SpO2: 99% 97%    Last Pain:  Vitals:   07/03/23 1502  TempSrc:   PainSc: 5                  Bari Leib A.

## 2023-07-03 NOTE — Progress Notes (Signed)
   07/03/23 2218  BiPAP/CPAP/SIPAP  $ Non-Invasive Home Ventilator  Initial  BiPAP/CPAP/SIPAP Pt Type Adult  BiPAP/CPAP/SIPAP Resmed  Mask Type Nasal pillows (FROM HOME)  Dentures removed? Not applicable  Respiratory Rate 16 breaths/min  FiO2 (%) 21 %  Patient Home Equipment No (ONLY HER NASAL PILLOWS AND SHORT TUBING FROM HOME)  Auto Titrate Yes (VAUTO, MIN4CM,MAX20CM H2O)  CPAP/SIPAP surface wiped down Yes  BiPAP/CPAP /SiPAP Vitals  Pulse Rate 86  Resp 16  SpO2 93 %

## 2023-07-03 NOTE — Plan of Care (Signed)
   Problem: Education: Goal: Knowledge of General Education information will improve Description Including pain rating scale, medication(s)/side effects and non-pharmacologic comfort measures Outcome: Progressing   Problem: Health Behavior/Discharge Planning: Goal: Ability to manage health-related needs will improve Outcome: Progressing

## 2023-07-03 NOTE — Op Note (Signed)
 PATIENT ID:      [name] [name]  MRN:     [id number] DOB/AGE:    1940/11/22 / 83 y.o.       OPERATIVE REPORT    DATE OF PROCEDURE:  07/03/2023       PREOPERATIVE DIAGNOSIS:  RIGHT HIP OSTEOARTHRITIS                                                       Estimated body mass index is 25.61 kg/m as calculated from the following:   Height as of this encounter: 5\' 2"  (1.575 m).   Weight as of this encounter: 63.5 kg.     POSTOPERATIVE DIAGNOSIS:  RIGHT HIP OSTEOARTHRITIS                                                           PROCEDURE:  1. right total hip arthroplasty using a 52 mm DePuy gription Cup, [employer],  neutral liner, a -1 36 mm ceramic head,  and a #5  Actis stem, 2.interpretation of multiple intraoperative fluoroscopic images   SURGEON: Harvie Junior    ASSISTANT:   Gus Puma PA-C  (present throughout entire procedure and necessary for timely completion of the procedure)  ANESTHESIA: spinal  BLOOD LOSS: 300cc Tranexamic Acid: 1 gram IV DRAINS: None COMPLICATIONS: None    NDICATIONS FOR PROCEDURE:Patient with end-stage arthritis of the right hip.  X-rays show bone-on-bone arthritic changes. Despite conservative measures with observation, anti-inflammatory medicine, narcotics, use of a cane, has severe unremitting pain and can ambulate only less than 1 block before resting.  Patient desires elective right total hip arthroplasty to decrease pain and increase function. The risks, benefits, and alternatives were discussed at length including but not limited to the risks of infection, bleeding, nerve injury, stiffness, blood clots, the need for revision surgery, cardiopulmonary complications, among others, and they were willing to proceed.Benefits have been discussed. Questions answered.     PROCEDURE IN DETAIL: The patient was identified by armband,  received preoperative IV antibiotics in the holding area at [facility], taken to the operating  room , appropriate anesthetic monitors  were attached and spinal anesthesia was induced.  The patient was placed onto the hot bed and all bony prominences were well-padded.The right hip was prepped and draped for an anterior approach to the hip.  An incision was made and the subcutaneous dissection was down to the level of the tensor fascia.  The fascia was opened and finger dissected.  The bleeders coming across the anterior portion of the hip were identified and cauterized. Retractors were put in place above and below the femoral neck.  The capsule was opened and tagged and a provisional neck cut was made.  The head was removed and sized on the back table.  The acetabulum was sequentially reamed to a level of 51 mm and a 52 mm gription coated pinnacle cup was hammered into place with 45 of lateral opening and 30 of anteversion.fluoroscopy was used to ensure this position of the cup.  Attention was turned towards the femur where the leg was actually rotated, extended,  and adduction did.  The femur was sequentially broached until a size of 5 broach gave a perfect fit and fill.at this point a  1-1.0 mm metal hip ball was placed and the hip reduced.  Fluoroscopic images were taken to assess the leg length, fit and fill of the stem, and cup position.  We were happy with the construct at this point.  The 5 broach was removed and a final Actis stem with standard offset  and a -1 mm metal hip ball was placed and reduced.  Final images were taken to make certain there were happy with the position at this point.   The capsule was closed with #1 Vicryl suture.  The tensor fascia was closed with 0 Vicryl suture.  The skin was then closed with combination of 0 and 2-0 Vicryl suture.  The top layer was with 3-0 Monocryl suture.  Benzoin and Steri-Strips were applied  and a sterile compressive dressing was applied and the patient taken to recovery room she noted be in satisfactory condition.  Past medical Motion for the  procedure was approximately 300 cc.  Of note Gus Puma was present for the entire case and assisted by retraction of tissues, manipulation of the leg, and closing the minimize or time.    Harvie Junior 07/03/2023, 1:42 PM

## 2023-07-03 NOTE — Interval H&P Note (Signed)
 History and Physical Interval Note:  07/03/2023 11:05 AM  [name] [name]  has presented today for surgery, with the diagnosis of RIGHT HIP OSTEOARTHRITIS.  The various methods of treatment have been discussed with the patient and family. After consideration of risks, benefits and other options for treatment, the patient has consented to  Procedure(s): TOTAL HIP ARTHROPLASTY ANTERIOR APPROACH (Right) as a surgical intervention.  The patient's history has been reviewed, patient examined, no change in status, stable for surgery.  I have reviewed the patient's chart and labs.  Questions were answered to the patient's satisfaction.     Harvie Junior

## 2023-07-03 NOTE — Discharge Instructions (Signed)

## 2023-07-04 ENCOUNTER — Encounter (HOSPITAL_COMMUNITY): Payer: Self-pay | Admitting: Orthopedic Surgery

## 2023-07-04 DIAGNOSIS — J449 Chronic obstructive pulmonary disease, unspecified: Secondary | ICD-10-CM | POA: Diagnosis not present

## 2023-07-04 DIAGNOSIS — M1611 Unilateral primary osteoarthritis, right hip: Secondary | ICD-10-CM | POA: Diagnosis not present

## 2023-07-04 DIAGNOSIS — E039 Hypothyroidism, unspecified: Secondary | ICD-10-CM | POA: Diagnosis not present

## 2023-07-04 DIAGNOSIS — N183 Chronic kidney disease, stage 3 unspecified: Secondary | ICD-10-CM | POA: Diagnosis not present

## 2023-07-04 DIAGNOSIS — Z87891 Personal history of nicotine dependence: Secondary | ICD-10-CM | POA: Diagnosis not present

## 2023-07-04 DIAGNOSIS — Z79899 Other long term (current) drug therapy: Secondary | ICD-10-CM | POA: Diagnosis not present

## 2023-07-04 NOTE — Progress Notes (Signed)
 Subjective: 1 Day Post-Op Procedure(s) (LRB): TOTAL HIP ARTHROPLASTY ANTERIOR APPROACH (Right) Patient reports pain as mild.  Foley removed this morning.  The patient has not voided yet.  Patient has not been out of bed since surgery.  Objective: Vital signs in last 24 hours: Temp:  [97.3 F (36.3 C)-98.8 F (37.1 C)] 97.8 F (36.6 C) (02/25 0527) Pulse Rate:  [60-87] 82 (02/25 0527) Resp:  [12-19] 17 (02/25 0527) BP: (124-153)/(61-78) 124/66 (02/25 0527) SpO2:  [93 %-100 %] 96 % (02/25 0527) FiO2 (%):  [21 %] 21 % (02/24 2218) Weight:  [63.5 kg] 63.5 kg (02/24 1101)  Intake/Output from previous day: 02/24 0701 - 02/25 0700 In: 1526.6 [P.O.:360; I.V.:966.6; IV Piggyback:200] Out: 1650 [Urine:1500; Blood:150] Intake/Output this shift: No intake/output data recorded.  No results for input(s): "HGB" in the last 72 hours. No results for input(s): "WBC", "RBC", "HCT", "PLT" in the last 72 hours. No results for input(s): "NA", "K", "CL", "CO2", "BUN", "CREATININE", "GLUCOSE", "CALCIUM" in the last 72 hours. No results for input(s): "LABPT", "INR" in the last 72 hours. Right hip exam: Neurovascular intact Sensation intact distally Intact pulses distally Dorsiflexion/Plantar flexion intact No cellulitis present Compartment soft   Assessment/Plan: 1 Day Post-Op Procedure(s) (LRB): TOTAL HIP ARTHROPLASTY ANTERIOR APPROACH (Right) Plan: Ambulate weightbearing as tolerated on the right.  No hip precautions. Aspirin 81 mg twice daily x 1 month postop for DVT prophylaxis. Can be discharged home after physical therapy. She needs to be able to void before she leaves and not have a fever. Follow-up Dr. Luiz Blare in 10 to 14 days.   Matthew Folks 07/04/2023, 8:07 AM

## 2023-07-04 NOTE — Plan of Care (Signed)
 Problem: Education: Goal: Knowledge of General Education information will improve Description: Including pain rating scale, medication(s)/side effects and non-pharmacologic comfort measures Outcome: Adequate for Discharge   Problem: Health Behavior/Discharge Planning: Goal: Ability to manage health-related needs will improve Outcome: Adequate for Discharge   Problem: Clinical Measurements: Goal: Ability to maintain clinical measurements within normal limits will improve Outcome: Adequate for Discharge Goal: Will remain free from infection Outcome: Adequate for Discharge Goal: Diagnostic test results will improve Outcome: Adequate for Discharge Goal: Respiratory complications will improve Outcome: Adequate for Discharge Goal: Cardiovascular complication will be avoided Outcome: Adequate for Discharge   Problem: Activity: Goal: Risk for activity intolerance will decrease Outcome: Adequate for Discharge   Problem: Nutrition: Goal: Adequate nutrition will be maintained Outcome: Adequate for Discharge   Problem: Coping: Goal: Level of anxiety will decrease Outcome: Adequate for Discharge   Problem: Elimination: Goal: Will not experience complications related to bowel motility Outcome: Adequate for Discharge Goal: Will not experience complications related to urinary retention Outcome: Adequate for Discharge   Problem: Pain Managment: Goal: General experience of comfort will improve and/or be controlled Outcome: Adequate for Discharge   Problem: Safety: Goal: Ability to remain free from injury will improve Outcome: Adequate for Discharge   Problem: Skin Integrity: Goal: Risk for impaired skin integrity will decrease Outcome: Adequate for Discharge   Problem: Education: Goal: Knowledge of the prescribed therapeutic regimen will improve Outcome: Adequate for Discharge Goal: Understanding of discharge needs will improve Outcome: Adequate for Discharge Goal:  Individualized Educational Video(s) Outcome: Adequate for Discharge   Problem: Activity: Goal: Ability to avoid complications of mobility impairment will improve Outcome: Adequate for Discharge Goal: Ability to tolerate increased activity will improve Outcome: Adequate for Discharge   Problem: Clinical Measurements: Goal: Postoperative complications will be avoided or minimized Outcome: Adequate for Discharge   Problem: Pain Management: Goal: Pain level will decrease with appropriate interventions Outcome: Adequate for Discharge   Problem: Skin Integrity: Goal: Will show signs of wound healing Outcome: Adequate for Discharge   Problem: Education: Goal: Knowledge of General Education information will improve Description: Including pain rating scale, medication(s)/side effects and non-pharmacologic comfort measures Outcome: Adequate for Discharge   Problem: Health Behavior/Discharge Planning: Goal: Ability to manage health-related needs will improve Outcome: Adequate for Discharge   Problem: Clinical Measurements: Goal: Ability to maintain clinical measurements within normal limits will improve Outcome: Adequate for Discharge Goal: Will remain free from infection Outcome: Adequate for Discharge Goal: Diagnostic test results will improve Outcome: Adequate for Discharge Goal: Respiratory complications will improve Outcome: Adequate for Discharge Goal: Cardiovascular complication will be avoided Outcome: Adequate for Discharge   Problem: Activity: Goal: Risk for activity intolerance will decrease Outcome: Adequate for Discharge   Problem: Nutrition: Goal: Adequate nutrition will be maintained Outcome: Adequate for Discharge   Problem: Coping: Goal: Level of anxiety will decrease Outcome: Adequate for Discharge   Problem: Elimination: Goal: Will not experience complications related to bowel motility Outcome: Adequate for Discharge Goal: Will not experience  complications related to urinary retention Outcome: Adequate for Discharge   Problem: Pain Managment: Goal: General experience of comfort will improve and/or be controlled Outcome: Adequate for Discharge   Problem: Safety: Goal: Ability to remain free from injury will improve Outcome: Adequate for Discharge   Problem: Skin Integrity: Goal: Risk for impaired skin integrity will decrease Outcome: Adequate for Discharge   Problem: Education: Goal: Knowledge of the prescribed therapeutic regimen will improve Outcome: Adequate for Discharge Goal: Understanding of discharge needs will improve  Outcome: Adequate for Discharge Goal: Individualized Educational Video(s) Outcome: Adequate for Discharge   Problem: Activity: Goal: Ability to avoid complications of mobility impairment will improve Outcome: Adequate for Discharge Goal: Ability to tolerate increased activity will improve Outcome: Adequate for Discharge   Problem: Clinical Measurements: Goal: Postoperative complications will be avoided or minimized Outcome: Adequate for Discharge   Problem: Pain Management: Goal: Pain level will decrease with appropriate interventions Outcome: Adequate for Discharge   Problem: Skin Integrity: Goal: Will show signs of wound healing Outcome: Adequate for Discharge

## 2023-07-04 NOTE — Progress Notes (Signed)
 Physical Therapy Treatment Patient Details Name: [name] [name] MRN: [id number] DOB: 1941/01/05 Today's Date: 07/04/2023   History of Present Illness 83 yo female presents to therapy s/p  R THA, anterior approach on 07/03/2023 due to failure of conservative measures. Pt PMH includes but is not limited HY:QMVHQION fusion syndrome, diverticulosis, CKD, III, hypothyroid, restrictive lung dz, CTS of B wrist s/p surgery, emphysema, ASCVD, pulmonary nodules, fibromyalgia, OSA on CPAP, and COPD.    PT Comments   [name] [name] is a 83 y.o. female  POD 0 s/p R THA. Patient reports mod I with mobility at baseline. Patient is now limited by functional impairments (see PT problem list below) S and cues for transfers. Patient was able to ambulate 150 feet with RW and S level of assist. Patient provided HO for LE  exercise to facilitate ROM and circulation to manage edema and no further questions or concerns with HEP completed in am session.  Step navigation 8 inch step x 2 with RW and cues for safety and technique with CGA.  Patient will benefit from continued skilled PT interventions to address impairments and progress towards PLOF. Pt has demonstrated safe mobility and is ready for discharge home with family support and HH services from PT standpoint.    If plan is discharge home, recommend the following: A little help with walking and/or transfers;A little help with bathing/dressing/bathroom;Assistance with cooking/housework;Assist for transportation;Help with stairs or ramp for entrance   Can travel by private vehicle        Equipment Recommendations  None recommended by PT    Recommendations for Other Services       Precautions / Restrictions Precautions Precautions: Fall Restrictions Weight Bearing Restrictions Per Provider Order: Yes RLE Weight Bearing Per Provider Order: Weight bearing as tolerated     Mobility  Bed Mobility Overal bed mobility: Needs Assistance Bed Mobility:  Supine to Sit     Supine to sit: Supervision     General bed mobility comments: pt seated in recliner when PT arrived    Transfers Overall transfer level: Needs assistance Equipment used: Rolling walker (2 wheels) Transfers: Sit to/from Stand Sit to Stand: Supervision           General transfer comment: min cues for proper UE and AD placement    Ambulation/Gait Ambulation/Gait assistance: Contact guard assist, Supervision Gait Distance (Feet): 150 Feet Assistive device: Rolling walker (2 wheels) Gait Pattern/deviations: Step-to pattern, Antalgic, Trunk flexed Gait velocity: decreased     General Gait Details: slight trunk flexion with B UE support at RW to offload R LE in stance phase, min cues for safety with RW management in personal room and in hallway   Stairs Stairs: Yes Stairs assistance: Contact guard assist Stair Management: With walker Number of Stairs: 1 General stair comments: step naviagtion with use of RW 8 inch step x 2 with cues for safety and sequencing   Wheelchair Mobility     Tilt Bed    Modified Rankin (Stroke Patients Only)       Balance Overall balance assessment: Needs assistance, History of Falls Sitting-balance support: Feet supported Sitting balance-Leahy Scale: Good     Standing balance support: Bilateral upper extremity supported, During functional activity, Reliant on assistive device for balance Standing balance-Leahy Scale: Fair Standing balance comment: static standing no UE support                            Communication Communication Communication:  No apparent difficulties  Cognition Arousal: Alert Behavior During Therapy: WFL for tasks assessed/performed   PT - Cognitive impairments: No apparent impairments                         Following commands: Intact      Cueing    Exercises Total Joint Exercises Ankle Circles/Pumps: AROM, Both, 10 reps Quad Sets: AROM, Right, 5 reps Heel  Slides: AROM, Right, 5 reps Hip ABduction/ADduction: AROM, Right, 5 reps, Standing Long Arc Quad: AROM, Right, 5 reps, Seated Knee Flexion: AROM, Right, 5 reps, Standing Standing Hip Extension: AROM, Right, 5 reps, Standing    General Comments        Pertinent Vitals/Pain Pain Assessment Pain Assessment: 0-10 Pain Score: 4  Pain Location: R hip and LE Pain Descriptors / Indicators: Aching, Constant, Discomfort, Dull, Grimacing, Operative site guarding Pain Intervention(s): Limited activity within patient's tolerance, Monitored during session, Premedicated before session, Repositioned, Ice applied    Home Living Family/patient expects to be discharged to:: Private residence Living Arrangements: Alone Available Help at Discharge: Family (daughter will assist at time of eval) Type of Home: House Home Access: Stairs to enter Entrance Stairs-Rails: None Entrance Stairs-Number of Steps: 1   Home Layout: One level Home Equipment: Educational psychologist (2 wheels);Cane - single point      Prior Function            PT Goals (current goals can now be found in the care plan section) Acute Rehab PT Goals Patient Stated Goal: be able to do chores around the house, cook for the holidays, move without pain PT Goal Formulation: With patient Time For Goal Achievement: 07/17/23 Potential to Achieve Goals: Good Progress towards PT goals: Progressing toward goals    Frequency    7X/week      PT Plan      Co-evaluation              AM-PAC PT "6 Clicks" Mobility   Outcome Measure  Help needed turning from your back to your side while in a flat bed without using bedrails?: None Help needed moving from lying on your back to sitting on the side of a flat bed without using bedrails?: A Little Help needed moving to and from a bed to a chair (including a wheelchair)?: A Little Help needed standing up from a chair using your arms (e.g., wheelchair or bedside chair)?: A  Little Help needed to walk in hospital room?: A Little Help needed climbing 3-5 steps with a railing? : A Little 6 Click Score: 19    End of Session Equipment Utilized During Treatment: Gait belt Activity Tolerance: Patient tolerated treatment well;No increased pain Patient left: in chair;with call bell/phone within reach Nurse Communication: Mobility status;Other (comment) (pt readiness for d/c from PT standpoint) PT Visit Diagnosis: Unsteadiness on feet (R26.81);Other abnormalities of gait and mobility (R26.89);Muscle weakness (generalized) (M62.81);History of falling (Z91.81);Difficulty in walking, not elsewhere classified (R26.2);Pain Pain - Right/Left: Right Pain - part of body: Hip;Leg     Time: 8786-7672 PT Time Calculation (min) (ACUTE ONLY): 16 min  Charges:    $Gait Training: 8-22 mins $Therapeutic Exercise: 8-22 mins PT General Charges $$ ACUTE PT VISIT: 1 Visit                     Johnny Bridge, PT Acute Rehab    Jacqualyn Posey 07/04/2023, 2:30 PM

## 2023-07-04 NOTE — Evaluation (Signed)
 Physical Therapy Evaluation Patient Details Name: [name] [name] MRN: [id number] DOB: 26-Aug-1940 Today's Date: 07/04/2023  History of Present Illness  83 yo female presents to therapy s/p  R THA, anterior approach on 07/03/2023 due to failure of conservative measures. Pt PMH includes but is not limited QV:ZDGLOVFI fusion syndrome, diverticulosis, CKD, III, hypothyroid, restrictive lung dz, CTS of B wrist s/p surgery, emphysema, ASCVD, pulmonary nodules, fibromyalgia, OSA on CPAP, and COPD.  Clinical Impression    [name] [name] is a 83 y.o. female  POD 0 s/p R THA. Patient reports mod I with mobility at baseline. Patient is now limited by functional impairments (see PT problem list below) and requires S for bed mobility and CGA and cues for transfers. Patient was able to ambulate 50 feet with RW and CGA level of assist. Patient instructed in exercise to facilitate ROM and circulation to manage edema. Patient will benefit from continued skilled PT interventions to address impairments and progress towards PLOF. Acute PT will follow to progress mobility and stair training in preparation for safe discharge home with family support and [facility] services.       If plan is discharge home, recommend the following: A little help with walking and/or transfers;A little help with bathing/dressing/bathroom;Assistance with cooking/housework;Assist for transportation;Help with stairs or ramp for entrance   Can travel by private vehicle        Equipment Recommendations None recommended by PT  Recommendations for Other Services       Functional Status Assessment Patient has had a recent decline in their functional status and demonstrates the ability to make significant improvements in function in a reasonable and predictable amount of time.     Precautions / Restrictions Precautions Precautions: Fall Restrictions Weight Bearing Restrictions Per Provider Order: Yes RLE Weight Bearing Per Provider  Order: Weight bearing as tolerated      Mobility  Bed Mobility Overal bed mobility: Needs Assistance Bed Mobility: Supine to Sit     Supine to sit: Supervision     General bed mobility comments: min cues and use of hospital bed    Transfers Overall transfer level: Needs assistance Equipment used: Rolling walker (2 wheels) Transfers: Sit to/from Stand Sit to Stand: Contact guard assist           General transfer comment: min cues for proper UE and AD placement with bed, recliner and commode transfers    Ambulation/Gait Ambulation/Gait assistance: Contact guard assist Gait Distance (Feet): 50 Feet Assistive device: Rolling walker (2 wheels) Gait Pattern/deviations: Step-to pattern, Antalgic, Trunk flexed Gait velocity: decreased     General Gait Details: slight trunk flexion with B UE support at RW to offload R LE in stance phase, min cues for safety with RW management in personal room and in hallway  Stairs            Wheelchair Mobility     Tilt Bed    Modified Rankin (Stroke Patients Only)       Balance Overall balance assessment: Needs assistance, History of Falls Sitting-balance support: Feet supported Sitting balance-Leahy Scale: Good     Standing balance support: Bilateral upper extremity supported, During functional activity, Reliant on assistive device for balance Standing balance-Leahy Scale: Fair Standing balance comment: static standing no UE support                             Pertinent Vitals/Pain Pain Assessment Pain Assessment: 0-10 Pain Score: 3  Pain  Location: R hip and LE Pain Descriptors / Indicators: Aching, Constant, Discomfort, Dull, Grimacing, Operative site guarding Pain Intervention(s): Limited activity within patient's tolerance, Monitored during session, Premedicated before session, Repositioned, Ice applied    Home Living Family/patient expects to be discharged to:: Private residence Living  Arrangements: Alone Available Help at Discharge: Family (daughter will assist at time of eval) Type of Home: House Home Access: Stairs to enter Entrance Stairs-Rails: None Entrance Stairs-Number of Steps: 1   Home Layout: One level Home Equipment: Educational psychologist (2 wheels);Cane - single point      Prior Function Prior Level of Function : Independent/Modified Independent;Driving             Mobility Comments: occational use of SPC, mod I for all ADLs, self care tasks and IADLs       Extremity/Trunk Assessment        Lower Extremity Assessment Lower Extremity Assessment: RLE deficits/detail RLE Deficits / Details: ankle DF/PF 5/5 RLE Sensation: WNL    Cervical / Trunk Assessment Cervical / Trunk Assessment: Neck Surgery  Communication   Communication Communication: No apparent difficulties    Cognition Arousal: Alert Behavior During Therapy: WFL for tasks assessed/performed   PT - Cognitive impairments: No apparent impairments                         Following commands: Intact       Cueing       General Comments      Exercises Total Joint Exercises Ankle Circles/Pumps: AROM, Both, 10 reps Quad Sets: AROM, Right, 5 reps Heel Slides: AROM, Right, 5 reps Hip ABduction/ADduction: AROM, Right, 5 reps, Standing Long Arc Quad: AROM, Right, 5 reps, Seated Knee Flexion: AROM, Right, 5 reps, Standing Standing Hip Extension: AROM, Right, 5 reps, Standing   Assessment/Plan    PT Assessment Patient needs continued PT services  PT Problem List Decreased strength;Decreased range of motion;Decreased activity tolerance;Decreased balance;Decreased mobility;Decreased coordination;Pain       PT Treatment Interventions DME instruction;Gait training;Stair training;Functional mobility training;Therapeutic activities;Therapeutic exercise;Balance training;Neuromuscular re-education;Patient/family education;Modalities    PT Goals (Current goals can  be found in the Care Plan section)  Acute Rehab PT Goals Patient Stated Goal: be able to do chores around the house, cook for the holidays, move without pain PT Goal Formulation: With patient Time For Goal Achievement: 07/17/23 Potential to Achieve Goals: Good    Frequency 7X/week     Co-evaluation               AM-PAC PT "6 Clicks" Mobility  Outcome Measure Help needed turning from your back to your side while in a flat bed without using bedrails?: None Help needed moving from lying on your back to sitting on the side of a flat bed without using bedrails?: A Little Help needed moving to and from a bed to a chair (including a wheelchair)?: A Little Help needed standing up from a chair using your arms (e.g., wheelchair or bedside chair)?: A Little Help needed to walk in hospital room?: A Little Help needed climbing 3-5 steps with a railing? : A Lot 6 Click Score: 18    End of Session Equipment Utilized During Treatment: Gait belt Activity Tolerance: Patient tolerated treatment well;No increased pain Patient left: in chair;with call bell/phone within reach Nurse Communication: Mobility status PT Visit Diagnosis: Unsteadiness on feet (R26.81);Other abnormalities of gait and mobility (R26.89);Muscle weakness (generalized) (M62.81);History of falling (Z91.81);Difficulty in walking, not elsewhere classified (R26.2);Pain  Pain - Right/Left: Right Pain - part of body: Hip;Leg    Time: 1000-1040 PT Time Calculation (min) (ACUTE ONLY): 40 min   Charges:   PT Evaluation $PT Eval Low Complexity: 1 Low PT Treatments $Gait Training: 8-22 mins $Therapeutic Exercise: 8-22 mins PT General Charges $$ ACUTE PT VISIT: 1 Visit         Johnny Bridge, PT Acute Rehab   Jacqualyn Posey 07/04/2023, 2:23 PM

## 2023-07-04 NOTE — TOC Transition Note (Signed)
 Transition of Care [facility]) - Discharge Note   Patient Details  Name: [name] [name] MRN: [id number] Date of Birth: 02-20-1941  [facility] of [facility]) CM/SW Contact:  Howell Rucks, RN Phone Number: 07/04/2023, 10:41 AM   Clinical Narrative:  Met with pt at bedside to review dc therapy and home DME needs, pt confirmed HH PT with [facility], pt reports she has a RW at home, [employer] DME rep education pt on adjusting RW. No TOC needs.       Final next level of care: Home w Home Health Services Barriers to Discharge: No Barriers Identified   Patient Goals and CMS Choice Patient states their goals for this hospitalization and ongoing recovery are:: return home          Discharge Placement                       Discharge Plan and Services Additional resources added to the After Visit Summary for                  DME Arranged: Walker rolling DME Agency: [employer]       HH Arranged: PT HH Agency: [facility]        Social Drivers of Health (SDOH) Interventions SDOH Screenings   Food Insecurity: No Food Insecurity (07/03/2023)  Housing: Low Risk  (07/03/2023)  Transportation Needs: No Transportation Needs (07/03/2023)  Utilities: Not At Risk (07/03/2023)  Alcohol Screen: Low Risk  (05/22/2023)  Depression (PHQ2-9): Low Risk  (05/26/2023)  Financial Resource Strain: Low Risk  (05/22/2023)  Physical Activity: Unknown (05/22/2023)  Social Connections: Unknown (07/03/2023)  Stress: No Stress Concern Present (05/22/2023)  Tobacco Use: Medium Risk (07/03/2023)     Readmission Risk Interventions     No data to display

## 2023-07-04 NOTE — Care Management Obs Status (Signed)
 MEDICARE OBSERVATION STATUS NOTIFICATION   Patient Details  Name: [name] [name] MRN: [id number] Date of Birth: 23-Aug-1940   Medicare Observation Status Notification Given:  Yes    Howell Rucks, RN 07/04/2023, 9:46 AM

## 2023-07-04 NOTE — Progress Notes (Signed)
Reviewed written d/c instructions w pt and all questions answered. She verbalized understanding. D/C via w/c w all belongings in stable condition. 

## 2023-07-04 NOTE — Discharge Summary (Signed)
 Patient ID: [name] [name] MRN: [id number] DOB/AGE: Jan 04, 1941 83 y.o.  Admit date: 07/03/2023 Discharge date: 07/04/2023  Admission Diagnoses:  Principal Problem:   Primary osteoarthritis of right hip   Discharge Diagnoses:  Same  Past Medical History:  Diagnosis Date   Anxiety    intermittent   Arthritis    blood in stool    CKD (chronic kidney disease), stage III (HCC)    COPD (chronic obstructive pulmonary disease) (HCC)    Diverticulosis    Emphysema lung (HCC)    Fibromyalgia    GERD (gastroesophageal reflux disease)    Glaucoma    Hemorrhoids    HSV-2 (herpes simplex virus 2) infection 2009   Hypothyroidism    OSA on CPAP 07/27/2006   Osteoporosis    Rectal bleeding    with hemorrhoids    Surgeries: Procedure(s): Right TOTAL HIP ARTHROPLASTY ANTERIOR APPROACH on 07/03/2023   Consultants:   Discharged Condition: Improved  Hospital Course: [name] [name] is an 83 y.o. female who was admitted 07/03/2023 for operative treatment ofPrimary osteoarthritis of right hip. Patient has severe unremitting pain that affects sleep, daily activities, and work/hobbies. After pre-op clearance the patient was taken to the operating room on 07/03/2023 and underwent  Procedure(s): Right TOTAL HIP ARTHROPLASTY ANTERIOR APPROACH.    Patient was given perioperative antibiotics:  Anti-infectives (From admission, onward)    Start     Dose/Rate Route Frequency Ordered Stop   07/03/23 1800  ceFAZolin (ANCEF) IVPB 2g/100 mL premix        2 g 200 mL/hr over 30 Minutes Intravenous Every 6 hours 07/03/23 1543 07/04/23 0114   07/03/23 1030  ceFAZolin (ANCEF) IVPB 2g/100 mL premix        2 g 200 mL/hr over 30 Minutes Intravenous On call to O.R. 07/03/23 1017 07/03/23 1224        Patient was given sequential compression devices, early ambulation, and chemoprophylaxis to prevent DVT.  Patient benefited maximally from hospital stay and there were no complications.    Recent  vital signs: Patient Vitals for the past 24 hrs:  BP Temp Temp src Pulse Resp SpO2 Height Weight  07/04/23 0527 124/66 97.8 F (36.6 C) Oral 82 17 96 % -- --  07/04/23 0129 136/70 97.8 F (36.6 C) Oral 86 17 95 % -- --  07/03/23 2218 -- -- -- 86 16 93 % -- --  07/03/23 2151 137/67 98.8 F (37.1 C) Oral 87 16 94 % -- --  07/03/23 1547 (!) 140/72 98 F (36.7 C) Oral 73 16 94 % -- --  07/03/23 1500 (!) 150/73 (!) 97.4 F (36.3 C) -- 63 12 97 % -- --  07/03/23 1445 (!) 153/73 -- -- 60 12 99 % -- --  07/03/23 1430 128/69 -- -- 64 12 99 % -- --  07/03/23 1415 132/63 -- -- 67 19 100 % -- --  07/03/23 1411 (!) 140/61 (!) 97.3 F (36.3 C) -- 70 16 100 % -- --  07/03/23 1101 -- -- -- -- -- -- 5\' 2"  (1.575 m) 63.5 kg  07/03/23 1029 (!) 153/78 98.3 F (36.8 C) Oral 87 15 96 % -- --     Recent laboratory studies: No results for input(s): "WBC", "HGB", "HCT", "PLT", "NA", "K", "CL", "CO2", "BUN", "CREATININE", "GLUCOSE", "INR", "CALCIUM" in the last 72 hours.  Invalid input(s): "PT", "2"   Discharge Medications:   Allergies as of 07/04/2023       Reactions   Alphagan P [brimonidine  Tartrate]    rash   Netarsudil-latanoprost Other (See Comments)   Causes eye irritation, eyelid swelling    Codeine Rash   Penicillins Rash   Sulfa Antibiotics Rash        Medication List     TAKE these medications    acetaminophen 650 MG CR tablet Commonly known as: TYLENOL Take 1 tablet (650 mg total) by mouth every 8 (eight) hours as needed for pain.   albuterol 108 (90 Base) MCG/ACT inhaler Commonly known as: VENTOLIN HFA INHALE 2 PUFFS INTO THE LUNGS EVERY 4 HOURS AS NEEDED FOR WHEEZING OR SHORTNESS OF BREATH.   aspirin EC 81 MG tablet Take 1 tablet (81 mg total) by mouth 2 (two) times daily with a meal. Take x 1 month post op to decrease risk of blood clots.   atorvastatin 10 MG tablet Commonly known as: LIPITOR TAKE 1 TABLET BY MOUTH EVERY DAY   bifidobacterium infantis capsule Take  1 capsule by mouth daily.   CALCIUM/D3 ADULT GUMMIES PO Take 2 each by mouth daily. 2 gummies daily with meal   celecoxib 100 MG capsule Commonly known as: CeleBREX Take 1 capsule (100 mg total) by mouth daily.   COLLAGEN PO Take 1 Scoop by mouth daily.   docusate sodium 100 MG capsule Commonly known as: Colace Take 1 capsule (100 mg total) by mouth 2 (two) times daily.   DULoxetine 60 MG capsule Commonly known as: CYMBALTA TAKE 1 CAPSULE BY MOUTH EVERY DAY   HYDROcodone-acetaminophen 5-325 MG tablet Commonly known as: NORCO/VICODIN Take 1 tablet by mouth every 6 (six) hours as needed for moderate pain (pain score 4-6).   levothyroxine 75 MCG tablet Commonly known as: SYNTHROID TAKE 1 TABLET BY MOUTH EVERY DAY BEFORE BREAKFAST   loratadine 10 MG tablet Commonly known as: CLARITIN Take 10 mg by mouth daily.   MULTIVITAMINS PO Take 1 tablet by mouth daily.   polyethylene glycol 17 g packet Commonly known as: MIRALAX / GLYCOLAX Take 17 g by mouth daily as needed for moderate constipation.   Stiolto Respimat 2.5-2.5 MCG/ACT Aers Generic drug: Tiotropium Bromide-Olodaterol Inhale 2 puffs into the lungs daily.   Timolol Maleate PF 0.5 % Soln Place 1 drop into the left eye in the morning and at bedtime.   tiZANidine 2 MG tablet Commonly known as: ZANAFLEX Take 1 tablet (2 mg total) by mouth every 8 (eight) hours as needed for muscle spasms.   vitamin B-12 100 MCG tablet Commonly known as: CYANOCOBALAMIN Take 100 mcg by mouth daily.   VITAMIN D PO Take 2,000 mg by mouth daily.   Vitamin K2 100 MCG Tabs Take 1 tablet by mouth daily.               Durable Medical Equipment  (From admission, onward)           Start     Ordered   07/03/23 1544  DME Walker rolling  Once       Question:  Patient needs a walker to treat with the following condition  Answer:  Primary osteoarthritis of right hip   07/03/23 1543   07/03/23 1544  DME 3 n 1  Once         07/03/23 1543              Discharge Care Instructions  (From admission, onward)           Start     Ordered   07/04/23 0000  Weight bearing as  tolerated       Question Answer Comment  Laterality right   Extremity Lower      07/04/23 0809            Diagnostic Studies: DG HIP UNILAT WITH PELVIS 1V RIGHT Result Date: 07/03/2023 CLINICAL DATA:  Right hip surgery EXAM: DG HIP (WITH OR WITHOUT PELVIS) 1V RIGHT COMPARISON:  02/21/2023 FINDINGS: Interval placement of a right total hip prosthesis, without visible periprosthetic fracture or complicating feature on fluoroscopic spot image. IMPRESSION: 1. Right total hip prosthesis without complicating feature on fluoroscopic spot image. Electronically Signed   By: Gaylyn Rong M.D.   On: 07/03/2023 17:26   DG C-Arm 1-60 Min-No Report Result Date: 07/03/2023 Fluoroscopy was utilized by the requesting physician.  No radiographic interpretation.   DG C-Arm 1-60 Min-No Report Result Date: 07/03/2023 Fluoroscopy was utilized by the requesting physician.  No radiographic interpretation.   DG Chest 2 View Result Date: 06/28/2023 CLINICAL DATA:  Preop chest exam. Upcoming right hip surgery. History of COPD. EXAM: CHEST - 2 VIEW COMPARISON:  Radiograph 02/17/2022.  CT 01/25/2017 FINDINGS: The cardiomediastinal contours are normal. Coarse lung markings. Biapical pleuroparenchymal scarring. Pulmonary vasculature is normal. No consolidation, pleural effusion, or pneumothorax. No acute osseous abnormalities are seen. Cervical spine hardware partially included. IMPRESSION: 1. No active cardiopulmonary disease. 2. Coarse lung markings. Electronically Signed   By: Narda Rutherford M.D.   On: 06/28/2023 12:57    Disposition: Discharge disposition: 01-Home or Self Care       Discharge Instructions     Call MD / Call 911   Complete by: As directed    If you experience chest pain or shortness of breath, CALL 911 and be transported to  the hospital emergency room.  If you develope a fever above 101 F, pus (white drainage) or increased drainage or redness at the wound, or calf pain, call your surgeon's office.   Constipation Prevention   Complete by: As directed    Drink plenty of fluids.  Prune juice may be helpful.  You may use a stool softener, such as Colace (over the counter) 100 mg twice a day.  Use MiraLax (over the counter) for constipation as needed.   Diet general   Complete by: As directed    Increase activity slowly as tolerated   Complete by: As directed    Post-operative opioid taper instructions:   Complete by: As directed    POST-OPERATIVE OPIOID TAPER INSTRUCTIONS: It is important to wean off of your opioid medication as soon as possible. If you do not need pain medication after your surgery it is ok to stop day one. Opioids include: Codeine, Hydrocodone(Norco, Vicodin), Oxycodone(Percocet, oxycontin) and hydromorphone amongst others.  Long term and even short term use of opiods can cause: Increased pain response Dependence Constipation Depression Respiratory depression And more.  Withdrawal symptoms can include Flu like symptoms Nausea, vomiting And more Techniques to manage these symptoms Hydrate well Eat regular healthy meals Stay active Use relaxation techniques(deep breathing, meditating, yoga) Do Not substitute Alcohol to help with tapering If you have been on opioids for less than two weeks and do not have pain than it is ok to stop all together.  Plan to wean off of opioids This plan should start within one week post op of your joint replacement. Maintain the same interval or time between taking each dose and first decrease the dose.  Cut the total daily intake of opioids by one tablet each day Next start  to increase the time between doses. The last dose that should be eliminated is the evening dose.      Weight bearing as tolerated   Complete by: As directed    Laterality: right    Extremity: Lower        Follow-up Information     Jodi Geralds, MD. Go on 07/18/2023.   Specialty: Orthopedic Surgery Why: your appointment is scheduled for 10:45 Contact information: [address] [place] [address] [phone]         [facility] Follow up.   Why: HHPT will provide 6 home visits prior to starting outpatient physical therapy        [facility] [place]. Go on 07/19/2023.   Why: Your outpatient physical therapy has been scheduled. they will call you with a time Contact information: [phone]                 Signed: Matthew Folks 07/04/2023, 8:10 AM

## 2023-07-06 DIAGNOSIS — F419 Anxiety disorder, unspecified: Secondary | ICD-10-CM | POA: Diagnosis not present

## 2023-07-06 DIAGNOSIS — I2510 Atherosclerotic heart disease of native coronary artery without angina pectoris: Secondary | ICD-10-CM | POA: Diagnosis not present

## 2023-07-06 DIAGNOSIS — F32A Depression, unspecified: Secondary | ICD-10-CM | POA: Diagnosis not present

## 2023-07-06 DIAGNOSIS — Z471 Aftercare following joint replacement surgery: Secondary | ICD-10-CM | POA: Diagnosis not present

## 2023-07-06 DIAGNOSIS — J4489 Other specified chronic obstructive pulmonary disease: Secondary | ICD-10-CM | POA: Diagnosis not present

## 2023-07-06 DIAGNOSIS — K5790 Diverticulosis of intestine, part unspecified, without perforation or abscess without bleeding: Secondary | ICD-10-CM | POA: Diagnosis not present

## 2023-07-06 DIAGNOSIS — E785 Hyperlipidemia, unspecified: Secondary | ICD-10-CM | POA: Diagnosis not present

## 2023-07-06 DIAGNOSIS — Z791 Long term (current) use of non-steroidal anti-inflammatories (NSAID): Secondary | ICD-10-CM | POA: Diagnosis not present

## 2023-07-06 DIAGNOSIS — N183 Chronic kidney disease, stage 3 unspecified: Secondary | ICD-10-CM | POA: Diagnosis not present

## 2023-07-06 DIAGNOSIS — J439 Emphysema, unspecified: Secondary | ICD-10-CM | POA: Diagnosis not present

## 2023-07-12 DIAGNOSIS — N183 Chronic kidney disease, stage 3 unspecified: Secondary | ICD-10-CM | POA: Diagnosis not present

## 2023-07-12 DIAGNOSIS — Z471 Aftercare following joint replacement surgery: Secondary | ICD-10-CM | POA: Diagnosis not present

## 2023-07-12 DIAGNOSIS — K5790 Diverticulosis of intestine, part unspecified, without perforation or abscess without bleeding: Secondary | ICD-10-CM | POA: Diagnosis not present

## 2023-07-12 DIAGNOSIS — E785 Hyperlipidemia, unspecified: Secondary | ICD-10-CM | POA: Diagnosis not present

## 2023-07-12 DIAGNOSIS — F32A Depression, unspecified: Secondary | ICD-10-CM | POA: Diagnosis not present

## 2023-07-12 DIAGNOSIS — F419 Anxiety disorder, unspecified: Secondary | ICD-10-CM | POA: Diagnosis not present

## 2023-07-12 DIAGNOSIS — J4489 Other specified chronic obstructive pulmonary disease: Secondary | ICD-10-CM | POA: Diagnosis not present

## 2023-07-12 DIAGNOSIS — J439 Emphysema, unspecified: Secondary | ICD-10-CM | POA: Diagnosis not present

## 2023-07-12 DIAGNOSIS — I2510 Atherosclerotic heart disease of native coronary artery without angina pectoris: Secondary | ICD-10-CM | POA: Diagnosis not present

## 2023-07-13 DIAGNOSIS — J4489 Other specified chronic obstructive pulmonary disease: Secondary | ICD-10-CM | POA: Diagnosis not present

## 2023-07-13 DIAGNOSIS — K5790 Diverticulosis of intestine, part unspecified, without perforation or abscess without bleeding: Secondary | ICD-10-CM | POA: Diagnosis not present

## 2023-07-13 DIAGNOSIS — F419 Anxiety disorder, unspecified: Secondary | ICD-10-CM | POA: Diagnosis not present

## 2023-07-13 DIAGNOSIS — J439 Emphysema, unspecified: Secondary | ICD-10-CM | POA: Diagnosis not present

## 2023-07-13 DIAGNOSIS — F32A Depression, unspecified: Secondary | ICD-10-CM | POA: Diagnosis not present

## 2023-07-13 DIAGNOSIS — N183 Chronic kidney disease, stage 3 unspecified: Secondary | ICD-10-CM | POA: Diagnosis not present

## 2023-07-13 DIAGNOSIS — E785 Hyperlipidemia, unspecified: Secondary | ICD-10-CM | POA: Diagnosis not present

## 2023-07-13 DIAGNOSIS — Z471 Aftercare following joint replacement surgery: Secondary | ICD-10-CM | POA: Diagnosis not present

## 2023-07-13 DIAGNOSIS — I2510 Atherosclerotic heart disease of native coronary artery without angina pectoris: Secondary | ICD-10-CM | POA: Diagnosis not present

## 2023-07-14 DIAGNOSIS — Z471 Aftercare following joint replacement surgery: Secondary | ICD-10-CM | POA: Diagnosis not present

## 2023-07-14 DIAGNOSIS — J4489 Other specified chronic obstructive pulmonary disease: Secondary | ICD-10-CM | POA: Diagnosis not present

## 2023-07-14 DIAGNOSIS — E785 Hyperlipidemia, unspecified: Secondary | ICD-10-CM | POA: Diagnosis not present

## 2023-07-14 DIAGNOSIS — J439 Emphysema, unspecified: Secondary | ICD-10-CM | POA: Diagnosis not present

## 2023-07-14 DIAGNOSIS — K5790 Diverticulosis of intestine, part unspecified, without perforation or abscess without bleeding: Secondary | ICD-10-CM | POA: Diagnosis not present

## 2023-07-14 DIAGNOSIS — F419 Anxiety disorder, unspecified: Secondary | ICD-10-CM | POA: Diagnosis not present

## 2023-07-14 DIAGNOSIS — N183 Chronic kidney disease, stage 3 unspecified: Secondary | ICD-10-CM | POA: Diagnosis not present

## 2023-07-14 DIAGNOSIS — F32A Depression, unspecified: Secondary | ICD-10-CM | POA: Diagnosis not present

## 2023-07-14 DIAGNOSIS — I2510 Atherosclerotic heart disease of native coronary artery without angina pectoris: Secondary | ICD-10-CM | POA: Diagnosis not present

## 2023-07-17 DIAGNOSIS — F32A Depression, unspecified: Secondary | ICD-10-CM | POA: Diagnosis not present

## 2023-07-17 DIAGNOSIS — J439 Emphysema, unspecified: Secondary | ICD-10-CM | POA: Diagnosis not present

## 2023-07-17 DIAGNOSIS — J4489 Other specified chronic obstructive pulmonary disease: Secondary | ICD-10-CM | POA: Diagnosis not present

## 2023-07-17 DIAGNOSIS — N183 Chronic kidney disease, stage 3 unspecified: Secondary | ICD-10-CM | POA: Diagnosis not present

## 2023-07-17 DIAGNOSIS — F419 Anxiety disorder, unspecified: Secondary | ICD-10-CM | POA: Diagnosis not present

## 2023-07-17 DIAGNOSIS — E785 Hyperlipidemia, unspecified: Secondary | ICD-10-CM | POA: Diagnosis not present

## 2023-07-17 DIAGNOSIS — Z471 Aftercare following joint replacement surgery: Secondary | ICD-10-CM | POA: Diagnosis not present

## 2023-07-17 DIAGNOSIS — K5790 Diverticulosis of intestine, part unspecified, without perforation or abscess without bleeding: Secondary | ICD-10-CM | POA: Diagnosis not present

## 2023-07-17 DIAGNOSIS — I2510 Atherosclerotic heart disease of native coronary artery without angina pectoris: Secondary | ICD-10-CM | POA: Diagnosis not present

## 2023-07-18 DIAGNOSIS — M1611 Unilateral primary osteoarthritis, right hip: Secondary | ICD-10-CM | POA: Diagnosis not present

## 2023-07-18 NOTE — Therapy (Unsigned)
 OUTPATIENT PHYSICAL THERAPY LOWER EXTREMITY EVALUATION   Patient Name: [name] [name] MRN: [id number] DOB:[birth year], 83 y.o., female Today's Date: 07/19/2023  END OF SESSION:  PT End of Session - 07/19/23 1625     Visit Number 1    Number of Visits 16    Date for PT Re-Evaluation 09/13/23    Authorization Type [employer] MCR    Authorization Time Period auth required    Progress Note Due on Visit 10    PT Start Time 1400    PT Stop Time 1445    PT Time Calculation (min) 45 min    Activity Tolerance Patient tolerated treatment well;No increased pain             Past Medical History:  Diagnosis Date   Anxiety    intermittent   Arthritis    blood in stool    CKD (chronic kidney disease), stage III (HCC)    COPD (chronic obstructive pulmonary disease) (HCC)    Diverticulosis    Emphysema lung (HCC)    Fibromyalgia    GERD (gastroesophageal reflux disease)    Glaucoma    Hemorrhoids    HSV-2 (herpes simplex virus 2) infection 2009   Hypothyroidism    OSA on CPAP 07/27/2006   Osteoporosis    Rectal bleeding    with hemorrhoids   Past Surgical History:  Procedure Laterality Date   BUNIONECTOMY  1478,2956   CARPAL TUNNEL RELEASE Right 12/08/2016   Procedure: CARPAL TUNNEL RELEASE;  Surgeon: Cindee Salt, MD;  Location: [facility];  Service: Orthopedics;  Laterality: Right;  REG/FAB   CARPAL TUNNEL RELEASE Left 07/02/2021   Procedure: CARPAL TUNNEL RELEASE LEFT;  Surgeon: Cindee Salt, MD;  Location: [facility];  Service: Orthopedics;  Laterality: Left;  45 MIN   CERVICAL FUSION  2008   EYE SURGERY     cataract and glaucoma   hemorrhoids removed     MENISCUS REPAIR Left    TOTAL HIP ARTHROPLASTY Right 07/03/2023   Procedure: TOTAL HIP ARTHROPLASTY ANTERIOR APPROACH;  Surgeon: Jodi Geralds, MD;  Location: [facility];  Service: Orthopedics;  Laterality: Right;   TRIGGER FINGER RELEASE  2008   Patient Active Problem List   Diagnosis Date  Noted   Pre-operative clearance 05/26/2023   Primary osteoarthritis of left knee 02/11/2020   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   Right arm pain 03/07/2019   Diverticulosis 03/06/2019   CKD (chronic kidney disease), stage III (HCC) 09/06/2018   Allergic rhinitis 08/13/2018   Hypothyroid 02/05/2018   Osteopenia with high risk of fracture 04/26/2017   Bilateral tinnitus 04/25/2017   Restrictive lung disease 04/07/2017   Centrilobular emphysema (HCC) 01/19/2017   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   Trigger ring finger of right hand 08/12/2016   Genital herpes 02/16/2016   Primary osteoarthritis of right hip 07/27/2015   ASCVD (arteriosclerotic cardiovascular disease) 02/05/2015   Rectal bleeding 02/05/2015   Pulmonary nodules 02/05/2015   Glaucoma 01/06/2015   Fibromyalgia 01/06/2015   Elevated cholesterol with elevated triglycerides 01/06/2015   Obstructive sleep apnea on CPAP 01/06/2015   Osteoporosis 01/06/2015    PCP: Dr Nani Gasser  REFERRING PROVIDER: Dr Jodi Geralds  REFERRING DIAG: R hip pain s/p R THA  THERAPY DIAG:  Pain in right hip  Stiffness of right hip, not elsewhere classified  Muscle weakness (generalized)  Other symptoms and signs involving the musculoskeletal system  Rationale for Evaluation and Treatment: Rehabilitation  ONSET DATE: 07/03/23 surgery  date   SUBJECTIVE:   SUBJECTIVE STATEMENT: Patient reports R hip pain for over a year prior to surgery with no known injury. Surgery went well with no complications and patient reports that she is doing well since surgery. She is taking care of herself at home.   PERTINENT HISTORY: Hip pathology for > 5 years; COPD; emphysema; cervical disc surgery; arthritis knees and hands  PAIN:  Are you having pain? Yes: NPRS scale: 1/10 Pain location: R hip  Pain description: tenderness; soreness; aching  Aggravating factors: walking; moving around more  Relieving factors: rest    PRECAUTIONS: Anterior hip - no restrictions per pt per MD  RED FLAGS: None   WEIGHT BEARING RESTRICTIONS: No  FALLS:  Has patient fallen in last 6 months? No  LIVING ENVIRONMENT: Lives with: lives with their family and lives alone Lives in: House/apartment Stairs: No Has following equipment at home: Single point cane; Environmental consultant - 2 wheeled   OCCUPATION: retired; [employer]; household chores; silver sneakers; church work   PLOF: Independent  PATIENT GOALS: walk without assistive device; return to silver sneakers and home exercise   NEXT MD VISIT: 08/17/23  OBJECTIVE:  Note: Objective measures were completed at Evaluation unless otherwise noted.  DIAGNOSTIC FINDINGS: 03/02/23 XR IMPRESSION: 1. Severe osteoarthritis of the right hip, substantially progressive from 07/22/2015. 2. Moderate osteoarthritis of the left hip. 3. L4-5 spondylosis.  PATIENT SURVEYS:  LEFS 24/80; 30%  COGNITION: Overall cognitive status: Within functional limits for tasks assessed     SENSATION: WFL  EDEMA:  Minimal   MUSCLE LENGTH: Hamstrings: Right 55 deg; Left 70 deg Thomas test: Right 0 deg; Left 10 deg  POSTURE: rounded shoulders, forward head, flexed trunk , and weight shift left  PALPATION: Tender anterior hip Scar anterior hip   LOWER EXTREMITY ROM:  Active ROM Right eval Left eval  Hip flexion 90   Hip extension 0   Hip abduction 20   Hip adduction    Hip internal rotation    Hip external rotation    Knee flexion 120   Knee extension 0   Ankle dorsiflexion    Ankle plantarflexion    Ankle inversion    Ankle eversion     (Blank rows = not tested)  LOWER EXTREMITY MMT: R not tested resistively - moving well against gravity   L LE - WNL's    MMT Right eval Left eval  Hip flexion    Hip extension    Hip abduction    Hip adduction    Hip internal rotation    Hip external rotation    Knee flexion    Knee extension    Ankle dorsiflexion    Ankle  plantarflexion    Ankle inversion    Ankle eversion     (Blank rows = not tested)   FUNCTIONAL TESTS:  5 times sit to stand: 13.63 sec use of UEs on table   GAIT: Distance walked: 40 Assistive device utilized: Walker - 2 wheeled Level of assistance: Complete Independence Comments: antalgic gait limp R LE  TREATMENT DATE:    Supine Quad Set  - 1 x daily - 7 x weekly - 2 sets - 10 reps - 2-3 seconds hold - Sit to Stand with Armchair  - 2 x daily - 7 x weekly - 1 sets - 10 reps - Standing Hip Extension with Counter Support  - 2 x daily - 7 x weekly - 1-2 sets - 10 reps - 3-5 sec  hold - Standing Hip Abduction with Counter Support  - 1 x daily - 7 x weekly - 2-3 sets - 10 reps - 2-3 sec  hold - Standing Knee Flexion Strengthening at Chair  - 2 x daily - 7 x weekly - 1 sets - 10 reps - 2-3 sec  hold - Standing Hip Flexion  - 2 x daily - 7 x weekly - 1 sets - 10 reps - 2-3 sec  hold  Gait:  -Gait with rolling walker VC for wt shift and even stride length; improved posture    PATIENT EDUCATION:  Education details: POC; HEP  Person educated: Patient Education method: Programmer, multimedia, Demonstration, Tactile cues, Verbal cues, and Handouts Education comprehension: verbalized understanding, returned demonstration, verbal cues required, tactile cues required, and needs further education  HOME EXERCISE PROGRAM: Access Code: [email or web address] URL: [email or web address] Date: 07/19/2023 Prepared by: Corlis Leak  Exercises - Supine Bridge  - 1 x daily - 7 x weekly - 3 sets - 10 reps - Supine Heel Slide with Strap (Mirrored)  - 2 x daily - 7 x weekly - 2 sets - 10 reps - Supine Hip Adduction Isometric with Ball  - 1 x daily - 7 x weekly - 2 sets - 10 reps - Hooklying Isometric Hip External Rotation  - 1 x daily - 7 x weekly - 1-2 sets - 10 reps - 10 sec hold -  Supine Quad Set  - 1 x daily - 7 x weekly - 2 sets - 10 reps - 2-3 seconds hold - Sit to Stand with Armchair  - 2 x daily - 7 x weekly - 1 sets - 10 reps - Standing Hip Extension with Counter Support  - 2 x daily - 7 x weekly - 1-2 sets - 10 reps - 3-5 sec  hold - Standing Hip Abduction with Counter Support  - 1 x daily - 7 x weekly - 2-3 sets - 10 reps - 2-3 sec  hold - Standing Knee Flexion Strengthening at Chair  - 2 x daily - 7 x weekly - 1 sets - 10 reps - 2-3 sec  hold - Standing Hip Flexion  - 2 x daily - 7 x weekly - 1 sets - 10 reps - 2-3 sec  hold  ASSESSMENT:  CLINICAL IMPRESSION: Patient is a 83 y.o. female who was seen today for physical therapy evaluation and treatment s/p R THA 07/03/23. She has a history of chronic right hip pain due to OA. She has persistent intermittent pain; difficulty with walking, bed mobility and sit to stand transfers as well as a history of falls prior to surgery -  three falls in the past six months. She presents today s/p anterior approach R THA 07/03/23 with excellent pain resolution; limited hip ROM, mobility, strength; dependent, abnormal gait; limited transfers and transitional movements; limited ADL's. Patient will benefit from PT to address deficits identified.  OBJECTIVE IMPAIRMENTS: Abnormal gait, decreased activity tolerance, decreased balance, decreased endurance, decreased mobility, difficulty walking, decreased ROM, decreased strength, impaired flexibility, improper body mechanics, postural dysfunction, and  pain.   ACTIVITY LIMITATIONS: carrying, lifting, bending, standing, squatting, sleeping, stairs, transfers, bed mobility, and locomotion level  PARTICIPATION LIMITATIONS: meal prep, cleaning, laundry, driving, shopping, and community activity  PERSONAL FACTORS: Past/current experiences, Time since onset of injury/illness/exacerbation, and 1-2 comorbidities: COPD; emphysema; sedentary activity level  are also affecting patient's functional  outcome.   REHAB POTENTIAL: Good  CLINICAL DECISION MAKING: Evolving/moderate complexity  EVALUATION COMPLEXITY: Moderate   GOALS: Goals reviewed with patient? Yes  SHORT TERM GOALS: Target date: 08/16/2023   Independent in initial HEP  Baseline: Goal status: INITIAL  2.  Independent in basic transfers sit <-> stand; bed mobility Baseline:  Goal status: INITIAL  3.  Ambulation with single point cane with safe, functional gait for in the home Baseline:  Goal status: INITIAL   LONG TERM GOALS: Target date: 09/13/2023   Independent in all transfers and transitional activities  Baseline:  Goal status: INITIAL  2.  4+/5 to 5/5 strength R LE  Baseline:  Goal status: INITIAL  3.  Independent gait without assistive device for community distances  Baseline:  Goal status: INITIAL  4.  Return to community based exercise such as silver sneakers 2 days/week  Baseline:  Goal status: INITIAL  5.  Independent in HEP  Baseline:  Goal status: INITIAL  6.  Improve LEFS by 20% or greater  Baseline: LEFS 24/80; 30% Goal status: INITIAL   PLAN:  PT FREQUENCY: 2x/week  PT DURATION: 8 weeks  PLANNED INTERVENTIONS: 97110-Therapeutic exercises, 97530- Therapeutic activity, 97112- Neuromuscular re-education, 97535- Self Care, 28413- Manual therapy, [phone]- Gait training, [phone]- Aquatic Therapy, [phone]- Ionotophoresis 4mg /ml Dexamethasone, Patient/Family education, Balance training, Stair training, Taping, Dry Needling, Cryotherapy, and Moist heat  PLAN FOR NEXT SESSION: review and progress exercises; continue with education; transfer training; gait training including stairs; manual work and modalities as indicated    [employer], PT 07/19/2023, 4:28 PM   Referring diagnosis? M25.551 Treatment diagnosis? (if different than referring diagnosis) M25.551; M 25.651; M 62.81; R 29.898 What was this (referring dx) caused by? [x]  Surgery []  Fall [x]  Ongoing issue []  Arthritis []   Other: ____________  Laterality: [x]  Rt []  Lt []  Both  Check all possible CPT codes:  *CHOOSE 10 OR LESS*    See Planned Interventions listed in the Plan section of the Evaluation.

## 2023-07-19 ENCOUNTER — Encounter: Payer: Self-pay | Admitting: Rehabilitative and Restorative Service Providers"

## 2023-07-19 ENCOUNTER — Ambulatory Visit: Payer: Medicare PPO | Attending: Sports Medicine | Admitting: Rehabilitative and Restorative Service Providers"

## 2023-07-19 ENCOUNTER — Other Ambulatory Visit: Payer: Self-pay

## 2023-07-19 DIAGNOSIS — R29898 Other symptoms and signs involving the musculoskeletal system: Secondary | ICD-10-CM | POA: Insufficient documentation

## 2023-07-19 DIAGNOSIS — M25551 Pain in right hip: Secondary | ICD-10-CM | POA: Insufficient documentation

## 2023-07-19 DIAGNOSIS — R252 Cramp and spasm: Secondary | ICD-10-CM | POA: Insufficient documentation

## 2023-07-19 DIAGNOSIS — M6281 Muscle weakness (generalized): Secondary | ICD-10-CM | POA: Insufficient documentation

## 2023-07-19 DIAGNOSIS — M25651 Stiffness of right hip, not elsewhere classified: Secondary | ICD-10-CM | POA: Insufficient documentation

## 2023-07-26 ENCOUNTER — Ambulatory Visit

## 2023-07-26 DIAGNOSIS — M25551 Pain in right hip: Secondary | ICD-10-CM | POA: Diagnosis not present

## 2023-07-26 DIAGNOSIS — R29898 Other symptoms and signs involving the musculoskeletal system: Secondary | ICD-10-CM

## 2023-07-26 DIAGNOSIS — R252 Cramp and spasm: Secondary | ICD-10-CM

## 2023-07-26 DIAGNOSIS — M6281 Muscle weakness (generalized): Secondary | ICD-10-CM | POA: Diagnosis not present

## 2023-07-26 DIAGNOSIS — M25651 Stiffness of right hip, not elsewhere classified: Secondary | ICD-10-CM | POA: Diagnosis not present

## 2023-07-26 NOTE — Therapy (Signed)
 OUTPATIENT PHYSICAL THERAPY LOWER EXTREMITY TREATMENT   Patient Name: EVE REY MRN: [id number] DOB:[birth year], 83 y.o., female Today's Date: 07/26/2023  END OF SESSION:  PT End of Session - 07/26/23 1454     Visit Number 2    Number of Visits 16    Date for PT Re-Evaluation 09/13/23    Authorization Type HUMANA MCR    PT Start Time 1453    PT Stop Time 1531    PT Time Calculation (min) 38 min    Activity Tolerance Patient tolerated treatment well    Behavior During Therapy WFL for tasks assessed/performed             Past Medical History:  Diagnosis Date   Anxiety    intermittent   Arthritis    blood in stool    CKD (chronic kidney disease), stage III (HCC)    COPD (chronic obstructive pulmonary disease) (HCC)    Diverticulosis    Emphysema lung (HCC)    Fibromyalgia    GERD (gastroesophageal reflux disease)    Glaucoma    Hemorrhoids    HSV-2 (herpes simplex virus 2) infection 2009   Hypothyroidism    OSA on CPAP 07/27/2006   Osteoporosis    Rectal bleeding    with hemorrhoids   Past Surgical History:  Procedure Laterality Date   BUNIONECTOMY  1093,[id number]   CARPAL TUNNEL RELEASE Right 12/08/2016   Procedure: CARPAL TUNNEL RELEASE;  Surgeon: Cindee Salt, MD;  Location: [facility];  Service: Orthopedics;  Laterality: Right;  REG/FAB   CARPAL TUNNEL RELEASE Left 07/02/2021   Procedure: CARPAL TUNNEL RELEASE LEFT;  Surgeon: Cindee Salt, MD;  Location: [facility];  Service: Orthopedics;  Laterality: Left;  45 MIN   CERVICAL FUSION  2008   EYE SURGERY     cataract and glaucoma   hemorrhoids removed     MENISCUS REPAIR Left    TOTAL HIP ARTHROPLASTY Right 07/03/2023   Procedure: TOTAL HIP ARTHROPLASTY ANTERIOR APPROACH;  Surgeon: Jodi Geralds, MD;  Location: [facility];  Service: Orthopedics;  Laterality: Right;   TRIGGER FINGER RELEASE  2008   Patient Active Problem List   Diagnosis Date Noted   Pre-operative clearance  05/26/2023   Primary osteoarthritis of left knee 02/11/2020   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   Right arm pain 03/07/2019   Diverticulosis 03/06/2019   CKD (chronic kidney disease), stage III (HCC) 09/06/2018   Allergic rhinitis 08/13/2018   Hypothyroid 02/05/2018   Osteopenia with high risk of fracture 04/26/2017   Bilateral tinnitus 04/25/2017   Restrictive lung disease 04/07/2017   Centrilobular emphysema (HCC) 01/19/2017   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   Trigger ring finger of right hand 08/12/2016   Genital herpes 02/16/2016   Primary osteoarthritis of right hip 07/27/2015   ASCVD (arteriosclerotic cardiovascular disease) 02/05/2015   Rectal bleeding 02/05/2015   Pulmonary nodules 02/05/2015   Glaucoma 01/06/2015   Fibromyalgia 01/06/2015   Elevated cholesterol with elevated triglycerides 01/06/2015   Obstructive sleep apnea on CPAP 01/06/2015   Osteoporosis 01/06/2015    PCP: Dr Nani Gasser  REFERRING PROVIDER: Dr Jodi Geralds  REFERRING DIAG: R hip pain s/p R THA  THERAPY DIAG:  Stiffness of right hip, not elsewhere classified  Muscle weakness (generalized)  Pain in right hip  Other symptoms and signs involving the musculoskeletal system  Cramp and spasm  Rationale for Evaluation and Treatment: Rehabilitation  ONSET DATE: 07/03/23 surgery date   SUBJECTIVE:  SUBJECTIVE STATEMENT: Patient reports she saw the doctor due to skin irritation at upper part of incision, who put her on an antibiotic. Patient states she has tenderness around lateral hip but no pain.  PERTINENT HISTORY: Hip pathology for > 5 years; COPD; emphysema; cervical disc surgery; arthritis knees and hands  PAIN:  Are you having pain? Yes: NPRS scale: 1/10 Pain location: R hip  Pain description: tenderness; soreness; aching  Aggravating factors: walking; moving around more  Relieving factors: rest   PRECAUTIONS: Anterior hip - no restrictions per pt per  MD  RED FLAGS: None   WEIGHT BEARING RESTRICTIONS: No  FALLS:  Has patient fallen in last 6 months? No  LIVING ENVIRONMENT: Lives with: lives with their family and lives alone Lives in: House/apartment Stairs: No Has following equipment at home: Single point cane; Environmental consultant - 2 wheeled   OCCUPATION: retired; [employer]; household chores; silver sneakers; church work   PLOF: Independent  PATIENT GOALS: walk without assistive device; return to silver sneakers and home exercise   NEXT MD VISIT: 08/17/23  OBJECTIVE:  Note: Objective measures were completed at Evaluation unless otherwise noted.  DIAGNOSTIC FINDINGS: 03/02/23 XR IMPRESSION: 1. Severe osteoarthritis of the right hip, substantially progressive from 07/22/2015. 2. Moderate osteoarthritis of the left hip. 3. L4-5 spondylosis.  PATIENT SURVEYS:  LEFS 24/80; 30%  COGNITION: Overall cognitive status: Within functional limits for tasks assessed     SENSATION: WFL  EDEMA:  Minimal   MUSCLE LENGTH: Hamstrings: Right 55 deg; Left 70 deg Thomas test: Right 0 deg; Left 10 deg  POSTURE: rounded shoulders, forward head, flexed trunk , and weight shift left  PALPATION: Tender anterior hip Scar anterior hip   LOWER EXTREMITY ROM:  Active ROM Right eval Left eval  Hip flexion 90   Hip extension 0   Hip abduction 20   Hip adduction    Hip internal rotation    Hip external rotation    Knee flexion 120   Knee extension 0   Ankle dorsiflexion    Ankle plantarflexion    Ankle inversion    Ankle eversion     (Blank rows = not tested)  LOWER EXTREMITY MMT: R not tested resistively - moving well against gravity   L LE - WNL's    MMT Right eval Left eval  Hip flexion    Hip extension    Hip abduction    Hip adduction    Hip internal rotation    Hip external rotation    Knee flexion    Knee extension    Ankle dorsiflexion    Ankle plantarflexion    Ankle inversion    Ankle eversion     (Blank rows  = not tested)   FUNCTIONAL TESTS:  5 times sit to stand: 13.63 sec use of UEs on table   GAIT: Distance walked: 40 Assistive device utilized: Walker - 2 wheeled Level of assistance: Complete Independence Comments: antalgic gait limp R LE     [facility] Adult PT Treatment:                                                DATE: 07/26/2023 Therapeutic Exercise: Supine heel slides AROM Hooklying hip add isometric (ball squeeze) 10x5" Bridges 2x15 Hooklying isometric hip ER press with gait belt Hip ER isometric with black TB --> bilateral  Quad  set + towel roll behind knee Standing: Hip extension 2x10 Hip abd 2x10 Knee flexion 2x10 Unilateral R hip flexion 2x10 Therapeutic Activity: Adjusting FWW and SPC to proper height Walking with SPC Stair navigation with SPC + 1 railing                                                                                                                                TREATMENT DATE:    Supine Quad Set  - 1 x daily - 7 x weekly - 2 sets - 10 reps - 2-3 seconds hold - Sit to Stand with Armchair  - 2 x daily - 7 x weekly - 1 sets - 10 reps - Standing Hip Extension with Counter Support  - 2 x daily - 7 x weekly - 1-2 sets - 10 reps - 3-5 sec  hold - Standing Hip Abduction with Counter Support  - 1 x daily - 7 x weekly - 2-3 sets - 10 reps - 2-3 sec  hold - Standing Knee Flexion Strengthening at Chair  - 2 x daily - 7 x weekly - 1 sets - 10 reps - 2-3 sec  hold - Standing Hip Flexion  - 2 x daily - 7 x weekly - 1 sets - 10 reps - 2-3 sec  hold  Gait:  -Gait with rolling walker VC for wt shift and even stride length; improved posture    PATIENT EDUCATION:  Education details: POC; HEP  Person educated: Patient Education method: Programmer, multimedia, Demonstration, Tactile cues, Verbal cues, and Handouts Education comprehension: verbalized understanding, returned demonstration, verbal cues required, tactile cues required, and needs further education  HOME  EXERCISE PROGRAM: Access Code: [email or web address] URL: [email or web address] Date: 07/19/2023 Prepared by: Corlis Leak  Exercises - Supine Bridge  - 1 x daily - 7 x weekly - 3 sets - 10 reps - Supine Heel Slide with Strap (Mirrored)  - 2 x daily - 7 x weekly - 2 sets - 10 reps - Supine Hip Adduction Isometric with Ball  - 1 x daily - 7 x weekly - 2 sets - 10 reps - Hooklying Isometric Hip External Rotation  - 1 x daily - 7 x weekly - 1-2 sets - 10 reps - 10 sec hold - Supine Quad Set  - 1 x daily - 7 x weekly - 2 sets - 10 reps - 2-3 seconds hold - Sit to Stand with Armchair  - 2 x daily - 7 x weekly - 1 sets - 10 reps - Standing Hip Extension with Counter Support  - 2 x daily - 7 x weekly - 1-2 sets - 10 reps - 3-5 sec  hold - Standing Hip Abduction with Counter Support  - 1 x daily - 7 x weekly - 2-3 sets - 10 reps - 2-3 sec  hold - Standing Knee Flexion Strengthening at Chair  - 2 x daily - 7 x weekly - 1 sets -  10 reps - 2-3 sec  hold - Standing Hip Flexion  - 2 x daily - 7 x weekly - 1 sets - 10 reps - 2-3 sec  hold  ASSESSMENT:  CLINICAL IMPRESSION: Patient demonstrated good stride length and functional hip/knee flexion. Gait training progressed with SPC; stair navigation performed with SPC and one railing. HEP reviewed in supine and standing with no exacerbation of pain.    OBJECTIVE IMPAIRMENTS: Abnormal gait, decreased activity tolerance, decreased balance, decreased endurance, decreased mobility, difficulty walking, decreased ROM, decreased strength, impaired flexibility, improper body mechanics, postural dysfunction, and pain.   ACTIVITY LIMITATIONS: carrying, lifting, bending, standing, squatting, sleeping, stairs, transfers, bed mobility, and locomotion level  PARTICIPATION LIMITATIONS: meal prep, cleaning, laundry, driving, shopping, and community activity  PERSONAL FACTORS: Past/current experiences, Time since onset of injury/illness/exacerbation, and 1-2  comorbidities: COPD; emphysema; sedentary activity level  are also affecting patient's functional outcome.   REHAB POTENTIAL: Good  CLINICAL DECISION MAKING: Evolving/moderate complexity  EVALUATION COMPLEXITY: Moderate   GOALS: Goals reviewed with patient? Yes  SHORT TERM GOALS: Target date: 08/16/2023  Independent in initial HEP  Baseline: Goal status: INITIAL  2.  Independent in basic transfers sit <-> stand; bed mobility Baseline:  Goal status: INITIAL  3.  Ambulation with single point cane with safe, functional gait for in the home Baseline:  Goal status: INITIAL   LONG TERM GOALS: Target date: 09/13/2023  Independent in all transfers and transitional activities  Baseline:  Goal status: INITIAL  2.  4+/5 to 5/5 strength R LE  Baseline:  Goal status: INITIAL  3.  Independent gait without assistive device for community distances  Baseline:  Goal status: INITIAL  4.  Return to community based exercise such as silver sneakers 2 days/week  Baseline:  Goal status: INITIAL  5.  Independent in HEP  Baseline:  Goal status: INITIAL  6.  Improve LEFS by 20% or greater  Baseline: LEFS 24/80; 30% Goal status: INITIAL   PLAN:  PT FREQUENCY: 2x/week  PT DURATION: 8 weeks  PLANNED INTERVENTIONS: 97110-Therapeutic exercises, 97530- Therapeutic activity, 97112- Neuromuscular re-education, 97535- Self Care, 09811- Manual therapy, [phone]- Gait training, [phone]- Aquatic Therapy, [phone]- Ionotophoresis 4mg /ml Dexamethasone, Patient/Family education, Balance training, Stair training, Taping, Dry Needling, Cryotherapy, and Moist heat  PLAN FOR NEXT SESSION: review and progress exercises prn; continue with education; transfer training; gait training including stairs; manual work and modalities as indicated    Sanjuana Mae, PTA 07/26/2023, 3:32 PM

## 2023-07-31 ENCOUNTER — Ambulatory Visit

## 2023-07-31 DIAGNOSIS — R29898 Other symptoms and signs involving the musculoskeletal system: Secondary | ICD-10-CM | POA: Diagnosis not present

## 2023-07-31 DIAGNOSIS — M25551 Pain in right hip: Secondary | ICD-10-CM | POA: Diagnosis not present

## 2023-07-31 DIAGNOSIS — M25651 Stiffness of right hip, not elsewhere classified: Secondary | ICD-10-CM | POA: Diagnosis not present

## 2023-07-31 DIAGNOSIS — R252 Cramp and spasm: Secondary | ICD-10-CM

## 2023-07-31 DIAGNOSIS — M6281 Muscle weakness (generalized): Secondary | ICD-10-CM

## 2023-07-31 NOTE — Therapy (Signed)
 OUTPATIENT PHYSICAL THERAPY LOWER EXTREMITY TREATMENT   Patient Name: [name] [name] MRN: [id number] DOB:[birth year], 83 y.o., female Today's Date: 07/31/2023  END OF SESSION:  PT End of Session - 07/31/23 1452     Visit Number 3    Number of Visits 16    Date for PT Re-Evaluation 09/13/23    Authorization Type HU[employer] MCR    Authorization Time Period 16 VISITS APPROVED FOR PT 07/19/2023-09/13/2023    Authorization - Visit Number 3    Authorization - Number of Visits 16    Progress Note Due on Visit 10    PT Start Time 1450    PT Stop Time 1532    PT Time Calculation (min) 42 min    Activity Tolerance Patient tolerated treatment well    Behavior During Therapy WFL for tasks assessed/performed            Past Medical History:  Diagnosis Date   Anxiety    intermittent   Arthritis    blood in stool    CKD (chronic kidney disease), stage III (HCC)    COPD (chronic obstructive pulmonary disease) (HCC)    Diverticulosis    Emphysema lung (HCC)    Fibromyalgia    GERD (gastroesophageal reflux disease)    Glaucoma    Hemorrhoids    HSV-2 (herpes simplex virus 2) infection 2009   Hypothyroidism    OSA on CPAP 07/27/2006   Osteoporosis    Rectal bleeding    with hemorrhoids   Past Surgical History:  Procedure Laterality Date   BUNIONECTOMY  4098,1191   CARPAL TUNNEL RELEASE Right 12/08/2016   Procedure: CARPAL TUNNEL RELEASE;  Surgeon: Cindee Salt, MD;  Location: [facility];  Service: Orthopedics;  Laterality: Right;  REG/FAB   CARPAL TUNNEL RELEASE Left 07/02/2021   Procedure: CARPAL TUNNEL RELEASE LEFT;  Surgeon: Cindee Salt, MD;  Location: [facility];  Service: Orthopedics;  Laterality: Left;  45 MIN   CERVICAL FUSION  2008   EYE SURGERY     cataract and glaucoma   hemorrhoids removed     MENISCUS REPAIR Left    TOTAL HIP ARTHROPLASTY Right 07/03/2023   Procedure: TOTAL HIP ARTHROPLASTY ANTERIOR APPROACH;  Surgeon: Jodi Geralds,  MD;  Location: [facility];  Service: Orthopedics;  Laterality: Right;   TRIGGER FINGER RELEASE  2008   Patient Active Problem List   Diagnosis Date Noted   Pre-operative clearance 05/26/2023   Primary osteoarthritis of left knee 02/11/2020   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   Right arm pain 03/07/2019   Diverticulosis 03/06/2019   CKD (chronic kidney disease), stage III (HCC) 09/06/2018   Allergic rhinitis 08/13/2018   Hypothyroid 02/05/2018   Osteopenia with high risk of fracture 04/26/2017   Bilateral tinnitus 04/25/2017   Restrictive lung disease 04/07/2017   Centrilobular emphysema (HCC) 01/19/2017   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   Trigger ring finger of right hand 08/12/2016   Genital herpes 02/16/2016   Primary osteoarthritis of right hip 07/27/2015   ASCVD (arteriosclerotic cardiovascular disease) 02/05/2015   Rectal bleeding 02/05/2015   Pulmonary nodules 02/05/2015   Glaucoma 01/06/2015   Fibromyalgia 01/06/2015   Elevated cholesterol with elevated triglycerides 01/06/2015   Obstructive sleep apnea on CPAP 01/06/2015   Osteoporosis 01/06/2015    PCP: Dr Nani Gasser  REFERRING PROVIDER: Dr Jodi Geralds  REFERRING DIAG: R hip pain s/p R THA  THERAPY DIAG:  Stiffness of right hip, not elsewhere classified  Muscle  weakness (generalized)  Other symptoms and signs involving the musculoskeletal system  Pain in right hip  Cramp and spasm  Rationale for Evaluation and Treatment: Rehabilitation  ONSET DATE: 07/03/23 surgery date   SUBJECTIVE:   SUBJECTIVE STATEMENT: Patient reports her skin irritation has cleared up, states she has "numbness" on lateral thigh but discomfort is less and less each day.   PERTINENT HISTORY: Hip pathology for > 5 years; COPD; emphysema; cervical disc surgery; arthritis knees and hands  PAIN:  Are you having pain? Yes: NPRS scale: 1/10 Pain location: R hip  Pain description: tenderness; soreness; aching   Aggravating factors: walking; moving around more  Relieving factors: rest   PRECAUTIONS: Anterior hip - no restrictions per pt per MD  RED FLAGS: None   WEIGHT BEARING RESTRICTIONS: No  FALLS:  Has patient fallen in last 6 months? No  LIVING ENVIRONMENT: Lives with: lives with their family and lives alone Lives in: House/apartment Stairs: No Has following equipment at home: Single point cane; Environmental consultant - 2 wheeled   OCCUPATION: retired; [employer]; household chores; silver sneakers; church work   PLOF: Independent  PATIENT GOALS: walk without assistive device; return to silver sneakers and home exercise   NEXT MD VISIT: 08/17/23  OBJECTIVE:  Note: Objective measures were completed at Evaluation unless otherwise noted.  DIAGNOSTIC FINDINGS: 03/02/23 XR IMPRESSION: 1. Severe osteoarthritis of the right hip, substantially progressive from 07/22/2015. 2. Moderate osteoarthritis of the left hip. 3. L4-5 spondylosis.  PATIENT SURVEYS:  LEFS 24/80; 30%  COGNITION: Overall cognitive status: Within functional limits for tasks assessed     SENSATION: WFL  EDEMA:  Minimal   MUSCLE LENGTH: Hamstrings: Right 55 deg; Left 70 deg Thomas test: Right 0 deg; Left 10 deg  POSTURE: rounded shoulders, forward head, flexed trunk , and weight shift left  PALPATION: Tender anterior hip Scar anterior hip   LOWER EXTREMITY ROM:  Active ROM Right eval Left eval  Hip flexion 90   Hip extension 0   Hip abduction 20   Hip adduction    Hip internal rotation    Hip external rotation    Knee flexion 120   Knee extension 0   Ankle dorsiflexion    Ankle plantarflexion    Ankle inversion    Ankle eversion     (Blank rows = not tested)  LOWER EXTREMITY MMT: R not tested resistively - moving well against gravity   L LE - WNL's    MMT Right eval Left eval  Hip flexion    Hip extension    Hip abduction    Hip adduction    Hip internal rotation    Hip external rotation     Knee flexion    Knee extension    Ankle dorsiflexion    Ankle plantarflexion    Ankle inversion    Ankle eversion     (Blank rows = not tested)   FUNCTIONAL TESTS:  5 times sit to stand: 13.63 sec use of UEs on table   GAIT: Distance walked: 40 Assistive device utilized: Walker - 2 wheeled Level of assistance: Complete Independence Comments: antalgic gait limp R LE     [facility] Adult PT Treatment:                                                DATE: 07/31/2023 Therapeutic Exercise: Self-myofascial  massage with 4" ball Hooklying hip add isometric + ball b/w knees 10x5" Hooklying hip abd + black TB 10x5" Standing at counter: Resisted side step out --> hip abd + YTB crossed at ankles Resisted side stepping + YTB crossed at ankles Hip extension 2x10 Marching 2x20 Gait: Gait training with [facility]   [facility] Adult PT Treatment:                                                DATE: 07/26/2023 Therapeutic Exercise: Supine heel slides AROM Hooklying hip add isometric (ball squeeze) 10x5" Bridges 2x15 Hooklying isometric hip ER press with gait belt Hip ER isometric with black TB --> bilateral  Quad set + towel roll behind knee Standing: Hip extension 2x10 Hip abd 2x10 Knee flexion 2x10 Unilateral R hip flexion 2x10 Therapeutic Activity: Adjusting FWW and SPC to proper height Walking with SPC Stair navigation with SPC + 1 railing                                                                                                                               TREATMENT DATE:    Supine Quad Set  - 1 x daily - 7 x weekly - 2 sets - 10 reps - 2-3 seconds hold - Sit to Stand with Armchair  - 2 x daily - 7 x weekly - 1 sets - 10 reps - Standing Hip Extension with Counter Support  - 2 x daily - 7 x weekly - 1-2 sets - 10 reps - 3-5 sec  hold - Standing Hip Abduction with Counter Support  - 1 x daily - 7 x weekly - 2-3 sets - 10 reps - 2-3 sec  hold - Standing Knee Flexion Strengthening at Chair   - 2 x daily - 7 x weekly - 1 sets - 10 reps - 2-3 sec  hold - Standing Hip Flexion  - 2 x daily - 7 x weekly - 1 sets - 10 reps - 2-3 sec  hold  Gait:  -Gait with rolling walker VC for wt shift and even stride length; improved posture    PATIENT EDUCATION:  Education details: POC; HEP  Person educated: Patient Education method: Programmer, multimedia, Demonstration, Tactile cues, Verbal cues, and Handouts Education comprehension: verbalized understanding, returned demonstration, verbal cues required, tactile cues required, and needs further education  HOME EXERCISE PROGRAM: Access Code: [email or web address] URL: [email or web address] Date: 07/31/2023 Prepared by: Carlynn Herald  Exercises - Supine Bridge  - 1 x daily - 7 x weekly - 3 sets - 10 reps - Hooklying Isometric Hip External Rotation  - 1 x daily - 7 x weekly - 1-2 sets - 10 reps - 10 sec hold - Supine Quad Set  - 1 x daily - 7 x weekly - 2 sets - 10 reps - 2-3 seconds  hold - Sit to Stand with Armchair  - 2 x daily - 7 x weekly - 1 sets - 10 reps - Standing Hip Extension with Counter Support  - 2 x daily - 7 x weekly - 1-2 sets - 10 reps - 3-5 sec  hold - Standing Hip Abduction with Counter Support  - 1 x daily - 7 x weekly - 2-3 sets - 10 reps - 2-3 sec  hold - Standing Knee Flexion Strengthening at Chair  - 2 x daily - 7 x weekly - 1 sets - 10 reps - 2-3 sec  hold - Standing Hip Flexion  - 2 x daily - 7 x weekly - 1 sets - 10 reps - 2-3 sec  hold - Seated Hip Adduction Isometrics with Ball  - 1 x daily - 7 x weekly - 3 sets - 10 reps - 5 sec hold - Seated Isometric Hip Abduction with Belt  - 1 x daily - 7 x weekly - 3 sets - 10 reps - 5 sec hold - Mini Squat with Counter Support  - 1 x daily - 7 x weekly - 3 sets - 10 reps  ASSESSMENT:  CLINICAL IMPRESSION: Gait training continued with SPC, as well as stair navigation; patient demonstrated good mechanics and coordination with SPC on stairs. Light resistance added with standing hip  strengthening exercises; patient able to complete with no exacerbation of pain. Decreased anterior weight shifting noted during sit to stand transfers; patient demonstrated compensatory lean to L side during sit to stand attempt with no upper body support.   OBJECTIVE IMPAIRMENTS: Abnormal gait, decreased activity tolerance, decreased balance, decreased endurance, decreased mobility, difficulty walking, decreased ROM, decreased strength, impaired flexibility, improper body mechanics, postural dysfunction, and pain.   ACTIVITY LIMITATIONS: carrying, lifting, bending, standing, squatting, sleeping, stairs, transfers, bed mobility, and locomotion level  PARTICIPATION LIMITATIONS: meal prep, cleaning, laundry, driving, shopping, and community activity  PERSONAL FACTORS: Past/current experiences, Time since onset of injury/illness/exacerbation, and 1-2 comorbidities: COPD; emphysema; sedentary activity level  are also affecting patient's functional outcome.   REHAB POTENTIAL: Good  CLINICAL DECISION MAKING: Evolving/moderate complexity  EVALUATION COMPLEXITY: Moderate   GOALS: Goals reviewed with patient? Yes  SHORT TERM GOALS: Target date: 08/16/2023  Independent in initial HEP  Baseline: Goal status: INITIAL  2.  Independent in basic transfers sit <-> stand; bed mobility Baseline:  Goal status: INITIAL  3.  Ambulation with single point cane with safe, functional gait for in the home Baseline:  Goal status: INITIAL   LONG TERM GOALS: Target date: 09/13/2023  Independent in all transfers and transitional activities  Baseline:  Goal status: INITIAL  2.  4+/5 to 5/5 strength R LE  Baseline:  Goal status: INITIAL  3.  Independent gait without assistive device for community distances  Baseline:  Goal status: INITIAL  4.  Return to community based exercise such as silver sneakers 2 days/week  Baseline:  Goal status: INITIAL  5.  Independent in HEP  Baseline:  Goal status:  INITIAL  6.  Improve LEFS by 20% or greater  Baseline: LEFS 24/80; 30% Goal status: INITIAL   PLAN:  PT FREQUENCY: 2x/week  PT DURATION: 8 weeks  PLANNED INTERVENTIONS: 97110-Therapeutic exercises, 97530- Therapeutic activity, [id number]- Neuromuscular re-education, 97535- Self Care, 13244- Manual therapy, [id number]- Gait training, [phone]- Aquatic Therapy, [phone]- Ionotophoresis 4mg /ml Dexamethasone, Patient/Family education, Balance training, Stair training, Taping, Dry Needling, Cryotherapy, and Moist heat  PLAN FOR NEXT SESSION: review and progress exercises prn; progress hip  strengthening and balance exercises as tolerated; manual work and modalities as indicated    Sanjuana Mae, PTA 07/31/2023, 3:39 PM

## 2023-08-02 ENCOUNTER — Ambulatory Visit

## 2023-08-02 DIAGNOSIS — M6281 Muscle weakness (generalized): Secondary | ICD-10-CM | POA: Diagnosis not present

## 2023-08-02 DIAGNOSIS — R252 Cramp and spasm: Secondary | ICD-10-CM

## 2023-08-02 DIAGNOSIS — M25651 Stiffness of right hip, not elsewhere classified: Secondary | ICD-10-CM

## 2023-08-02 DIAGNOSIS — M25551 Pain in right hip: Secondary | ICD-10-CM | POA: Diagnosis not present

## 2023-08-02 DIAGNOSIS — R29898 Other symptoms and signs involving the musculoskeletal system: Secondary | ICD-10-CM

## 2023-08-02 NOTE — Therapy (Signed)
 OUTPATIENT PHYSICAL THERAPY LOWER EXTREMITY TREATMENT   Patient Name: [name] [name] MRN: [id number] DOB:1941-02-20, 83 y.o., female Today's Date: 08/02/2023  END OF SESSION:  PT End of Session - 08/02/23 1447     Visit Number 4    Number of Visits 16    Date for PT Re-Evaluation 09/13/23    Authorization Type HU[employer] MCR    Authorization Time Period 16 VISITS APPROVED FOR PT 07/19/2023-09/13/2023    Authorization - Visit Number 4    Authorization - Number of Visits 16    Progress Note Due on Visit 10    PT Start Time 1447    PT Stop Time 1530    PT Time Calculation (min) 43 min    Activity Tolerance Patient tolerated treatment well    Behavior During Therapy WFL for tasks assessed/performed            Past Medical History:  Diagnosis Date   Anxiety    intermittent   Arthritis    blood in stool    CKD (chronic kidney disease), stage III (HCC)    COPD (chronic obstructive pulmonary disease) (HCC)    Diverticulosis    Emphysema lung (HCC)    Fibromyalgia    GERD (gastroesophageal reflux disease)    Glaucoma    Hemorrhoids    HSV-2 (herpes simplex virus 2) infection 2009   Hypothyroidism    OSA on CPAP 07/27/2006   Osteoporosis    Rectal bleeding    with hemorrhoids   Past Surgical History:  Procedure Laterality Date   BUNIONECTOMY  [id number]   CARPAL TUNNEL RELEASE Right 12/08/2016   Procedure: CARPAL TUNNEL RELEASE;  Surgeon: Cindee Salt, MD;  Location: [facility];  Service: Orthopedics;  Laterality: Right;  REG/FAB   CARPAL TUNNEL RELEASE Left 07/02/2021   Procedure: CARPAL TUNNEL RELEASE LEFT;  Surgeon: Cindee Salt, MD;  Location: [facility];  Service: Orthopedics;  Laterality: Left;  45 MIN   CERVICAL FUSION  2008   EYE SURGERY     cataract and glaucoma   hemorrhoids removed     MENISCUS REPAIR Left    TOTAL HIP ARTHROPLASTY Right 07/03/2023   Procedure: TOTAL HIP ARTHROPLASTY ANTERIOR APPROACH;  Surgeon: Jodi Geralds,  MD;  Location: [facility];  Service: Orthopedics;  Laterality: Right;   TRIGGER FINGER RELEASE  2008   Patient Active Problem List   Diagnosis Date Noted   Pre-operative clearance 05/26/2023   Primary osteoarthritis of left knee 02/11/2020   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   Right arm pain 03/07/2019   Diverticulosis 03/06/2019   CKD (chronic kidney disease), stage III (HCC) 09/06/2018   Allergic rhinitis 08/13/2018   Hypothyroid 02/05/2018   Osteopenia with high risk of fracture 04/26/2017   Bilateral tinnitus 04/25/2017   Restrictive lung disease 04/07/2017   Centrilobular emphysema (HCC) 01/19/2017   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   Trigger ring finger of right hand 08/12/2016   Genital herpes 02/16/2016   Primary osteoarthritis of right hip 07/27/2015   ASCVD (arteriosclerotic cardiovascular disease) 02/05/2015   Rectal bleeding 02/05/2015   Pulmonary nodules 02/05/2015   Glaucoma 01/06/2015   Fibromyalgia 01/06/2015   Elevated cholesterol with elevated triglycerides 01/06/2015   Obstructive sleep apnea on CPAP 01/06/2015   Osteoporosis 01/06/2015    PCP: Dr Nani Gasser  REFERRING PROVIDER: Dr Jodi Geralds  REFERRING DIAG: R hip pain s/p R THA  THERAPY DIAG:  Stiffness of right hip, not elsewhere classified  Muscle  weakness (generalized)  Other symptoms and signs involving the musculoskeletal system  Pain in right hip  Cramp and spasm  Rationale for Evaluation and Treatment: Rehabilitation  ONSET DATE: 07/03/23 surgery date   SUBJECTIVE:   SUBJECTIVE STATEMENT: Patient reports her hip feels fine today and she is using walker for long distances/uneven terrain.  PERTINENT HISTORY: Hip pathology for > 5 years; COPD; emphysema; cervical disc surgery; arthritis knees and hands  PAIN:  Are you having pain? Yes: NPRS scale: 1/10 Pain location: R hip  Pain description: tenderness; soreness; aching  Aggravating factors: walking; moving  around more  Relieving factors: rest   PRECAUTIONS: Anterior hip - no restrictions per pt per MD  RED FLAGS: None   WEIGHT BEARING RESTRICTIONS: No  FALLS:  Has patient fallen in last 6 months? No  LIVING ENVIRONMENT: Lives with: lives with their family and lives alone Lives in: House/apartment Stairs: No Has following equipment at home: Single point cane; Environmental consultant - 2 wheeled   OCCUPATION: retired; [employer]; household chores; silver sneakers; church work   PLOF: Independent  PATIENT GOALS: walk without assistive device; return to silver sneakers and home exercise   NEXT MD VISIT: 08/17/23  OBJECTIVE:  Note: Objective measures were completed at Evaluation unless otherwise noted.  DIAGNOSTIC FINDINGS: 03/02/23 XR IMPRESSION: 1. Severe osteoarthritis of the right hip, substantially progressive from 07/22/2015. 2. Moderate osteoarthritis of the left hip. 3. L4-5 spondylosis.  PATIENT SURVEYS:  LEFS 24/80; 30%  COGNITION: Overall cognitive status: Within functional limits for tasks assessed     SENSATION: WFL  EDEMA:  Minimal   MUSCLE LENGTH: Hamstrings: Right 55 deg; Left 70 deg Thomas test: Right 0 deg; Left 10 deg  POSTURE: rounded shoulders, forward head, flexed trunk , and weight shift left  PALPATION: Tender anterior hip Scar anterior hip   LOWER EXTREMITY ROM:  Active ROM Right eval Left eval  Hip flexion 90   Hip extension 0   Hip abduction 20   Hip adduction    Hip internal rotation    Hip external rotation    Knee flexion 120   Knee extension 0   Ankle dorsiflexion    Ankle plantarflexion    Ankle inversion    Ankle eversion     (Blank rows = not tested)  LOWER EXTREMITY MMT: R not tested resistively - moving well against gravity   L LE - WNL's    MMT Right eval Left eval  Hip flexion    Hip extension    Hip abduction    Hip adduction    Hip internal rotation    Hip external rotation    Knee flexion    Knee extension     Ankle dorsiflexion    Ankle plantarflexion    Ankle inversion    Ankle eversion     (Blank rows = not tested)   FUNCTIONAL TESTS:  5 times sit to stand: 13.63 sec use of UEs on table   GAIT: Distance walked: 40 Assistive device utilized: Walker - 2 wheeled Level of assistance: Complete Independence Comments: antalgic gait limp R LE   [facility] Adult PT Treatment:                                                DATE: 08/02/2023 Therapeutic Exercise: Standing: Heel raises x20 Hip abd 2x10 (B) Hip extension 2x10 (  B) Mini squats x10 Supine: Bridges 2x10 Bent knee fall out + GTB x15 (B) Bridges + hip abd iso GTB x10 Seated: Clamshells + GTB 2x10 (Corner of table) STS no HHA --> chair in front x5, x3 Therapeutic Activity: Walking with SPC  Cone weaving (SPC) Cone slides out/in (SPC)    [facility] Adult PT Treatment:                                                DATE: 07/31/2023 Therapeutic Exercise: Self-myofascial massage with 4" ball Hooklying hip add isometric + ball b/w knees 10x5" Hooklying hip abd + black TB 10x5" Standing at counter: Resisted side step out --> hip abd + YTB crossed at ankles Resisted side stepping + YTB crossed at ankles Hip extension 2x10 Marching 2x20 Gait: Gait training with [facility]   [facility] Adult PT Treatment:                                                DATE: 07/26/2023 Therapeutic Exercise: Supine heel slides AROM Hooklying hip add isometric (ball squeeze) 10x5" Bridges 2x15 Hooklying isometric hip ER press with gait belt Hip ER isometric with black TB --> bilateral  Quad set + towel roll behind knee Standing: Hip extension 2x10 Hip abd 2x10 Knee flexion 2x10 Unilateral R hip flexion 2x10 Therapeutic Activity: Adjusting FWW and SPC to proper height Walking with SPC Stair navigation with SPC + 1 railing                                                                                                                               PATIENT  EDUCATION:  Education details: Updated HEP  Person educated: Patient Education method: Programmer, multimedia, Facilities manager, Actor cues, Verbal cues, and Handouts Education comprehension: verbalized understanding, returned demonstration, verbal cues required, tactile cues required, and needs further education  HOME EXERCISE PROGRAM: Access Code: [email or web address] URL: [email or web address] Date: 08/02/2023 Prepared by: Carlynn Herald  Exercises - Supine Bridge with Resistance Band  - 1 x daily - 7 x weekly - 3 sets - 10 reps - Hooklying Single Leg Bent Knee Fallouts with Resistance  - 1 x daily - 7 x weekly - 3 sets - 10 reps - Seated Hip Adduction Isometrics with Ball  - 1 x daily - 7 x weekly - 3 sets - 10 reps - 5 sec hold - Seated Hip Abduction  - 1 x daily - 7 x weekly - 3 sets - 10 reps - Sit to Stand Without Arm Support  - 1 x daily - 7 x weekly - 3 sets - 10 reps - Standing Hip Extension with Counter Support  - 2 x daily - 7 x  weekly - 1-2 sets - 10 reps - 3-5 sec  hold - Standing Hip Abduction with Counter Support  - 1 x daily - 7 x weekly - 2-3 sets - 10 reps - 2-3 sec  hold - Standing Knee Flexion Strengthening at Chair  - 2 x daily - 7 x weekly - 1 sets - 10 reps - 2-3 sec  hold - Standing Hip Flexion  - 2 x daily - 7 x weekly - 1 sets - 10 reps - 2-3 sec  hold - Mini Squat with Counter Support  - 1 x daily - 7 x weekly - 3 sets - 10 reps  ASSESSMENT:  CLINICAL IMPRESSION: Cone weaving and cone slides incorporated to challenge dynamic balance and reactive strategies. Hip strengthening exercises progressed with no exacerbation of pain. Patient able to complete sit to stand with no UE support, demonstrating improved anterior weight shifting (fatigued quickly).   OBJECTIVE IMPAIRMENTS: Abnormal gait, decreased activity tolerance, decreased balance, decreased endurance, decreased mobility, difficulty walking, decreased ROM, decreased strength, impaired flexibility, improper body  mechanics, postural dysfunction, and pain.   ACTIVITY LIMITATIONS: carrying, lifting, bending, standing, squatting, sleeping, stairs, transfers, bed mobility, and locomotion level  PARTICIPATION LIMITATIONS: meal prep, cleaning, laundry, driving, shopping, and community activity  PERSONAL FACTORS: Past/current experiences, Time since onset of injury/illness/exacerbation, and 1-2 comorbidities: COPD; emphysema; sedentary activity level  are also affecting patient's functional outcome.   REHAB POTENTIAL: Good  CLINICAL DECISION MAKING: Evolving/moderate complexity  EVALUATION COMPLEXITY: Moderate   GOALS: Goals reviewed with patient? Yes  SHORT TERM GOALS: Target date: 08/16/2023  Independent in initial HEP  Baseline: Goal status: MET  2.  Independent in basic transfers sit <-> stand; bed mobility Baseline:  Goal status: MET  3.  Ambulation with single point cane with safe, functional gait for in the home Baseline:  Goal status: MET   LONG TERM GOALS: Target date: 09/13/2023  Independent in all transfers and transitional activities  Baseline:  Goal status: INITIAL  2.  4+/5 to 5/5 strength R LE  Baseline:  Goal status: INITIAL  3.  Independent gait without assistive device for community distances  Baseline:  Goal status: INITIAL  4.  Return to community based exercise such as silver sneakers 2 days/week  Baseline:  Goal status: INITIAL  5.  Independent in HEP  Baseline:  Goal status: INITIAL  6.  Improve LEFS by 20% or greater  Baseline: LEFS 24/80; 30% Goal status: INITIAL   PLAN:  PT FREQUENCY: 2x/week  PT DURATION: 8 weeks  PLANNED INTERVENTIONS: 97110-Therapeutic exercises, 97530- Therapeutic activity, 97112- Neuromuscular re-education, 97535- Self Care, 14782- Manual therapy, [phone]- Gait training, [phone]- Aquatic Therapy, [phone]- Ionotophoresis 4mg /ml Dexamethasone, Patient/Family education, Balance training, Stair training, Taping, Dry Needling,  Cryotherapy, and Moist heat  PLAN FOR NEXT SESSION: review and progress exercises prn; progress hip strengthening and balance exercises as tolerated; manual work and modalities as indicated    Sanjuana Mae, PTA 08/02/2023, 3:31 PM

## 2023-08-07 ENCOUNTER — Ambulatory Visit

## 2023-08-07 DIAGNOSIS — M25551 Pain in right hip: Secondary | ICD-10-CM | POA: Diagnosis not present

## 2023-08-07 DIAGNOSIS — R252 Cramp and spasm: Secondary | ICD-10-CM | POA: Diagnosis not present

## 2023-08-07 DIAGNOSIS — R29898 Other symptoms and signs involving the musculoskeletal system: Secondary | ICD-10-CM | POA: Diagnosis not present

## 2023-08-07 DIAGNOSIS — M25651 Stiffness of right hip, not elsewhere classified: Secondary | ICD-10-CM | POA: Diagnosis not present

## 2023-08-07 DIAGNOSIS — M6281 Muscle weakness (generalized): Secondary | ICD-10-CM

## 2023-08-07 NOTE — Therapy (Signed)
 OUTPATIENT PHYSICAL THERAPY LOWER EXTREMITY TREATMENT   Patient Name: [name] [name] MRN: [id number] DOB:1941/04/16, 83 y.o., female Today's Date: 08/07/2023  END OF SESSION:  PT End of Session - 08/07/23 1447     Visit Number 5    Number of Visits 16    Date for PT Re-Evaluation 09/13/23    Authorization Type HU[employer] MCR    Authorization Time Period 16 VISITS APPROVED FOR PT 07/19/2023-09/13/2023    Authorization - Visit Number 5    Authorization - Number of Visits 16    Progress Note Due on Visit 10    PT Start Time 1448    PT Stop Time 1528    PT Time Calculation (min) 40 min    Activity Tolerance Patient tolerated treatment well    Behavior During Therapy WFL for tasks assessed/performed            Past Medical History:  Diagnosis Date   Anxiety    intermittent   Arthritis    blood in stool    CKD (chronic kidney disease), stage III (HCC)    COPD (chronic obstructive pulmonary disease) (HCC)    Diverticulosis    Emphysema lung (HCC)    Fibromyalgia    GERD (gastroesophageal reflux disease)    Glaucoma    Hemorrhoids    HSV-2 (herpes simplex virus 2) infection 2009   Hypothyroidism    OSA on CPAP 07/27/2006   Osteoporosis    Rectal bleeding    with hemorrhoids   Past Surgical History:  Procedure Laterality Date   BUNIONECTOMY  [id number]   CARPAL TUNNEL RELEASE Right 12/08/2016   Procedure: CARPAL TUNNEL RELEASE;  Surgeon: Cindee Salt, MD;  Location: [facility];  Service: Orthopedics;  Laterality: Right;  REG/FAB   CARPAL TUNNEL RELEASE Left 07/02/2021   Procedure: CARPAL TUNNEL RELEASE LEFT;  Surgeon: Cindee Salt, MD;  Location: [facility];  Service: Orthopedics;  Laterality: Left;  45 MIN   CERVICAL FUSION  2008   EYE SURGERY     cataract and glaucoma   hemorrhoids removed     MENISCUS REPAIR Left    TOTAL HIP ARTHROPLASTY Right 07/03/2023   Procedure: TOTAL HIP ARTHROPLASTY ANTERIOR APPROACH;  Surgeon: Jodi Geralds,  MD;  Location: [facility];  Service: Orthopedics;  Laterality: Right;   TRIGGER FINGER RELEASE  2008   Patient Active Problem List   Diagnosis Date Noted   Pre-operative clearance 05/26/2023   Primary osteoarthritis of left knee 02/11/2020   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   Right arm pain 03/07/2019   Diverticulosis 03/06/2019   CKD (chronic kidney disease), stage III (HCC) 09/06/2018   Allergic rhinitis 08/13/2018   Hypothyroid 02/05/2018   Osteopenia with high risk of fracture 04/26/2017   Bilateral tinnitus 04/25/2017   Restrictive lung disease 04/07/2017   Centrilobular emphysema (HCC) 01/19/2017   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   Trigger ring finger of right hand 08/12/2016   Genital herpes 02/16/2016   Primary osteoarthritis of right hip 07/27/2015   ASCVD (arteriosclerotic cardiovascular disease) 02/05/2015   Rectal bleeding 02/05/2015   Pulmonary nodules 02/05/2015   Glaucoma 01/06/2015   Fibromyalgia 01/06/2015   Elevated cholesterol with elevated triglycerides 01/06/2015   Obstructive sleep apnea on CPAP 01/06/2015   Osteoporosis 01/06/2015    PCP: Dr Nani Gasser  REFERRING PROVIDER: Dr Jodi Geralds  REFERRING DIAG: R hip pain s/p R THA  THERAPY DIAG:  Stiffness of right hip, not elsewhere classified  Muscle  weakness (generalized)  Other symptoms and signs involving the musculoskeletal system  Pain in right hip  Cramp and spasm  Rationale for Evaluation and Treatment: Rehabilitation  ONSET DATE: 07/03/23 surgery date   SUBJECTIVE:   SUBJECTIVE STATEMENT: Patient reports 1/10 pain in hip, states it's more discomfort and numbness around incision site. Patient is compliant with HEP.    PERTINENT HISTORY: Hip pathology for > 5 years; COPD; emphysema; cervical disc surgery; arthritis knees and hands  PAIN:  Are you having pain? Yes: NPRS scale: 1/10 Pain location: R hip  Pain description: tenderness; soreness; aching   Aggravating factors: walking; moving around more  Relieving factors: rest   PRECAUTIONS: Anterior hip - no restrictions per pt per MD  RED FLAGS: None   WEIGHT BEARING RESTRICTIONS: No  FALLS:  Has patient fallen in last 6 months? No  LIVING ENVIRONMENT: Lives with: lives with their family and lives alone Lives in: House/apartment Stairs: No Has following equipment at home: Single point cane; Environmental consultant - 2 wheeled   OCCUPATION: retired; [employer]; household chores; silver sneakers; church work   PLOF: Independent  PATIENT GOALS: walk without assistive device; return to silver sneakers and home exercise   NEXT MD VISIT: 08/17/23  OBJECTIVE:  Note: Objective measures were completed at Evaluation unless otherwise noted.  DIAGNOSTIC FINDINGS: 03/02/23 XR IMPRESSION: 1. Severe osteoarthritis of the right hip, substantially progressive from 07/22/2015. 2. Moderate osteoarthritis of the left hip. 3. L4-5 spondylosis.  PATIENT SURVEYS:  LEFS 24/80; 30%  COGNITION: Overall cognitive status: Within functional limits for tasks assessed     SENSATION: WFL  EDEMA:  Minimal   MUSCLE LENGTH: Hamstrings: Right 55 deg; Left 70 deg Thomas test: Right 0 deg; Left 10 deg  POSTURE: rounded shoulders, forward head, flexed trunk , and weight shift left  PALPATION: Tender anterior hip Scar anterior hip   LOWER EXTREMITY ROM:  Active ROM Right eval Left eval  Hip flexion 90   Hip extension 0   Hip abduction 20   Hip adduction    Hip internal rotation    Hip external rotation    Knee flexion 120   Knee extension 0   Ankle dorsiflexion    Ankle plantarflexion    Ankle inversion    Ankle eversion     (Blank rows = not tested)  LOWER EXTREMITY MMT: R not tested resistively - moving well against gravity   L LE - WNL's    MMT Right eval Left eval  Hip flexion    Hip extension    Hip abduction    Hip adduction    Hip internal rotation    Hip external rotation     Knee flexion    Knee extension    Ankle dorsiflexion    Ankle plantarflexion    Ankle inversion    Ankle eversion     (Blank rows = not tested)   FUNCTIONAL TESTS:  5 times sit to stand: 13.63 sec use of UEs on table   GAIT: Distance walked: 40 Assistive device utilized: Walker - 2 wheeled Level of assistance: Complete Independence Comments: antalgic gait limp R LE    [facility] Adult PT Treatment:                                                DATE: 08/07/2023 Therapeutic Exercise: Standing: Heel raises x20  Hip abd 2x10 (B) Hip extension 2x10 (B) HS curls 2x10 (B) Mini squats x10 Standing marching + 2#AW Seated: Isometric hip add ball squeeze  Clamshells + GTB 2x10 Supine: Bent knee fall out + blue TB (R) Bridges + blue TB  Core marching Therapeutic Activity: Side stepping  Backwards walking Gait: Gait training with no AD    [facility] Adult PT Treatment:                                                DATE: 08/02/2023 Therapeutic Exercise: Standing: Heel raises x20 Hip abd 2x10 (B) Hip extension 2x10 (B) Mini squats x10 Supine: Bridges 2x10 Bent knee fall out + GTB x15 (B) Bridges + hip abd iso GTB x10 Seated: Clamshells + GTB 2x10 (Corner of table) STS no HHA --> chair in front x5, x3 Therapeutic Activity: Walking with SPC  Cone weaving (SPC) Cone slides out/in (SPC)    [facility] Adult PT Treatment:                                                DATE: 07/31/2023 Therapeutic Exercise: Self-myofascial massage with 4" ball Hooklying hip add isometric + ball b/w knees 10x5" Hooklying hip abd + black TB 10x5" Standing at counter: Resisted side step out --> hip abd + YTB crossed at ankles Resisted side stepping + YTB crossed at ankles Hip extension 2x10 Marching 2x20 Gait: Gait training with [facility]                                                                                                                               PATIENT EDUCATION:  Education details:  Updated HEP  Person educated: Patient Education method: Explanation, Demonstration, Tactile cues, Verbal cues, and Handouts Education comprehension: verbalized understanding, returned demonstration, verbal cues required, tactile cues required, and needs further education  HOME EXERCISE PROGRAM: Access Code: [email or web address] URL: [email or web address] Date: 08/07/2023 Prepared by: Carlynn Herald  Exercises - Supine Bridge with Resistance Band  - 1 x daily - 7 x weekly - 3 sets - 10 reps - Hooklying Single Leg Bent Knee Fallouts with Resistance  - 1 x daily - 7 x weekly - 3 sets - 10 reps - Seated Hip Adduction Isometrics with Ball  - 1 x daily - 7 x weekly - 3 sets - 10 reps - 5 sec hold - Seated Hip Abduction  - 1 x daily - 7 x weekly - 3 sets - 10 reps - Sit to Stand Without Arm Support  - 1 x daily - 7 x weekly - 3 sets - 10 reps - Standing Hip Extension with Counter Support  - 2 x daily - 7 x  weekly - 1-2 sets - 10 reps - 3-5 sec  hold - Standing Hip Abduction with Counter Support  - 1 x daily - 7 x weekly - 2-3 sets - 10 reps - 2-3 sec  hold - Standing Knee Flexion Strengthening at Chair  - 2 x daily - 7 x weekly - 1 sets - 10 reps - 2-3 sec  hold - Standing Hip Flexion  - 2 x daily - 7 x weekly - 1 sets - 10 reps - 2-3 sec  hold - Mini Squat with Counter Support  - 1 x daily - 7 x weekly - 3 sets - 10 reps - Supine March  - 1 x daily - 7 x weekly - 3 sets - 10 reps  ASSESSMENT:  CLINICAL IMPRESSION: Gait training progressed with no AD; decreased heel strike noted on R LE. Multi-directional walking incorporated to challenge dynamic balance and functional hip strengthening. Patient reported hip fatigue but no exacerbation of pain with all exercises today.    OBJECTIVE IMPAIRMENTS: Abnormal gait, decreased activity tolerance, decreased balance, decreased endurance, decreased mobility, difficulty walking, decreased ROM, decreased strength, impaired flexibility, improper body  mechanics, postural dysfunction, and pain.   ACTIVITY LIMITATIONS: carrying, lifting, bending, standing, squatting, sleeping, stairs, transfers, bed mobility, and locomotion level  PARTICIPATION LIMITATIONS: meal prep, cleaning, laundry, driving, shopping, and community activity  PERSONAL FACTORS: Past/current experiences, Time since onset of injury/illness/exacerbation, and 1-2 comorbidities: COPD; emphysema; sedentary activity level  are also affecting patient's functional outcome.   REHAB POTENTIAL: Good  CLINICAL DECISION MAKING: Evolving/moderate complexity  EVALUATION COMPLEXITY: Moderate   GOALS: Goals reviewed with patient? Yes  SHORT TERM GOALS: Target date: 08/16/2023  Independent in initial HEP  Baseline: Goal status: MET  2.  Independent in basic transfers sit <-> stand; bed mobility Baseline:  Goal status: MET  3.  Ambulation with single point cane with safe, functional gait for in the home Baseline:  Goal status: MET   LONG TERM GOALS: Target date: 09/13/2023  Independent in all transfers and transitional activities  Baseline:  Goal status: INITIAL  2.  4+/5 to 5/5 strength R LE  Baseline:  Goal status: INITIAL  3.  Independent gait without assistive device for community distances  Baseline:  Goal status: INITIAL  4.  Return to community based exercise such as silver sneakers 2 days/week  Baseline:  Goal status: INITIAL  5.  Independent in HEP  Baseline:  Goal status: INITIAL  6.  Improve LEFS by 20% or greater  Baseline: LEFS 24/80; 30% Goal status: INITIAL   PLAN:  PT FREQUENCY: 2x/week  PT DURATION: 8 weeks  PLANNED INTERVENTIONS: 97110-Therapeutic exercises, 97530- Therapeutic activity, 97112- Neuromuscular re-education, 97535- Self Care, 16109- Manual therapy, [phone]- Gait training, [phone]- Aquatic Therapy, [phone]- Ionotophoresis 4mg /ml Dexamethasone, Patient/Family education, Balance training, Stair training, Taping, Dry Needling,  Cryotherapy, and Moist heat  PLAN FOR NEXT SESSION: Progress hip strengthening and balance exercises as tolerated  Sanjuana Mae, PTA 08/07/2023, 3:31 PM

## 2023-08-09 ENCOUNTER — Encounter: Payer: Self-pay | Admitting: Rehabilitative and Restorative Service Providers"

## 2023-08-09 ENCOUNTER — Ambulatory Visit: Attending: Orthopedic Surgery | Admitting: Rehabilitative and Restorative Service Providers"

## 2023-08-09 DIAGNOSIS — M6281 Muscle weakness (generalized): Secondary | ICD-10-CM | POA: Insufficient documentation

## 2023-08-09 DIAGNOSIS — M25551 Pain in right hip: Secondary | ICD-10-CM | POA: Insufficient documentation

## 2023-08-09 DIAGNOSIS — M25651 Stiffness of right hip, not elsewhere classified: Secondary | ICD-10-CM | POA: Insufficient documentation

## 2023-08-09 DIAGNOSIS — R252 Cramp and spasm: Secondary | ICD-10-CM | POA: Insufficient documentation

## 2023-08-09 DIAGNOSIS — R29898 Other symptoms and signs involving the musculoskeletal system: Secondary | ICD-10-CM | POA: Insufficient documentation

## 2023-08-09 NOTE — Therapy (Signed)
 OUTPATIENT PHYSICAL THERAPY LOWER EXTREMITY TREATMENT   Patient Name: [name] [name] MRN: [id number] DOB:[birth year], 83 y.o., female Today's Date: 08/09/2023  END OF SESSION:  PT End of Session - 08/09/23 1450     Visit Number 6    Number of Visits 16    Date for PT Re-Evaluation 09/13/23    Authorization Type HU[employer] MCR    Authorization Time Period 16 VISITS APPROVED FOR PT 07/19/2023-09/13/2023    Authorization - Visit Number 6    Authorization - Number of Visits 16    Progress Note Due on Visit 10    PT Start Time 1447    PT Stop Time 1530    PT Time Calculation (min) 43 min    Activity Tolerance Patient tolerated treatment well            Past Medical History:  Diagnosis Date   Anxiety    intermittent   Arthritis    blood in stool    CKD (chronic kidney disease), stage III (HCC)    COPD (chronic obstructive pulmonary disease) (HCC)    Diverticulosis    Emphysema lung (HCC)    Fibromyalgia    GERD (gastroesophageal reflux disease)    Glaucoma    Hemorrhoids    HSV-2 (herpes simplex virus 2) infection 2009   Hypothyroidism    OSA on CPAP 07/27/2006   Osteoporosis    Rectal bleeding    with hemorrhoids   Past Surgical History:  Procedure Laterality Date   BUNIONECTOMY  4098,[id number]   CARPAL TUNNEL RELEASE Right 12/08/2016   Procedure: CARPAL TUNNEL RELEASE;  Surgeon: Cindee Salt, MD;  Location: [facility];  Service: Orthopedics;  Laterality: Right;  REG/FAB   CARPAL TUNNEL RELEASE Left 07/02/2021   Procedure: CARPAL TUNNEL RELEASE LEFT;  Surgeon: Cindee Salt, MD;  Location: [facility];  Service: Orthopedics;  Laterality: Left;  45 MIN   CERVICAL FUSION  2008   EYE SURGERY     cataract and glaucoma   hemorrhoids removed     MENISCUS REPAIR Left    TOTAL HIP ARTHROPLASTY Right 07/03/2023   Procedure: TOTAL HIP ARTHROPLASTY ANTERIOR APPROACH;  Surgeon: Jodi Geralds, MD;  Location: [facility];  Service: Orthopedics;  Laterality:  Right;   TRIGGER FINGER RELEASE  2008   Patient Active Problem List   Diagnosis Date Noted   Pre-operative clearance 05/26/2023   Primary osteoarthritis of left knee 02/11/2020   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   Right arm pain 03/07/2019   Diverticulosis 03/06/2019   CKD (chronic kidney disease), stage III (HCC) 09/06/2018   Allergic rhinitis 08/13/2018   Hypothyroid 02/05/2018   Osteopenia with high risk of fracture 04/26/2017   Bilateral tinnitus 04/25/2017   Restrictive lung disease 04/07/2017   Centrilobular emphysema (HCC) 01/19/2017   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   Trigger ring finger of right hand 08/12/2016   Genital herpes 02/16/2016   Primary osteoarthritis of right hip 07/27/2015   ASCVD (arteriosclerotic cardiovascular disease) 02/05/2015   Rectal bleeding 02/05/2015   Pulmonary nodules 02/05/2015   Glaucoma 01/06/2015   Fibromyalgia 01/06/2015   Elevated cholesterol with elevated triglycerides 01/06/2015   Obstructive sleep apnea on CPAP 01/06/2015   Osteoporosis 01/06/2015    PCP: Dr Nani Gasser  REFERRING PROVIDER: Dr Jodi Geralds  REFERRING DIAG: R hip pain s/p R THA  THERAPY DIAG:  Stiffness of right hip, not elsewhere classified  Muscle weakness (generalized)  Other symptoms and signs involving the musculoskeletal  system  Pain in right hip  Cramp and spasm  Rationale for Evaluation and Treatment: Rehabilitation  ONSET DATE: 07/03/23 surgery date   SUBJECTIVE:   SUBJECTIVE STATEMENT: Patient reports 1/10 pain in hip. Did not sleep well last night. Worried about her siblings.    PERTINENT HISTORY: Hip pathology for > 5 years; COPD; emphysema; cervical disc surgery; arthritis knees and hands  PAIN:  Are you having pain? Yes: NPRS scale: 0-1/10 Pain location: R hip  Pain description: tenderness; soreness; aching  Aggravating factors: walking; moving around more  Relieving factors: rest   PRECAUTIONS:  Anterior hip - no restrictions per pt per MD  RED FLAGS: None   WEIGHT BEARING RESTRICTIONS: No  FALLS:  Has patient fallen in last 6 months? No  LIVING ENVIRONMENT: Lives with: lives with their family and lives alone Lives in: House/apartment Stairs: No Has following equipment at home: Single point cane; Environmental consultant - 2 wheeled   OCCUPATION: retired; [employer]; household chores; silver sneakers; church work   PLOF: Independent  PATIENT GOALS: walk without assistive device; return to silver sneakers and home exercise   NEXT MD VISIT: 08/17/23  OBJECTIVE:  Note: Objective measures were completed at Evaluation unless otherwise noted.  DIAGNOSTIC FINDINGS: 03/02/23 XR IMPRESSION: 1. Severe osteoarthritis of the right hip, substantially progressive from 07/22/2015. 2. Moderate osteoarthritis of the left hip. 3. L4-5 spondylosis.  PATIENT SURVEYS:  LEFS 24/80; 30%  COGNITION: Overall cognitive status: Within functional limits for tasks assessed     SENSATION: WFL  EDEMA:  Minimal   MUSCLE LENGTH: Hamstrings: Right 55 deg; Left 70 deg Thomas test: Right 0 deg; Left 10 deg  POSTURE: rounded shoulders, forward head, flexed trunk , and weight shift left  PALPATION: Tender anterior hip Scar anterior hip   LOWER EXTREMITY ROM:  Active ROM Right eval Right 08/09/23  Hip flexion 90 110  Hip extension 0 5  Hip abduction 20 64  Hip adduction    Hip internal rotation    Hip external rotation    Knee flexion 120 132  Knee extension 0   Ankle dorsiflexion    Ankle plantarflexion    Ankle inversion    Ankle eversion     (Blank rows = not tested)  LOWER EXTREMITY MMT: R not tested resistively - moving well against gravity   L LE - WNL's    MMT Right eval Left eval  Hip flexion    Hip extension    Hip abduction    Hip adduction    Hip internal rotation    Hip external rotation    Knee flexion    Knee extension    Ankle dorsiflexion    Ankle plantarflexion     Ankle inversion    Ankle eversion     (Blank rows = not tested)   FUNCTIONAL TESTS:  5 times sit to stand: 13.63 sec use of UEs on table   GAIT: Distance walked: 40 Assistive device utilized: Walker - 2 wheeled Level of assistance: Complete Independence Comments: antalgic gait limp R LE   [facility] Adult PT Treatment:                                                DATE: 08/09/2023 Therapeutic Exercise: Supine: Alternate hip abduction in hooklying blue TB R/L x 10  Bridges + blue TB x 10  Core marching x 10  Therapeutic Activity: Sit to stand x 15 initially using UE's to assist; last 10 no UE support from slightly elevated treatment table  Mini squat x 10  SLS R/L 20 sec x 3  Side stepping 10 ft x 5 each R/L Backwards walking/heel to toe 10 ft x 5 each  Manual:   STM working through the anterior hip and thigh   Gentle scar massage/stretching  PT assist to stretch R hip flexors supine Thomas 30 sec x 1; 45 sec x 1  Gait: Gait training with [facility]    [facility] Adult PT Treatment:                                                DATE: 08/07/2023 Therapeutic Exercise: Standing: Heel raises x20 Hip abd 2x10 (B) Hip extension 2x10 (B) HS curls 2x10 (B) Mini squats x10 Standing marching + 2#AW Seated: Isometric hip add ball squeeze  Clamshells + GTB 2x10 Supine: Bent knee fall out + blue TB (R) Bridges + blue TB  Core marching Therapeutic Activity: Side stepping  Backwards walking Gait: Gait training with no AD    [facility] Adult PT Treatment:                                                DATE: 08/02/2023 Therapeutic Exercise: Standing: Heel raises x20 Hip abd 2x10 (B) Hip extension 2x10 (B) Mini squats x10 Supine: Bridges 2x10 Bent knee fall out + GTB x15 (B) Bridges + hip abd iso GTB x10 Seated: Clamshells + GTB 2x10 (Corner of table) STS no HHA --> chair in front x5, x3 Therapeutic Activity: Walking with SPC  Cone weaving (SPC) Cone slides out/in (SPC)    [facility]  Adult PT Treatment:                                                DATE: 07/31/2023 Therapeutic Exercise: Self-myofascial massage with 4" ball Hooklying hip add isometric + ball b/w knees 10x5" Hooklying hip abd + black TB 10x5" Standing at counter: Resisted side step out --> hip abd + YTB crossed at ankles Resisted side stepping + YTB crossed at ankles Hip extension 2x10 Marching 2x20 Gait: Gait training with [facility]                                                                                                                               PATIENT EDUCATION:  Education details: Updated HEP  Person educated: Patient Education method: Explanation, Demonstration, Tactile cues, Verbal cues, and Handouts  Education comprehension: verbalized understanding, returned demonstration, verbal cues required, tactile cues required, and needs further education  HOME EXERCISE PROGRAM: Access Code: [email or web address] URL: [email or web address] Date: 08/07/2023 Prepared by: Carlynn Herald  Exercises - Supine Bridge with Resistance Band  - 1 x daily - 7 x weekly - 3 sets - 10 reps - Hooklying Single Leg Bent Knee Fallouts with Resistance  - 1 x daily - 7 x weekly - 3 sets - 10 reps - Seated Hip Adduction Isometrics with Ball  - 1 x daily - 7 x weekly - 3 sets - 10 reps - 5 sec hold - Seated Hip Abduction  - 1 x daily - 7 x weekly - 3 sets - 10 reps - Sit to Stand Without Arm Support  - 1 x daily - 7 x weekly - 3 sets - 10 reps - Standing Hip Extension with Counter Support  - 2 x daily - 7 x weekly - 1-2 sets - 10 reps - 3-5 sec  hold - Standing Hip Abduction with Counter Support  - 1 x daily - 7 x weekly - 2-3 sets - 10 reps - 2-3 sec  hold - Standing Knee Flexion Strengthening at Chair  - 2 x daily - 7 x weekly - 1 sets - 10 reps - 2-3 sec  hold - Standing Hip Flexion  - 2 x daily - 7 x weekly - 1 sets - 10 reps - 2-3 sec  hold - Mini Squat with Counter Support  - 1 x daily - 7 x weekly - 3 sets -  10 reps - Supine March  - 1 x daily - 7 x weekly - 3 sets - 10 reps  ASSESSMENT:  CLINICAL IMPRESSION: Address scar and soft tissue tightness with gentle scar massage and STM patient supine. Continued with strengthening and functional activities. Patient reported hip fatigue but no exacerbation of pain treatment.    OBJECTIVE IMPAIRMENTS: Abnormal gait, decreased activity tolerance, decreased balance, decreased endurance, decreased mobility, difficulty walking, decreased ROM, decreased strength, impaired flexibility, improper body mechanics, postural dysfunction, and pain.   ACTIVITY LIMITATIONS: carrying, lifting, bending, standing, squatting, sleeping, stairs, transfers, bed mobility, and locomotion level  PARTICIPATION LIMITATIONS: meal prep, cleaning, laundry, driving, shopping, and community activity  PERSONAL FACTORS: Past/current experiences, Time since onset of injury/illness/exacerbation, and 1-2 comorbidities: COPD; emphysema; sedentary activity level  are also affecting patient's functional outcome.   REHAB POTENTIAL: Good  CLINICAL DECISION MAKING: Evolving/moderate complexity  EVALUATION COMPLEXITY: Moderate   GOALS: Goals reviewed with patient? Yes  SHORT TERM GOALS: Target date: 08/16/2023  Independent in initial HEP  Baseline: Goal status: MET  2.  Independent in basic transfers sit <-> stand; bed mobility Baseline:  Goal status: MET  3.  Ambulation with single point cane with safe, functional gait for in the home Baseline:  Goal status: MET   LONG TERM GOALS: Target date: 09/13/2023  Independent in all transfers and transitional activities  Baseline:  Goal status: INITIAL  2.  4+/5 to 5/5 strength R LE  Baseline:  Goal status: INITIAL  3.  Independent gait without assistive device for community distances  Baseline:  Goal status: INITIAL  4.  Return to community based exercise such as silver sneakers 2 days/week  Baseline:  Goal status:  INITIAL  5.  Independent in HEP  Baseline:  Goal status: INITIAL  6.  Improve LEFS by 20% or greater  Baseline: LEFS 24/80; 30% Goal status: INITIAL   PLAN:  PT FREQUENCY: 2x/week  PT DURATION: 8 weeks  PLANNED INTERVENTIONS: 97110-Therapeutic exercises, 97530- Therapeutic activity, [id number]- Neuromuscular re-education, [phone]- Self Care, 60454- Manual therapy, [phone]- Gait training, [phone]- Aquatic Therapy, [phone]- Ionotophoresis 4mg /ml Dexamethasone, Patient/Family education, Balance training, Stair training, Taping, Dry Needling, Cryotherapy, and Moist heat  PLAN FOR NEXT SESSION: Progress hip strengthening and balance exercises as tolerated  Takila Kronberg Rober Minion, PT 08/09/2023, 2:51 PM

## 2023-08-14 ENCOUNTER — Ambulatory Visit

## 2023-08-14 DIAGNOSIS — R29898 Other symptoms and signs involving the musculoskeletal system: Secondary | ICD-10-CM | POA: Diagnosis not present

## 2023-08-14 DIAGNOSIS — M6281 Muscle weakness (generalized): Secondary | ICD-10-CM | POA: Diagnosis not present

## 2023-08-14 DIAGNOSIS — M25551 Pain in right hip: Secondary | ICD-10-CM

## 2023-08-14 DIAGNOSIS — R252 Cramp and spasm: Secondary | ICD-10-CM

## 2023-08-14 DIAGNOSIS — M25651 Stiffness of right hip, not elsewhere classified: Secondary | ICD-10-CM

## 2023-08-14 NOTE — Therapy (Signed)
 OUTPATIENT PHYSICAL THERAPY LOWER EXTREMITY TREATMENT   Patient Name: [name] [name] MRN: [id number] DOB:[birth year], 83 y.o., female Today's Date: 08/14/2023  END OF SESSION:  PT End of Session - 08/14/23 1449     Visit Number 7    Number of Visits 16    Date for PT Re-Evaluation 09/13/23    Authorization Type HU[employer] MCR    Authorization Time Period 16 VISITS APPROVED FOR PT 07/19/2023-09/13/2023    Authorization - Visit Number 7    Authorization - Number of Visits 16    Progress Note Due on Visit 10    PT Start Time 1450    PT Stop Time 1530    PT Time Calculation (min) 40 min    Activity Tolerance Patient tolerated treatment well    Behavior During Therapy WFL for tasks assessed/performed            Past Medical History:  Diagnosis Date   Anxiety    intermittent   Arthritis    blood in stool    CKD (chronic kidney disease), stage III (HCC)    COPD (chronic obstructive pulmonary disease) (HCC)    Diverticulosis    Emphysema lung (HCC)    Fibromyalgia    GERD (gastroesophageal reflux disease)    Glaucoma    Hemorrhoids    HSV-2 (herpes simplex virus 2) infection 2009   Hypothyroidism    OSA on CPAP 07/27/2006   Osteoporosis    Rectal bleeding    with hemorrhoids   Past Surgical History:  Procedure Laterality Date   BUNIONECTOMY  4132,[id number]   CARPAL TUNNEL RELEASE Right 12/08/2016   Procedure: CARPAL TUNNEL RELEASE;  Surgeon: Cindee Salt, MD;  Location: [facility];  Service: Orthopedics;  Laterality: Right;  REG/FAB   CARPAL TUNNEL RELEASE Left 07/02/2021   Procedure: CARPAL TUNNEL RELEASE LEFT;  Surgeon: Cindee Salt, MD;  Location: [facility];  Service: Orthopedics;  Laterality: Left;  45 MIN   CERVICAL FUSION  2008   EYE SURGERY     cataract and glaucoma   hemorrhoids removed     MENISCUS REPAIR Left    TOTAL HIP ARTHROPLASTY Right 07/03/2023   Procedure: TOTAL HIP ARTHROPLASTY ANTERIOR APPROACH;  Surgeon: Jodi Geralds,  MD;  Location: [facility];  Service: Orthopedics;  Laterality: Right;   TRIGGER FINGER RELEASE  2008   Patient Active Problem List   Diagnosis Date Noted   Pre-operative clearance 05/26/2023   Primary osteoarthritis of left knee 02/11/2020   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   Right arm pain 03/07/2019   Diverticulosis 03/06/2019   CKD (chronic kidney disease), stage III (HCC) 09/06/2018   Allergic rhinitis 08/13/2018   Hypothyroid 02/05/2018   Osteopenia with high risk of fracture 04/26/2017   Bilateral tinnitus 04/25/2017   Restrictive lung disease 04/07/2017   Centrilobular emphysema (HCC) 01/19/2017   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   Trigger ring finger of right hand 08/12/2016   Genital herpes 02/16/2016   Primary osteoarthritis of right hip 07/27/2015   ASCVD (arteriosclerotic cardiovascular disease) 02/05/2015   Rectal bleeding 02/05/2015   Pulmonary nodules 02/05/2015   Glaucoma 01/06/2015   Fibromyalgia 01/06/2015   Elevated cholesterol with elevated triglycerides 01/06/2015   Obstructive sleep apnea on CPAP 01/06/2015   Osteoporosis 01/06/2015    PCP: Dr Nani Gasser  REFERRING PROVIDER: Dr Jodi Geralds  REFERRING DIAG: R hip pain s/p R THA  THERAPY DIAG:  Stiffness of right hip, not elsewhere classified  Muscle  weakness (generalized)  Other symptoms and signs involving the musculoskeletal system  Pain in right hip  Cramp and spasm  Rationale for Evaluation and Treatment: Rehabilitation  ONSET DATE: 07/03/23 surgery date   SUBJECTIVE:   SUBJECTIVE STATEMENT: Patient reports her hip felt better after manual treatment at last PT visit. Patient states she has less numbness but continues to feel achy in hip.    PERTINENT HISTORY: Hip pathology for > 5 years; COPD; emphysema; cervical disc surgery; arthritis knees and hands  PAIN:  Are you having pain? Yes: NPRS scale: 0-1/10 Pain location: R hip  Pain description: tenderness;  soreness; aching  Aggravating factors: walking; moving around more  Relieving factors: rest   PRECAUTIONS: Anterior hip - no restrictions per pt per MD  RED FLAGS: None   WEIGHT BEARING RESTRICTIONS: No  FALLS:  Has patient fallen in last 6 months? No  LIVING ENVIRONMENT: Lives with: lives with their family and lives alone Lives in: House/apartment Stairs: No Has following equipment at home: Single point cane; Environmental consultant - 2 wheeled   OCCUPATION: retired; [employer]; household chores; silver sneakers; church work   PLOF: Independent  PATIENT GOALS: walk without assistive device; return to silver sneakers and home exercise   NEXT MD VISIT: 08/17/23  OBJECTIVE:  Note: Objective measures were completed at Evaluation unless otherwise noted.  DIAGNOSTIC FINDINGS: 03/02/23 XR IMPRESSION: 1. Severe osteoarthritis of the right hip, substantially progressive from 07/22/2015. 2. Moderate osteoarthritis of the left hip. 3. L4-5 spondylosis.  PATIENT SURVEYS:  LEFS 24/80; 30%  COGNITION: Overall cognitive status: Within functional limits for tasks assessed     SENSATION: WFL  EDEMA:  Minimal   MUSCLE LENGTH: Hamstrings: Right 55 deg; Left 70 deg Thomas test: Right 0 deg; Left 10 deg  POSTURE: rounded shoulders, forward head, flexed trunk , and weight shift left  PALPATION: Tender anterior hip Scar anterior hip   LOWER EXTREMITY ROM:  Active ROM Right eval Right 08/09/23  Hip flexion 90 110  Hip extension 0 5  Hip abduction 20 64  Hip adduction    Hip internal rotation    Hip external rotation    Knee flexion 120 132  Knee extension 0   Ankle dorsiflexion    Ankle plantarflexion    Ankle inversion    Ankle eversion     (Blank rows = not tested)  LOWER EXTREMITY MMT: R not tested resistively - moving well against gravity   L LE - WNL's    MMT Right eval Left eval  Hip flexion    Hip extension    Hip abduction    Hip adduction    Hip internal rotation     Hip external rotation    Knee flexion    Knee extension    Ankle dorsiflexion    Ankle plantarflexion    Ankle inversion    Ankle eversion     (Blank rows = not tested)   FUNCTIONAL TESTS:  5 times sit to stand: 13.63 sec use of UEs on table   GAIT: Distance walked: 40 Assistive device utilized: Walker - 2 wheeled Level of assistance: Complete Independence Comments: antalgic gait limp R LE   [facility] Adult PT Treatment:                                                DATE: 08/14/2023 Therapeutic  Exercise: Supine (2 pillows): Core marching Bent knee fall out + blue TB (B) Bridges + black TB 2x10 S/L passive hip flexor stretch Supported butterfly stretch x 1 min Manual Therapy: Roller stick (R) quad/ITB Therapeutic Activity: Walking with SPC --> no AD Backwards walking --> focus on equal stride length Side stepping LEFS     [facility] Adult PT Treatment:                                                DATE: 08/09/2023 Therapeutic Exercise: Supine: Alternate hip abduction in hooklying blue TB R/L x 10  Bridges + blue TB x 10  Core marching x 10  Therapeutic Activity: Sit to stand x 15 initially using UE's to assist; last 10 no UE support from slightly elevated treatment table  Mini squat x 10  SLS R/L 20 sec x 3  Side stepping 10 ft x 5 each R/L Backwards walking/heel to toe 10 ft x 5 each  Manual:   STM working through the anterior hip and thigh   Gentle scar massage/stretching  PT assist to stretch R hip flexors supine Thomas 30 sec x 1; 45 sec x 1  Gait: Gait training with [facility]    [facility] Adult PT Treatment:                                                DATE: 08/07/2023 Therapeutic Exercise: Standing: Heel raises x20 Hip abd 2x10 (B) Hip extension 2x10 (B) HS curls 2x10 (B) Mini squats x10 Standing marching + 2#AW Seated: Isometric hip add ball squeeze  Clamshells + GTB 2x10 Supine: Bent knee fall out + blue TB (R) Bridges + blue TB  Core marching Therapeutic  Activity: Side stepping  Backwards walking Gait: Gait training with no AD                                                                                                                               PATIENT EDUCATION:  Education details: Updated HEP  Person educated: Patient Education method: Explanation, Demonstration, Tactile cues, Verbal cues, and Handouts Education comprehension: verbalized understanding, returned demonstration, verbal cues required, tactile cues required, and needs further education  HOME EXERCISE PROGRAM: Access Code: [email or web address] URL: [email or web address] Date: 08/14/2023 Prepared by: Carlynn Herald  Exercises - Supine March  - 1 x daily - 7 x weekly - 3 sets - 10 reps - Supine Bridge with Resistance Band  - 1 x daily - 7 x weekly - 3 sets - 10 reps - Hooklying Single Leg Bent Knee Fallouts with Resistance  - 1 x daily - 7 x weekly - 3 sets - 10 reps - Sit to Stand  Without Arm Support  - 1 x daily - 7 x weekly - 3 sets - 10 reps - Standing Hip Extension with Counter Support  - 2 x daily - 7 x weekly - 1-2 sets - 10 reps - 3-5 sec  hold - Standing Hip Abduction with Counter Support  - 1 x daily - 7 x weekly - 2-3 sets - 10 reps - 2-3 sec  hold - Standing Knee Flexion Strengthening at Chair  - 2 x daily - 7 x weekly - 1 sets - 10 reps - 2-3 sec  hold - Standing Hip Flexion  - 2 x daily - 7 x weekly - 1 sets - 10 reps - 2-3 sec  hold - Mini Squat with Counter Support  - 1 x daily - 7 x weekly - 3 sets - 10 reps - Backward Walking with Counter Support  - 1 x daily - 7 x weekly - 3 sets - 10 reps - Side Stepping with Counter Support  - 1 x daily - 7 x weekly - 3 sets - 10 reps  ASSESSMENT:  CLINICAL IMPRESSION: Multi-directional walking continued to progress dynamic balance and stability. Patient demonstrates improved stride length with gait, however continues to feel more steady with [facility] for longer distances.    OBJECTIVE IMPAIRMENTS: Abnormal  gait, decreased activity tolerance, decreased balance, decreased endurance, decreased mobility, difficulty walking, decreased ROM, decreased strength, impaired flexibility, improper body mechanics, postural dysfunction, and pain.   ACTIVITY LIMITATIONS: carrying, lifting, bending, standing, squatting, sleeping, stairs, transfers, bed mobility, and locomotion level  PARTICIPATION LIMITATIONS: meal prep, cleaning, laundry, driving, shopping, and community activity  PERSONAL FACTORS: Past/current experiences, Time since onset of injury/illness/exacerbation, and 1-2 comorbidities: COPD; emphysema; sedentary activity level  are also affecting patient's functional outcome.   REHAB POTENTIAL: Good  CLINICAL DECISION MAKING: Evolving/moderate complexity  EVALUATION COMPLEXITY: Moderate   GOALS: Goals reviewed with patient? Yes  SHORT TERM GOALS: Target date: 08/16/2023  Independent in initial HEP  Baseline: Goal status: MET  2.  Independent in basic transfers sit <-> stand; bed mobility Baseline:  Goal status: MET  3.  Ambulation with single point cane with safe, functional gait for in the home Baseline:  Goal status: MET   LONG TERM GOALS: Target date: 09/13/2023  Independent in all transfers and transitional activities  Baseline:  Goal status: INITIAL  2.  4+/5 to 5/5 strength R LE  Baseline:  Goal status: INITIAL  3.  Independent gait without assistive device for community distances  Baseline:  Goal status: INITIAL  4.  Return to community based exercise such as silver sneakers 2 days/week  Baseline:  Goal status: INITIAL  5.  Independent in HEP  Baseline:  Goal status: INITIAL  6.  Improve LEFS by 20% or greater  Baseline: LEFS 24/80; 30%; 08/14/23: 50/80 = 62.5% Goal status: INITIAL   PLAN:  PT FREQUENCY: 2x/week  PT DURATION: 8 weeks  PLANNED INTERVENTIONS: 97110-Therapeutic exercises, 97530- Therapeutic activity, 97112- Neuromuscular re-education, 97535-  Self Care, 40981- Manual therapy, [phone]- Gait training, [phone]- Aquatic Therapy, [phone]- Ionotophoresis 4mg /ml Dexamethasone, Patient/Family education, Balance training, Stair training, Taping, Dry Needling, Cryotherapy, and Moist heat  PLAN FOR NEXT SESSION: Progress hip strengthening and balance exercises as tolerated  Sanjuana Mae, PTA 08/14/2023, 3:31 PM

## 2023-08-15 DIAGNOSIS — G4733 Obstructive sleep apnea (adult) (pediatric): Secondary | ICD-10-CM | POA: Diagnosis not present

## 2023-08-16 ENCOUNTER — Encounter: Payer: Self-pay | Admitting: Rehabilitative and Restorative Service Providers"

## 2023-08-16 ENCOUNTER — Ambulatory Visit: Admitting: Rehabilitative and Restorative Service Providers"

## 2023-08-16 DIAGNOSIS — R252 Cramp and spasm: Secondary | ICD-10-CM | POA: Diagnosis not present

## 2023-08-16 DIAGNOSIS — M6281 Muscle weakness (generalized): Secondary | ICD-10-CM | POA: Diagnosis not present

## 2023-08-16 DIAGNOSIS — R29898 Other symptoms and signs involving the musculoskeletal system: Secondary | ICD-10-CM | POA: Diagnosis not present

## 2023-08-16 DIAGNOSIS — M25551 Pain in right hip: Secondary | ICD-10-CM

## 2023-08-16 DIAGNOSIS — M25651 Stiffness of right hip, not elsewhere classified: Secondary | ICD-10-CM

## 2023-08-16 NOTE — Therapy (Signed)
 OUTPATIENT PHYSICAL THERAPY LOWER EXTREMITY TREATMENT   Patient Name: [name] [name] MRN: [id number] DOB:12-Jul-1940, 83 y.o., female Today's Date: 08/16/2023  END OF SESSION:  PT End of Session - 08/16/23 1451     Visit Number 8    Number of Visits 16    Date for PT Re-Evaluation 09/13/23    Authorization Type [employer] MCR    Authorization Time Period 16 VISITS APPROVED FOR PT 07/19/2023-09/13/2023    Authorization - Visit Number 6    Authorization - Number of Visits 16    Progress Note Due on Visit 10    PT Start Time 1450    PT Stop Time 1530    PT Time Calculation (min) 40 min    Activity Tolerance Patient tolerated treatment well            Past Medical History:  Diagnosis Date   Anxiety    intermittent   Arthritis    blood in stool    CKD (chronic kidney disease), stage III (HCC)    COPD (chronic obstructive pulmonary disease) (HCC)    Diverticulosis    Emphysema lung (HCC)    Fibromyalgia    GERD (gastroesophageal reflux disease)    Glaucoma    Hemorrhoids    HSV-2 (herpes simplex virus 2) infection 2009   Hypothyroidism    OSA on CPAP 07/27/2006   Osteoporosis    Rectal bleeding    with hemorrhoids   Past Surgical History:  Procedure Laterality Date   BUNIONECTOMY  [id number]   CARPAL TUNNEL RELEASE Right 12/08/2016   Procedure: CARPAL TUNNEL RELEASE;  Surgeon: Cindee Salt, MD;  Location: [facility];  Service: Orthopedics;  Laterality: Right;  REG/FAB   CARPAL TUNNEL RELEASE Left 07/02/2021   Procedure: CARPAL TUNNEL RELEASE LEFT;  Surgeon: Cindee Salt, MD;  Location: [facility];  Service: Orthopedics;  Laterality: Left;  45 MIN   CERVICAL FUSION  2008   EYE SURGERY     cataract and glaucoma   hemorrhoids removed     MENISCUS REPAIR Left    TOTAL HIP ARTHROPLASTY Right 07/03/2023   Procedure: TOTAL HIP ARTHROPLASTY ANTERIOR APPROACH;  Surgeon: Jodi Geralds, MD;  Location: [facility];  Service: Orthopedics;  Laterality:  Right;   TRIGGER FINGER RELEASE  2008   Patient Active Problem List   Diagnosis Date Noted   Pre-operative clearance 05/26/2023   Primary osteoarthritis of left knee 02/11/2020   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   Right arm pain 03/07/2019   Diverticulosis 03/06/2019   CKD (chronic kidney disease), stage III (HCC) 09/06/2018   Allergic rhinitis 08/13/2018   Hypothyroid 02/05/2018   Osteopenia with high risk of fracture 04/26/2017   Bilateral tinnitus 04/25/2017   Restrictive lung disease 04/07/2017   Centrilobular emphysema (HCC) 01/19/2017   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   Trigger ring finger of right hand 08/12/2016   Genital herpes 02/16/2016   Primary osteoarthritis of right hip 07/27/2015   ASCVD (arteriosclerotic cardiovascular disease) 02/05/2015   Rectal bleeding 02/05/2015   Pulmonary nodules 02/05/2015   Glaucoma 01/06/2015   Fibromyalgia 01/06/2015   Elevated cholesterol with elevated triglycerides 01/06/2015   Obstructive sleep apnea on CPAP 01/06/2015   Osteoporosis 01/06/2015    PCP: Dr Nani Gasser  REFERRING PROVIDER: Dr Jodi Geralds  REFERRING DIAG: R hip pain s/p R THA  THERAPY DIAG:  Stiffness of right hip, not elsewhere classified  Muscle weakness (generalized)  Other symptoms and signs involving the musculoskeletal  system  Pain in right hip  Cramp and spasm  Rationale for Evaluation and Treatment: Rehabilitation  ONSET DATE: 07/03/23 surgery date   SUBJECTIVE:   SUBJECTIVE STATEMENT: Patient reports her hip is feels better. The manual work through the front of the hip "helped a lot". Patient states she has less numbness but continues to feel aching and tightness in R hip but its getting better. Is walking a lot at home without cane and does feel safe. Sees Dr Luiz Blare tomorrow and hopes to go back to silver sneakers. Went back to sewing group at church today - hand sewing only, not yet using the foot peddle.     PERTINENT HISTORY: Hip pathology for > 5 years; COPD; emphysema; cervical disc surgery; arthritis knees and hands  PAIN:  Are you having pain? Yes: NPRS scale: 0-1/10 Pain location: R hip  Pain description: tenderness; soreness; aching  Aggravating factors: walking; moving around more  Relieving factors: rest   PRECAUTIONS: Anterior hip - no restrictions per pt per MD  WEIGHT BEARING RESTRICTIONS: No  FALLS:  Has patient fallen in last 6 months? No  LIVING ENVIRONMENT: Lives with: lives with their family and lives alone Lives in: House/apartment Stairs: No Has following equipment at home: Single point cane; Environmental consultant - 2 wheeled   OCCUPATION: retired; [employer]; household chores; silver sneakers; church work   PATIENT GOALS: walk without assistive device; return to silver sneakers and home exercise   NEXT MD VISIT: 08/17/23  OBJECTIVE:  Note: Objective measures were completed at Evaluation unless otherwise noted.  DIAGNOSTIC FINDINGS: 03/02/23 XR IMPRESSION: 1. Severe osteoarthritis of the right hip, substantially progressive from 07/22/2015. 2. Moderate osteoarthritis of the left hip. 3. L4-5 spondylosis.  PATIENT SURVEYS:  LEFS 24/80; 30%   SENSATION: WFL  EDEMA:  Minimal   MUSCLE LENGTH: Hamstrings: Right 55 deg; Left 70 deg Thomas test: Right 0 deg; Left 10 deg  POSTURE: rounded shoulders, forward head, flexed trunk , and weight shift left  PALPATION: Tender anterior hip Scar anterior hip   LOWER EXTREMITY ROM:  Active ROM Right eval Right 08/09/23  Hip flexion 90 110  Hip extension 0 5  Hip abduction 20 64  Hip adduction    Hip internal rotation    Hip external rotation    Knee flexion 120 132  Knee extension 0   Ankle dorsiflexion    Ankle plantarflexion    Ankle inversion    Ankle eversion     (Blank rows = not tested)  LOWER EXTREMITY MMT: R not tested resistively - moving well against gravity   L LE - WNL's    MMT Right eval  Left eval  Hip flexion 4   Hip extension 4   Hip abduction 4   Hip adduction    Hip internal rotation    Hip external rotation    Knee flexion    Knee extension    Ankle dorsiflexion    Ankle plantarflexion    Ankle inversion    Ankle eversion     (Blank rows = not tested)   FUNCTIONAL TESTS:  5 times sit to stand: 13.63 sec use of UEs on table  08/15/13: 5 times sit to stand 13.61 no use of UE's   GAIT: Distance walked: 40 Assistive device utilized: Environmental consultant - 2 wheeled Level of assistance: Complete Independence Comments: antalgic gait limp R LE   Distance walked: 40 Assistive device utilized: SPC or no assistive device  Level of assistance: Complete Independence Comments:  gait pattern is improving with less limp R LE in wt bearing R   [facility] Adult PT Treatment:                                                DATE: 08/16/2023 Therapeutic Exercise:  Supine: Alternate hip abduction in hooklying blue TB R/L x 15  Bridges + blue TB x 15  Core marching x 10  Therapeutic Activity: Sit to stand x 15 no UE support from slightly elevated treatment table  Mini squat x 15  Hip abduction standing x 10 x 2 R/L  SLS R/L 20 sec x 3  Side stepping 10 ft x 5 each R/L Backwards walking/heel to toe 10 ft x 5 each  Manual:   STM working through the anterior hip and thigh   Gentle scar massage/stretching  PT assist to stretch R hip flexors supine Thomas 30 sec x 1; 45 sec x 1  Gait: Gait training without assistive device around gym x 2    [facility] Adult PT Treatment:                                                DATE: 08/14/2023 Therapeutic Exercise: Supine (2 pillows): Core marching Bent knee fall out + blue TB (B) Bridges + black TB 2x10 S/L passive hip flexor stretch Supported butterfly stretch x 1 min Manual Therapy: Roller stick (R) quad/ITB Therapeutic Activity: Walking with SPC --> no AD Backwards walking --> focus on equal stride length Side stepping LEFS     [facility] Adult  PT Treatment:                                                DATE: 08/09/2023 Therapeutic Exercise: Supine: Alternate hip abduction in hooklying blue TB R/L x 10  Bridges + blue TB x 10  Core marching x 10  Therapeutic Activity: Sit to stand x 15 initially using UE's to assist; last 10 no UE support from slightly elevated treatment table  Mini squat x 10  SLS R/L 20 sec x 3  Side stepping 10 ft x 5 each R/L Backwards walking/heel to toe 10 ft x 5 each  Manual:   STM working through the anterior hip and thigh   Gentle scar massage/stretching  PT assist to stretch R hip flexors supine Thomas 30 sec x 1; 45 sec x 1  Gait: Gait training with [facility]    [facility] Adult PT Treatment:                                                DATE: 08/07/2023 Therapeutic Exercise: Standing: Heel raises x20 Hip abd 2x10 (B) Hip extension 2x10 (B) HS curls 2x10 (B) Mini squats x10 Standing marching + 2#AW Seated: Isometric hip add ball squeeze  Clamshells + GTB 2x10 Supine: Bent knee fall out + blue TB (R) Bridges + blue TB  Core marching Therapeutic Activity: Side stepping  Backwards walking Gait:  Gait training with no AD                                                                                                                               PATIENT EDUCATION:  Education details: Updated HEP  Person educated: Patient Education method: Explanation, Demonstration, Tactile cues, Verbal cues, and Handouts Education comprehension: verbalized understanding, returned demonstration, verbal cues required, tactile cues required, and needs further education  HOME EXERCISE PROGRAM: Access Code: [email or web address] URL: [email or web address] Date: 08/14/2023 Prepared by: Carlynn Herald  Exercises - Supine March  - 1 x daily - 7 x weekly - 3 sets - 10 reps - Supine Bridge with Resistance Band  - 1 x daily - 7 x weekly - 3 sets - 10 reps - Hooklying Single Leg Bent Knee Fallouts with Resistance  -  1 x daily - 7 x weekly - 3 sets - 10 reps - Sit to Stand Without Arm Support  - 1 x daily - 7 x weekly - 3 sets - 10 reps - Standing Hip Extension with Counter Support  - 2 x daily - 7 x weekly - 1-2 sets - 10 reps - 3-5 sec  hold - Standing Hip Abduction with Counter Support  - 1 x daily - 7 x weekly - 2-3 sets - 10 reps - 2-3 sec  hold - Standing Knee Flexion Strengthening at Chair  - 2 x daily - 7 x weekly - 1 sets - 10 reps - 2-3 sec  hold - Standing Hip Flexion  - 2 x daily - 7 x weekly - 1 sets - 10 reps - 2-3 sec  hold - Mini Squat with Counter Support  - 1 x daily - 7 x weekly - 3 sets - 10 reps - Backward Walking with Counter Support  - 1 x daily - 7 x weekly - 3 sets - 10 reps - Side Stepping with Counter Support  - 1 x daily - 7 x weekly - 3 sets - 10 reps  ASSESSMENT:  CLINICAL IMPRESSION: Patient reports decreased pain and tightness in the R hip and thigh. She is gaining strength and stability with transfers and gait. She is progressing well with gait - ambulating without assistive device in her home. She is using the [facility] for community distances. Loucinda is progressing well toward stated goals of therapy. She will benefit from continued treatment to achieve maximum rehab potential and work toward independence in exercise and gait.    OBJECTIVE IMPAIRMENTS: Abnormal gait, decreased activity tolerance, decreased balance, decreased endurance, decreased mobility, difficulty walking, decreased ROM, decreased strength, impaired flexibility, improper body mechanics, postural dysfunction, and pain.    GOALS: Goals reviewed with patient? Yes  SHORT TERM GOALS: Target date: 08/16/2023  Independent in initial HEP  Baseline: Goal status: MET  2.  Independent in basic transfers sit <-> stand; bed mobility Baseline:  Goal status: MET  3.  Ambulation with single point cane with safe,  functional gait for in the home Baseline:  Goal status: MET   LONG TERM GOALS: Target date:  09/13/2023  Independent in all transfers and transitional activities  Baseline:  Goal status: met   2.  4+/5 to 5/5 strength R LE  Baseline:  Goal status: on going   3.  Independent gait without assistive device for community distances  Baseline: rolling walker 08/16/23: SPC and without assistive device in home  Goal status: on going   4.  Return to community based exercise such as silver sneakers 2 days/week  Baseline:  Goal status: on going   5.  Independent in HEP  Baseline:  Goal status: on going   6.  Improve LEFS by 20% or greater  Baseline: LEFS 24/80; 30%; 08/14/23: 50/80 = 62.5% Goal status: INITIAL   PLAN:  PT FREQUENCY: 2x/week  PT DURATION: 8 weeks  PLANNED INTERVENTIONS: 97110-Therapeutic exercises, 97530- Therapeutic activity, 97112- Neuromuscular re-education, 97535- Self Care, 16109- Manual therapy, [phone]- Gait training, [phone]- Aquatic Therapy, [phone]- Ionotophoresis 4mg /ml Dexamethasone, Patient/Family education, Balance training, Stair training, Taping, Dry Needling, Cryotherapy, and Moist heat  PLAN FOR NEXT SESSION: Progress hip strengthening and balance exercises as tolerated  Xavius Spadafore Rober Minion, PT 08/16/2023, 2:51 PM

## 2023-08-21 ENCOUNTER — Ambulatory Visit

## 2023-08-21 DIAGNOSIS — M6281 Muscle weakness (generalized): Secondary | ICD-10-CM | POA: Diagnosis not present

## 2023-08-21 DIAGNOSIS — M25651 Stiffness of right hip, not elsewhere classified: Secondary | ICD-10-CM | POA: Diagnosis not present

## 2023-08-21 DIAGNOSIS — R252 Cramp and spasm: Secondary | ICD-10-CM | POA: Diagnosis not present

## 2023-08-21 DIAGNOSIS — R29898 Other symptoms and signs involving the musculoskeletal system: Secondary | ICD-10-CM | POA: Diagnosis not present

## 2023-08-21 DIAGNOSIS — M25551 Pain in right hip: Secondary | ICD-10-CM

## 2023-08-21 NOTE — Therapy (Signed)
 OUTPATIENT PHYSICAL THERAPY LOWER EXTREMITY TREATMENT   Patient Name: [name] [name] MRN: [id number] DOB:07-01-1940, 83 y.o., female Today's Date: 08/21/2023  END OF SESSION:  PT End of Session - 08/21/23 1450     Visit Number 9    Number of Visits 16    Date for PT Re-Evaluation 09/13/23    Authorization Type [employer] MCR    Authorization Time Period 16 VISITS APPROVED FOR PT 07/19/2023-09/13/2023    Authorization - Visit Number 9    Authorization - Number of Visits 16    Progress Note Due on Visit 10    PT Start Time 1449    PT Stop Time 1529    PT Time Calculation (min) 40 min    Activity Tolerance Patient tolerated treatment well    Behavior During Therapy WFL for tasks assessed/performed            Past Medical History:  Diagnosis Date   Anxiety    intermittent   Arthritis    blood in stool    CKD (chronic kidney disease), stage III (HCC)    COPD (chronic obstructive pulmonary disease) (HCC)    Diverticulosis    Emphysema lung (HCC)    Fibromyalgia    GERD (gastroesophageal reflux disease)    Glaucoma    Hemorrhoids    HSV-2 (herpes simplex virus 2) infection 2009   Hypothyroidism    OSA on CPAP 07/27/2006   Osteoporosis    Rectal bleeding    with hemorrhoids   Past Surgical History:  Procedure Laterality Date   BUNIONECTOMY  4098,1191   CARPAL TUNNEL RELEASE Right 12/08/2016   Procedure: CARPAL TUNNEL RELEASE;  Surgeon: Lyanne Sample, MD;  Location: [facility];  Service: Orthopedics;  Laterality: Right;  REG/FAB   CARPAL TUNNEL RELEASE Left 07/02/2021   Procedure: CARPAL TUNNEL RELEASE LEFT;  Surgeon: Lyanne Sample, MD;  Location: [facility];  Service: Orthopedics;  Laterality: Left;  45 MIN   CERVICAL FUSION  2008   EYE SURGERY     cataract and glaucoma   hemorrhoids removed     MENISCUS REPAIR Left    TOTAL HIP ARTHROPLASTY Right 07/03/2023   Procedure: TOTAL HIP ARTHROPLASTY ANTERIOR APPROACH;  Surgeon: Neil Balls,  MD;  Location: [facility];  Service: Orthopedics;  Laterality: Right;   TRIGGER FINGER RELEASE  2008   Patient Active Problem List   Diagnosis Date Noted   Pre-operative clearance 05/26/2023   Primary osteoarthritis of left knee 02/11/2020   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   Right arm pain 03/07/2019   Diverticulosis 03/06/2019   CKD (chronic kidney disease), stage III (HCC) 09/06/2018   Allergic rhinitis 08/13/2018   Hypothyroid 02/05/2018   Osteopenia with high risk of fracture 04/26/2017   Bilateral tinnitus 04/25/2017   Restrictive lung disease 04/07/2017   Centrilobular emphysema (HCC) 01/19/2017   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   Trigger ring finger of right hand 08/12/2016   Genital herpes 02/16/2016   Primary osteoarthritis of right hip 07/27/2015   ASCVD (arteriosclerotic cardiovascular disease) 02/05/2015   Rectal bleeding 02/05/2015   Pulmonary nodules 02/05/2015   Glaucoma 01/06/2015   Fibromyalgia 01/06/2015   Elevated cholesterol with elevated triglycerides 01/06/2015   Obstructive sleep apnea on CPAP 01/06/2015   Osteoporosis 01/06/2015    PCP: Dr Duaine German  REFERRING PROVIDER: Dr Neil Balls  REFERRING DIAG: R hip pain s/p R THA  THERAPY DIAG:  Stiffness of right hip, not elsewhere classified  Muscle  weakness (generalized)  Other symptoms and signs involving the musculoskeletal system  Pain in right hip  Cramp and spasm  Rationale for Evaluation and Treatment: Rehabilitation  ONSET DATE: 07/03/23 surgery date   SUBJECTIVE:   SUBJECTIVE STATEMENT: Patient reports her hip continues to feel better and stronger; states she received a good report from follow-up with Dr. Luiz Blare on hip progress.   PERTINENT HISTORY: Hip pathology for > 5 years; COPD; emphysema; cervical disc surgery; arthritis knees and hands  PAIN:  Are you having pain? Yes: NPRS scale: 0-1/10 Pain location: R hip  Pain description: tenderness;  soreness; aching  Aggravating factors: walking; moving around more  Relieving factors: rest   PRECAUTIONS: Anterior hip - no restrictions per pt per MD  WEIGHT BEARING RESTRICTIONS: No  FALLS:  Has patient fallen in last 6 months? No  LIVING ENVIRONMENT: Lives with: lives with their family and lives alone Lives in: House/apartment Stairs: No Has following equipment at home: Single point cane; Environmental consultant - 2 wheeled   OCCUPATION: retired; [employer]; household chores; silver sneakers; church work   PATIENT GOALS: walk without assistive device; return to silver sneakers and home exercise   NEXT MD VISIT: 08/17/23  OBJECTIVE:  Note: Objective measures were completed at Evaluation unless otherwise noted.  DIAGNOSTIC FINDINGS: 03/02/23 XR IMPRESSION: 1. Severe osteoarthritis of the right hip, substantially progressive from 07/22/2015. 2. Moderate osteoarthritis of the left hip. 3. L4-5 spondylosis.  PATIENT SURVEYS:  LEFS 24/80; 30%   SENSATION: WFL  EDEMA:  Minimal   MUSCLE LENGTH: Hamstrings: Right 55 deg; Left 70 deg Thomas test: Right 0 deg; Left 10 deg  POSTURE: rounded shoulders, forward head, flexed trunk , and weight shift left  PALPATION: Tender anterior hip Scar anterior hip   LOWER EXTREMITY ROM:  Active ROM Right eval Right 08/09/23  Hip flexion 90 110  Hip extension 0 5  Hip abduction 20 64  Hip adduction    Hip internal rotation    Hip external rotation    Knee flexion 120 132  Knee extension 0   Ankle dorsiflexion    Ankle plantarflexion    Ankle inversion    Ankle eversion     (Blank rows = not tested)  LOWER EXTREMITY MMT: R not tested resistively - moving well against gravity   L LE - WNL's    MMT Right eval Left eval  Hip flexion 4   Hip extension 4   Hip abduction 4   Hip adduction    Hip internal rotation    Hip external rotation    Knee flexion    Knee extension    Ankle dorsiflexion    Ankle plantarflexion    Ankle  inversion    Ankle eversion     (Blank rows = not tested)   FUNCTIONAL TESTS:  5 times sit to stand: 13.63 sec use of UEs on table  08/15/13: 5 times sit to stand 13.61 no use of UE's   GAIT: Distance walked: 40 Assistive device utilized: Environmental consultant - 2 wheeled Level of assistance: Complete Independence Comments: antalgic gait limp R LE   Distance walked: 40 Assistive device utilized: SPC or no assistive device  Level of assistance: Complete Independence Comments: gait pattern is improving with less limp R LE in wt bearing R   [facility] Adult PT Treatment:  DATE: 08/21/2023 Therapeutic Exercise: Collene Dawson + blue TB hip abd iso Resisted side step out/in + GTB crossed at ankles  Standing: Hip extension + YTB (R) 2x10 HS curls + 2#AW 2x10 Neuromuscular re-ed: Hooklying:  Hip add + orange ball squeeze Hip add isometric + opp knee extension Bilateral clamshells x15 --> bent knee fall out x15  Therapeutic Activity: Backwards walking --> stride length, lifting feet Standing squats + HHAx2 at counter --> no HHA, reaching out over counter Marching + 2#AW x 30" --> SOB    [facility] Adult PT Treatment:                                                DATE: 08/16/2023 Therapeutic Exercise: Supine: Alternate hip abduction in hooklying blue TB R/L x 15  Bridges + blue TB x 15  Core marching x 10  Therapeutic Activity: Sit to stand x 15 no UE support from slightly elevated treatment table  Mini squat x 15  Hip abduction standing x 10 x 2 R/L  SLS R/L 20 sec x 3  Side stepping 10 ft x 5 each R/L Backwards walking/heel to toe 10 ft x 5 each  Manual:   STM working through the anterior hip and thigh   Gentle scar massage/stretching  PT assist to stretch R hip flexors supine Thomas 30 sec x 1; 45 sec x 1  Gait: Gait training without assistive device around gym x 2    [facility] Adult PT Treatment:                                                DATE:  08/14/2023 Therapeutic Exercise: Supine (2 pillows): Core marching Bent knee fall out + blue TB (B) Bridges + black TB 2x10 S/L passive hip flexor stretch Supported butterfly stretch x 1 min Manual Therapy: Roller stick (R) quad/ITB Therapeutic Activity: Walking with SPC --> no AD Backwards walking --> focus on equal stride length Side stepping LEFS                                                                                                                               PATIENT EDUCATION:  Education details: Updated HEP  Person educated: Patient Education method: Explanation, Demonstration, Tactile cues, Verbal cues, and Handouts Education comprehension: verbalized understanding, returned demonstration, verbal cues required, tactile cues required, and needs further education  HOME EXERCISE PROGRAM: Access Code: [email or web address] URL: [email or web address] Date: 08/21/2023 Prepared by: Sims Duck  Exercises - Supine Bridge with Resistance Band  - 1 x daily - 7 x weekly - 3 sets - 10 reps - Hooklying Single Leg Bent Knee Fallouts with Resistance  - 1 x daily -  7 x weekly - 3 sets - 10 reps - Sit to Stand Without Arm Support  - 1 x daily - 7 x weekly - 3 sets - 10 reps - Standing Hip Extension with Counter Support  - 2 x daily - 7 x weekly - 1-2 sets - 10 reps - 3-5 sec  hold - Standing Hip Abduction with Counter Support  - 1 x daily - 7 x weekly - 2-3 sets - 10 reps - 2-3 sec  hold - Standing Knee Flexion Strengthening at Chair  - 2 x daily - 7 x weekly - 1 sets - 10 reps - 2-3 sec  hold - Standing Hip Flexion  - 2 x daily - 7 x weekly - 1 sets - 10 reps - 2-3 sec  hold - Backward Walking with Counter Support  - 1 x daily - 7 x weekly - 3 sets - 10 reps - Side Stepping with Counter Support  - 1 x daily - 7 x weekly - 3 sets - 10 reps - Squat with Counter Support  - 1 x daily - 7 x weekly - 3 sets - 10 reps  ASSESSMENT:  CLINICAL IMPRESSION: Noted decreased stride  length and poor foot clearance during backwards walking; walking next to counter for hand hold support as needed improved patient's stability and confidence with dynamic balance. Standing squats progressed with greater range of motion and decreasing UE support.  OBJECTIVE IMPAIRMENTS: Abnormal gait, decreased activity tolerance, decreased balance, decreased endurance, decreased mobility, difficulty walking, decreased ROM, decreased strength, impaired flexibility, improper body mechanics, postural dysfunction, and pain.    GOALS: Goals reviewed with patient? Yes  SHORT TERM GOALS: Target date: 08/16/2023  Independent in initial HEP  Baseline: Goal status: MET  2.  Independent in basic transfers sit <-> stand; bed mobility Baseline:  Goal status: MET  3.  Ambulation with single point cane with safe, functional gait for in the home Baseline:  Goal status: MET   LONG TERM GOALS: Target date: 09/13/2023  Independent in all transfers and transitional activities  Baseline:  Goal status: met   2.  4+/5 to 5/5 strength R LE  Baseline:  Goal status: on going   3.  Independent gait without assistive device for community distances  Baseline: rolling walker 08/16/23: SPC and without assistive device in home  Goal status: on going   4.  Return to community based exercise such as silver sneakers 2 days/week  Baseline:  Goal status: on going   5.  Independent in HEP  Baseline:  Goal status: on going   6.  Improve LEFS by 20% or greater  Baseline: LEFS 24/80; 30%; 08/14/23: 50/80 = 62.5% Goal status: INITIAL   PLAN:  PT FREQUENCY: 2x/week  PT DURATION: 8 weeks  PLANNED INTERVENTIONS: 97110-Therapeutic exercises, 97530- Therapeutic activity, 97112- Neuromuscular re-education, 97535- Self Care, 62130- Manual therapy, [phone]- Gait training, [phone]- Aquatic Therapy, [phone]- Ionotophoresis 4mg /ml Dexamethasone, Patient/Family education, Balance training, Stair training, Taping, Dry Needling,  Cryotherapy, and Moist heat  PLAN FOR NEXT SESSION: Progress hip strengthening and balance exercises as tolerated  Flint Hummer, PTA 08/21/2023, 3:35 PM

## 2023-08-22 ENCOUNTER — Encounter: Payer: Self-pay | Admitting: Family Medicine

## 2023-08-22 ENCOUNTER — Ambulatory Visit: Payer: Medicare PPO | Admitting: Family Medicine

## 2023-08-22 VITALS — BP 131/76 | HR 84 | Ht 62.0 in | Wt 146.0 lb

## 2023-08-22 DIAGNOSIS — E039 Hypothyroidism, unspecified: Secondary | ICD-10-CM | POA: Diagnosis not present

## 2023-08-22 DIAGNOSIS — J309 Allergic rhinitis, unspecified: Secondary | ICD-10-CM | POA: Diagnosis not present

## 2023-08-22 DIAGNOSIS — E782 Mixed hyperlipidemia: Secondary | ICD-10-CM

## 2023-08-22 DIAGNOSIS — J432 Centrilobular emphysema: Secondary | ICD-10-CM

## 2023-08-22 NOTE — Assessment & Plan Note (Signed)
 Recommend Flonase as-start with 2 sprays in each nostril daily.  Make sure to tilt your chin down when you squirted in.  After 1 week you can decrease to 1 spray in each nostril daily.  If after 2 weeks you are not noticing any improvement then let us  know.

## 2023-08-22 NOTE — Patient Instructions (Signed)
 Recommend Flonase as-start with 2 sprays in each nostril daily.  Make sure to tilt your chin down when you squirted in.  After 1 week you can decrease to 1 spray in each nostril daily.  If after 2 weeks you are not noticing any improvement then let us  know.

## 2023-08-22 NOTE — Progress Notes (Signed)
   Established Patient Office Visit  Subjective  Patient ID: [name] [name], female    DOB: Oct 22, 1940  Age: 83 y.o. MRN: [id number]  Chief Complaint  Patient presents with   COPD   Hypothyroidism    HPI   F/U COPD -no recent breathing issues she actually feels like she has been doing well.  She has been dealing with some allergies that she says most spring and summer she has had to use a nasal spray in the past to help with congestion and postnasal drip and drainage.  She says last year she actually never had to take it.  She cannot remember the name of it she is just been using Chloraseptic spray as needed for the drainage and irritation in her throat.  Hypothyroidism - Taking medication regularly in the AM away from food and vitamins, etc. No recent change to skin, hair, or energy levels.  Fasting for labs.      ROS    Objective:     BP 131/76   Pulse 84   Ht 5\' 2"  (1.575 m)   Wt 146 lb (66.2 kg)   SpO2 97%   BMI 26.70 kg/m    Physical Exam Vitals and nursing note reviewed.  Constitutional:      Appearance: Normal appearance.  HENT:     Head: Normocephalic and atraumatic.  Eyes:     Conjunctiva/sclera: Conjunctivae normal.  Cardiovascular:     Rate and Rhythm: Normal rate and regular rhythm.  Pulmonary:     Effort: Pulmonary effort is normal.     Breath sounds: Normal breath sounds.  Skin:    General: Skin is warm and dry.  Neurological:     Mental Status: She is alert.  Psychiatric:        Mood and Affect: Mood normal.     No results found for any visits on 08/22/23.    The ASCVD Risk score (Arnett DK, et al., 2019) failed to calculate for the following reasons:   The 2019 ASCVD risk score is only valid for ages 37 to 40    Assessment & Plan:   Problem List Items Addressed This Visit       Respiratory   Centrilobular emphysema (HCC) - Primary   Stable on Stiolto and as needed albuterol no recent flares or exacerbations.       Relevant Orders   TSH   CMP14+EGFR   Lipid panel   Allergic rhinitis   Recommend Flonase as-start with 2 sprays in each nostril daily.  Make sure to tilt your chin down when you squirted in.  After 1 week you can decrease to 1 spray in each nostril daily.  If after 2 weeks you are not noticing any improvement then let us know.        Endocrine   Hypothyroid   Check TSH.  She is asymptomatic currently.      Relevant Orders   TSH   CMP14+EGFR   Lipid panel     Other   Elevated cholesterol with elevated triglycerides (Chronic)   To recheck lipid panel will call the results once available.      Relevant Orders   TSH   CMP14+EGFR   Lipid panel    Return in about 6 months (around 02/21/2024) for THYROID.    Nani Gasser, MD

## 2023-08-22 NOTE — Assessment & Plan Note (Signed)
 Check TSH.  She is asymptomatic currently.

## 2023-08-22 NOTE — Assessment & Plan Note (Signed)
 To recheck lipid panel will call the results once available.

## 2023-08-22 NOTE — Assessment & Plan Note (Signed)
 Stable on Stiolto and as needed albuterol no recent flares or exacerbations.

## 2023-08-23 ENCOUNTER — Ambulatory Visit: Admitting: Rehabilitative and Restorative Service Providers"

## 2023-08-23 ENCOUNTER — Encounter: Payer: Self-pay | Admitting: Rehabilitative and Restorative Service Providers"

## 2023-08-23 ENCOUNTER — Encounter: Payer: Self-pay | Admitting: Family Medicine

## 2023-08-23 DIAGNOSIS — M25651 Stiffness of right hip, not elsewhere classified: Secondary | ICD-10-CM

## 2023-08-23 DIAGNOSIS — M25551 Pain in right hip: Secondary | ICD-10-CM | POA: Diagnosis not present

## 2023-08-23 DIAGNOSIS — R252 Cramp and spasm: Secondary | ICD-10-CM | POA: Diagnosis not present

## 2023-08-23 DIAGNOSIS — R29898 Other symptoms and signs involving the musculoskeletal system: Secondary | ICD-10-CM

## 2023-08-23 DIAGNOSIS — M6281 Muscle weakness (generalized): Secondary | ICD-10-CM | POA: Diagnosis not present

## 2023-08-23 LAB — CMP14+EGFR
ALT: 12 IU/L (ref 0–32)
AST: 18 IU/L (ref 0–40)
Albumin: 4.2 g/dL (ref 3.7–4.7)
Alkaline Phosphatase: 120 IU/L (ref 44–121)
BUN/Creatinine Ratio: 22 (ref 12–28)
BUN: 15 mg/dL (ref 8–27)
Bilirubin Total: 0.9 mg/dL (ref 0.0–1.2)
CO2: 24 mmol/L (ref 20–29)
Calcium: 9.6 mg/dL (ref 8.7–10.3)
Chloride: 106 mmol/L (ref 96–106)
Creatinine, Ser: 0.69 mg/dL (ref 0.57–1.00)
Globulin, Total: 2.6 g/dL (ref 1.5–4.5)
Glucose: 99 mg/dL (ref 70–99)
Potassium: 4.4 mmol/L (ref 3.5–5.2)
Sodium: 144 mmol/L (ref 134–144)
Total Protein: 6.8 g/dL (ref 6.0–8.5)
eGFR: 86 mL/min/{1.73_m2} (ref >59)

## 2023-08-23 LAB — LIPID PANEL
Chol/HDL Ratio: 2.6 ratio (ref 0.0–4.4)
Cholesterol, Total: 154 mg/dL (ref 100–199)
HDL: 60 mg/dL (ref >39)
LDL Chol Calc (NIH): 75 mg/dL (ref 0–99)
Triglycerides: 102 mg/dL (ref 0–149)
VLDL Cholesterol Cal: 19 mg/dL (ref 5–40)

## 2023-08-23 LAB — TSH: TSH: 2.99 u[IU]/mL (ref 0.450–4.500)

## 2023-08-23 NOTE — Therapy (Signed)
 OUTPATIENT PHYSICAL THERAPY LOWER EXTREMITY TREATMENT Medicare 10th visit Progress Note Reporting Period 07/19/23 to 08/1623  See note below for Objective Data and Assessment of Progress/Goals.       Patient Name: [name] [name] MRN: [id number] DOB:24-Aug-1940, 83 y.o., female Today's Date: 08/23/2023  END OF SESSION:  PT End of Session - 08/23/23 1447     Visit Number 10    Number of Visits 16    Date for PT Re-Evaluation 09/13/23    Authorization Type HUMA[employer] MCR    Authorization Time Period 16 VISITS APPROVED FOR PT 07/19/2023-09/13/2023    Authorization - Visit Number 10    Authorization - Number of Visits 16    Progress Note Due on Visit 10    PT Start Time 1445    PT Stop Time 1530    PT Time Calculation (min) 45 min    Activity Tolerance Patient tolerated treatment well            Past Medical History:  Diagnosis Date   Anxiety    intermittent   Arthritis    blood in stool    CKD (chronic kidney disease), stage III (HCC)    COPD (chronic obstructive pulmonary disease) (HCC)    Diverticulosis    Emphysema lung (HCC)    Fibromyalgia    GERD (gastroesophageal reflux disease)    Glaucoma    Hemorrhoids    HSV-2 (herpes simplex virus 2) infection 2009   Hypothyroidism    OSA on CPAP 07/27/2006   Osteoporosis    Rectal bleeding    with hemorrhoids   Past Surgical History:  Procedure Laterality Date   BUNIONECTOMY  [id number]   CARPAL TUNNEL RELEASE Right 12/08/2016   Procedure: CARPAL TUNNEL RELEASE;  Surgeon: Cindee Salt, MD;  Location: [facility];  Service: Orthopedics;  Laterality: Right;  REG/FAB   CARPAL TUNNEL RELEASE Left 07/02/2021   Procedure: CARPAL TUNNEL RELEASE LEFT;  Surgeon: Cindee Salt, MD;  Location: [facility];  Service: Orthopedics;  Laterality: Left;  45 MIN   CERVICAL FUSION  2008   EYE SURGERY     cataract and glaucoma   hemorrhoids removed     MENISCUS REPAIR Left    TOTAL HIP ARTHROPLASTY Right  07/03/2023   Procedure: TOTAL HIP ARTHROPLASTY ANTERIOR APPROACH;  Surgeon: Jodi Geralds, MD;  Location: [facility];  Service: Orthopedics;  Laterality: Right;   TRIGGER FINGER RELEASE  2008   Patient Active Problem List   Diagnosis Date Noted   Pre-operative clearance 05/26/2023   Primary osteoarthritis of left knee 02/11/2020   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   Right arm pain 03/07/2019   Diverticulosis 03/06/2019   CKD (chronic kidney disease), stage III (HCC) 09/06/2018   Allergic rhinitis 08/13/2018   Hypothyroid 02/05/2018   Osteopenia with high risk of fracture 04/26/2017   Bilateral tinnitus 04/25/2017   Restrictive lung disease 04/07/2017   Centrilobular emphysema (HCC) 01/19/2017   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   Trigger ring finger of right hand 08/12/2016   Genital herpes 02/16/2016   Primary osteoarthritis of right hip 07/27/2015   ASCVD (arteriosclerotic cardiovascular disease) 02/05/2015   Rectal bleeding 02/05/2015   Pulmonary nodules 02/05/2015   Glaucoma 01/06/2015   Fibromyalgia 01/06/2015   Elevated cholesterol with elevated triglycerides 01/06/2015   Obstructive sleep apnea on CPAP 01/06/2015   Osteoporosis 01/06/2015    PCP: Dr Nani Gasser  REFERRING PROVIDER: Dr Jodi Geralds  REFERRING DIAG: R hip pain s/p  R THA  THERAPY DIAG:  Stiffness of right hip, not elsewhere classified  Muscle weakness (generalized)  Other symptoms and signs involving the musculoskeletal system  Pain in right hip  Cramp and spasm  Rationale for Evaluation and Treatment: Rehabilitation  ONSET DATE: 07/03/23 surgery date   SUBJECTIVE:   SUBJECTIVE STATEMENT: Patient reports her hip continues to feel better and stronger. Agrees that she needs to work on bending down to be able to get to the dog bowls and pick things up from the floor.    PERTINENT HISTORY: Hip pathology for > 5 years; COPD; emphysema; cervical disc surgery; arthritis knees  and hands  PAIN:  Are you having pain? Yes: NPRS scale: 0/10 Pain location: R hip  Pain description: tenderness; soreness; aching  Aggravating factors: walking; moving around more  Relieving factors: rest   PRECAUTIONS: Anterior hip - no restrictions per pt per MD  WEIGHT BEARING RESTRICTIONS: No  FALLS:  Has patient fallen in last 6 months? No  LIVING ENVIRONMENT: Lives with: lives with their family and lives alone Lives in: House/apartment Stairs: No Has following equipment at home: Single point cane; Environmental consultant - 2 wheeled   OCCUPATION: retired; [employer]; household chores; silver sneakers; church work   PATIENT GOALS: walk without assistive device; return to silver sneakers and home exercise   NEXT MD VISIT: 08/17/23  OBJECTIVE:  Note: Objective measures were completed at Evaluation unless otherwise noted.  DIAGNOSTIC FINDINGS: 03/02/23 XR IMPRESSION: 1. Severe osteoarthritis of the right hip, substantially progressive from 07/22/2015. 2. Moderate osteoarthritis of the left hip. 3. L4-5 spondylosis.  PATIENT SURVEYS:  LEFS 24/80; 30%   SENSATION: WFL  EDEMA:  Minimal   MUSCLE LENGTH: Hamstrings: Right 55 deg; Left 70 deg Thomas test: Right 0 deg; Left 10 deg  POSTURE: rounded shoulders, forward head, flexed trunk , and weight shift left  PALPATION: Tender anterior hip Scar anterior hip   LOWER EXTREMITY ROM:  Active ROM Right eval Right 08/09/23 Right  08/23/23  Hip flexion 90 110 111  Hip extension 0 5 5  Hip abduction 20 64 64  Hip adduction     Hip internal rotation     Hip external rotation     Knee flexion 120 132 132  Knee extension 0  0  Ankle dorsiflexion     Ankle plantarflexion     Ankle inversion     Ankle eversion      (Blank rows = not tested)  LOWER EXTREMITY MMT: R not tested resistively - moving well against gravity   L LE - WNL's    MMT Right eval Right  08/23/23  Hip flexion 4 5  Hip extension 4   Hip abduction 4 5  Hip  adduction    Hip internal rotation    Hip external rotation    Knee flexion    Knee extension    Ankle dorsiflexion    Ankle plantarflexion    Ankle inversion    Ankle eversion     (Blank rows = not tested)   FUNCTIONAL TESTS:  5 times sit to stand: 13.63 sec use of UEs on table  08/15/13: 5 times sit to stand 13.61 no use of UE's   GAIT: Distance walked: 40 Assistive device utilized: Environmental consultant - 2 wheeled Level of assistance: Complete Independence Comments: antalgic gait limp R LE   Distance walked: 40 Assistive device utilized: SPC or no assistive device  Level of assistance: Complete Independence Comments: gait pattern is improving with  less limp R LE in wt bearing R    [facility] Adult PT Treatment:                                                DATE: 08/23/2023 Therapeutic Exercise: Nustep L 6 x 5 min  Sidelying: R hip abductin leading with heel x 15 Therapeutic Activity: Sit to stand x 10 x 2 no UE support from slightly elevated treatment table -  slow eccentric stand to sit  Hip abduction standing x 10 x 2 R/L  SLS R/L 20 sec x 3  Manual:   STM working through the anterior hip and thigh   Scar massage/stretching  PT assist to stretch R hip flexors supine Thomas 30 sec x 1; 45 sec x 1  Gait: Treadmill at 1.0 mph x 3 min  Gait training without assistive device around gym x 2   [facility] Adult PT Treatment:                                                DATE: 08/21/2023 Therapeutic Exercise: Collene Dawson + blue TB hip abd iso Resisted side step out/in + GTB crossed at ankles  Standing: Hip extension + YTB (R) 2x10 HS curls + 2#AW 2x10 Neuromuscular re-ed: Hooklying:  Hip add + orange ball squeeze Hip add isometric + opp knee extension Bilateral clamshells x15 --> bent knee fall out x15  Therapeutic Activity: Backwards walking --> stride length, lifting feet Standing squats + HHAx2 at counter --> no HHA, reaching out over counter Marching + 2#AW x 30" --> SOB    [facility] Adult  PT Treatment:                                                DATE: 08/16/2023 Therapeutic Exercise: Supine: Alternate hip abduction in hooklying blue TB R/L x 15  Bridges + blue TB x 15  Core marching x 10  Therapeutic Activity: Sit to stand x 15 no UE support from slightly elevated treatment table  Mini squat x 15  Hip abduction standing x 10 x 2 R/L  SLS R/L 20 sec x 3  Side stepping 10 ft x 5 each R/L Backwards walking/heel to toe 10 ft x 5 each  Manual:   STM working through the anterior hip and thigh   Gentle scar massage/stretching  PT assist to stretch R hip flexors supine Thomas 30 sec x 1; 45 sec x 1  Gait: Gait training without assistive device around gym x 2    [facility] Adult PT Treatment:                                                DATE: 08/14/2023 Therapeutic Exercise: Supine (2 pillows): Core marching Bent knee fall out + blue TB (B) Bridges + black TB 2x10 S/L passive hip flexor stretch Supported butterfly stretch x 1 min Manual Therapy: Roller stick (R) quad/ITB Therapeutic Activity: Walking with SPC --> no  AD Backwards walking --> focus on equal stride length Side stepping LEFS                                                                                                                               PATIENT EDUCATION:  Education details: Updated HEP  Person educated: Patient Education method: Explanation, Demonstration, Tactile cues, Verbal cues, and Handouts Education comprehension: verbalized understanding, returned demonstration, verbal cues required, tactile cues required, and needs further education  HOME EXERCISE PROGRAM: Access Code: [email or web address] URL: [email or web address] Date: 08/21/2023 Prepared by: Carlynn Herald  Exercises - Supine Bridge with Resistance Band  - 1 x daily - 7 x weekly - 3 sets - 10 reps - Hooklying Single Leg Bent Knee Fallouts with Resistance  - 1 x daily - 7 x weekly - 3 sets - 10 reps - Sit to Stand Without  Arm Support  - 1 x daily - 7 x weekly - 3 sets - 10 reps - Standing Hip Extension with Counter Support  - 2 x daily - 7 x weekly - 1-2 sets - 10 reps - 3-5 sec  hold - Standing Hip Abduction with Counter Support  - 1 x daily - 7 x weekly - 2-3 sets - 10 reps - 2-3 sec  hold - Standing Knee Flexion Strengthening at Chair  - 2 x daily - 7 x weekly - 1 sets - 10 reps - 2-3 sec  hold - Standing Hip Flexion  - 2 x daily - 7 x weekly - 1 sets - 10 reps - 2-3 sec  hold - Backward Walking with Counter Support  - 1 x daily - 7 x weekly - 3 sets - 10 reps - Side Stepping with Counter Support  - 1 x daily - 7 x weekly - 3 sets - 10 reps - Squat with Counter Support  - 1 x daily - 7 x weekly - 3 sets - 10 reps  ASSESSMENT:  CLINICAL IMPRESSION: Patient continues to demonstrate improvement with increasing hip mobility and strength. She is ambulating in her home without assistive device and working on gait pattern without assistive device in the clinic. Patient is progressing well toward stated goals of therapy.    OBJECTIVE IMPAIRMENTS: Abnormal gait, decreased activity tolerance, decreased balance, decreased endurance, decreased mobility, difficulty walking, decreased ROM, decreased strength, impaired flexibility, improper body mechanics, postural dysfunction, and pain.    GOALS: Goals reviewed with patient? Yes  SHORT TERM GOALS: Target date: 08/16/2023  Independent in initial HEP  Baseline: Goal status: MET  2.  Independent in basic transfers sit <-> stand; bed mobility Baseline:  Goal status: MET  3.  Ambulation with single point cane with safe, functional gait for in the home Baseline:  Goal status: MET   LONG TERM GOALS: Target date: 09/13/2023  Independent in all transfers and transitional activities  Baseline:  Goal status: met   2.  4+/5 to 5/5 strength R LE  Baseline:  Goal status: met   3.  Independent gait without assistive device for community distances  Baseline: rolling  walker 08/16/23: SPC and without assistive device in home  Goal status: on going   4.  Return to community based exercise such as silver sneakers 2 days/week  Baseline:  Goal status: on going   5.  Independent in HEP  Baseline:  Goal status: on going   6.  Improve LEFS by 20% or greater  Baseline: LEFS 24/80; 30%; 08/14/23: 50/80 = 62.5% Goal status: met   PLAN:  PT FREQUENCY: 2x/week  PT DURATION: 8 weeks  PLANNED INTERVENTIONS: 97110-Therapeutic exercises, 97530- Therapeutic activity, 97112- Neuromuscular re-education, 97535- Self Care, 16109- Manual therapy, [phone]- Gait training, [phone]- Aquatic Therapy, [phone]- Ionotophoresis 4mg /ml Dexamethasone, Patient/Family education, Balance training, Stair training, Taping, Dry Needling, Cryotherapy, and Moist heat  PLAN FOR NEXT SESSION: Progress hip strengthening and balance exercises as tolerated  Dimitri Dsouza Hadley Leu, PT 08/23/2023, 2:49 PM

## 2023-08-23 NOTE — Progress Notes (Signed)
 Your lab work is within acceptable range and there are no concerning findings.   ?

## 2023-08-24 DIAGNOSIS — D225 Melanocytic nevi of trunk: Secondary | ICD-10-CM | POA: Diagnosis not present

## 2023-08-24 DIAGNOSIS — L821 Other seborrheic keratosis: Secondary | ICD-10-CM | POA: Diagnosis not present

## 2023-08-24 DIAGNOSIS — D2272 Melanocytic nevi of left lower limb, including hip: Secondary | ICD-10-CM | POA: Diagnosis not present

## 2023-08-24 DIAGNOSIS — D2261 Melanocytic nevi of right upper limb, including shoulder: Secondary | ICD-10-CM | POA: Diagnosis not present

## 2023-08-24 DIAGNOSIS — D2271 Melanocytic nevi of right lower limb, including hip: Secondary | ICD-10-CM | POA: Diagnosis not present

## 2023-08-24 DIAGNOSIS — L739 Follicular disorder, unspecified: Secondary | ICD-10-CM | POA: Diagnosis not present

## 2023-08-24 DIAGNOSIS — L578 Other skin changes due to chronic exposure to nonionizing radiation: Secondary | ICD-10-CM | POA: Diagnosis not present

## 2023-08-24 DIAGNOSIS — Z808 Family history of malignant neoplasm of other organs or systems: Secondary | ICD-10-CM | POA: Diagnosis not present

## 2023-08-24 DIAGNOSIS — L7211 Pilar cyst: Secondary | ICD-10-CM | POA: Diagnosis not present

## 2023-08-28 ENCOUNTER — Ambulatory Visit

## 2023-08-28 DIAGNOSIS — M25551 Pain in right hip: Secondary | ICD-10-CM | POA: Diagnosis not present

## 2023-08-28 DIAGNOSIS — M25651 Stiffness of right hip, not elsewhere classified: Secondary | ICD-10-CM | POA: Diagnosis not present

## 2023-08-28 DIAGNOSIS — R29898 Other symptoms and signs involving the musculoskeletal system: Secondary | ICD-10-CM

## 2023-08-28 DIAGNOSIS — R252 Cramp and spasm: Secondary | ICD-10-CM | POA: Diagnosis not present

## 2023-08-28 DIAGNOSIS — M6281 Muscle weakness (generalized): Secondary | ICD-10-CM

## 2023-08-28 NOTE — Therapy (Signed)
 OUTPATIENT PHYSICAL THERAPY LOWER EXTREMITY TREATMENT    Patient Name: [name] [name] MRN: [id number] DOB:10-17-1940, 83 y.o., female Today's Date: 08/28/2023  END OF SESSION:  PT End of Session - 08/28/23 1449     Visit Number 11    Number of Visits 16    Date for PT Re-Evaluation 09/13/23    Authorization Type HU[employer] MCR    Authorization Time Period 16 VISITS APPROVED FOR PT 07/19/2023-09/13/2023    Authorization - Visit Number 11    Authorization - Number of Visits 16    PT Start Time 1450    PT Stop Time 1528    PT Time Calculation (min) 38 min    Activity Tolerance Patient tolerated treatment well    Behavior During Therapy WFL for tasks assessed/performed            Past Medical History:  Diagnosis Date   Anxiety    intermittent   Arthritis    blood in stool    CKD (chronic kidney disease), stage III (HCC)    COPD (chronic obstructive pulmonary disease) (HCC)    Diverticulosis    Emphysema lung (HCC)    Fibromyalgia    GERD (gastroesophageal reflux disease)    Glaucoma    Hemorrhoids    HSV-2 (herpes simplex virus 2) infection 2009   Hypothyroidism    OSA on CPAP 07/27/2006   Osteoporosis    Rectal bleeding    with hemorrhoids   Past Surgical History:  Procedure Laterality Date   BUNIONECTOMY  [id number]   CARPAL TUNNEL RELEASE Right 12/08/2016   Procedure: CARPAL TUNNEL RELEASE;  Surgeon: Lyanne Sample, MD;  Location: [facility];  Service: Orthopedics;  Laterality: Right;  REG/FAB   CARPAL TUNNEL RELEASE Left 07/02/2021   Procedure: CARPAL TUNNEL RELEASE LEFT;  Surgeon: Lyanne Sample, MD;  Location: [facility];  Service: Orthopedics;  Laterality: Left;  45 MIN   CERVICAL FUSION  2008   EYE SURGERY     cataract and glaucoma   hemorrhoids removed     MENISCUS REPAIR Left    TOTAL HIP ARTHROPLASTY Right 07/03/2023   Procedure: TOTAL HIP ARTHROPLASTY ANTERIOR APPROACH;  Surgeon: Neil Balls, MD;  Location: [facility];   Service: Orthopedics;  Laterality: Right;   TRIGGER FINGER RELEASE  2008   Patient Active Problem List   Diagnosis Date Noted   Pre-operative clearance 05/26/2023   Primary osteoarthritis of left knee 02/11/2020   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   Right arm pain 03/07/2019   Diverticulosis 03/06/2019   CKD (chronic kidney disease), stage III (HCC) 09/06/2018   Allergic rhinitis 08/13/2018   Hypothyroid 02/05/2018   Osteopenia with high risk of fracture 04/26/2017   Bilateral tinnitus 04/25/2017   Restrictive lung disease 04/07/2017   Centrilobular emphysema (HCC) 01/19/2017   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   Trigger ring finger of right hand 08/12/2016   Genital herpes 02/16/2016   Primary osteoarthritis of right hip 07/27/2015   ASCVD (arteriosclerotic cardiovascular disease) 02/05/2015   Rectal bleeding 02/05/2015   Pulmonary nodules 02/05/2015   Glaucoma 01/06/2015   Fibromyalgia 01/06/2015   Elevated cholesterol with elevated triglycerides 01/06/2015   Obstructive sleep apnea on CPAP 01/06/2015   Osteoporosis 01/06/2015    PCP: Dr Duaine German  REFERRING PROVIDER: Dr Neil Balls  REFERRING DIAG: R hip pain s/p R THA  THERAPY DIAG:  Stiffness of right hip, not elsewhere classified  Muscle weakness (generalized)  Other symptoms and signs involving  the musculoskeletal system  Pain in right hip  Cramp and spasm  Rationale for Evaluation and Treatment: Rehabilitation  ONSET DATE: 07/03/23 surgery date   SUBJECTIVE:   SUBJECTIVE STATEMENT: Patient states she has no pain, continued numbness/soreness around incision.   PERTINENT HISTORY: Hip pathology for > 5 years; COPD; emphysema; cervical disc surgery; arthritis knees and hands  PAIN:  Are you having pain? Yes: NPRS scale: 0/10 Pain location: R hip  Pain description: tenderness; soreness; aching  Aggravating factors: walking; moving around more  Relieving factors: rest    PRECAUTIONS: Anterior hip - no restrictions per pt per MD  WEIGHT BEARING RESTRICTIONS: No  FALLS:  Has patient fallen in last 6 months? No  LIVING ENVIRONMENT: Lives with: lives with their family and lives alone Lives in: House/apartment Stairs: No Has following equipment at home: Single point cane; Environmental consultant - 2 wheeled   OCCUPATION: retired; [employer]; household chores; silver sneakers; church work   PATIENT GOALS: walk without assistive device; return to silver sneakers and home exercise   NEXT MD VISIT: 08/17/23  OBJECTIVE:  Note: Objective measures were completed at Evaluation unless otherwise noted.  DIAGNOSTIC FINDINGS: 03/02/23 XR IMPRESSION: 1. Severe osteoarthritis of the right hip, substantially progressive from 07/22/2015. 2. Moderate osteoarthritis of the left hip. 3. L4-5 spondylosis.  PATIENT SURVEYS:  LEFS 24/80; 30%   SENSATION: WFL  EDEMA:  Minimal   MUSCLE LENGTH: Hamstrings: Right 55 deg; Left 70 deg Thomas test: Right 0 deg; Left 10 deg  POSTURE: rounded shoulders, forward head, flexed trunk , and weight shift left  PALPATION: Tender anterior hip Scar anterior hip   LOWER EXTREMITY ROM:  Active ROM Right eval Right 08/09/23 Right  08/23/23  Hip flexion 90 110 111  Hip extension 0 5 5  Hip abduction 20 64 64  Hip adduction     Hip internal rotation     Hip external rotation     Knee flexion 120 132 132  Knee extension 0  0  Ankle dorsiflexion     Ankle plantarflexion     Ankle inversion     Ankle eversion      (Blank rows = not tested)  LOWER EXTREMITY MMT: R not tested resistively - moving well against gravity   L LE - WNL's    MMT Right eval Right  08/23/23  Hip flexion 4 5  Hip extension 4   Hip abduction 4 5  Hip adduction    Hip internal rotation    Hip external rotation    Knee flexion    Knee extension    Ankle dorsiflexion    Ankle plantarflexion    Ankle inversion    Ankle eversion     (Blank rows = not  tested)   FUNCTIONAL TESTS:  5 times sit to stand: 13.63 sec use of UEs on table  08/15/13: 5 times sit to stand 13.61 no use of UE's   GAIT: Distance walked: 40 Assistive device utilized: Environmental consultant - 2 wheeled Level of assistance: Complete Independence Comments: antalgic gait limp R LE   Distance walked: 40 Assistive device utilized: SPC or no assistive device  Level of assistance: Complete Independence Comments: gait pattern is improving with less limp R LE in wt bearing R    [facility] Adult PT Treatment:  DATE: 08/28/2023 Therapeutic Exercise: Waking on treadmill: 1.2- 1.4 mph --> focus on stride length & equal x 3 min Standing: Resisted hip abduction & extension + RTB x10 each (B) 3-way hip + YTB (B) --> L toe taps on R SLS Supine: LTR x 1 min ITB stretches with strap 2x30"  Hip add stretch with strap x30" Butterfly stretch (hip add) x1 min Seated HS stretch (R) 5x10" Neuromuscular re-ed: Side Lying  Straight leg hip abd x10 Straight leg fwd/bkwd arc lift Marching + 3#AW 2x20    [facility] Adult PT Treatment:                                                DATE: 08/23/2023 Therapeutic Exercise: Nustep L 6 x 5 min  Sidelying: R hip abductin leading with heel x 15 Therapeutic Activity: Sit to stand x 10 x 2 no UE support from slightly elevated treatment table -  slow eccentric stand to sit  Hip abduction standing x 10 x 2 R/L  SLS R/L 20 sec x 3  Manual:   STM working through the anterior hip and thigh   Scar massage/stretching  PT assist to stretch R hip flexors supine Thomas 30 sec x 1; 45 sec x 1  Gait: Treadmill at 1.0 mph x 3 min  Gait training without assistive device around gym x 2    [facility] Adult PT Treatment:                                                DATE: 08/21/2023 Therapeutic Exercise: Collene Dawson + blue TB hip abd iso Resisted side step out/in + GTB crossed at ankles  Standing: Hip extension + YTB (R) 2x10 HS curls  + 2#AW 2x10 Neuromuscular re-ed: Hooklying:  Hip add + orange ball squeeze Hip add isometric + opp knee extension Bilateral clamshells x15 --> bent knee fall out x15  Therapeutic Activity: Backwards walking --> stride length, lifting feet Standing squats + HHAx2 at counter --> no HHA, reaching out over counter Marching + 2#AW x 30" --> SOB                                                                                                                              PATIENT EDUCATION:  Education details: Updated HEP  Person educated: Patient Education method: Explanation, Demonstration, Tactile cues, Verbal cues, and Handouts Education comprehension: verbalized understanding, returned demonstration, verbal cues required, tactile cues required, and needs further education  HOME EXERCISE PROGRAM: Access Code: [email or web address] URL: [email or web address] Date: 08/21/2023 Prepared by: Sims Duck  Exercises - Supine Bridge with Resistance Band  - 1 x daily - 7 x weekly - 3 sets - 10  reps - Hooklying Single Leg Bent Knee Fallouts with Resistance  - 1 x daily - 7 x weekly - 3 sets - 10 reps - Sit to Stand Without Arm Support  - 1 x daily - 7 x weekly - 3 sets - 10 reps - Standing Hip Extension with Counter Support  - 2 x daily - 7 x weekly - 1-2 sets - 10 reps - 3-5 sec  hold - Standing Hip Abduction with Counter Support  - 1 x daily - 7 x weekly - 2-3 sets - 10 reps - 2-3 sec  hold - Standing Knee Flexion Strengthening at Chair  - 2 x daily - 7 x weekly - 1 sets - 10 reps - 2-3 sec  hold - Standing Hip Flexion  - 2 x daily - 7 x weekly - 1 sets - 10 reps - 2-3 sec  hold - Backward Walking with Counter Support  - 1 x daily - 7 x weekly - 3 sets - 10 reps - Side Stepping with Counter Support  - 1 x daily - 7 x weekly - 3 sets - 10 reps - Squat with Counter Support  - 1 x daily - 7 x weekly - 3 sets - 10 reps  ASSESSMENT:  CLINICAL IMPRESSION: Patient demonstrated decreased stride  length during gait training on treadmill (L<R). Hip strenthening exercises continued, focusing more on standing exercises. Patient fatigued quickly due to low energy today.    OBJECTIVE IMPAIRMENTS: Abnormal gait, decreased activity tolerance, decreased balance, decreased endurance, decreased mobility, difficulty walking, decreased ROM, decreased strength, impaired flexibility, improper body mechanics, postural dysfunction, and pain.    GOALS: Goals reviewed with patient? Yes  SHORT TERM GOALS: Target date: 08/16/2023  Independent in initial HEP  Baseline: Goal status: MET  2.  Independent in basic transfers sit <-> stand; bed mobility Baseline:  Goal status: MET  3.  Ambulation with single point cane with safe, functional gait for in the home Baseline:  Goal status: MET   LONG TERM GOALS: Target date: 09/13/2023  Independent in all transfers and transitional activities  Baseline:  Goal status: met   2.  4+/5 to 5/5 strength R LE  Baseline:  Goal status: met   3.  Independent gait without assistive device for community distances  Baseline: rolling walker 08/16/23: SPC and without assistive device in home  Goal status: on going   4.  Return to community based exercise such as silver sneakers 2 days/week  Baseline:  Goal status: on going   5.  Independent in HEP  Baseline:  Goal status: on going   6.  Improve LEFS by 20% or greater  Baseline: LEFS 24/80; 30%; 08/14/23: 50/80 = 62.5% Goal status: met   PLAN:  PT FREQUENCY: 2x/week  PT DURATION: 8 weeks  PLANNED INTERVENTIONS: 97110-Therapeutic exercises, 97530- Therapeutic activity, 97112- Neuromuscular re-education, 97535- Self Care, 21308- Manual therapy, [phone]- Gait training, [phone]- Aquatic Therapy, [phone]- Ionotophoresis 4mg /ml Dexamethasone , Patient/Family education, Balance training, Stair training, Taping, Dry Needling, Cryotherapy, and Moist heat  PLAN FOR NEXT SESSION: Progress hip strengthening and balance  exercises as tolerated  Flint Hummer, PTA 08/28/2023, 3:31 PM

## 2023-08-29 DIAGNOSIS — H401122 Primary open-angle glaucoma, left eye, moderate stage: Secondary | ICD-10-CM | POA: Diagnosis not present

## 2023-08-29 DIAGNOSIS — H401111 Primary open-angle glaucoma, right eye, mild stage: Secondary | ICD-10-CM | POA: Diagnosis not present

## 2023-08-29 DIAGNOSIS — D3131 Benign neoplasm of right choroid: Secondary | ICD-10-CM | POA: Diagnosis not present

## 2023-08-29 DIAGNOSIS — H43813 Vitreous degeneration, bilateral: Secondary | ICD-10-CM | POA: Diagnosis not present

## 2023-08-29 DIAGNOSIS — Z961 Presence of intraocular lens: Secondary | ICD-10-CM | POA: Diagnosis not present

## 2023-08-29 DIAGNOSIS — H18523 Epithelial (juvenile) corneal dystrophy, bilateral: Secondary | ICD-10-CM | POA: Diagnosis not present

## 2023-08-29 DIAGNOSIS — H526 Other disorders of refraction: Secondary | ICD-10-CM | POA: Diagnosis not present

## 2023-08-29 DIAGNOSIS — H26493 Other secondary cataract, bilateral: Secondary | ICD-10-CM | POA: Diagnosis not present

## 2023-08-30 ENCOUNTER — Ambulatory Visit

## 2023-08-30 DIAGNOSIS — M25551 Pain in right hip: Secondary | ICD-10-CM | POA: Diagnosis not present

## 2023-08-30 DIAGNOSIS — R252 Cramp and spasm: Secondary | ICD-10-CM | POA: Diagnosis not present

## 2023-08-30 DIAGNOSIS — M25651 Stiffness of right hip, not elsewhere classified: Secondary | ICD-10-CM | POA: Diagnosis not present

## 2023-08-30 DIAGNOSIS — R29898 Other symptoms and signs involving the musculoskeletal system: Secondary | ICD-10-CM

## 2023-08-30 DIAGNOSIS — M6281 Muscle weakness (generalized): Secondary | ICD-10-CM

## 2023-08-30 NOTE — Therapy (Signed)
 OUTPATIENT PHYSICAL THERAPY LOWER EXTREMITY TREATMENT    Patient Name: [name] [name] MRN: [id number] DOB:Jul 03, 1940, 83 y.o., female Today's Date: 08/30/2023  END OF SESSION:  PT End of Session - 08/30/23 1445     Visit Number 12    Number of Visits 16    Date for PT Re-Evaluation 09/13/23    Authorization Type HUMA[employer] MCR    Authorization Time Period 16 VISITS APPROVED FOR PT 07/19/2023-09/13/2023    Authorization - Visit Number 12    Authorization - Number of Visits 16    PT Start Time 1445    PT Stop Time 1528    PT Time Calculation (min) 43 min    Activity Tolerance Patient tolerated treatment well    Behavior During Therapy WFL for tasks assessed/performed            Past Medical History:  Diagnosis Date   Anxiety    intermittent   Arthritis    blood in stool    CKD (chronic kidney disease), stage III (HCC)    COPD (chronic obstructive pulmonary disease) (HCC)    Diverticulosis    Emphysema lung (HCC)    Fibromyalgia    GERD (gastroesophageal reflux disease)    Glaucoma    Hemorrhoids    HSV-2 (herpes simplex virus 2) infection 2009   Hypothyroidism    OSA on CPAP 07/27/2006   Osteoporosis    Rectal bleeding    with hemorrhoids   Past Surgical History:  Procedure Laterality Date   BUNIONECTOMY  [id number]   CARPAL TUNNEL RELEASE Right 12/08/2016   Procedure: CARPAL TUNNEL RELEASE;  Surgeon: Lyanne Sample, MD;  Location: [facility];  Service: Orthopedics;  Laterality: Right;  REG/FAB   CARPAL TUNNEL RELEASE Left 07/02/2021   Procedure: CARPAL TUNNEL RELEASE LEFT;  Surgeon: Lyanne Sample, MD;  Location: [facility];  Service: Orthopedics;  Laterality: Left;  45 MIN   CERVICAL FUSION  2008   EYE SURGERY     cataract and glaucoma   hemorrhoids removed     MENISCUS REPAIR Left    TOTAL HIP ARTHROPLASTY Right 07/03/2023   Procedure: TOTAL HIP ARTHROPLASTY ANTERIOR APPROACH;  Surgeon: Neil Balls, MD;  Location: [facility];   Service: Orthopedics;  Laterality: Right;   TRIGGER FINGER RELEASE  2008   Patient Active Problem List   Diagnosis Date Noted   Pre-operative clearance 05/26/2023   Primary osteoarthritis of left knee 02/11/2020   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   Right arm pain 03/07/2019   Diverticulosis 03/06/2019   CKD (chronic kidney disease), stage III (HCC) 09/06/2018   Allergic rhinitis 08/13/2018   Hypothyroid 02/05/2018   Osteopenia with high risk of fracture 04/26/2017   Bilateral tinnitus 04/25/2017   Restrictive lung disease 04/07/2017   Centrilobular emphysema (HCC) 01/19/2017   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   Trigger ring finger of right hand 08/12/2016   Genital herpes 02/16/2016   Primary osteoarthritis of right hip 07/27/2015   ASCVD (arteriosclerotic cardiovascular disease) 02/05/2015   Rectal bleeding 02/05/2015   Pulmonary nodules 02/05/2015   Glaucoma 01/06/2015   Fibromyalgia 01/06/2015   Elevated cholesterol with elevated triglycerides 01/06/2015   Obstructive sleep apnea on CPAP 01/06/2015   Osteoporosis 01/06/2015    PCP: Dr Duaine German  REFERRING PROVIDER: Dr Neil Balls  REFERRING DIAG: R hip pain s/p R THA  THERAPY DIAG:  Stiffness of right hip, not elsewhere classified  Muscle weakness (generalized)  Other symptoms and signs involving  the musculoskeletal system  Pain in right hip  Cramp and spasm  Rationale for Evaluation and Treatment: Rehabilitation  ONSET DATE: 07/03/23 surgery date   SUBJECTIVE:   SUBJECTIVE STATEMENT: Patient reports she feels good today; states she has been walking short distances outside without SPC.    PERTINENT HISTORY: Hip pathology for > 5 years; COPD; emphysema; cervical disc surgery; arthritis knees and hands  PAIN:  Are you having pain? Yes: NPRS scale: 0/10 Pain location: R hip  Pain description: tenderness; soreness; aching  Aggravating factors: walking; moving around more   Relieving factors: rest   PRECAUTIONS: Anterior hip - no restrictions per pt per MD  WEIGHT BEARING RESTRICTIONS: No  FALLS:  Has patient fallen in last 6 months? No  LIVING ENVIRONMENT: Lives with: lives with their family and lives alone Lives in: House/apartment Stairs: No Has following equipment at home: Single point cane; Environmental consultant - 2 wheeled   OCCUPATION: retired; [employer]; household chores; silver sneakers; church work   PATIENT GOALS: walk without assistive device; return to silver sneakers and home exercise   NEXT MD VISIT: June 2025  OBJECTIVE:  Note: Objective measures were completed at Evaluation unless otherwise noted.  DIAGNOSTIC FINDINGS: 03/02/23 XR IMPRESSION: 1. Severe osteoarthritis of the right hip, substantially progressive from 07/22/2015. 2. Moderate osteoarthritis of the left hip. 3. L4-5 spondylosis.  PATIENT SURVEYS:  LEFS 24/80; 30%   SENSATION: WFL  EDEMA:  Minimal   MUSCLE LENGTH: Hamstrings: Right 55 deg; Left 70 deg Thomas test: Right 0 deg; Left 10 deg  POSTURE: rounded shoulders, forward head, flexed trunk , and weight shift left  PALPATION: Tender anterior hip Scar anterior hip   LOWER EXTREMITY ROM:  Active ROM Right eval Right 08/09/23 Right  08/23/23  Hip flexion 90 110 111  Hip extension 0 5 5  Hip abduction 20 64 64  Hip adduction     Hip internal rotation     Hip external rotation     Knee flexion 120 132 132  Knee extension 0  0  Ankle dorsiflexion     Ankle plantarflexion     Ankle inversion     Ankle eversion      (Blank rows = not tested)  LOWER EXTREMITY MMT: R not tested resistively - moving well against gravity   L LE - WNL's    MMT Right eval Right  08/23/23  Hip flexion 4 5  Hip extension 4   Hip abduction 4 5  Hip adduction    Hip internal rotation    Hip external rotation    Knee flexion    Knee extension    Ankle dorsiflexion    Ankle plantarflexion    Ankle inversion    Ankle  eversion     (Blank rows = not tested)   FUNCTIONAL TESTS:  5 times sit to stand: 13.63 sec use of UEs on table  08/15/13: 5 times sit to stand 13.61 no use of UE's   GAIT: Distance walked: 40 Assistive device utilized: Environmental consultant - 2 wheeled Level of assistance: Complete Independence Comments: antalgic gait limp R LE   Distance walked: 40 Assistive device utilized: SPC or no assistive device  Level of assistance: Complete Independence Comments: gait pattern is improving with less limp R LE in wt bearing R   [facility] Adult PT Treatment:  DATE: 08/30/2023 Therapeutic Exercise: Treadmill warm-up: 1.5 mph, 0% incline x 5 min Standing: Hip abd + 3#AW 2x15 (B) Hip extension + 3#AW 2x10 (B) Neuromuscular re-ed: Seated:  Clamshells + blue TB 3x10 LAQ (R) + 4#AW 2x10 Therapeutic Activity: STS x 6 --> added RTB around knees for hip abd stability x 10 Walking without AD x 240'   [facility] Adult PT Treatment:                                                DATE: 08/28/2023 Therapeutic Exercise: Waking on treadmill: 1.2- 1.4 mph --> focus on stride length & equal x 3 min Standing: Resisted hip abduction & extension + RTB x10 each (B) 3-way hip + YTB (B) --> L toe taps on R SLS Supine: LTR x 1 min ITB stretches with strap 2x30"  Hip add stretch with strap x30" Butterfly stretch (hip add) x1 min Seated HS stretch (R) 5x10" Neuromuscular re-ed: Side Lying  Straight leg hip abd x10 Straight leg fwd/bkwd arc lift Marching + 3#AW 2x20   [facility] Adult PT Treatment:                                                DATE: 08/23/2023 Therapeutic Exercise: Nustep L 6 x 5 min  Sidelying: R hip abductin leading with heel x 15 Therapeutic Activity: Sit to stand x 10 x 2 no UE support from slightly elevated treatment table -  slow eccentric stand to sit  Hip abduction standing x 10 x 2 R/L  SLS R/L 20 sec x 3  Manual:   STM working through the anterior hip and  thigh   Scar massage/stretching  PT assist to stretch R hip flexors supine Thomas 30 sec x 1; 45 sec x 1  Gait: Treadmill at 1.0 mph x 3 min  Gait training without assistive device around gym x 2                                                                                                                           PATIENT EDUCATION:  Education details: Updated HEP  Person educated: Patient Education method: Explanation, Demonstration, Tactile cues, Verbal cues, and Handouts Education comprehension: verbalized understanding, returned demonstration, verbal cues required, tactile cues required, and needs further education  HOME EXERCISE PROGRAM: Access Code: [email or web address] URL: [email or web address] Date: 08/21/2023 Prepared by: Sims Duck  Exercises - Supine Bridge with Resistance Band  - 1 x daily - 7 x weekly - 3 sets - 10 reps - Hooklying Single Leg Bent Knee Fallouts with Resistance  - 1 x daily - 7 x weekly - 3 sets - 10 reps - Sit to Stand Without Arm Support  -  1 x daily - 7 x weekly - 3 sets - 10 reps - Standing Hip Extension with Counter Support  - 2 x daily - 7 x weekly - 1-2 sets - 10 reps - 3-5 sec  hold - Standing Hip Abduction with Counter Support  - 1 x daily - 7 x weekly - 2-3 sets - 10 reps - 2-3 sec  hold - Standing Knee Flexion Strengthening at Chair  - 2 x daily - 7 x weekly - 1 sets - 10 reps - 2-3 sec  hold - Standing Hip Flexion  - 2 x daily - 7 x weekly - 1 sets - 10 reps - 2-3 sec  hold - Backward Walking with Counter Support  - 1 x daily - 7 x weekly - 3 sets - 10 reps - Side Stepping with Counter Support  - 1 x daily - 7 x weekly - 3 sets - 10 reps - Squat with Counter Support  - 1 x daily - 7 x weekly - 3 sets - 10 reps  ASSESSMENT:  CLINICAL IMPRESSION: Treadmill walking continued to progress endurance for home exercise. Gait training continued with no AD to progress endurance with physical activity and functional LE strength.    OBJECTIVE  IMPAIRMENTS: Abnormal gait, decreased activity tolerance, decreased balance, decreased endurance, decreased mobility, difficulty walking, decreased ROM, decreased strength, impaired flexibility, improper body mechanics, postural dysfunction, and pain.    GOALS: Goals reviewed with patient? Yes  SHORT TERM GOALS: Target date: 08/16/2023  Independent in initial HEP  Baseline: Goal status: MET  2.  Independent in basic transfers sit <-> stand; bed mobility Baseline:  Goal status: MET  3.  Ambulation with single point cane with safe, functional gait for in the home Baseline:  Goal status: MET   LONG TERM GOALS: Target date: 09/13/2023  Independent in all transfers and transitional activities  Baseline:  Goal status: MET   2.  4+/5 to 5/5 strength R LE  Baseline:  Goal status: MET  3.  Independent gait without assistive device for community distances  Baseline: rolling walker 08/16/23: SPC and without assistive device in home 08/30/23: 240'  Goal status: MET  4.  Return to community based exercise such as silver sneakers 2 days/week  Baseline:  Goal status: on going   5.  Independent in HEP  Baseline:  Goal status: on going   6.  Improve LEFS by 20% or greater  Baseline: LEFS 24/80; 30%; 08/14/23: 50/80 = 62.5% Goal status: MET   PLAN:  PT FREQUENCY: 2x/week  PT DURATION: 8 weeks  PLANNED INTERVENTIONS: 97110-Therapeutic exercises, 97530- Therapeutic activity, 97112- Neuromuscular re-education, 97535- Self Care, 09811- Manual therapy, [phone]- Gait training, [phone]- Aquatic Therapy, [phone]- Ionotophoresis 4mg /ml Dexamethasone , Patient/Family education, Balance training, Stair training, Taping, Dry Needling, Cryotherapy, and Moist heat  PLAN FOR NEXT SESSION: Progress hip strengthening and balance exercises as tolerated  Flint Hummer, PTA 08/30/2023, 3:30 PM

## 2023-09-04 ENCOUNTER — Ambulatory Visit

## 2023-09-04 DIAGNOSIS — M25551 Pain in right hip: Secondary | ICD-10-CM

## 2023-09-04 DIAGNOSIS — M6281 Muscle weakness (generalized): Secondary | ICD-10-CM

## 2023-09-04 DIAGNOSIS — R29898 Other symptoms and signs involving the musculoskeletal system: Secondary | ICD-10-CM | POA: Diagnosis not present

## 2023-09-04 DIAGNOSIS — R252 Cramp and spasm: Secondary | ICD-10-CM

## 2023-09-04 DIAGNOSIS — M25651 Stiffness of right hip, not elsewhere classified: Secondary | ICD-10-CM

## 2023-09-04 NOTE — Therapy (Signed)
 OUTPATIENT PHYSICAL THERAPY LOWER EXTREMITY TREATMENT    Patient Name: [name] [name] MRN: [id number] DOB:1941/04/08, 83 y.o., female Today's Date: 09/04/2023  END OF SESSION:  PT End of Session - 09/04/23 1447     Visit Number 13    Number of Visits 16    Date for PT Re-Evaluation 09/13/23    Authorization Type HU[employer] MCR    Authorization Time Period 16 VISITS APPROVED FOR PT 07/19/2023-09/13/2023    Authorization - Visit Number 13    Authorization - Number of Visits 16    PT Start Time 1447    PT Stop Time 1528    PT Time Calculation (min) 41 min    Activity Tolerance Patient tolerated treatment well    Behavior During Therapy WFL for tasks assessed/performed            Past Medical History:  Diagnosis Date   Anxiety    intermittent   Arthritis    blood in stool    CKD (chronic kidney disease), stage III (HCC)    COPD (chronic obstructive pulmonary disease) (HCC)    Diverticulosis    Emphysema lung (HCC)    Fibromyalgia    GERD (gastroesophageal reflux disease)    Glaucoma    Hemorrhoids    HSV-2 (herpes simplex virus 2) infection 2009   Hypothyroidism    OSA on CPAP 07/27/2006   Osteoporosis    Rectal bleeding    with hemorrhoids   Past Surgical History:  Procedure Laterality Date   BUNIONECTOMY  [id number]   CARPAL TUNNEL RELEASE Right 12/08/2016   Procedure: CARPAL TUNNEL RELEASE;  Surgeon: Lyanne Sample, MD;  Location: [facility];  Service: Orthopedics;  Laterality: Right;  REG/FAB   CARPAL TUNNEL RELEASE Left 07/02/2021   Procedure: CARPAL TUNNEL RELEASE LEFT;  Surgeon: Lyanne Sample, MD;  Location: [facility];  Service: Orthopedics;  Laterality: Left;  45 MIN   CERVICAL FUSION  2008   EYE SURGERY     cataract and glaucoma   hemorrhoids removed     MENISCUS REPAIR Left    TOTAL HIP ARTHROPLASTY Right 07/03/2023   Procedure: TOTAL HIP ARTHROPLASTY ANTERIOR APPROACH;  Surgeon: Neil Balls, MD;  Location: [facility];   Service: Orthopedics;  Laterality: Right;   TRIGGER FINGER RELEASE  2008   Patient Active Problem List   Diagnosis Date Noted   Pre-operative clearance 05/26/2023   Primary osteoarthritis of left knee 02/11/2020   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   Right arm pain 03/07/2019   Diverticulosis 03/06/2019   CKD (chronic kidney disease), stage III (HCC) 09/06/2018   Allergic rhinitis 08/13/2018   Hypothyroid 02/05/2018   Osteopenia with high risk of fracture 04/26/2017   Bilateral tinnitus 04/25/2017   Restrictive lung disease 04/07/2017   Centrilobular emphysema (HCC) 01/19/2017   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   Trigger ring finger of right hand 08/12/2016   Genital herpes 02/16/2016   Primary osteoarthritis of right hip 07/27/2015   ASCVD (arteriosclerotic cardiovascular disease) 02/05/2015   Rectal bleeding 02/05/2015   Pulmonary nodules 02/05/2015   Glaucoma 01/06/2015   Fibromyalgia 01/06/2015   Elevated cholesterol with elevated triglycerides 01/06/2015   Obstructive sleep apnea on CPAP 01/06/2015   Osteoporosis 01/06/2015    PCP: Dr Duaine German  REFERRING PROVIDER: Dr Neil Balls  REFERRING DIAG: R hip pain s/p R THA  THERAPY DIAG:  Stiffness of right hip, not elsewhere classified  Muscle weakness (generalized)  Other symptoms and signs involving  the musculoskeletal system  Cramp and spasm  Pain in right hip  Rationale for Evaluation and Treatment: Rehabilitation  ONSET DATE: 07/03/23 surgery date   SUBJECTIVE:   SUBJECTIVE STATEMENT: Patient reports she was doing yard work and pulling weeds when she fell forward and landed on her knees; states she was able to get up on her own from quadruped position. Patient denies any injuries, bruising or pain from fall, states her hips are sore.     PERTINENT HISTORY: Hip pathology for > 5 years; COPD; emphysema; cervical disc surgery; arthritis knees and hands  PAIN:  Are you having  pain? Yes: NPRS scale: 0/10 Pain location: R hip  Pain description: tenderness; soreness; aching  Aggravating factors: walking; moving around more  Relieving factors: rest   PRECAUTIONS: Anterior hip - no restrictions per pt per MD  WEIGHT BEARING RESTRICTIONS: No  FALLS:  Has patient fallen in last 6 months? No  LIVING ENVIRONMENT: Lives with: lives with their family and lives alone Lives in: House/apartment Stairs: No Has following equipment at home: Single point cane; Environmental consultant - 2 wheeled   OCCUPATION: retired; [employer]; household chores; silver sneakers; church work   PATIENT GOALS: walk without assistive device; return to silver sneakers and home exercise   NEXT MD VISIT: June 2025  OBJECTIVE:  Note: Objective measures were completed at Evaluation unless otherwise noted.  DIAGNOSTIC FINDINGS: 03/02/23 XR IMPRESSION: 1. Severe osteoarthritis of the right hip, substantially progressive from 07/22/2015. 2. Moderate osteoarthritis of the left hip. 3. L4-5 spondylosis.  PATIENT SURVEYS:  LEFS 24/80; 30%   SENSATION: WFL  EDEMA:  Minimal   MUSCLE LENGTH: Hamstrings: Right 55 deg; Left 70 deg Thomas test: Right 0 deg; Left 10 deg  POSTURE: rounded shoulders, forward head, flexed trunk , and weight shift left  PALPATION: Tender anterior hip Scar anterior hip   LOWER EXTREMITY ROM:  Active ROM Right eval Right 08/09/23 Right  08/23/23  Hip flexion 90 110 111  Hip extension 0 5 5  Hip abduction 20 64 64  Hip adduction     Hip internal rotation     Hip external rotation     Knee flexion 120 132 132  Knee extension 0  0  Ankle dorsiflexion     Ankle plantarflexion     Ankle inversion     Ankle eversion      (Blank rows = not tested)  LOWER EXTREMITY MMT: R not tested resistively - moving well against gravity   L LE - WNL's    MMT Right eval Right  08/23/23  Hip flexion 4 5  Hip extension 4   Hip abduction 4 5  Hip adduction    Hip internal  rotation    Hip external rotation    Knee flexion    Knee extension    Ankle dorsiflexion    Ankle plantarflexion    Ankle inversion    Ankle eversion     (Blank rows = not tested)   FUNCTIONAL TESTS:  5 times sit to stand: 13.63 sec use of UEs on table  08/15/13: 5 times sit to stand 13.61 no use of UE's   GAIT: Distance walked: 40 Assistive device utilized: Environmental consultant - 2 wheeled Level of assistance: Complete Independence Comments: antalgic gait limp R LE   Distance walked: 40 Assistive device utilized: SPC or no assistive device  Level of assistance: Complete Independence Comments: gait pattern is improving with less limp R LE in wt bearing R  [facility] Adult PT Treatment:                                                DATE: 09/04/2023 Therapeutic Exercise: Seated HS stretch (B) Standing: Hip abd + 2#AW --> 3#AW Hip extension + 2#AW --> 3#AW HS curl + 2#AW --> 3#AW Neuromuscular re-ed: Marching 4x15' Marching + same knee tap 2x15' Marching + opp knee tap 2x15' Therapeutic Activity: Walking without AD x 250' STS from corner of mat table  Marching in place + 3#AW x 30" Side marching + 3#AW at counter for balance as needed Self care: Ice or heat fro hip soreness prn    [facility] Adult PT Treatment:                                                DATE: 08/30/2023 Therapeutic Exercise: Treadmill warm-up: 1.5 mph, 0% incline x 5 min Standing: Hip abd + 3#AW 2x15 (B) Hip extension + 3#AW 2x10 (B) Neuromuscular re-ed: Seated:  Clamshells + blue TB 3x10 LAQ (R) + 4#AW 2x10 Therapeutic Activity: STS x 6 --> added RTB around knees for hip abd stability x 10 Walking without AD x 240'   [facility] Adult PT Treatment:                                                DATE: 08/28/2023 Therapeutic Exercise: Waking on treadmill: 1.2- 1.4 mph --> focus on stride length & equal x 3 min Standing: Resisted hip abduction & extension + RTB x10 each (B) 3-way hip + YTB (B) --> L toe taps on R  SLS Supine: LTR x 1 min ITB stretches with strap 2x30"  Hip add stretch with strap x30" Butterfly stretch (hip add) x1 min Seated HS stretch (R) 5x10" Neuromuscular re-ed: Side Lying  Straight leg hip abd x10 Straight leg fwd/bkwd arc lift Marching + 3#AW 2x20                                                                                                                          PATIENT EDUCATION:  Education details: Updated HEP  Person educated: Patient Education method: Explanation, Demonstration, Tactile cues, Verbal cues, and Handouts Education comprehension: verbalized understanding, returned demonstration, verbal cues required, tactile cues required, and needs further education  HOME EXERCISE PROGRAM: Access Code: [email or web address] URL: [email or web address] Date: 09/04/2023 Prepared by: Sims Duck  Exercises - Sit to Stand Without Arm Support  - 1 x daily - 7 x weekly - 3 sets - 10 reps - Standing Hip Extension with Counter Support  - 2  x daily - 7 x weekly - 1-2 sets - 10 reps - 3-5 sec  hold - Standing Hip Abduction with Counter Support  - 1 x daily - 7 x weekly - 2-3 sets - 10 reps - 2-3 sec  hold - Standing Knee Flexion Strengthening at Chair  - 2 x daily - 7 x weekly - 1 sets - 10 reps - 2-3 sec  hold - Standing Hip Flexion  - 2 x daily - 7 x weekly - 1 sets - 10 reps - 2-3 sec  hold - Backward Walking with Counter Support  - 1 x daily - 7 x weekly - 3 sets - 10 reps - Squat with Counter Support  - 1 x daily - 7 x weekly - 3 sets - 10 reps  ASSESSMENT:  CLINICAL IMPRESSION: Patient presents with SOB and requires inhaler prior to walking long distances; recommended patient begin walking on treadmill to progress cardiovascular endurance as tolerated. HEP updated and edited to focus on standing hip strengthening exercises with added ankle weights for resistance. SBA provided during walking marches due to initial instability with dynamic balance.    OBJECTIVE  IMPAIRMENTS: Abnormal gait, decreased activity tolerance, decreased balance, decreased endurance, decreased mobility, difficulty walking, decreased ROM, decreased strength, impaired flexibility, improper body mechanics, postural dysfunction, and pain.    GOALS: Goals reviewed with patient? Yes  SHORT TERM GOALS: Target date: 08/16/2023  Independent in initial HEP  Baseline: Goal status: MET  2.  Independent in basic transfers sit <-> stand; bed mobility Baseline:  Goal status: MET  3.  Ambulation with single point cane with safe, functional gait for in the home Baseline:  Goal status: MET   LONG TERM GOALS: Target date: 09/13/2023  Independent in all transfers and transitional activities  Baseline:  Goal status: MET   2.  4+/5 to 5/5 strength R LE  Baseline:  Goal status: MET  3.  Independent gait without assistive device for community distances  Baseline: rolling walker 08/16/23: SPC and without assistive device in home 08/30/23: 240'  Goal status: MET  4.  Return to community based exercise such as silver sneakers 2 days/week  Baseline:  09/04/23: has not returned Goal status: on going   5.  Independent in HEP  Baseline:  Goal status: MET   6.  Improve LEFS by 20% or greater  Baseline: LEFS 24/80; 30%; 08/14/23: 50/80 = 62.5% Goal status: MET   PLAN:  PT FREQUENCY: 2x/week  PT DURATION: 8 weeks  PLANNED INTERVENTIONS: 97110-Therapeutic exercises, 97530- Therapeutic activity, 97112- Neuromuscular re-education, 97535- Self Care, 21308- Manual therapy, [phone]- Gait training, [phone]- Aquatic Therapy, [phone]- Ionotophoresis 4mg /ml Dexamethasone , Patient/Family education, Balance training, Stair training, Taping, Dry Needling, Cryotherapy, and Moist heat  PLAN FOR NEXT SESSION: Discharge   Flint Hummer, PTA 09/04/2023, 3:32 PM

## 2023-09-06 ENCOUNTER — Encounter: Payer: Self-pay | Admitting: Rehabilitative and Restorative Service Providers"

## 2023-09-06 ENCOUNTER — Ambulatory Visit: Admitting: Rehabilitative and Restorative Service Providers"

## 2023-09-06 DIAGNOSIS — M25551 Pain in right hip: Secondary | ICD-10-CM

## 2023-09-06 DIAGNOSIS — M6281 Muscle weakness (generalized): Secondary | ICD-10-CM | POA: Diagnosis not present

## 2023-09-06 DIAGNOSIS — R252 Cramp and spasm: Secondary | ICD-10-CM | POA: Diagnosis not present

## 2023-09-06 DIAGNOSIS — M25651 Stiffness of right hip, not elsewhere classified: Secondary | ICD-10-CM

## 2023-09-06 DIAGNOSIS — R29898 Other symptoms and signs involving the musculoskeletal system: Secondary | ICD-10-CM | POA: Diagnosis not present

## 2023-09-06 NOTE — Therapy (Signed)
 OUTPATIENT PHYSICAL THERAPY LOWER EXTREMITY TREATMENT PHYSICAL THERAPY DISCHARGE SUMMARY  Visits from Start of Care: 14  Current functional level related to goals / functional outcomes: See progress note for discharge status    Remaining deficits: Needs to continue with HEP    Education / Equipment: HEP    Patient agrees to discharge. Patient goals were met. Patient is being discharged due to meeting the stated rehab goals.  Loza Prell P. Geraldo Klippel PT, MPH 09/06/23 3:31 PM      Patient Name: [name] [name] MRN: [id number] DOB:1940-12-04, 83 y.o., female Today's Date: 09/06/2023  END OF SESSION:  PT End of Session - 09/06/23 1452     Visit Number 14    Number of Visits 16    Date for PT Re-Evaluation 09/13/23    Authorization Type [employer] MCR    Authorization Time Period 16 VISITS APPROVED FOR PT 07/19/2023-09/13/2023    Authorization - Visit Number 14    Authorization - Number of Visits 16    Progress Note Due on Visit 20    PT Start Time 1447    PT Stop Time 1530    PT Time Calculation (min) 43 min    Activity Tolerance Patient tolerated treatment well            Past Medical History:  Diagnosis Date   Anxiety    intermittent   Arthritis    blood in stool    CKD (chronic kidney disease), stage III (HCC)    COPD (chronic obstructive pulmonary disease) (HCC)    Diverticulosis    Emphysema lung (HCC)    Fibromyalgia    GERD (gastroesophageal reflux disease)    Glaucoma    Hemorrhoids    HSV-2 (herpes simplex virus 2) infection 2009   Hypothyroidism    OSA on CPAP 07/27/2006   Osteoporosis    Rectal bleeding    with hemorrhoids   Past Surgical History:  Procedure Laterality Date   BUNIONECTOMY  4098,1191   CARPAL TUNNEL RELEASE Right 12/08/2016   Procedure: CARPAL TUNNEL RELEASE;  Surgeon: Lyanne Sample, MD;  Location: [facility];  Service: Orthopedics;  Laterality: Right;  REG/FAB   CARPAL TUNNEL RELEASE Left 07/02/2021   Procedure:  CARPAL TUNNEL RELEASE LEFT;  Surgeon: Lyanne Sample, MD;  Location:  SURGERY CENTER;  Service: Orthopedics;  Laterality: Left;  45 MIN   CERVICAL FUSION  2008   EYE SURGERY     cataract and glaucoma   hemorrhoids removed     MENISCUS REPAIR Left    TOTAL HIP ARTHROPLASTY Right 07/03/2023   Procedure: TOTAL HIP ARTHROPLASTY ANTERIOR APPROACH;  Surgeon: Neil Balls, MD;  Location: [facility];  Service: Orthopedics;  Laterality: Right;   TRIGGER FINGER RELEASE  2008   Patient Active Problem List   Diagnosis Date Noted   Pre-operative clearance 05/26/2023   Primary osteoarthritis of left knee 02/11/2020   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   Right arm pain 03/07/2019   Diverticulosis 03/06/2019   CKD (chronic kidney disease), stage III (HCC) 09/06/2018   Allergic rhinitis 08/13/2018   Hypothyroid 02/05/2018   Osteopenia with high risk of fracture 04/26/2017   Bilateral tinnitus 04/25/2017   Restrictive lung disease 04/07/2017   Centrilobular emphysema (HCC) 01/19/2017   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   Trigger ring finger of right hand 08/12/2016   Genital herpes 02/16/2016   Primary osteoarthritis of right hip 07/27/2015   ASCVD (arteriosclerotic cardiovascular disease) 02/05/2015   Rectal bleeding 02/05/2015  Pulmonary nodules 02/05/2015   Glaucoma 01/06/2015   Fibromyalgia 01/06/2015   Elevated cholesterol with elevated triglycerides 01/06/2015   Obstructive sleep apnea on CPAP 01/06/2015   Osteoporosis 01/06/2015    PCP: Dr Duaine German  REFERRING PROVIDER: Dr Neil Balls  REFERRING DIAG: R hip pain s/p R THA  THERAPY DIAG:  Stiffness of right hip, not elsewhere classified  Muscle weakness (generalized)  Other symptoms and signs involving the musculoskeletal system  Cramp and spasm  Pain in right hip  Rationale for Evaluation and Treatment: Rehabilitation  ONSET DATE: 07/03/23 surgery date   SUBJECTIVE:   SUBJECTIVE  STATEMENT: Patient reports she was doing yard work and pulling weeds when she fell forward and landed on her knees; states she was able to get up on her own from quadruped position. Patient denies any injuries, bruising or pain from fall, states her hips are sore.     PERTINENT HISTORY: Hip pathology for > 5 years; COPD; emphysema; cervical disc surgery; arthritis knees and hands  PAIN:  Are you having pain? Yes: NPRS scale: 0/10 Pain location: R hip  Pain description: tenderness; soreness; aching  Aggravating factors: walking; moving around more  Relieving factors: rest   PRECAUTIONS: Anterior hip - no restrictions per pt per MD  WEIGHT BEARING RESTRICTIONS: No  FALLS:  Has patient fallen in last 6 months? No  LIVING ENVIRONMENT: Lives with: lives with their family and lives alone Lives in: House/apartment Stairs: No Has following equipment at home: Single point cane; Environmental consultant - 2 wheeled   OCCUPATION: retired; [employer]; household chores; silver sneakers; church work   PATIENT GOALS: walk without assistive device; return to silver sneakers and home exercise   NEXT MD VISIT: June 2025  OBJECTIVE:  Note: Objective measures were completed at Evaluation unless otherwise noted.  DIAGNOSTIC FINDINGS: 03/02/23 XR IMPRESSION: 1. Severe osteoarthritis of the right hip, substantially progressive from 07/22/2015. 2. Moderate osteoarthritis of the left hip. 3. L4-5 spondylosis.  PATIENT SURVEYS:  LEFS 24/80; 30%   SENSATION: WFL  EDEMA:  Minimal   MUSCLE LENGTH: Hamstrings: Right 55 deg; Left 70 deg Thomas test: Right 0 deg; Left 10 deg  POSTURE: rounded shoulders, forward head, flexed trunk , and weight shift left  PALPATION: Tender anterior hip Scar anterior hip   LOWER EXTREMITY ROM:  Active ROM Right eval Right 08/09/23 Right  08/23/23  Hip flexion 90 110 111  Hip extension 0 5 5  Hip abduction 20 64 64  Hip adduction     Hip internal rotation     Hip  external rotation     Knee flexion 120 132 132  Knee extension 0  0  Ankle dorsiflexion     Ankle plantarflexion     Ankle inversion     Ankle eversion      (Blank rows = not tested)  LOWER EXTREMITY MMT: R not tested resistively - moving well against gravity   L LE - WNL's    MMT Right eval Right  08/23/23  Hip flexion 4 5  Hip extension 4   Hip abduction 4 5  Hip adduction    Hip internal rotation    Hip external rotation    Knee flexion    Knee extension    Ankle dorsiflexion    Ankle plantarflexion    Ankle inversion    Ankle eversion     (Blank rows = not tested)   FUNCTIONAL TESTS:  5 times sit to stand: 13.63 sec use of  UEs on table  08/15/13: 5 times sit to stand 13.61 no use of UE's   GAIT: Distance walked: 40 Assistive device utilized: Environmental consultant - 2 wheeled Level of assistance: Complete Independence Comments: antalgic gait limp R LE   Distance walked: 40 Assistive device utilized: SPC or no assistive device  Level of assistance: Complete Independence Comments: gait pattern is improving with less limp R LE in wt bearing R   [facility] Adult PT Treatment:                                                DATE: 09/06/23 Therapeutic Exercise: Nustep L 6 x 6 min Review of HEP   Therapeutic Activity: Review of HEP  Gait: Ambulation with and without cane  Self Care: Discussed use of cane and safety of walking without assistive device  Discussed falls and getting up from a fall  Instructed in diaphragmatic breathing count of 6 to help control SOB and stress/tension  Breathing in to count of 6; hold to count of 6; breathing out to count of 6    [facility] Adult PT Treatment:                                                DATE: 09/04/2023 Therapeutic Exercise: Seated HS stretch (B) Standing: Hip abd + 2#AW --> 3#AW Hip extension + 2#AW --> 3#AW HS curl + 2#AW --> 3#AW Neuromuscular re-ed: Marching 4x15' Marching + same knee tap 2x15' Marching + opp knee tap  2x15' Therapeutic Activity: Walking without AD x 250' STS from corner of mat table  Marching in place + 3#AW x 30" Side marching + 3#AW at counter for balance as needed Self care: Ice or heat fro hip soreness prn    [facility] Adult PT Treatment:                                                DATE: 08/30/2023 Therapeutic Exercise: Treadmill warm-up: 1.5 mph, 0% incline x 5 min Standing: Hip abd + 3#AW 2x15 (B) Hip extension + 3#AW 2x10 (B) Neuromuscular re-ed: Seated:  Clamshells + blue TB 3x10 LAQ (R) + 4#AW 2x10 Therapeutic Activity: STS x 6 --> added RTB around knees for hip abd stability x 10 Walking without AD x 240'   [facility] Adult PT Treatment:                                                DATE: 08/28/2023 Therapeutic Exercise: Waking on treadmill: 1.2- 1.4 mph --> focus on stride length & equal x 3 min Standing: Resisted hip abduction & extension + RTB x10 each (B) 3-way hip + YTB (B) --> L toe taps on R SLS Supine: LTR x 1 min ITB stretches with strap 2x30"  Hip add stretch with strap x30" Butterfly stretch (hip add) x1 min Seated HS stretch (R) 5x10" Neuromuscular re-ed: Side Lying  Straight leg hip abd x10 Straight leg fwd/bkwd arc lift Marching +  3#AW 2x20                                                                                                                          PATIENT EDUCATION:  Education details: Updated HEP  Person educated: Patient Education method: Explanation, Demonstration, Tactile cues, Verbal cues, and Handouts Education comprehension: verbalized understanding, returned demonstration, verbal cues required, tactile cues required, and needs further education  HOME EXERCISE PROGRAM: Access Code: [email or web address] URL: [email or web address] Date: 09/06/2023 Prepared by: Jensyn Shave  Program Notes Breathing in and out through a straw -one day breath in through a straw, pinching nose as you breath in, hold, then blow out without the  straw  practice 7-10 min/day -day two breath in without straw; place straw in mouth; blow out through a straw, pinching nose as you blow outpractice 7-10 min/day   Exercises - Sit to Stand Without Arm Support  - 1 x daily - 7 x weekly - 3 sets - 10 reps - Standing Hip Extension with Counter Support  - 2 x daily - 7 x weekly - 1-2 sets - 10 reps - 3-5 sec  hold - Standing Hip Abduction with Counter Support  - 1 x daily - 7 x weekly - 2-3 sets - 10 reps - 2-3 sec  hold - Standing Knee Flexion Strengthening at Chair  - 2 x daily - 7 x weekly - 1 sets - 10 reps - 2-3 sec  hold - Standing Hip Flexion  - 2 x daily - 7 x weekly - 1 sets - 10 reps - 2-3 sec  hold - Backward Walking with Counter Support  - 1 x daily - 7 x weekly - 3 sets - 10 reps - Squat with Counter Support  - 1 x daily - 7 x weekly - 3 sets - 10 reps - Supine Diaphragmatic Breathing  - 2 x daily - 7 x weekly - 1 sets - 10 reps - 4-6 sec  hold  ASSESSMENT:  CLINICAL IMPRESSION: Patient reports that she is tired today but otherwise is doing well. She feels confident in continuing HEP. She is walking without cane in home and back and forth to the mailbox. Goals of therapy have been accomplished. Patient will return to Silver Sneakers after discharge from therapy. Patient will be discharged to independent HEP.     OBJECTIVE IMPAIRMENTS: Abnormal gait, decreased activity tolerance, decreased balance, decreased endurance, decreased mobility, difficulty walking, decreased ROM, decreased strength, impaired flexibility, improper body mechanics, postural dysfunction, and pain.    GOALS: Goals reviewed with patient? Yes  SHORT TERM GOALS: Target date: 08/16/2023  Independent in initial HEP  Baseline: Goal status: MET  2.  Independent in basic transfers sit <-> stand; bed mobility Baseline:  Goal status: MET  3.  Ambulation with single point cane with safe, functional gait for in the home Baseline:  Goal status: MET   LONG TERM  GOALS: Target date: 09/13/2023  Independent in all transfers and transitional  activities  Baseline:  Goal status: MET   2.  4+/5 to 5/5 strength R LE  Baseline:  Goal status: MET  3.  Independent gait without assistive device for community distances  Baseline: rolling walker 08/16/23: SPC and without assistive device in home 08/30/23: 240'  Goal status: MET  4.  Return to community based exercise such as silver sneakers 2 days/week  Baseline:  09/04/23: will return when PT finishes  Goal status:    5.  Independent in HEP  Baseline:  Goal status: MET   6.  Improve LEFS by 20% or greater  Baseline: LEFS 24/80; 30%; 08/14/23: 50/80 = 62.5% Goal status: MET   PLAN:  PT FREQUENCY: 2x/week  PT DURATION: 8 weeks  PLANNED INTERVENTIONS: 97110-Therapeutic exercises, 97530- Therapeutic activity, 97112- Neuromuscular re-education, 97535- Self Care, 96045- Manual therapy, [phone]- Gait training, [phone]- Aquatic Therapy, [phone]- Ionotophoresis 4mg /ml Dexamethasone , Patient/Family education, Balance training, Stair training, Taping, Dry Needling, Cryotherapy, and Moist heat  PLAN FOR NEXT SESSION: Discharge to independent HEP   Ladeidra Borys Hadley Leu, PT 09/06/2023, 2:53 PM

## 2023-09-07 ENCOUNTER — Other Ambulatory Visit: Payer: Self-pay | Admitting: Family Medicine

## 2023-09-08 ENCOUNTER — Other Ambulatory Visit: Payer: Self-pay | Admitting: *Deleted

## 2023-09-26 ENCOUNTER — Other Ambulatory Visit: Payer: Self-pay | Admitting: Family Medicine

## 2023-09-26 DIAGNOSIS — M25551 Pain in right hip: Secondary | ICD-10-CM | POA: Diagnosis not present

## 2023-09-26 DIAGNOSIS — M797 Fibromyalgia: Secondary | ICD-10-CM

## 2023-10-05 DIAGNOSIS — G4733 Obstructive sleep apnea (adult) (pediatric): Secondary | ICD-10-CM | POA: Diagnosis not present

## 2023-10-05 DIAGNOSIS — S72142A Displaced intertrochanteric fracture of left femur, initial encounter for closed fracture: Secondary | ICD-10-CM | POA: Diagnosis not present

## 2023-10-05 DIAGNOSIS — N183 Chronic kidney disease, stage 3 unspecified: Secondary | ICD-10-CM | POA: Diagnosis not present

## 2023-10-05 DIAGNOSIS — M25532 Pain in left wrist: Secondary | ICD-10-CM | POA: Diagnosis not present

## 2023-10-05 DIAGNOSIS — M79605 Pain in left leg: Secondary | ICD-10-CM | POA: Diagnosis not present

## 2023-10-05 DIAGNOSIS — J432 Centrilobular emphysema: Secondary | ICD-10-CM | POA: Diagnosis not present

## 2023-10-05 DIAGNOSIS — Z743 Need for continuous supervision: Secondary | ICD-10-CM | POA: Diagnosis not present

## 2023-10-05 DIAGNOSIS — M6281 Muscle weakness (generalized): Secondary | ICD-10-CM | POA: Diagnosis not present

## 2023-10-05 DIAGNOSIS — Z87891 Personal history of nicotine dependence: Secondary | ICD-10-CM | POA: Diagnosis not present

## 2023-10-05 DIAGNOSIS — K219 Gastro-esophageal reflux disease without esophagitis: Secondary | ICD-10-CM | POA: Diagnosis not present

## 2023-10-05 DIAGNOSIS — E039 Hypothyroidism, unspecified: Secondary | ICD-10-CM | POA: Diagnosis not present

## 2023-10-05 DIAGNOSIS — S0990XA Unspecified injury of head, initial encounter: Secondary | ICD-10-CM | POA: Diagnosis not present

## 2023-10-05 DIAGNOSIS — M810 Age-related osteoporosis without current pathological fracture: Secondary | ICD-10-CM | POA: Diagnosis not present

## 2023-10-05 DIAGNOSIS — R9431 Abnormal electrocardiogram [ECG] [EKG]: Secondary | ICD-10-CM | POA: Diagnosis not present

## 2023-10-05 DIAGNOSIS — D649 Anemia, unspecified: Secondary | ICD-10-CM | POA: Diagnosis not present

## 2023-10-05 DIAGNOSIS — K921 Melena: Secondary | ICD-10-CM | POA: Diagnosis not present

## 2023-10-05 DIAGNOSIS — M1990 Unspecified osteoarthritis, unspecified site: Secondary | ICD-10-CM | POA: Diagnosis not present

## 2023-10-05 DIAGNOSIS — J449 Chronic obstructive pulmonary disease, unspecified: Secondary | ICD-10-CM | POA: Diagnosis not present

## 2023-10-05 DIAGNOSIS — N1831 Chronic kidney disease, stage 3a: Secondary | ICD-10-CM | POA: Diagnosis not present

## 2023-10-05 DIAGNOSIS — M6259 Muscle wasting and atrophy, not elsewhere classified, multiple sites: Secondary | ICD-10-CM | POA: Diagnosis not present

## 2023-10-05 DIAGNOSIS — W19XXXA Unspecified fall, initial encounter: Secondary | ICD-10-CM | POA: Diagnosis not present

## 2023-10-05 DIAGNOSIS — S72142D Displaced intertrochanteric fracture of left femur, subsequent encounter for closed fracture with routine healing: Secondary | ICD-10-CM | POA: Diagnosis not present

## 2023-10-05 DIAGNOSIS — M25539 Pain in unspecified wrist: Secondary | ICD-10-CM | POA: Diagnosis not present

## 2023-10-05 DIAGNOSIS — J439 Emphysema, unspecified: Secondary | ICD-10-CM | POA: Diagnosis not present

## 2023-10-05 DIAGNOSIS — S79929A Unspecified injury of unspecified thigh, initial encounter: Secondary | ICD-10-CM | POA: Diagnosis not present

## 2023-10-05 DIAGNOSIS — R2681 Unsteadiness on feet: Secondary | ICD-10-CM | POA: Diagnosis not present

## 2023-10-05 DIAGNOSIS — F32A Depression, unspecified: Secondary | ICD-10-CM | POA: Diagnosis not present

## 2023-10-05 DIAGNOSIS — R296 Repeated falls: Secondary | ICD-10-CM | POA: Diagnosis not present

## 2023-10-05 DIAGNOSIS — D62 Acute posthemorrhagic anemia: Secondary | ICD-10-CM | POA: Diagnosis not present

## 2023-10-05 DIAGNOSIS — H401122 Primary open-angle glaucoma, left eye, moderate stage: Secondary | ICD-10-CM | POA: Diagnosis not present

## 2023-10-05 DIAGNOSIS — J45909 Unspecified asthma, uncomplicated: Secondary | ICD-10-CM | POA: Diagnosis not present

## 2023-10-06 DIAGNOSIS — J439 Emphysema, unspecified: Secondary | ICD-10-CM | POA: Diagnosis not present

## 2023-10-06 DIAGNOSIS — S72142A Displaced intertrochanteric fracture of left femur, initial encounter for closed fracture: Secondary | ICD-10-CM | POA: Diagnosis not present

## 2023-10-06 DIAGNOSIS — G4733 Obstructive sleep apnea (adult) (pediatric): Secondary | ICD-10-CM | POA: Diagnosis not present

## 2023-10-06 DIAGNOSIS — H401122 Primary open-angle glaucoma, left eye, moderate stage: Secondary | ICD-10-CM | POA: Diagnosis not present

## 2023-10-07 DIAGNOSIS — N1831 Chronic kidney disease, stage 3a: Secondary | ICD-10-CM | POA: Diagnosis not present

## 2023-10-07 DIAGNOSIS — E039 Hypothyroidism, unspecified: Secondary | ICD-10-CM | POA: Diagnosis not present

## 2023-10-07 DIAGNOSIS — J432 Centrilobular emphysema: Secondary | ICD-10-CM | POA: Diagnosis not present

## 2023-10-07 DIAGNOSIS — S72142A Displaced intertrochanteric fracture of left femur, initial encounter for closed fracture: Secondary | ICD-10-CM | POA: Diagnosis not present

## 2023-10-07 DIAGNOSIS — K219 Gastro-esophageal reflux disease without esophagitis: Secondary | ICD-10-CM | POA: Diagnosis not present

## 2023-10-07 DIAGNOSIS — J439 Emphysema, unspecified: Secondary | ICD-10-CM | POA: Diagnosis not present

## 2023-10-07 DIAGNOSIS — G4733 Obstructive sleep apnea (adult) (pediatric): Secondary | ICD-10-CM | POA: Diagnosis not present

## 2023-10-08 DIAGNOSIS — J439 Emphysema, unspecified: Secondary | ICD-10-CM | POA: Diagnosis not present

## 2023-10-08 DIAGNOSIS — K219 Gastro-esophageal reflux disease without esophagitis: Secondary | ICD-10-CM | POA: Diagnosis not present

## 2023-10-08 DIAGNOSIS — G4733 Obstructive sleep apnea (adult) (pediatric): Secondary | ICD-10-CM | POA: Diagnosis not present

## 2023-10-08 DIAGNOSIS — N1831 Chronic kidney disease, stage 3a: Secondary | ICD-10-CM | POA: Diagnosis not present

## 2023-10-08 DIAGNOSIS — J432 Centrilobular emphysema: Secondary | ICD-10-CM | POA: Diagnosis not present

## 2023-10-08 DIAGNOSIS — E039 Hypothyroidism, unspecified: Secondary | ICD-10-CM | POA: Diagnosis not present

## 2023-10-08 DIAGNOSIS — S72142A Displaced intertrochanteric fracture of left femur, initial encounter for closed fracture: Secondary | ICD-10-CM | POA: Diagnosis not present

## 2023-10-09 DIAGNOSIS — K219 Gastro-esophageal reflux disease without esophagitis: Secondary | ICD-10-CM | POA: Diagnosis not present

## 2023-10-09 DIAGNOSIS — N1831 Chronic kidney disease, stage 3a: Secondary | ICD-10-CM | POA: Diagnosis not present

## 2023-10-09 DIAGNOSIS — S72142A Displaced intertrochanteric fracture of left femur, initial encounter for closed fracture: Secondary | ICD-10-CM | POA: Diagnosis not present

## 2023-10-09 DIAGNOSIS — J439 Emphysema, unspecified: Secondary | ICD-10-CM | POA: Diagnosis not present

## 2023-10-09 DIAGNOSIS — G4733 Obstructive sleep apnea (adult) (pediatric): Secondary | ICD-10-CM | POA: Diagnosis not present

## 2023-10-09 DIAGNOSIS — J432 Centrilobular emphysema: Secondary | ICD-10-CM | POA: Diagnosis not present

## 2023-10-09 DIAGNOSIS — E039 Hypothyroidism, unspecified: Secondary | ICD-10-CM | POA: Diagnosis not present

## 2023-10-10 DIAGNOSIS — J439 Emphysema, unspecified: Secondary | ICD-10-CM | POA: Diagnosis not present

## 2023-10-10 DIAGNOSIS — D649 Anemia, unspecified: Secondary | ICD-10-CM | POA: Diagnosis not present

## 2023-10-10 DIAGNOSIS — S72142A Displaced intertrochanteric fracture of left femur, initial encounter for closed fracture: Secondary | ICD-10-CM | POA: Diagnosis not present

## 2023-10-13 DIAGNOSIS — M6259 Muscle wasting and atrophy, not elsewhere classified, multiple sites: Secondary | ICD-10-CM | POA: Diagnosis not present

## 2023-10-13 DIAGNOSIS — Z743 Need for continuous supervision: Secondary | ICD-10-CM | POA: Diagnosis not present

## 2023-10-13 DIAGNOSIS — R2681 Unsteadiness on feet: Secondary | ICD-10-CM | POA: Diagnosis not present

## 2023-10-13 DIAGNOSIS — S72142A Displaced intertrochanteric fracture of left femur, initial encounter for closed fracture: Secondary | ICD-10-CM | POA: Diagnosis not present

## 2023-10-13 DIAGNOSIS — G4733 Obstructive sleep apnea (adult) (pediatric): Secondary | ICD-10-CM | POA: Diagnosis not present

## 2023-10-13 DIAGNOSIS — J439 Emphysema, unspecified: Secondary | ICD-10-CM | POA: Diagnosis not present

## 2023-10-13 DIAGNOSIS — F32A Depression, unspecified: Secondary | ICD-10-CM | POA: Diagnosis not present

## 2023-10-13 DIAGNOSIS — J449 Chronic obstructive pulmonary disease, unspecified: Secondary | ICD-10-CM | POA: Diagnosis not present

## 2023-10-13 DIAGNOSIS — J432 Centrilobular emphysema: Secondary | ICD-10-CM | POA: Diagnosis not present

## 2023-10-13 DIAGNOSIS — M6281 Muscle weakness (generalized): Secondary | ICD-10-CM | POA: Diagnosis not present

## 2023-10-13 DIAGNOSIS — D649 Anemia, unspecified: Secondary | ICD-10-CM | POA: Diagnosis not present

## 2023-10-13 DIAGNOSIS — S79929A Unspecified injury of unspecified thigh, initial encounter: Secondary | ICD-10-CM | POA: Diagnosis not present

## 2023-10-13 DIAGNOSIS — S72142D Displaced intertrochanteric fracture of left femur, subsequent encounter for closed fracture with routine healing: Secondary | ICD-10-CM | POA: Diagnosis not present

## 2023-10-13 DIAGNOSIS — N183 Chronic kidney disease, stage 3 unspecified: Secondary | ICD-10-CM | POA: Diagnosis not present

## 2023-10-16 DIAGNOSIS — M6281 Muscle weakness (generalized): Secondary | ICD-10-CM | POA: Diagnosis not present

## 2023-10-16 DIAGNOSIS — M6259 Muscle wasting and atrophy, not elsewhere classified, multiple sites: Secondary | ICD-10-CM | POA: Diagnosis not present

## 2023-10-16 DIAGNOSIS — S72142D Displaced intertrochanteric fracture of left femur, subsequent encounter for closed fracture with routine healing: Secondary | ICD-10-CM | POA: Diagnosis not present

## 2023-10-16 DIAGNOSIS — R2681 Unsteadiness on feet: Secondary | ICD-10-CM | POA: Diagnosis not present

## 2023-10-16 DIAGNOSIS — N183 Chronic kidney disease, stage 3 unspecified: Secondary | ICD-10-CM | POA: Diagnosis not present

## 2023-10-16 DIAGNOSIS — J432 Centrilobular emphysema: Secondary | ICD-10-CM | POA: Diagnosis not present

## 2023-10-16 DIAGNOSIS — J449 Chronic obstructive pulmonary disease, unspecified: Secondary | ICD-10-CM | POA: Diagnosis not present

## 2023-10-16 DIAGNOSIS — F32A Depression, unspecified: Secondary | ICD-10-CM | POA: Diagnosis not present

## 2023-10-16 DIAGNOSIS — G4733 Obstructive sleep apnea (adult) (pediatric): Secondary | ICD-10-CM | POA: Diagnosis not present

## 2023-10-18 DIAGNOSIS — S72142D Displaced intertrochanteric fracture of left femur, subsequent encounter for closed fracture with routine healing: Secondary | ICD-10-CM | POA: Diagnosis not present

## 2023-10-18 DIAGNOSIS — G4733 Obstructive sleep apnea (adult) (pediatric): Secondary | ICD-10-CM | POA: Diagnosis not present

## 2023-10-18 DIAGNOSIS — R2681 Unsteadiness on feet: Secondary | ICD-10-CM | POA: Diagnosis not present

## 2023-10-18 DIAGNOSIS — J432 Centrilobular emphysema: Secondary | ICD-10-CM | POA: Diagnosis not present

## 2023-10-18 DIAGNOSIS — F32A Depression, unspecified: Secondary | ICD-10-CM | POA: Diagnosis not present

## 2023-10-18 DIAGNOSIS — J449 Chronic obstructive pulmonary disease, unspecified: Secondary | ICD-10-CM | POA: Diagnosis not present

## 2023-10-18 DIAGNOSIS — N183 Chronic kidney disease, stage 3 unspecified: Secondary | ICD-10-CM | POA: Diagnosis not present

## 2023-10-18 DIAGNOSIS — M6281 Muscle weakness (generalized): Secondary | ICD-10-CM | POA: Diagnosis not present

## 2023-10-18 DIAGNOSIS — M6259 Muscle wasting and atrophy, not elsewhere classified, multiple sites: Secondary | ICD-10-CM | POA: Diagnosis not present

## 2023-10-20 DIAGNOSIS — R2681 Unsteadiness on feet: Secondary | ICD-10-CM | POA: Diagnosis not present

## 2023-10-20 DIAGNOSIS — S72142D Displaced intertrochanteric fracture of left femur, subsequent encounter for closed fracture with routine healing: Secondary | ICD-10-CM | POA: Diagnosis not present

## 2023-10-20 DIAGNOSIS — N183 Chronic kidney disease, stage 3 unspecified: Secondary | ICD-10-CM | POA: Diagnosis not present

## 2023-10-20 DIAGNOSIS — G4733 Obstructive sleep apnea (adult) (pediatric): Secondary | ICD-10-CM | POA: Diagnosis not present

## 2023-10-20 DIAGNOSIS — J449 Chronic obstructive pulmonary disease, unspecified: Secondary | ICD-10-CM | POA: Diagnosis not present

## 2023-10-20 DIAGNOSIS — M6259 Muscle wasting and atrophy, not elsewhere classified, multiple sites: Secondary | ICD-10-CM | POA: Diagnosis not present

## 2023-10-20 DIAGNOSIS — M6281 Muscle weakness (generalized): Secondary | ICD-10-CM | POA: Diagnosis not present

## 2023-10-20 DIAGNOSIS — J432 Centrilobular emphysema: Secondary | ICD-10-CM | POA: Diagnosis not present

## 2023-10-20 DIAGNOSIS — F32A Depression, unspecified: Secondary | ICD-10-CM | POA: Diagnosis not present

## 2023-10-23 DIAGNOSIS — R2681 Unsteadiness on feet: Secondary | ICD-10-CM | POA: Diagnosis not present

## 2023-10-23 DIAGNOSIS — F32A Depression, unspecified: Secondary | ICD-10-CM | POA: Diagnosis not present

## 2023-10-23 DIAGNOSIS — M6259 Muscle wasting and atrophy, not elsewhere classified, multiple sites: Secondary | ICD-10-CM | POA: Diagnosis not present

## 2023-10-23 DIAGNOSIS — M6281 Muscle weakness (generalized): Secondary | ICD-10-CM | POA: Diagnosis not present

## 2023-10-23 DIAGNOSIS — J449 Chronic obstructive pulmonary disease, unspecified: Secondary | ICD-10-CM | POA: Diagnosis not present

## 2023-10-23 DIAGNOSIS — N183 Chronic kidney disease, stage 3 unspecified: Secondary | ICD-10-CM | POA: Diagnosis not present

## 2023-10-23 DIAGNOSIS — G4733 Obstructive sleep apnea (adult) (pediatric): Secondary | ICD-10-CM | POA: Diagnosis not present

## 2023-10-23 DIAGNOSIS — S72142D Displaced intertrochanteric fracture of left femur, subsequent encounter for closed fracture with routine healing: Secondary | ICD-10-CM | POA: Diagnosis not present

## 2023-10-23 DIAGNOSIS — J432 Centrilobular emphysema: Secondary | ICD-10-CM | POA: Diagnosis not present

## 2023-10-25 DIAGNOSIS — N183 Chronic kidney disease, stage 3 unspecified: Secondary | ICD-10-CM | POA: Diagnosis not present

## 2023-10-25 DIAGNOSIS — M6281 Muscle weakness (generalized): Secondary | ICD-10-CM | POA: Diagnosis not present

## 2023-10-25 DIAGNOSIS — J432 Centrilobular emphysema: Secondary | ICD-10-CM | POA: Diagnosis not present

## 2023-10-25 DIAGNOSIS — G4733 Obstructive sleep apnea (adult) (pediatric): Secondary | ICD-10-CM | POA: Diagnosis not present

## 2023-10-25 DIAGNOSIS — J449 Chronic obstructive pulmonary disease, unspecified: Secondary | ICD-10-CM | POA: Diagnosis not present

## 2023-10-25 DIAGNOSIS — R2681 Unsteadiness on feet: Secondary | ICD-10-CM | POA: Diagnosis not present

## 2023-10-25 DIAGNOSIS — M6259 Muscle wasting and atrophy, not elsewhere classified, multiple sites: Secondary | ICD-10-CM | POA: Diagnosis not present

## 2023-10-25 DIAGNOSIS — F32A Depression, unspecified: Secondary | ICD-10-CM | POA: Diagnosis not present

## 2023-10-25 DIAGNOSIS — S72142D Displaced intertrochanteric fracture of left femur, subsequent encounter for closed fracture with routine healing: Secondary | ICD-10-CM | POA: Diagnosis not present

## 2023-10-26 DIAGNOSIS — J432 Centrilobular emphysema: Secondary | ICD-10-CM | POA: Diagnosis not present

## 2023-10-26 DIAGNOSIS — S72142D Displaced intertrochanteric fracture of left femur, subsequent encounter for closed fracture with routine healing: Secondary | ICD-10-CM | POA: Diagnosis not present

## 2023-10-26 DIAGNOSIS — F32A Depression, unspecified: Secondary | ICD-10-CM | POA: Diagnosis not present

## 2023-10-26 DIAGNOSIS — M6281 Muscle weakness (generalized): Secondary | ICD-10-CM | POA: Diagnosis not present

## 2023-10-26 DIAGNOSIS — N183 Chronic kidney disease, stage 3 unspecified: Secondary | ICD-10-CM | POA: Diagnosis not present

## 2023-10-26 DIAGNOSIS — J449 Chronic obstructive pulmonary disease, unspecified: Secondary | ICD-10-CM | POA: Diagnosis not present

## 2023-10-26 DIAGNOSIS — G4733 Obstructive sleep apnea (adult) (pediatric): Secondary | ICD-10-CM | POA: Diagnosis not present

## 2023-10-26 DIAGNOSIS — R2681 Unsteadiness on feet: Secondary | ICD-10-CM | POA: Diagnosis not present

## 2023-10-26 DIAGNOSIS — M6259 Muscle wasting and atrophy, not elsewhere classified, multiple sites: Secondary | ICD-10-CM | POA: Diagnosis not present

## 2023-10-27 DIAGNOSIS — M6281 Muscle weakness (generalized): Secondary | ICD-10-CM | POA: Diagnosis not present

## 2023-10-27 DIAGNOSIS — S72142D Displaced intertrochanteric fracture of left femur, subsequent encounter for closed fracture with routine healing: Secondary | ICD-10-CM | POA: Diagnosis not present

## 2023-10-27 DIAGNOSIS — J432 Centrilobular emphysema: Secondary | ICD-10-CM | POA: Diagnosis not present

## 2023-10-27 DIAGNOSIS — R2681 Unsteadiness on feet: Secondary | ICD-10-CM | POA: Diagnosis not present

## 2023-10-27 DIAGNOSIS — J449 Chronic obstructive pulmonary disease, unspecified: Secondary | ICD-10-CM | POA: Diagnosis not present

## 2023-10-27 DIAGNOSIS — F32A Depression, unspecified: Secondary | ICD-10-CM | POA: Diagnosis not present

## 2023-10-27 DIAGNOSIS — N183 Chronic kidney disease, stage 3 unspecified: Secondary | ICD-10-CM | POA: Diagnosis not present

## 2023-10-27 DIAGNOSIS — M6259 Muscle wasting and atrophy, not elsewhere classified, multiple sites: Secondary | ICD-10-CM | POA: Diagnosis not present

## 2023-10-27 DIAGNOSIS — G4733 Obstructive sleep apnea (adult) (pediatric): Secondary | ICD-10-CM | POA: Diagnosis not present

## 2023-10-30 DIAGNOSIS — D62 Acute posthemorrhagic anemia: Secondary | ICD-10-CM | POA: Diagnosis not present

## 2023-10-30 DIAGNOSIS — E785 Hyperlipidemia, unspecified: Secondary | ICD-10-CM | POA: Diagnosis not present

## 2023-10-30 DIAGNOSIS — S72142D Displaced intertrochanteric fracture of left femur, subsequent encounter for closed fracture with routine healing: Secondary | ICD-10-CM | POA: Diagnosis not present

## 2023-10-30 DIAGNOSIS — M6259 Muscle wasting and atrophy, not elsewhere classified, multiple sites: Secondary | ICD-10-CM | POA: Diagnosis not present

## 2023-10-30 DIAGNOSIS — F32A Depression, unspecified: Secondary | ICD-10-CM | POA: Diagnosis not present

## 2023-10-30 DIAGNOSIS — J432 Centrilobular emphysema: Secondary | ICD-10-CM | POA: Diagnosis not present

## 2023-10-30 DIAGNOSIS — N183 Chronic kidney disease, stage 3 unspecified: Secondary | ICD-10-CM | POA: Diagnosis not present

## 2023-10-30 DIAGNOSIS — E039 Hypothyroidism, unspecified: Secondary | ICD-10-CM | POA: Diagnosis not present

## 2023-10-30 DIAGNOSIS — M6281 Muscle weakness (generalized): Secondary | ICD-10-CM | POA: Diagnosis not present

## 2023-10-30 DIAGNOSIS — G4733 Obstructive sleep apnea (adult) (pediatric): Secondary | ICD-10-CM | POA: Diagnosis not present

## 2023-10-31 DIAGNOSIS — M25552 Pain in left hip: Secondary | ICD-10-CM | POA: Diagnosis not present

## 2023-11-01 DIAGNOSIS — E039 Hypothyroidism, unspecified: Secondary | ICD-10-CM | POA: Diagnosis not present

## 2023-11-01 DIAGNOSIS — S72142D Displaced intertrochanteric fracture of left femur, subsequent encounter for closed fracture with routine healing: Secondary | ICD-10-CM | POA: Diagnosis not present

## 2023-11-01 DIAGNOSIS — G4733 Obstructive sleep apnea (adult) (pediatric): Secondary | ICD-10-CM | POA: Diagnosis not present

## 2023-11-01 DIAGNOSIS — F32A Depression, unspecified: Secondary | ICD-10-CM | POA: Diagnosis not present

## 2023-11-01 DIAGNOSIS — J432 Centrilobular emphysema: Secondary | ICD-10-CM | POA: Diagnosis not present

## 2023-11-01 DIAGNOSIS — D62 Acute posthemorrhagic anemia: Secondary | ICD-10-CM | POA: Diagnosis not present

## 2023-11-01 DIAGNOSIS — E785 Hyperlipidemia, unspecified: Secondary | ICD-10-CM | POA: Diagnosis not present

## 2023-11-01 DIAGNOSIS — M6259 Muscle wasting and atrophy, not elsewhere classified, multiple sites: Secondary | ICD-10-CM | POA: Diagnosis not present

## 2023-11-01 DIAGNOSIS — N183 Chronic kidney disease, stage 3 unspecified: Secondary | ICD-10-CM | POA: Diagnosis not present

## 2023-11-02 DIAGNOSIS — D62 Acute posthemorrhagic anemia: Secondary | ICD-10-CM | POA: Diagnosis not present

## 2023-11-02 DIAGNOSIS — G4733 Obstructive sleep apnea (adult) (pediatric): Secondary | ICD-10-CM | POA: Diagnosis not present

## 2023-11-02 DIAGNOSIS — S72142D Displaced intertrochanteric fracture of left femur, subsequent encounter for closed fracture with routine healing: Secondary | ICD-10-CM | POA: Diagnosis not present

## 2023-11-02 DIAGNOSIS — E039 Hypothyroidism, unspecified: Secondary | ICD-10-CM | POA: Diagnosis not present

## 2023-11-02 DIAGNOSIS — J432 Centrilobular emphysema: Secondary | ICD-10-CM | POA: Diagnosis not present

## 2023-11-02 DIAGNOSIS — M6259 Muscle wasting and atrophy, not elsewhere classified, multiple sites: Secondary | ICD-10-CM | POA: Diagnosis not present

## 2023-11-02 DIAGNOSIS — F32A Depression, unspecified: Secondary | ICD-10-CM | POA: Diagnosis not present

## 2023-11-02 DIAGNOSIS — N183 Chronic kidney disease, stage 3 unspecified: Secondary | ICD-10-CM | POA: Diagnosis not present

## 2023-11-02 DIAGNOSIS — E785 Hyperlipidemia, unspecified: Secondary | ICD-10-CM | POA: Diagnosis not present

## 2023-11-06 ENCOUNTER — Telehealth: Payer: Self-pay | Admitting: Family Medicine

## 2023-11-06 DIAGNOSIS — E785 Hyperlipidemia, unspecified: Secondary | ICD-10-CM | POA: Diagnosis not present

## 2023-11-06 DIAGNOSIS — M6259 Muscle wasting and atrophy, not elsewhere classified, multiple sites: Secondary | ICD-10-CM | POA: Diagnosis not present

## 2023-11-06 DIAGNOSIS — J432 Centrilobular emphysema: Secondary | ICD-10-CM | POA: Diagnosis not present

## 2023-11-06 DIAGNOSIS — D62 Acute posthemorrhagic anemia: Secondary | ICD-10-CM | POA: Diagnosis not present

## 2023-11-06 DIAGNOSIS — G4733 Obstructive sleep apnea (adult) (pediatric): Secondary | ICD-10-CM | POA: Diagnosis not present

## 2023-11-06 DIAGNOSIS — N183 Chronic kidney disease, stage 3 unspecified: Secondary | ICD-10-CM | POA: Diagnosis not present

## 2023-11-06 DIAGNOSIS — S72142D Displaced intertrochanteric fracture of left femur, subsequent encounter for closed fracture with routine healing: Secondary | ICD-10-CM | POA: Diagnosis not present

## 2023-11-06 DIAGNOSIS — F32A Depression, unspecified: Secondary | ICD-10-CM | POA: Diagnosis not present

## 2023-11-06 DIAGNOSIS — E039 Hypothyroidism, unspecified: Secondary | ICD-10-CM | POA: Diagnosis not present

## 2023-11-06 NOTE — Telephone Encounter (Signed)
 Please tell her I am so sorry about her hip fracture.  We need to put her on a bone builder for her bones so she doesn't fracture again.  Would she be open to that .  Fosamax of Boniva  - pills Or prolia shot twice a year  Recommendation for best tx is evenity montly for one year and then switch to prolia twice a year

## 2023-11-06 NOTE — Telephone Encounter (Signed)
 Spoke  with patient. States she is in process of dental implants - had to place this on  hold due  the fracture. She does not want to proceed with any options offered as she is concerned over jaw bone degeneration related to them - she has spent over $50,000 on dental care and does not want to risk this-states doctors have been trying to get her to start these for years. She states this is her first fracture.

## 2023-11-08 DIAGNOSIS — S72142D Displaced intertrochanteric fracture of left femur, subsequent encounter for closed fracture with routine healing: Secondary | ICD-10-CM | POA: Diagnosis not present

## 2023-11-08 DIAGNOSIS — N183 Chronic kidney disease, stage 3 unspecified: Secondary | ICD-10-CM | POA: Diagnosis not present

## 2023-11-08 DIAGNOSIS — J432 Centrilobular emphysema: Secondary | ICD-10-CM | POA: Diagnosis not present

## 2023-11-08 DIAGNOSIS — G4733 Obstructive sleep apnea (adult) (pediatric): Secondary | ICD-10-CM | POA: Diagnosis not present

## 2023-11-08 DIAGNOSIS — M6259 Muscle wasting and atrophy, not elsewhere classified, multiple sites: Secondary | ICD-10-CM | POA: Diagnosis not present

## 2023-11-08 DIAGNOSIS — E039 Hypothyroidism, unspecified: Secondary | ICD-10-CM | POA: Diagnosis not present

## 2023-11-08 DIAGNOSIS — D62 Acute posthemorrhagic anemia: Secondary | ICD-10-CM | POA: Diagnosis not present

## 2023-11-08 DIAGNOSIS — F32A Depression, unspecified: Secondary | ICD-10-CM | POA: Diagnosis not present

## 2023-11-08 DIAGNOSIS — E785 Hyperlipidemia, unspecified: Secondary | ICD-10-CM | POA: Diagnosis not present

## 2023-11-09 DIAGNOSIS — F32A Depression, unspecified: Secondary | ICD-10-CM | POA: Diagnosis not present

## 2023-11-09 DIAGNOSIS — D62 Acute posthemorrhagic anemia: Secondary | ICD-10-CM | POA: Diagnosis not present

## 2023-11-09 DIAGNOSIS — N183 Chronic kidney disease, stage 3 unspecified: Secondary | ICD-10-CM | POA: Diagnosis not present

## 2023-11-10 DIAGNOSIS — N183 Chronic kidney disease, stage 3 unspecified: Secondary | ICD-10-CM | POA: Diagnosis not present

## 2023-11-10 DIAGNOSIS — E785 Hyperlipidemia, unspecified: Secondary | ICD-10-CM | POA: Diagnosis not present

## 2023-11-10 DIAGNOSIS — F32A Depression, unspecified: Secondary | ICD-10-CM | POA: Diagnosis not present

## 2023-11-10 DIAGNOSIS — J432 Centrilobular emphysema: Secondary | ICD-10-CM | POA: Diagnosis not present

## 2023-11-10 DIAGNOSIS — S72142D Displaced intertrochanteric fracture of left femur, subsequent encounter for closed fracture with routine healing: Secondary | ICD-10-CM | POA: Diagnosis not present

## 2023-11-10 DIAGNOSIS — D62 Acute posthemorrhagic anemia: Secondary | ICD-10-CM | POA: Diagnosis not present

## 2023-11-10 DIAGNOSIS — E039 Hypothyroidism, unspecified: Secondary | ICD-10-CM | POA: Diagnosis not present

## 2023-11-10 DIAGNOSIS — G4733 Obstructive sleep apnea (adult) (pediatric): Secondary | ICD-10-CM | POA: Diagnosis not present

## 2023-11-10 DIAGNOSIS — M6259 Muscle wasting and atrophy, not elsewhere classified, multiple sites: Secondary | ICD-10-CM | POA: Diagnosis not present

## 2023-11-13 DIAGNOSIS — S72142D Displaced intertrochanteric fracture of left femur, subsequent encounter for closed fracture with routine healing: Secondary | ICD-10-CM | POA: Diagnosis not present

## 2023-11-13 DIAGNOSIS — N183 Chronic kidney disease, stage 3 unspecified: Secondary | ICD-10-CM | POA: Diagnosis not present

## 2023-11-13 DIAGNOSIS — M6259 Muscle wasting and atrophy, not elsewhere classified, multiple sites: Secondary | ICD-10-CM | POA: Diagnosis not present

## 2023-11-13 DIAGNOSIS — J432 Centrilobular emphysema: Secondary | ICD-10-CM | POA: Diagnosis not present

## 2023-11-13 DIAGNOSIS — D62 Acute posthemorrhagic anemia: Secondary | ICD-10-CM | POA: Diagnosis not present

## 2023-11-13 DIAGNOSIS — F32A Depression, unspecified: Secondary | ICD-10-CM | POA: Diagnosis not present

## 2023-11-13 DIAGNOSIS — E039 Hypothyroidism, unspecified: Secondary | ICD-10-CM | POA: Diagnosis not present

## 2023-11-13 DIAGNOSIS — E785 Hyperlipidemia, unspecified: Secondary | ICD-10-CM | POA: Diagnosis not present

## 2023-11-13 DIAGNOSIS — G4733 Obstructive sleep apnea (adult) (pediatric): Secondary | ICD-10-CM | POA: Diagnosis not present

## 2023-11-14 DIAGNOSIS — G4733 Obstructive sleep apnea (adult) (pediatric): Secondary | ICD-10-CM | POA: Diagnosis not present

## 2023-11-14 DIAGNOSIS — F32A Depression, unspecified: Secondary | ICD-10-CM | POA: Diagnosis not present

## 2023-11-14 DIAGNOSIS — M6259 Muscle wasting and atrophy, not elsewhere classified, multiple sites: Secondary | ICD-10-CM | POA: Diagnosis not present

## 2023-11-14 DIAGNOSIS — E785 Hyperlipidemia, unspecified: Secondary | ICD-10-CM | POA: Diagnosis not present

## 2023-11-14 DIAGNOSIS — D62 Acute posthemorrhagic anemia: Secondary | ICD-10-CM | POA: Diagnosis not present

## 2023-11-14 DIAGNOSIS — E039 Hypothyroidism, unspecified: Secondary | ICD-10-CM | POA: Diagnosis not present

## 2023-11-14 DIAGNOSIS — S72142D Displaced intertrochanteric fracture of left femur, subsequent encounter for closed fracture with routine healing: Secondary | ICD-10-CM | POA: Diagnosis not present

## 2023-11-14 DIAGNOSIS — N183 Chronic kidney disease, stage 3 unspecified: Secondary | ICD-10-CM | POA: Diagnosis not present

## 2023-11-14 DIAGNOSIS — J432 Centrilobular emphysema: Secondary | ICD-10-CM | POA: Diagnosis not present

## 2023-11-20 DIAGNOSIS — J432 Centrilobular emphysema: Secondary | ICD-10-CM | POA: Diagnosis not present

## 2023-11-20 DIAGNOSIS — F32A Depression, unspecified: Secondary | ICD-10-CM | POA: Diagnosis not present

## 2023-11-20 DIAGNOSIS — E785 Hyperlipidemia, unspecified: Secondary | ICD-10-CM | POA: Diagnosis not present

## 2023-11-20 DIAGNOSIS — S72142D Displaced intertrochanteric fracture of left femur, subsequent encounter for closed fracture with routine healing: Secondary | ICD-10-CM | POA: Diagnosis not present

## 2023-11-20 DIAGNOSIS — D62 Acute posthemorrhagic anemia: Secondary | ICD-10-CM | POA: Diagnosis not present

## 2023-11-20 DIAGNOSIS — M6259 Muscle wasting and atrophy, not elsewhere classified, multiple sites: Secondary | ICD-10-CM | POA: Diagnosis not present

## 2023-11-20 DIAGNOSIS — G4733 Obstructive sleep apnea (adult) (pediatric): Secondary | ICD-10-CM | POA: Diagnosis not present

## 2023-11-20 DIAGNOSIS — E039 Hypothyroidism, unspecified: Secondary | ICD-10-CM | POA: Diagnosis not present

## 2023-11-20 DIAGNOSIS — N183 Chronic kidney disease, stage 3 unspecified: Secondary | ICD-10-CM | POA: Diagnosis not present

## 2023-11-21 DIAGNOSIS — M25552 Pain in left hip: Secondary | ICD-10-CM | POA: Diagnosis not present

## 2023-11-23 DIAGNOSIS — D62 Acute posthemorrhagic anemia: Secondary | ICD-10-CM | POA: Diagnosis not present

## 2023-11-23 DIAGNOSIS — E785 Hyperlipidemia, unspecified: Secondary | ICD-10-CM | POA: Diagnosis not present

## 2023-11-23 DIAGNOSIS — N183 Chronic kidney disease, stage 3 unspecified: Secondary | ICD-10-CM | POA: Diagnosis not present

## 2023-11-23 DIAGNOSIS — F32A Depression, unspecified: Secondary | ICD-10-CM | POA: Diagnosis not present

## 2023-11-23 DIAGNOSIS — E039 Hypothyroidism, unspecified: Secondary | ICD-10-CM | POA: Diagnosis not present

## 2023-11-23 DIAGNOSIS — M6259 Muscle wasting and atrophy, not elsewhere classified, multiple sites: Secondary | ICD-10-CM | POA: Diagnosis not present

## 2023-11-23 DIAGNOSIS — S72142D Displaced intertrochanteric fracture of left femur, subsequent encounter for closed fracture with routine healing: Secondary | ICD-10-CM | POA: Diagnosis not present

## 2023-11-23 DIAGNOSIS — G4733 Obstructive sleep apnea (adult) (pediatric): Secondary | ICD-10-CM | POA: Diagnosis not present

## 2023-11-23 DIAGNOSIS — J432 Centrilobular emphysema: Secondary | ICD-10-CM | POA: Diagnosis not present

## 2023-11-30 DIAGNOSIS — E039 Hypothyroidism, unspecified: Secondary | ICD-10-CM | POA: Diagnosis not present

## 2023-11-30 DIAGNOSIS — M6259 Muscle wasting and atrophy, not elsewhere classified, multiple sites: Secondary | ICD-10-CM | POA: Diagnosis not present

## 2023-11-30 DIAGNOSIS — S72142D Displaced intertrochanteric fracture of left femur, subsequent encounter for closed fracture with routine healing: Secondary | ICD-10-CM | POA: Diagnosis not present

## 2023-11-30 DIAGNOSIS — J432 Centrilobular emphysema: Secondary | ICD-10-CM | POA: Diagnosis not present

## 2023-11-30 DIAGNOSIS — D62 Acute posthemorrhagic anemia: Secondary | ICD-10-CM | POA: Diagnosis not present

## 2023-11-30 DIAGNOSIS — N183 Chronic kidney disease, stage 3 unspecified: Secondary | ICD-10-CM | POA: Diagnosis not present

## 2023-11-30 DIAGNOSIS — G4733 Obstructive sleep apnea (adult) (pediatric): Secondary | ICD-10-CM | POA: Diagnosis not present

## 2023-11-30 DIAGNOSIS — F32A Depression, unspecified: Secondary | ICD-10-CM | POA: Diagnosis not present

## 2023-11-30 DIAGNOSIS — E785 Hyperlipidemia, unspecified: Secondary | ICD-10-CM | POA: Diagnosis not present

## 2023-12-05 DIAGNOSIS — F32A Depression, unspecified: Secondary | ICD-10-CM | POA: Diagnosis not present

## 2023-12-05 DIAGNOSIS — G4733 Obstructive sleep apnea (adult) (pediatric): Secondary | ICD-10-CM | POA: Diagnosis not present

## 2023-12-05 DIAGNOSIS — E785 Hyperlipidemia, unspecified: Secondary | ICD-10-CM | POA: Diagnosis not present

## 2023-12-05 DIAGNOSIS — D62 Acute posthemorrhagic anemia: Secondary | ICD-10-CM | POA: Diagnosis not present

## 2023-12-05 DIAGNOSIS — M6259 Muscle wasting and atrophy, not elsewhere classified, multiple sites: Secondary | ICD-10-CM | POA: Diagnosis not present

## 2023-12-05 DIAGNOSIS — J432 Centrilobular emphysema: Secondary | ICD-10-CM | POA: Diagnosis not present

## 2023-12-05 DIAGNOSIS — E039 Hypothyroidism, unspecified: Secondary | ICD-10-CM | POA: Diagnosis not present

## 2023-12-05 DIAGNOSIS — N183 Chronic kidney disease, stage 3 unspecified: Secondary | ICD-10-CM | POA: Diagnosis not present

## 2023-12-05 DIAGNOSIS — S72142D Displaced intertrochanteric fracture of left femur, subsequent encounter for closed fracture with routine healing: Secondary | ICD-10-CM | POA: Diagnosis not present

## 2023-12-08 DIAGNOSIS — F32A Depression, unspecified: Secondary | ICD-10-CM | POA: Diagnosis not present

## 2023-12-08 DIAGNOSIS — E785 Hyperlipidemia, unspecified: Secondary | ICD-10-CM | POA: Diagnosis not present

## 2023-12-08 DIAGNOSIS — E039 Hypothyroidism, unspecified: Secondary | ICD-10-CM | POA: Diagnosis not present

## 2023-12-08 DIAGNOSIS — D62 Acute posthemorrhagic anemia: Secondary | ICD-10-CM | POA: Diagnosis not present

## 2023-12-08 DIAGNOSIS — S72142D Displaced intertrochanteric fracture of left femur, subsequent encounter for closed fracture with routine healing: Secondary | ICD-10-CM | POA: Diagnosis not present

## 2023-12-08 DIAGNOSIS — N183 Chronic kidney disease, stage 3 unspecified: Secondary | ICD-10-CM | POA: Diagnosis not present

## 2023-12-08 DIAGNOSIS — J432 Centrilobular emphysema: Secondary | ICD-10-CM | POA: Diagnosis not present

## 2023-12-08 DIAGNOSIS — M6259 Muscle wasting and atrophy, not elsewhere classified, multiple sites: Secondary | ICD-10-CM | POA: Diagnosis not present

## 2023-12-08 DIAGNOSIS — G4733 Obstructive sleep apnea (adult) (pediatric): Secondary | ICD-10-CM | POA: Diagnosis not present

## 2023-12-11 ENCOUNTER — Telehealth: Payer: Self-pay | Admitting: Family Medicine

## 2023-12-11 DIAGNOSIS — D62 Acute posthemorrhagic anemia: Secondary | ICD-10-CM | POA: Diagnosis not present

## 2023-12-11 DIAGNOSIS — E785 Hyperlipidemia, unspecified: Secondary | ICD-10-CM | POA: Diagnosis not present

## 2023-12-11 DIAGNOSIS — F32A Depression, unspecified: Secondary | ICD-10-CM | POA: Diagnosis not present

## 2023-12-11 DIAGNOSIS — E039 Hypothyroidism, unspecified: Secondary | ICD-10-CM | POA: Diagnosis not present

## 2023-12-11 DIAGNOSIS — N183 Chronic kidney disease, stage 3 unspecified: Secondary | ICD-10-CM | POA: Diagnosis not present

## 2023-12-11 DIAGNOSIS — M6259 Muscle wasting and atrophy, not elsewhere classified, multiple sites: Secondary | ICD-10-CM | POA: Diagnosis not present

## 2023-12-11 DIAGNOSIS — J432 Centrilobular emphysema: Secondary | ICD-10-CM | POA: Diagnosis not present

## 2023-12-11 DIAGNOSIS — S72142D Displaced intertrochanteric fracture of left femur, subsequent encounter for closed fracture with routine healing: Secondary | ICD-10-CM | POA: Diagnosis not present

## 2023-12-11 DIAGNOSIS — G4733 Obstructive sleep apnea (adult) (pediatric): Secondary | ICD-10-CM | POA: Diagnosis not present

## 2023-12-11 NOTE — Telephone Encounter (Unsigned)
 Copied from CRM [id number]. Topic: General - Other >> Dec 11, 2023 12:23 PM Mercer PEDLAR wrote: Reason for CRM: Katlyn calling from [facility] to confirm that we received physician orders which were faxed on 12/03/23 and 12/06/23.  order number:  12/03/23 - [id number] 12/06/23 - [id number]  Callback: [phone]

## 2023-12-12 DIAGNOSIS — N183 Chronic kidney disease, stage 3 unspecified: Secondary | ICD-10-CM | POA: Diagnosis not present

## 2023-12-12 DIAGNOSIS — J432 Centrilobular emphysema: Secondary | ICD-10-CM | POA: Diagnosis not present

## 2023-12-12 DIAGNOSIS — S72142D Displaced intertrochanteric fracture of left femur, subsequent encounter for closed fracture with routine healing: Secondary | ICD-10-CM | POA: Diagnosis not present

## 2023-12-12 DIAGNOSIS — E039 Hypothyroidism, unspecified: Secondary | ICD-10-CM | POA: Diagnosis not present

## 2023-12-12 DIAGNOSIS — F32A Depression, unspecified: Secondary | ICD-10-CM | POA: Diagnosis not present

## 2023-12-12 DIAGNOSIS — D62 Acute posthemorrhagic anemia: Secondary | ICD-10-CM | POA: Diagnosis not present

## 2023-12-12 DIAGNOSIS — E785 Hyperlipidemia, unspecified: Secondary | ICD-10-CM | POA: Diagnosis not present

## 2023-12-12 DIAGNOSIS — M6259 Muscle wasting and atrophy, not elsewhere classified, multiple sites: Secondary | ICD-10-CM | POA: Diagnosis not present

## 2023-12-12 DIAGNOSIS — G4733 Obstructive sleep apnea (adult) (pediatric): Secondary | ICD-10-CM | POA: Diagnosis not present

## 2023-12-21 DIAGNOSIS — F32A Depression, unspecified: Secondary | ICD-10-CM | POA: Diagnosis not present

## 2023-12-21 DIAGNOSIS — D62 Acute posthemorrhagic anemia: Secondary | ICD-10-CM | POA: Diagnosis not present

## 2023-12-21 DIAGNOSIS — S72142D Displaced intertrochanteric fracture of left femur, subsequent encounter for closed fracture with routine healing: Secondary | ICD-10-CM | POA: Diagnosis not present

## 2023-12-21 DIAGNOSIS — E039 Hypothyroidism, unspecified: Secondary | ICD-10-CM | POA: Diagnosis not present

## 2023-12-21 DIAGNOSIS — N183 Chronic kidney disease, stage 3 unspecified: Secondary | ICD-10-CM | POA: Diagnosis not present

## 2023-12-21 DIAGNOSIS — E785 Hyperlipidemia, unspecified: Secondary | ICD-10-CM | POA: Diagnosis not present

## 2023-12-21 DIAGNOSIS — G4733 Obstructive sleep apnea (adult) (pediatric): Secondary | ICD-10-CM | POA: Diagnosis not present

## 2023-12-21 DIAGNOSIS — J432 Centrilobular emphysema: Secondary | ICD-10-CM | POA: Diagnosis not present

## 2023-12-21 DIAGNOSIS — M6259 Muscle wasting and atrophy, not elsewhere classified, multiple sites: Secondary | ICD-10-CM | POA: Diagnosis not present

## 2023-12-28 ENCOUNTER — Other Ambulatory Visit: Payer: Self-pay | Admitting: Family Medicine

## 2023-12-28 DIAGNOSIS — E038 Other specified hypothyroidism: Secondary | ICD-10-CM

## 2023-12-28 DIAGNOSIS — M797 Fibromyalgia: Secondary | ICD-10-CM

## 2023-12-28 DIAGNOSIS — M25551 Pain in right hip: Secondary | ICD-10-CM

## 2024-01-02 ENCOUNTER — Ambulatory Visit

## 2024-01-02 DIAGNOSIS — M25552 Pain in left hip: Secondary | ICD-10-CM | POA: Diagnosis not present

## 2024-01-03 NOTE — Therapy (Signed)
 OUTPATIENT PHYSICAL THERAPY LOWER EXTREMITY EVALUATION   Patient Name: [name] [name] MRN: [id number] DOB:Sep 30, 1940, 83 y.o., female Today's Date: 01/04/2024  END OF SESSION:  PT End of Session - 01/04/24 1150     Visit Number 1    Number of Visits 17    Date for PT Re-Evaluation 03/02/24    Authorization Type [employer]- requesting auth    PT Start Time 1151    PT Stop Time 1230    PT Time Calculation (min) 39 min    Equipment Utilized During Treatment Other (comment)   RW   Activity Tolerance Patient tolerated treatment well    Behavior During Therapy WFL for tasks assessed/performed          Past Medical History:  Diagnosis Date   Anxiety    intermittent   Arthritis    blood in stool    CKD (chronic kidney disease), stage III (HCC)    COPD (chronic obstructive pulmonary disease) (HCC)    Diverticulosis    Emphysema lung (HCC)    Fibromyalgia    GERD (gastroesophageal reflux disease)    Glaucoma    Hemorrhoids    HSV-2 (herpes simplex virus 2) infection 2009   Hypothyroidism    OSA on CPAP 07/27/2006   Osteoporosis    Rectal bleeding    with hemorrhoids   Past Surgical History:  Procedure Laterality Date   BUNIONECTOMY  8001,7997   CARPAL TUNNEL RELEASE Right 12/08/2016   Procedure: CARPAL TUNNEL RELEASE;  Surgeon: Murrell Kuba, MD;  Location: [facility];  Service: Orthopedics;  Laterality: Right;  REG/FAB   CARPAL TUNNEL RELEASE Left 07/02/2021   Procedure: CARPAL TUNNEL RELEASE LEFT;  Surgeon: Murrell Kuba, MD;  Location: [facility];  Service: Orthopedics;  Laterality: Left;  45 MIN   CERVICAL FUSION  2008   EYE SURGERY     cataract and glaucoma   hemorrhoids removed     MENISCUS REPAIR Left    TOTAL HIP ARTHROPLASTY Right 07/03/2023   Procedure: TOTAL HIP ARTHROPLASTY ANTERIOR APPROACH;  Surgeon: Yvone Rush, MD;  Location: [facility];  Service: Orthopedics;  Laterality: Right;   TRIGGER FINGER RELEASE  2008   Patient  Active Problem List   Diagnosis Date Noted   Pre-operative clearance 05/26/2023   Primary osteoarthritis of left knee 02/11/2020   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   Right arm pain 03/07/2019   Diverticulosis 03/06/2019   CKD (chronic kidney disease), stage III (HCC) 09/06/2018   Allergic rhinitis 08/13/2018   Hypothyroid 02/05/2018   Osteopenia with high risk of fracture 04/26/2017   Bilateral tinnitus 04/25/2017   Restrictive lung disease 04/07/2017   Centrilobular emphysema (HCC) 01/19/2017   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   Trigger ring finger of right hand 08/12/2016   Genital herpes 02/16/2016   Primary osteoarthritis of right hip 07/27/2015   ASCVD (arteriosclerotic cardiovascular disease) 02/05/2015   Rectal bleeding 02/05/2015   Pulmonary nodules 02/05/2015   Glaucoma 01/06/2015   Fibromyalgia 01/06/2015   Elevated cholesterol with elevated triglycerides 01/06/2015   Obstructive sleep apnea on CPAP 01/06/2015   Osteoporosis 01/06/2015    PCP: Alvan Dorothyann BIRCH, MD  REFERRING PROVIDER: Yvone Rush, MD   REFERRING DIAG: [phone] (ICD-10-CM) - Left hip pain   THERAPY DIAG:  Pain in left hip  Muscle weakness (generalized)  Other abnormalities of gait and mobility  Rationale for Evaluation and Treatment: Rehabilitation  ONSET DATE: 10/06/23  SUBJECTIVE:   SUBJECTIVE STATEMENT: Patient reports  she had a fall in May where she tripped while walking and fell. She sustained a Lt intertrochanteric fracture and underwent surgery on 10/06/23. She was in the hospital for a week and then went to a SNF for 2 weeks. She just completed home health PT. Patient feels that she is holding herself back due to fear of falling and is more reliant on her RW. She is using her cane around the house. Prior to this injury she would use the cane for community ambulation and at home she would mostly go without an AD. She does feel that the the left leg is still weak  and doesn't feel stable walking on that leg. The back of the hip will ache and stays fairly constant. Does still have pain in the Rt thigh related to recent THA. Patient is afraid of falling, so has not been in the back yard. She was seen by Dr. Yvone this week and was told that her hip is healing well, but not fully healed. She will f/u again in 3 months and was told that she does not have any restrictions as it relates to the hip.   PERTINENT HISTORY: Rt THA  Osteoporosis  Lt intertrochanteris hip fracture s/p ORIF 10/06/23 COPD  Fibromyalgia PAIN:  Are you having pain? Yes: NPRS scale: 2 Pain location: Lt posterior hip Pain description: ache Aggravating factors: standing/walking Relieving factors: changing position  PRECAUTIONS: Fall and Other: osteoporosis   RED FLAGS: None   WEIGHT BEARING RESTRICTIONS: No  FALLS:  Has patient fallen in last 6 months? Yes. Number of falls 1- patient reports she just stubbed her toe, which caused LOB  LIVING ENVIRONMENT: Lives with: lives alone Lives in: House/apartment Stairs: No Has following equipment at home: Single point cane and Environmental consultant - 2 wheeled  OCCUPATION: retired   PLOF: Independent  PATIENT GOALS: I want to be mobile. I need my balance and strength back.   NEXT MD VISIT: November 2025   OBJECTIVE:  Note: Objective measures were completed at Evaluation unless otherwise noted.  DIAGNOSTIC FINDINGS: none on file   PATIENT SURVEYS:  ABC scale: The Activities-Specific Balance Confidence (ABC) Scale 0% 10 20 30  40 50 60 70 80 90 100% No confidence<->completely confident  "How confident are you that you will not lose your balance or become unsteady when you . . .   Date tested 01/04/24  Walk around the house 100%  2. Walk up or down stairs 90%  3. Bend over and pick up a slipper from in front of a closet floor 90%  4. Reach for a small can off a shelf at eye level 100%  5. Stand on tip toes and reach for something  above your head 100%  6. Stand on a chair and reach for something 0%  7. Sweep the floor 90%  8. Walk outside the house to a car parked in the driveway 100%  9. Get into or out of a car 100%  10. Walk across a parking lot to the mall 90%  11. Walk up or down a ramp 100%  12. Walk in a crowded mall where people rapidly walk past you 80%  13. Are bumped into by people as you walk through the mall 0%  14. Step onto or off of an escalator while you are holding onto the railing 70%  15. Step onto or off an escalator while holding onto parcels such that you cannot hold onto the railing 0%  16. Walk outside  on icy sidewalks 0%  Total: #/16  69 %     COGNITION: Overall cognitive status: Within functional limits for tasks assessed     SENSATION: Not tested   POSTURE: increased thoracic kyphosis    LOWER EXTREMITY ROM:  Active ROM Right eval Left eval  Hip flexion    Hip extension    Hip abduction    Hip adduction    Hip internal rotation    Hip external rotation    Knee flexion    Knee extension    Ankle dorsiflexion    Ankle plantarflexion    Ankle inversion    Ankle eversion     (Blank rows = not tested)  LOWER EXTREMITY MMT:  MMT Right eval Left eval  Hip flexion 4 4-  Hip extension    Hip abduction 4- in sitting  4- in sitting   Hip adduction    Hip internal rotation    Hip external rotation    Knee flexion 5 4+  Knee extension 5 4+  Ankle dorsiflexion    Ankle plantarflexion    Ankle inversion    Ankle eversion     (Blank rows = not tested)   FUNCTIONAL TESTS:  5 times sit to stand: 12.29 seconds; UE support  Timed up and go (TUG): 18.3 seconds with RW  Berg Balance Scale:  Item Test date: 01/04/24 Test date:  Test date:   Sitting to standing 3. able to stand independently using hands Insert OPRCBERGREEVAL SmartPhrase at re-test date Insert OPRCBERGREEVAL SmartPhrase at re-test date  2. Standing unsupported 4. able to stand safely for 2 minutes    3.  Sitting with back unsupported, feet supported 4. able to sit safely and securely for 2 minutes    4. Standing to sitting 3. controls descent by using hands    5. Pivot transfer  3. able to transfer safely with definite need of hands    6. Standing unsupported with eyes closed 4. able to stand 10 seconds safely    7. Standing unsupported with feet together 4. able to place feet together independently and stand 1 minute safely    8. Reaching forward with outstretched arms while standing 3. can reach forward 12 cm (5 inches)    9. Pick up object from the floor from standing 4. able to pick up slipper safely and easily    10. Turning to look behind over left and right shoulders while standing 4. looks behind from both sides and weight shifts well    11. Turn 360 degrees 2. able to turn 360 degrees safely but slowly    12. Place alternate foot on step or stool while standing unsupported 1. able to complete > 2 steps needs minimal assist    13. Standing unsupported one foot in front 2. able to take small step independently and hold 30 seconds    14. Standing on one leg 1. tries to lift leg unable to hold 3 seconds but remains standing independently.      Total Score 42/56 Total Score  Total Score      GAIT: Distance walked: 20 ft  Assistive device utilized: Walker - 2 wheeled Level of assistance: Modified independence Comments: decreased foot clearance, antalgic Lt  [facility] Adult PT Treatment:                                                DATE: 01/04/24 Therapeutic Exercise: Demonstrated, performed, and issued initial HEP.      PATIENT EDUCATION:  Education details: see treatment; POC  Person educated: Patient Education method: Explanation, Demonstration, Tactile cues, Verbal cues, and Handouts Education comprehension: verbalized understanding, returned  demonstration, verbal cues required, tactile cues required, and needs further education  HOME EXERCISE PROGRAM: Access Code: [email or web address] URL: [email or web address] Date: 01/04/2024 Prepared by: Lucie Meeter  Exercises - Sit to Stand with Armchair  - 1 x daily - 7 x weekly - 2 sets - 10 reps - Standing March with Counter Support  - 1 x daily - 7 x weekly - 2 sets - 10 reps - Heel Raises with Counter Support  - 1 x daily - 7 x weekly - 2 sets - 10 reps - Standing Hip Abduction with Counter Support  - 1 x daily - 7 x weekly - 2 sets - 10 reps  ASSESSMENT:  CLINICAL IMPRESSION: Patient is a 83 y.o. female who was seen today for physical therapy evaluation and treatment for s/p Lt hip ORIF on 10/06/23 after sustaining intertrochanteric fracture from a fall. She has undergone physical therapy in a skilled nursing facility and at home. She is currently ambulating majority of time using her RW and occasionally her SPC. Prior to her fall she utilized her [facility] for community ambulation, but was mostly independent with household ambulation. She also reports being fearful of falling and has avoiding certain activities at this time due to this fear. She scores at an increased fall risk based upon her TUG, BERG, and 5 x STS. In addition she has bilateral hip weakness and gait abnormalities. She will benefit from skilled PT to address the above stated deficits in order to optimize her function and assist in overall pain reduction.   OBJECTIVE IMPAIRMENTS: Abnormal gait, cardiopulmonary status limiting activity, decreased activity tolerance, decreased balance, decreased endurance, decreased knowledge of condition, decreased mobility, difficulty walking, decreased ROM, decreased strength, improper body mechanics, postural dysfunction, and pain.   ACTIVITY LIMITATIONS: carrying, lifting, bending, standing, squatting, stairs, transfers, toileting, and locomotion level  PARTICIPATION LIMITATIONS:  cleaning, driving, shopping, community activity, and yard work  PERSONAL FACTORS: Age, Fitness, Time since onset of injury/illness/exacerbation, and 3+ comorbidities: see PMH/PSH above  are also affecting patient's functional outcome.   REHAB POTENTIAL: Good  CLINICAL DECISION MAKING: Evolving/moderate complexity  EVALUATION COMPLEXITY: Moderate   GOALS: Goals reviewed with patient? Yes  SHORT TERM GOALS: Target date: 02/01/2024   Patient will be independent and compliant with initial HEP.   Baseline: initial HEP issued  Goal status: INITIAL  2.  Patient will be able to complete sit to stand transfer without UE support to improve ease of transfers.  Baseline: requires UE support Goal status: INITIAL  3.  Patient will complete TUG on </= 15 seconds with LRAD.  Baseline: see above Goal status: INITIAL    LONG TERM GOALS: Target date: 03/02/24  Patient will be able to negotiate curbs with [facility] to improve independence with community ambulation.  Baseline: utilizes RW for community ambulation currently.  Goal status: INITIAL  2.  Patient will self report ability to ambulate in her back yard with LRAD.  Baseline: avoiding walking in  backyard due to fear of falling  Goal status: INITIAL  3.  Patient will score >/= 45/56 on the BERG to signify reduction in fall risk.  Baseline: see above  Goal status: INITIAL  4.  Patient will ambulate household distances independently. Baseline: Mod I (RW or cane currently)  Goal status: INITIAL  5.  Patient will be independent with advanced home program to progress/maintain current level of function.  Baseline: initial HEP issued  Goal status: INITIAL    PLAN:  PT FREQUENCY: 2x/week  PT DURATION: 8 weeks  PLANNED INTERVENTIONS: 97164- PT Re-evaluation, 97750- Physical Performance Testing, 97110-Therapeutic exercises, 97530- Therapeutic activity, [id number]- Neuromuscular re-education, 97535- Self Care, [id number]- Manual therapy, [id number]-  Gait training, [phone] (1-2 muscles), 20561 (3+ muscles)- Dry Needling, Cryotherapy, and Moist heat  PLAN FOR NEXT SESSION: update HEP to include static balance activity; gait training with SPC. Hip strengthening. Work on sit to stand progressing to no UE support as able   Lucie Meeter, PT, DPT, ATC 01/04/24 12:48 PM  Referring diagnosis? M25.552 (ICD-10-CM) - Left hip pain  Treatment diagnosis? (if different than referring diagnosis)  Pain in left hip  Muscle weakness (generalized)  Other abnormalities of gait and mobility  What was this (referring dx) caused by? [x]  Surgery [x]  Fall []  Ongoing issue []  Arthritis []  Other: ____________  Laterality: []  Rt [x]  Lt []  Both  Check all possible CPT codes:  *CHOOSE 10 OR LESS*    See Planned Interventions listed in the Plan section of the Evaluation.

## 2024-01-04 ENCOUNTER — Other Ambulatory Visit: Payer: Self-pay

## 2024-01-04 ENCOUNTER — Ambulatory Visit: Attending: Orthopedic Surgery

## 2024-01-04 DIAGNOSIS — M6281 Muscle weakness (generalized): Secondary | ICD-10-CM | POA: Diagnosis not present

## 2024-01-04 DIAGNOSIS — M25552 Pain in left hip: Secondary | ICD-10-CM | POA: Diagnosis not present

## 2024-01-04 DIAGNOSIS — R2689 Other abnormalities of gait and mobility: Secondary | ICD-10-CM | POA: Diagnosis not present

## 2024-01-09 ENCOUNTER — Encounter: Payer: Self-pay | Admitting: Sports Medicine

## 2024-01-09 DIAGNOSIS — G4733 Obstructive sleep apnea (adult) (pediatric): Secondary | ICD-10-CM | POA: Diagnosis not present

## 2024-01-11 ENCOUNTER — Ambulatory Visit: Attending: Orthopedic Surgery

## 2024-01-11 DIAGNOSIS — M6281 Muscle weakness (generalized): Secondary | ICD-10-CM | POA: Insufficient documentation

## 2024-01-11 DIAGNOSIS — M25552 Pain in left hip: Secondary | ICD-10-CM | POA: Diagnosis not present

## 2024-01-11 DIAGNOSIS — R2689 Other abnormalities of gait and mobility: Secondary | ICD-10-CM | POA: Diagnosis not present

## 2024-01-11 NOTE — Therapy (Signed)
 OUTPATIENT PHYSICAL THERAPY LOWER EXTREMITY TREATMENT   Patient Name: [name] [name] MRN: [id number] DOB:[birth year], 83 y.o., female Today's Date: 01/11/2024  END OF SESSION:  PT End of Session - 01/11/24 1405     Visit Number 2    Number of Visits 17    Date for PT Re-Evaluation 03/02/24    Authorization Type [employer]    Authorization Time Period 9 visits approved for PT 12/25/2023-02/19/2024    Authorization - Visit Number 2    Authorization - Number of Visits 9    PT Start Time 1405    PT Stop Time 1450    PT Time Calculation (min) 45 min    Activity Tolerance Patient tolerated treatment well    Behavior During Therapy WFL for tasks assessed/performed         Past Medical History:  Diagnosis Date   Anxiety    intermittent   Arthritis    blood in stool    CKD (chronic kidney disease), stage III (HCC)    COPD (chronic obstructive pulmonary disease) (HCC)    Diverticulosis    Emphysema lung (HCC)    Fibromyalgia    GERD (gastroesophageal reflux disease)    Glaucoma    Hemorrhoids    HSV-2 (herpes simplex virus 2) infection 2009   Hypothyroidism    OSA on CPAP 07/27/2006   Osteoporosis    Rectal bleeding    with hemorrhoids   Past Surgical History:  Procedure Laterality Date   BUNIONECTOMY  8001,7997   CARPAL TUNNEL RELEASE Right 12/08/2016   Procedure: CARPAL TUNNEL RELEASE;  Surgeon: Murrell Kuba, MD;  Location: [facility];  Service: Orthopedics;  Laterality: Right;  REG/FAB   CARPAL TUNNEL RELEASE Left 07/02/2021   Procedure: CARPAL TUNNEL RELEASE LEFT;  Surgeon: Murrell Kuba, MD;  Location: [facility];  Service: Orthopedics;  Laterality: Left;  45 MIN   CERVICAL FUSION  2008   EYE SURGERY     cataract and glaucoma   hemorrhoids removed     MENISCUS REPAIR Left    TOTAL HIP ARTHROPLASTY Right 07/03/2023   Procedure: TOTAL HIP ARTHROPLASTY ANTERIOR APPROACH;  Surgeon: Yvone Rush, MD;  Location: [facility];  Service: Orthopedics;   Laterality: Right;   TRIGGER FINGER RELEASE  2008   Patient Active Problem List   Diagnosis Date Noted   Pre-operative clearance 05/26/2023   Primary osteoarthritis of left knee 02/11/2020   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   Right arm pain 03/07/2019   Diverticulosis 03/06/2019   CKD (chronic kidney disease), stage III (HCC) 09/06/2018   Allergic rhinitis 08/13/2018   Hypothyroid 02/05/2018   Osteopenia with high risk of fracture 04/26/2017   Bilateral tinnitus 04/25/2017   Restrictive lung disease 04/07/2017   Centrilobular emphysema (HCC) 01/19/2017   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   Trigger ring finger of right hand 08/12/2016   Genital herpes 02/16/2016   Primary osteoarthritis of right hip 07/27/2015   ASCVD (arteriosclerotic cardiovascular disease) 02/05/2015   Rectal bleeding 02/05/2015   Pulmonary nodules 02/05/2015   Glaucoma 01/06/2015   Fibromyalgia 01/06/2015   Elevated cholesterol with elevated triglycerides 01/06/2015   Obstructive sleep apnea on CPAP 01/06/2015   Osteoporosis 01/06/2015    PCP: Alvan Dorothyann BIRCH, MD  REFERRING PROVIDER: Yvone Rush, MD   REFERRING DIAG: [id number] (ICD-10-CM) - Left hip pain   THERAPY DIAG:  Pain in left hip  Muscle weakness (generalized)  Other abnormalities of gait and mobility  Rationale for Evaluation  and Treatment: Rehabilitation  ONSET DATE: 10/06/23  SUBJECTIVE:   SUBJECTIVE STATEMENT: Patient reports she is sore in bilateral outer thighs and hips; states she is cruising around house more without cane due to having furniture to hold on to. Patient states she feels weak in Lt leg.   EVAL: Patient reports she had a fall in May where she tripped while walking and fell. She sustained a Lt intertrochanteric fracture and underwent surgery on 10/06/23. She was in the hospital for a week and then went to a SNF for 2 weeks. She just completed home health PT. Patient feels that she is holding  herself back due to fear of falling and is more reliant on her RW. She is using her cane around the house. Prior to this injury she would use the cane for community ambulation and at home she would mostly go without an AD. She does feel that the the left leg is still weak and doesn't feel stable walking on that leg. The back of the hip will ache and stays fairly constant. Does still have pain in the Rt thigh related to recent THA. Patient is afraid of falling, so has not been in the back yard. She was seen by Dr. Yvone this week and was told that her hip is healing well, but not fully healed. She will f/u again in 3 months and was told that she does not have any restrictions as it relates to the hip.   PERTINENT HISTORY: Rt THA  Osteoporosis  Lt intertrochanteris hip fracture s/p ORIF 10/06/23 COPD  Fibromyalgia PAIN:  Are you having pain? Yes: NPRS scale: 2 Pain location: Lt posterior hip Pain description: ache Aggravating factors: standing/walking Relieving factors: changing position  PRECAUTIONS: Fall and Other: osteoporosis   RED FLAGS: None   WEIGHT BEARING RESTRICTIONS: No  FALLS:  Has patient fallen in last 6 months? Yes. Number of falls 1- patient reports she just stubbed her toe, which caused LOB  LIVING ENVIRONMENT: Lives with: lives alone Lives in: House/apartment Stairs: No Has following equipment at home: Single point cane and Environmental consultant - 2 wheeled  OCCUPATION: retired   PLOF: Independent  PATIENT GOALS: I want to be mobile. I need my balance and strength back.   NEXT MD VISIT: November 2025   OBJECTIVE:  Note: Objective measures were completed at Evaluation unless otherwise noted.  DIAGNOSTIC FINDINGS: none on file   PATIENT SURVEYS:  ABC scale: The Activities-Specific Balance Confidence (ABC) Scale 0% 10 20 30  40 50 60 70 80 90 100% No confidence<->completely confident  "How confident are you that you will not lose your balance or become unsteady when you  . . .   Date tested 01/04/24  Walk around the house 100%  2. Walk up or down stairs 90%  3. Bend over and pick up a slipper from in front of a closet floor 90%  4. Reach for a small can off a shelf at eye level 100%  5. Stand on tip toes and reach for something above your head 100%  6. Stand on a chair and reach for something 0%  7. Sweep the floor 90%  8. Walk outside the house to a car parked in the driveway 100%  9. Get into or out of a car 100%  10. Walk across a parking lot to the mall 90%  11. Walk up or down a ramp 100%  12. Walk in a crowded mall where people rapidly walk past you 80%  13.  Are bumped into by people as you walk through the mall 0%  14. Step onto or off of an escalator while you are holding onto the railing 70%  15. Step onto or off an escalator while holding onto parcels such that you cannot hold onto the railing 0%  16. Walk outside on icy sidewalks 0%  Total: #/16  69 %     COGNITION: Overall cognitive status: Within functional limits for tasks assessed     SENSATION: Not tested   POSTURE: increased thoracic kyphosis    LOWER EXTREMITY ROM:  Active ROM Right eval Left eval  Hip flexion    Hip extension    Hip abduction    Hip adduction    Hip internal rotation    Hip external rotation    Knee flexion    Knee extension    Ankle dorsiflexion    Ankle plantarflexion    Ankle inversion    Ankle eversion     (Blank rows = not tested)  LOWER EXTREMITY MMT:  MMT Right eval Left eval  Hip flexion 4 4-  Hip extension    Hip abduction 4- in sitting  4- in sitting   Hip adduction    Hip internal rotation    Hip external rotation    Knee flexion 5 4+  Knee extension 5 4+  Ankle dorsiflexion    Ankle plantarflexion    Ankle inversion    Ankle eversion     (Blank rows = not tested)   FUNCTIONAL TESTS:  5 times sit to stand: 12.29 seconds; UE support  Timed up and go (TUG): 18.3 seconds with RW  Berg Balance Scale:  Item Test date:  01/04/24 Test date:  Test date:   Sitting to standing 3. able to stand independently using hands Insert OPRCBERGREEVAL SmartPhrase at re-test date Insert OPRCBERGREEVAL SmartPhrase at re-test date  2. Standing unsupported 4. able to stand safely for 2 minutes    3. Sitting with back unsupported, feet supported 4. able to sit safely and securely for 2 minutes    4. Standing to sitting 3. controls descent by using hands    5. Pivot transfer  3. able to transfer safely with definite need of hands    6. Standing unsupported with eyes closed 4. able to stand 10 seconds safely    7. Standing unsupported with feet together 4. able to place feet together independently and stand 1 minute safely    8. Reaching forward with outstretched arms while standing 3. can reach forward 12 cm (5 inches)    9. Pick up object from the floor from standing 4. able to pick up slipper safely and easily    10. Turning to look behind over left and right shoulders while standing 4. looks behind from both sides and weight shifts well    11. Turn 360 degrees 2. able to turn 360 degrees safely but slowly    12. Place alternate foot on step or stool while standing unsupported 1. able to complete > 2 steps needs minimal assist    13. Standing unsupported one foot in front 2. able to take small step independently and hold 30 seconds    14. Standing on one leg 1. tries to lift leg unable to hold 3 seconds but remains standing independently.      Total Score 42/56 Total Score  Total Score      GAIT: Distance walked: 20 ft  Assistive device utilized: Walker - 2 wheeled Level  of assistance: Modified independence Comments: decreased foot clearance, antalgic Lt    [facility] Adult PT Treatment:                                                DATE: 01/11/2024 Neuromuscular re-ed: Hooklying hip add ball squeeze Hooklying hip abd + red TB Bridges + red TB around thighs --> added folded towel under pelvis for lumbar discomfort Therapeutic  Activity: Standing mini squats + seat tap on pillow stack 2x10 --> rest break b/w due to SOB 4 step ups + bilateral railing Standing:  Hip abduction Hip extension HS curls Marching  Gait: Gait training with [facility]                                                                                                                                [facility] Adult PT Treatment:                                                DATE: 01/04/24 Therapeutic Exercise: Demonstrated, performed, and issued initial HEP.      PATIENT EDUCATION:  Education details: see treatment; POC  Person educated: Patient Education method: Explanation, Demonstration, Tactile cues, Verbal cues, and Handouts Education comprehension: verbalized understanding, returned demonstration, verbal cues required, tactile cues required, and needs further education  HOME EXERCISE PROGRAM: Access Code: [email or web address] URL: [email or web address] Date: 01/04/2024 Prepared by: Lucie Meeter  Exercises - Sit to Stand with Armchair  - 1 x daily - 7 x weekly - 2 sets - 10 reps - Standing March with Counter Support  - 1 x daily - 7 x weekly - 2 sets - 10 reps - Heel Raises with Counter Support  - 1 x daily - 7 x weekly - 2 sets - 10 reps - Standing Hip Abduction with Counter Support  - 1 x daily - 7 x weekly - 2 sets - 10 reps  ASSESSMENT:  CLINICAL IMPRESSION: Hip strengthening progressed from supine to standing as tolerated. Noted shortness of breath during standing squat activity; O2 monitored and remained at/above 90. Good coordination and mechanics demonstrated during gait training with SPC. Cueing slight posterior pelvic tilt during supine bridges alleviated lumbar discomfort. Moderate difficulty demonstrated during sit to stand without assist from UE due to weakness.   EVAL: Patient is a 83 y.o. female who was seen today for physical therapy evaluation and treatment for s/p Lt hip ORIF on 10/06/23 after sustaining  intertrochanteric fracture from a fall. She has undergone physical therapy in a skilled nursing facility and at home. She is currently ambulating majority of time using her RW and occasionally her SPC. Prior to her fall she utilized her [facility]  for community ambulation, but was mostly independent with household ambulation. She also reports being fearful of falling and has avoiding certain activities at this time due to this fear. She scores at an increased fall risk based upon her TUG, BERG, and 5 x STS. In addition she has bilateral hip weakness and gait abnormalities. She will benefit from skilled PT to address the above stated deficits in order to optimize her function and assist in overall pain reduction.   OBJECTIVE IMPAIRMENTS: Abnormal gait, cardiopulmonary status limiting activity, decreased activity tolerance, decreased balance, decreased endurance, decreased knowledge of condition, decreased mobility, difficulty walking, decreased ROM, decreased strength, improper body mechanics, postural dysfunction, and pain.   ACTIVITY LIMITATIONS: carrying, lifting, bending, standing, squatting, stairs, transfers, toileting, and locomotion level  PARTICIPATION LIMITATIONS: cleaning, driving, shopping, community activity, and yard work  PERSONAL FACTORS: Age, Fitness, Time since onset of injury/illness/exacerbation, and 3+ comorbidities: see PMH/PSH above  are also affecting patient's functional outcome.   REHAB POTENTIAL: Good  CLINICAL DECISION MAKING: Evolving/moderate complexity  EVALUATION COMPLEXITY: Moderate   GOALS: Goals reviewed with patient? Yes  SHORT TERM GOALS: Target date: 02/01/2024  Patient will be independent and compliant with initial HEP.  Baseline: initial HEP issued  Goal status: INITIAL  2.  Patient will be able to complete sit to stand transfer without UE support to improve ease of transfers.  Baseline: requires UE support Goal status: INITIAL  3.  Patient will complete  TUG on </= 15 seconds with LRAD.  Baseline: see above Goal status: INITIAL    LONG TERM GOALS: Target date: 03/02/24  Patient will be able to negotiate curbs with [facility] to improve independence with community ambulation.  Baseline: utilizes RW for community ambulation currently.  Goal status: INITIAL  2.  Patient will self report ability to ambulate in her back yard with LRAD.  Baseline: avoiding walking in backyard due to fear of falling  Goal status: INITIAL  3.  Patient will score >/= 45/56 on the BERG to signify reduction in fall risk.  Baseline: see above  Goal status: INITIAL  4.  Patient will ambulate household distances independently. Baseline: Mod I (RW or cane currently)  Goal status: INITIAL  5.  Patient will be independent with advanced home program to progress/maintain current level of function.  Baseline: initial HEP issued  Goal status: INITIAL    PLAN:  PT FREQUENCY: 2x/week  PT DURATION: 8 weeks  PLANNED INTERVENTIONS: 97164- PT Re-evaluation, 97750- Physical Performance Testing, 97110-Therapeutic exercises, 97530- Therapeutic activity, [id number]- Neuromuscular re-education, 97535- Self Care, [id number]- Manual therapy, [id number]- Gait training, [phone] (1-2 muscles), 20561 (3+ muscles)- Dry Needling, Cryotherapy, and Moist heat  PLAN FOR NEXT SESSION: update HEP to include static balance activity; gait training with SPC. Hip strengthening. Work on sit to stand progressing to no UE support as able   Lamarr Price, PTA 01/11/24 4:26 PM

## 2024-01-16 ENCOUNTER — Ambulatory Visit: Payer: Self-pay

## 2024-01-16 DIAGNOSIS — M6281 Muscle weakness (generalized): Secondary | ICD-10-CM | POA: Diagnosis not present

## 2024-01-16 DIAGNOSIS — R2689 Other abnormalities of gait and mobility: Secondary | ICD-10-CM | POA: Diagnosis not present

## 2024-01-16 DIAGNOSIS — M25552 Pain in left hip: Secondary | ICD-10-CM | POA: Diagnosis not present

## 2024-01-16 NOTE — Therapy (Addendum)
 OUTPATIENT PHYSICAL THERAPY LOWER EXTREMITY TREATMENT PHYSICAL THERAPY DISCHARGE SUMMARY  Visits from Start of Care: 3  Current functional level related to goals / functional outcomes: No formal re-assessment of goals   Remaining deficits: Status unknown   Education / Equipment: N/A   Patient agrees to discharge. Patient goals were not met. Patient is being discharged due to not returning since the last visit.   Patient Name: [name] [name] MRN: [id number] DOB:1940/05/10, 83 y.o., female Today's Date: 01/16/2024  END OF SESSION:  PT End of Session - 01/16/24 1405     Visit Number 3    Number of Visits 17    Date for PT Re-Evaluation 03/02/24    Authorization Type [employer]    Authorization Time Period 8/28-10/25    Authorization - Visit Number 3    Authorization - Number of Visits 17    PT Start Time 1405    PT Stop Time 1445    PT Time Calculation (min) 40 min    Activity Tolerance Patient tolerated treatment well    Behavior During Therapy WFL for tasks assessed/performed          Past Medical History:  Diagnosis Date   Anxiety    intermittent   Arthritis    blood in stool    CKD (chronic kidney disease), stage III (HCC)    COPD (chronic obstructive pulmonary disease) (HCC)    Diverticulosis    Emphysema lung (HCC)    Fibromyalgia    GERD (gastroesophageal reflux disease)    Glaucoma    Hemorrhoids    HSV-2 (herpes simplex virus 2) infection 2009   Hypothyroidism    OSA on CPAP 07/27/2006   Osteoporosis    Rectal bleeding    with hemorrhoids   Past Surgical History:  Procedure Laterality Date   BUNIONECTOMY  8001,7997   CARPAL TUNNEL RELEASE Right 12/08/2016   Procedure: CARPAL TUNNEL RELEASE;  Surgeon: Murrell Kuba, MD;  Location: [facility];  Service: Orthopedics;  Laterality: Right;  REG/FAB   CARPAL TUNNEL RELEASE Left 07/02/2021   Procedure: CARPAL TUNNEL RELEASE LEFT;  Surgeon: Murrell Kuba, MD;  Location:  SURGERY  CENTER;  Service: Orthopedics;  Laterality: Left;  45 MIN   CERVICAL FUSION  2008   EYE SURGERY     cataract and glaucoma   hemorrhoids removed     MENISCUS REPAIR Left    TOTAL HIP ARTHROPLASTY Right 07/03/2023   Procedure: TOTAL HIP ARTHROPLASTY ANTERIOR APPROACH;  Surgeon: Yvone Rush, MD;  Location: [facility];  Service: Orthopedics;  Laterality: Right;   TRIGGER FINGER RELEASE  2008   Patient Active Problem List   Diagnosis Date Noted   Pre-operative clearance 05/26/2023   Primary osteoarthritis of left knee 02/11/2020   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   Right arm pain 03/07/2019   Diverticulosis 03/06/2019   CKD (chronic kidney disease), stage III (HCC) 09/06/2018   Allergic rhinitis 08/13/2018   Hypothyroid 02/05/2018   Osteopenia with high risk of fracture 04/26/2017   Bilateral tinnitus 04/25/2017   Restrictive lung disease 04/07/2017   Centrilobular emphysema (HCC) 01/19/2017   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   Trigger ring finger of right hand 08/12/2016   Genital herpes 02/16/2016   Primary osteoarthritis of right hip 07/27/2015   ASCVD (arteriosclerotic cardiovascular disease) 02/05/2015   Rectal bleeding 02/05/2015   Pulmonary nodules 02/05/2015   Glaucoma 01/06/2015   Fibromyalgia 01/06/2015   Elevated cholesterol with elevated triglycerides 01/06/2015   Obstructive  sleep apnea on CPAP 01/06/2015   Osteoporosis 01/06/2015    PCP: Alvan Dorothyann BIRCH, MD  REFERRING PROVIDER: Yvone Rush, MD   REFERRING DIAG: [phone] (ICD-10-CM) - Left hip pain   THERAPY DIAG:  Pain in left hip  Muscle weakness (generalized)  Other abnormalities of gait and mobility  Rationale for Evaluation and Treatment: Rehabilitation  ONSET DATE: 10/06/23  SUBJECTIVE:   SUBJECTIVE STATEMENT: Patient reports the hip is getting better. She feels that her breathing is getting worse from the COPD (more dyspnea on exertion) and has plans to f/u with PCP  regarding this.   EVAL: Patient reports she had a fall in May where she tripped while walking and fell. She sustained a Lt intertrochanteric fracture and underwent surgery on 10/06/23. She was in the hospital for a week and then went to a SNF for 2 weeks. She just completed home health PT. Patient feels that she is holding herself back due to fear of falling and is more reliant on her RW. She is using her cane around the house. Prior to this injury she would use the cane for community ambulation and at home she would mostly go without an AD. She does feel that the the left leg is still weak and doesn't feel stable walking on that leg. The back of the hip will ache and stays fairly constant. Does still have pain in the Rt thigh related to recent THA. Patient is afraid of falling, so has not been in the back yard. She was seen by Dr. Yvone this week and was told that her hip is healing well, but not fully healed. She will f/u again in 3 months and was told that she does not have any restrictions as it relates to the hip.   PERTINENT HISTORY: Rt THA  Osteoporosis  Lt intertrochanteris hip fracture s/p ORIF 10/06/23 COPD  Fibromyalgia PAIN:  Are you having pain? Yes: NPRS scale: none currently; at worst 3 Pain location: Lt posterior hip; low back Pain description: ache Aggravating factors: standing/walking Relieving factors: changing position  PRECAUTIONS: Fall and Other: osteoporosis   RED FLAGS: None   WEIGHT BEARING RESTRICTIONS: No  FALLS:  Has patient fallen in last 6 months? Yes. Number of falls 1- patient reports she just stubbed her toe, which caused LOB  LIVING ENVIRONMENT: Lives with: lives alone Lives in: House/apartment Stairs: No Has following equipment at home: Single point cane and Environmental Consultant - 2 wheeled  OCCUPATION: retired   PLOF: Independent  PATIENT GOALS: I want to be mobile. I need my balance and strength back.   NEXT MD VISIT: November 2025   OBJECTIVE:  Note:  Objective measures were completed at Evaluation unless otherwise noted.  DIAGNOSTIC FINDINGS: none on file   PATIENT SURVEYS:  ABC scale: The Activities-Specific Balance Confidence (ABC) Scale 0% 10 20 30  40 50 60 70 80 90 100% No confidence<->completely confident  "How confident are you that you will not lose your balance or become unsteady when you . . .   Date tested 01/04/24  Walk around the house 100%  2. Walk up or down stairs 90%  3. Bend over and pick up a slipper from in front of a closet floor 90%  4. Reach for a small can off a shelf at eye level 100%  5. Stand on tip toes and reach for something above your head 100%  6. Stand on a chair and reach for something 0%  7. Sweep the floor 90%  8.  Walk outside the house to a car parked in the driveway 100%  9. Get into or out of a car 100%  10. Walk across a parking lot to the mall 90%  11. Walk up or down a ramp 100%  12. Walk in a crowded mall where people rapidly walk past you 80%  13. Are bumped into by people as you walk through the mall 0%  14. Step onto or off of an escalator while you are holding onto the railing 70%  15. Step onto or off an escalator while holding onto parcels such that you cannot hold onto the railing 0%  16. Walk outside on icy sidewalks 0%  Total: #/16  69 %     COGNITION: Overall cognitive status: Within functional limits for tasks assessed     SENSATION: Not tested   POSTURE: increased thoracic kyphosis    LOWER EXTREMITY ROM:  Active ROM Right eval Left eval  Hip flexion    Hip extension    Hip abduction    Hip adduction    Hip internal rotation    Hip external rotation    Knee flexion    Knee extension    Ankle dorsiflexion    Ankle plantarflexion    Ankle inversion    Ankle eversion     (Blank rows = not tested)  LOWER EXTREMITY MMT:  MMT Right eval Left eval  Hip flexion 4 4-  Hip extension    Hip abduction 4- in sitting  4- in sitting   Hip adduction    Hip  internal rotation    Hip external rotation    Knee flexion 5 4+  Knee extension 5 4+  Ankle dorsiflexion    Ankle plantarflexion    Ankle inversion    Ankle eversion     (Blank rows = not tested)   FUNCTIONAL TESTS:  5 times sit to stand: 12.29 seconds; UE support  Timed up and go (TUG): 18.3 seconds with RW  Berg Balance Scale:  Item Test date: 01/04/24 Test date:  Test date:   Sitting to standing 3. able to stand independently using hands Insert OPRCBERGREEVAL SmartPhrase at re-test date Insert OPRCBERGREEVAL SmartPhrase at re-test date  2. Standing unsupported 4. able to stand safely for 2 minutes    3. Sitting with back unsupported, feet supported 4. able to sit safely and securely for 2 minutes    4. Standing to sitting 3. controls descent by using hands    5. Pivot transfer  3. able to transfer safely with definite need of hands    6. Standing unsupported with eyes closed 4. able to stand 10 seconds safely    7. Standing unsupported with feet together 4. able to place feet together independently and stand 1 minute safely    8. Reaching forward with outstretched arms while standing 3. can reach forward 12 cm (5 inches)    9. Pick up object from the floor from standing 4. able to pick up slipper safely and easily    10. Turning to look behind over left and right shoulders while standing 4. looks behind from both sides and weight shifts well    11. Turn 360 degrees 2. able to turn 360 degrees safely but slowly    12. Place alternate foot on step or stool while standing unsupported 1. able to complete > 2 steps needs minimal assist    13. Standing unsupported one foot in front 2. able to take small step independently  and hold 30 seconds    14. Standing on one leg 1. tries to lift leg unable to hold 3 seconds but remains standing independently.      Total Score 42/56 Total Score  Total Score      GAIT: Distance walked: 20 ft  Assistive device utilized: Walker - 2 wheeled Level of  assistance: Modified independence Comments: decreased foot clearance, antalgic Lt   [facility] Adult PT Treatment:                                                DATE: 01/16/24 Beginning of session: SpO2: 98%  End of session: SpO2: 96%  Therapeutic Exercise: LAQ 2 x 10; 1.5 lbs  Reviewed HEP   Therapeautic Activity: Walking with SPC 1 lap in clinic; with SPC and holding container 1 lap in clinic  Walking med center hallway with SPC; SpO2: 90% at end of walking down hallway; 91% at end of walking back    Self Care: Recommended f/u with PCP regarding COPD and SOB.  Recommendation on purchasing pulse oximeter to monitor oxygen at home Educated on normal SpO2 values.   [facility] Adult PT Treatment:                                                DATE: 01/11/2024 Neuromuscular re-ed: Hooklying hip add ball squeeze Hooklying hip abd + red TB Bridges + red TB around thighs --> added folded towel under pelvis for lumbar discomfort Therapeutic Activity: Standing mini squats + seat tap on pillow stack 2x10 --> rest break b/w due to SOB 4 step ups + bilateral railing Standing:  Hip abduction Hip extension HS curls Marching  Gait: Gait training with [facility]                                                                                                                                [facility] Adult PT Treatment:                                                DATE: 01/04/24 Therapeutic Exercise: Demonstrated, performed, and issued initial HEP.      PATIENT EDUCATION:  Education details: see treatment Person educated: Patient Education method: Explanation, Demonstration, Tactile cues, Verbal cues, and Handouts Education comprehension: verbalized understanding, returned demonstration, verbal cues required, tactile cues required, and needs further education  HOME EXERCISE PROGRAM: Access Code: [email or web address] URL: [email or web address] Date: 01/04/2024 Prepared by: Lucie Meeter  Exercises -  Sit to Stand with Armchair  - 1 x daily - 7  x weekly - 2 sets - 10 reps - Standing March with Counter Support  - 1 x daily - 7 x weekly - 2 sets - 10 reps - Heel Raises with Counter Support  - 1 x daily - 7 x weekly - 2 sets - 10 reps - Standing Hip Abduction with Counter Support  - 1 x daily - 7 x weekly - 2 sets - 10 reps  ASSESSMENT:  CLINICAL IMPRESSION: Patient reports overall the hip continues to improve, but feels her SOB is more limiting at this time. She demonstrates good stability when walking in clinic with SPC, but does become SOB quickly. When walking in [facility] hallway using SPC her SpO2 was at 90-91% with associated SOB requiring seated rest break to improve. It was recommended that patient f/u with PCP regarding DOE with patient verbalizing understanding. Was recommended that she could begin to use her [facility] for household ambulation and continue RW for community ambulation with patient verbalizing understanding.   EVAL: Patient is a 83 y.o. female who was seen today for physical therapy evaluation and treatment for s/p Lt hip ORIF on 10/06/23 after sustaining intertrochanteric fracture from a fall. She has undergone physical therapy in a skilled nursing facility and at home. She is currently ambulating majority of time using her RW and occasionally her SPC. Prior to her fall she utilized her [facility] for community ambulation, but was mostly independent with household ambulation. She also reports being fearful of falling and has avoiding certain activities at this time due to this fear. She scores at an increased fall risk based upon her TUG, BERG, and 5 x STS. In addition she has bilateral hip weakness and gait abnormalities. She will benefit from skilled PT to address the above stated deficits in order to optimize her function and assist in overall pain reduction.   OBJECTIVE IMPAIRMENTS: Abnormal gait, cardiopulmonary status limiting activity, decreased activity tolerance, decreased balance,  decreased endurance, decreased knowledge of condition, decreased mobility, difficulty walking, decreased ROM, decreased strength, improper body mechanics, postural dysfunction, and pain.   ACTIVITY LIMITATIONS: carrying, lifting, bending, standing, squatting, stairs, transfers, toileting, and locomotion level  PARTICIPATION LIMITATIONS: cleaning, driving, shopping, community activity, and yard work  PERSONAL FACTORS: Age, Fitness, Time since onset of injury/illness/exacerbation, and 3+ comorbidities: see PMH/PSH above  are also affecting patient's functional outcome.   REHAB POTENTIAL: Good  CLINICAL DECISION MAKING: Evolving/moderate complexity  EVALUATION COMPLEXITY: Moderate   GOALS: Goals reviewed with patient? Yes  SHORT TERM GOALS: Target date: 02/01/2024  Patient will be independent and compliant with initial HEP.  Baseline: initial HEP issued  Goal status: INITIAL  2.  Patient will be able to complete sit to stand transfer without UE support to improve ease of transfers.  Baseline: requires UE support Goal status: INITIAL  3.  Patient will complete TUG on </= 15 seconds with LRAD.  Baseline: see above Goal status: INITIAL    LONG TERM GOALS: Target date: 03/02/24  Patient will be able to negotiate curbs with [facility] to improve independence with community ambulation.  Baseline: utilizes RW for community ambulation currently.  Goal status: INITIAL  2.  Patient will self report ability to ambulate in her back yard with LRAD.  Baseline: avoiding walking in backyard due to fear of falling  Goal status: INITIAL  3.  Patient will score >/= 45/56 on the BERG to signify reduction in fall risk.  Baseline: see above  Goal status: INITIAL  4.  Patient will ambulate household distances  independently. Baseline: Mod I (RW or cane currently)  Goal status: INITIAL  5.  Patient will be independent with advanced home program to progress/maintain current level of function.   Baseline: initial HEP issued  Goal status: INITIAL    PLAN:  PT FREQUENCY: 2x/week  PT DURATION: 8 weeks  PLANNED INTERVENTIONS: 97164- PT Re-evaluation, 97750- Physical Performance Testing, 97110-Therapeutic exercises, 97530- Therapeutic activity, [id number]- Neuromuscular re-education, 97535- Self Care, [id number]- Manual therapy, [id number]- Gait training, [phone] (1-2 muscles), 20561 (3+ muscles)- Dry Needling, Cryotherapy, and Moist heat  Lucie Meeter, PT, DPT, ATC 01/16/24 2:47 PM  Lucie Meeter, PT, DPT, ATC 04/15/24 2:18 PM

## 2024-01-18 ENCOUNTER — Encounter: Payer: Self-pay | Admitting: Family Medicine

## 2024-01-18 ENCOUNTER — Ambulatory Visit: Payer: Self-pay

## 2024-01-18 ENCOUNTER — Ambulatory Visit (INDEPENDENT_AMBULATORY_CARE_PROVIDER_SITE_OTHER)

## 2024-01-18 ENCOUNTER — Ambulatory Visit: Admitting: Family Medicine

## 2024-01-18 VITALS — BP 145/72 | HR 80 | Ht 62.0 in | Wt 143.1 lb

## 2024-01-18 DIAGNOSIS — R0602 Shortness of breath: Secondary | ICD-10-CM

## 2024-01-18 DIAGNOSIS — J432 Centrilobular emphysema: Secondary | ICD-10-CM

## 2024-01-18 DIAGNOSIS — R0789 Other chest pain: Secondary | ICD-10-CM

## 2024-01-18 DIAGNOSIS — R0902 Hypoxemia: Secondary | ICD-10-CM

## 2024-01-18 DIAGNOSIS — J8410 Pulmonary fibrosis, unspecified: Secondary | ICD-10-CM | POA: Diagnosis not present

## 2024-01-18 NOTE — Therapy (Incomplete)
 OUTPATIENT PHYSICAL THERAPY LOWER EXTREMITY TREATMENT   Patient Name: [name] [name] MRN: [id number] DOB:1940/09/20, 83 y.o., female Today's Date: 01/18/2024  END OF SESSION:    Past Medical History:  Diagnosis Date   Anxiety    intermittent   Arthritis    blood in stool    CKD (chronic kidney disease), stage III (HCC)    COPD (chronic obstructive pulmonary disease) (HCC)    Diverticulosis    Emphysema lung (HCC)    Fibromyalgia    GERD (gastroesophageal reflux disease)    Glaucoma    Hemorrhoids    HSV-2 (herpes simplex virus 2) infection 2009   Hypothyroidism    OSA on CPAP 07/27/2006   Osteoporosis    Rectal bleeding    with hemorrhoids   Past Surgical History:  Procedure Laterality Date   BUNIONECTOMY  [id number]   CARPAL TUNNEL RELEASE Right 12/08/2016   Procedure: CARPAL TUNNEL RELEASE;  Surgeon: Murrell Kuba, MD;  Location: [facility];  Service: Orthopedics;  Laterality: Right;  REG/FAB   CARPAL TUNNEL RELEASE Left 07/02/2021   Procedure: CARPAL TUNNEL RELEASE LEFT;  Surgeon: Murrell Kuba, MD;  Location: [facility];  Service: Orthopedics;  Laterality: Left;  45 MIN   CERVICAL FUSION  2008   EYE SURGERY     cataract and glaucoma   hemorrhoids removed     MENISCUS REPAIR Left    TOTAL HIP ARTHROPLASTY Right 07/03/2023   Procedure: TOTAL HIP ARTHROPLASTY ANTERIOR APPROACH;  Surgeon: Yvone Rush, MD;  Location: [facility];  Service: Orthopedics;  Laterality: Right;   TRIGGER FINGER RELEASE  2008   Patient Active Problem List   Diagnosis Date Noted   Pre-operative clearance 05/26/2023   Primary osteoarthritis of left knee 02/11/2020   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   Right arm pain 03/07/2019   Diverticulosis 03/06/2019   CKD (chronic kidney disease), stage III (HCC) 09/06/2018   Allergic rhinitis 08/13/2018   Hypothyroid 02/05/2018   Osteopenia with high risk of fracture 04/26/2017   Bilateral tinnitus 04/25/2017    Restrictive lung disease 04/07/2017   Centrilobular emphysema (HCC) 01/19/2017   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   Trigger ring finger of right hand 08/12/2016   Genital herpes 02/16/2016   Primary osteoarthritis of right hip 07/27/2015   ASCVD (arteriosclerotic cardiovascular disease) 02/05/2015   Rectal bleeding 02/05/2015   Pulmonary nodules 02/05/2015   Glaucoma 01/06/2015   Fibromyalgia 01/06/2015   Elevated cholesterol with elevated triglycerides 01/06/2015   Obstructive sleep apnea on CPAP 01/06/2015   Osteoporosis 01/06/2015    PCP: Alvan Dorothyann BIRCH, MD  REFERRING PROVIDER: Yvone Rush, MD   REFERRING DIAG: [phone] (ICD-10-CM) - Left hip pain   THERAPY DIAG:  No diagnosis found.  Rationale for Evaluation and Treatment: Rehabilitation  ONSET DATE: 10/06/23  SUBJECTIVE:   SUBJECTIVE STATEMENT: Patient reports the hip is getting better. She feels that her breathing is getting worse from the COPD (more dyspnea on exertion) and has plans to f/u with PCP regarding this.   EVAL: Patient reports she had a fall in May where she tripped while walking and fell. She sustained a Lt intertrochanteric fracture and underwent surgery on 10/06/23. She was in the hospital for a week and then went to a SNF for 2 weeks. She just completed home health PT. Patient feels that she is holding herself back due to fear of falling and is more reliant on her RW. She is using her cane around the house.  Prior to this injury she would use the cane for community ambulation and at home she would mostly go without an AD. She does feel that the the left leg is still weak and doesn't feel stable walking on that leg. The back of the hip will ache and stays fairly constant. Does still have pain in the Rt thigh related to recent THA. Patient is afraid of falling, so has not been in the back yard. She was seen by Dr. Yvone this week and was told that her hip is healing well, but not fully  healed. She will f/u again in 3 months and was told that she does not have any restrictions as it relates to the hip.   PERTINENT HISTORY: Rt THA  Osteoporosis  Lt intertrochanteris hip fracture s/p ORIF 10/06/23 COPD  Fibromyalgia PAIN:  Are you having pain? Yes: NPRS scale: none currently; at worst 3 Pain location: Lt posterior hip; low back Pain description: ache Aggravating factors: standing/walking Relieving factors: changing position  PRECAUTIONS: Fall and Other: osteoporosis   RED FLAGS: None   WEIGHT BEARING RESTRICTIONS: No  FALLS:  Has patient fallen in last 6 months? Yes. Number of falls 1- patient reports she just stubbed her toe, which caused LOB  LIVING ENVIRONMENT: Lives with: lives alone Lives in: House/apartment Stairs: No Has following equipment at home: Single point cane and Environmental consultant - 2 wheeled  OCCUPATION: retired   PLOF: Independent  PATIENT GOALS: I want to be mobile. I need my balance and strength back.   NEXT MD VISIT: November 2025   OBJECTIVE:  Note: Objective measures were completed at Evaluation unless otherwise noted.  DIAGNOSTIC FINDINGS: none on file   PATIENT SURVEYS:  ABC scale: The Activities-Specific Balance Confidence (ABC) Scale 0% 10 20 30  40 50 60 70 80 90 100% No confidence<->completely confident  "How confident are you that you will not lose your balance or become unsteady when you . . .   Date tested 01/04/24  Walk around the house 100%  2. Walk up or down stairs 90%  3. Bend over and pick up a slipper from in front of a closet floor 90%  4. Reach for a small can off a shelf at eye level 100%  5. Stand on tip toes and reach for something above your head 100%  6. Stand on a chair and reach for something 0%  7. Sweep the floor 90%  8. Walk outside the house to a car parked in the driveway 100%  9. Get into or out of a car 100%  10. Walk across a parking lot to the mall 90%  11. Walk up or down a ramp 100%  12. Walk  in a crowded mall where people rapidly walk past you 80%  13. Are bumped into by people as you walk through the mall 0%  14. Step onto or off of an escalator while you are holding onto the railing 70%  15. Step onto or off an escalator while holding onto parcels such that you cannot hold onto the railing 0%  16. Walk outside on icy sidewalks 0%  Total: #/16  69 %     COGNITION: Overall cognitive status: Within functional limits for tasks assessed     SENSATION: Not tested   POSTURE: increased thoracic kyphosis    LOWER EXTREMITY ROM:  Active ROM Right eval Left eval  Hip flexion    Hip extension    Hip abduction    Hip adduction  Hip internal rotation    Hip external rotation    Knee flexion    Knee extension    Ankle dorsiflexion    Ankle plantarflexion    Ankle inversion    Ankle eversion     (Blank rows = not tested)  LOWER EXTREMITY MMT:  MMT Right eval Left eval  Hip flexion 4 4-  Hip extension    Hip abduction 4- in sitting  4- in sitting   Hip adduction    Hip internal rotation    Hip external rotation    Knee flexion 5 4+  Knee extension 5 4+  Ankle dorsiflexion    Ankle plantarflexion    Ankle inversion    Ankle eversion     (Blank rows = not tested)   FUNCTIONAL TESTS:  5 times sit to stand: 12.29 seconds; UE support  Timed up and go (TUG): 18.3 seconds with RW  Berg Balance Scale:  Item Test date: 01/04/24 Test date:  Test date:   Sitting to standing 3. able to stand independently using hands Insert OPRCBERGREEVAL SmartPhrase at re-test date Insert OPRCBERGREEVAL SmartPhrase at re-test date  2. Standing unsupported 4. able to stand safely for 2 minutes    3. Sitting with back unsupported, feet supported 4. able to sit safely and securely for 2 minutes    4. Standing to sitting 3. controls descent by using hands    5. Pivot transfer  3. able to transfer safely with definite need of hands    6. Standing unsupported with eyes closed 4. able  to stand 10 seconds safely    7. Standing unsupported with feet together 4. able to place feet together independently and stand 1 minute safely    8. Reaching forward with outstretched arms while standing 3. can reach forward 12 cm (5 inches)    9. Pick up object from the floor from standing 4. able to pick up slipper safely and easily    10. Turning to look behind over left and right shoulders while standing 4. looks behind from both sides and weight shifts well    11. Turn 360 degrees 2. able to turn 360 degrees safely but slowly    12. Place alternate foot on step or stool while standing unsupported 1. able to complete > 2 steps needs minimal assist    13. Standing unsupported one foot in front 2. able to take small step independently and hold 30 seconds    14. Standing on one leg 1. tries to lift leg unable to hold 3 seconds but remains standing independently.      Total Score 42/56 Total Score  Total Score      GAIT: Distance walked: 20 ft  Assistive device utilized: Walker - 2 wheeled Level of assistance: Modified independence Comments: decreased foot clearance, antalgic Lt   [facility] Adult PT Treatment:                                                DATE: 01/16/24 Beginning of session: SpO2: 98%  End of session: SpO2: 96%  Therapeutic Exercise: LAQ 2 x 10; 1.5 lbs  Reviewed HEP   Therapeautic Activity: Walking with SPC 1 lap in clinic; with SPC and holding container 1 lap in clinic  Walking med center hallway with SPC; SpO2: 90% at end of walking down hallway; 91% at  end of walking back    Self Care: Recommended f/u with PCP regarding COPD and SOB.  Recommendation on purchasing pulse oximeter to monitor oxygen at home Educated on normal SpO2 values.   [facility] Adult PT Treatment:                                                DATE: 01/11/2024 Neuromuscular re-ed: Hooklying hip add ball squeeze Hooklying hip abd + red TB Bridges + red TB around thighs --> added folded towel under  pelvis for lumbar discomfort Therapeutic Activity: Standing mini squats + seat tap on pillow stack 2x10 --> rest break b/w due to SOB 4 step ups + bilateral railing Standing:  Hip abduction Hip extension HS curls Marching  Gait: Gait training with [facility]                                                                                                                                [facility] Adult PT Treatment:                                                DATE: 01/04/24 Therapeutic Exercise: Demonstrated, performed, and issued initial HEP.      PATIENT EDUCATION:  Education details: see treatment Person educated: Patient Education method: Explanation, Demonstration, Tactile cues, Verbal cues, and Handouts Education comprehension: verbalized understanding, returned demonstration, verbal cues required, tactile cues required, and needs further education  HOME EXERCISE PROGRAM: Access Code: [email or web address] URL: [email or web address] Date: 01/04/2024 Prepared by: Lucie Meeter  Exercises - Sit to Stand with Armchair  - 1 x daily - 7 x weekly - 2 sets - 10 reps - Standing March with Counter Support  - 1 x daily - 7 x weekly - 2 sets - 10 reps - Heel Raises with Counter Support  - 1 x daily - 7 x weekly - 2 sets - 10 reps - Standing Hip Abduction with Counter Support  - 1 x daily - 7 x weekly - 2 sets - 10 reps  ASSESSMENT:  CLINICAL IMPRESSION: Patient reports overall the hip continues to improve, but feels her SOB is more limiting at this time. She demonstrates good stability when walking in clinic with SPC, but does become SOB quickly. When walking in [facility] hallway using SPC her SpO2 was at 90-91% with associated SOB requiring seated rest break to improve. It was recommended that patient f/u with PCP regarding DOE with patient verbalizing understanding. Was recommended that she could begin to use her [facility] for household ambulation and continue RW for community ambulation with  patient verbalizing understanding.   EVAL: Patient is a 83 y.o. female who was seen today for physical  therapy evaluation and treatment for s/p Lt hip ORIF on 10/06/23 after sustaining intertrochanteric fracture from a fall. She has undergone physical therapy in a skilled nursing facility and at home. She is currently ambulating majority of time using her RW and occasionally her SPC. Prior to her fall she utilized her [facility] for community ambulation, but was mostly independent with household ambulation. She also reports being fearful of falling and has avoiding certain activities at this time due to this fear. She scores at an increased fall risk based upon her TUG, BERG, and 5 x STS. In addition she has bilateral hip weakness and gait abnormalities. She will benefit from skilled PT to address the above stated deficits in order to optimize her function and assist in overall pain reduction.   OBJECTIVE IMPAIRMENTS: Abnormal gait, cardiopulmonary status limiting activity, decreased activity tolerance, decreased balance, decreased endurance, decreased knowledge of condition, decreased mobility, difficulty walking, decreased ROM, decreased strength, improper body mechanics, postural dysfunction, and pain.   ACTIVITY LIMITATIONS: carrying, lifting, bending, standing, squatting, stairs, transfers, toileting, and locomotion level  PARTICIPATION LIMITATIONS: cleaning, driving, shopping, community activity, and yard work  PERSONAL FACTORS: Age, Fitness, Time since onset of injury/illness/exacerbation, and 3+ comorbidities: see PMH/PSH above  are also affecting patient's functional outcome.   REHAB POTENTIAL: Good  CLINICAL DECISION MAKING: Evolving/moderate complexity  EVALUATION COMPLEXITY: Moderate   GOALS: Goals reviewed with patient? Yes  SHORT TERM GOALS: Target date: 02/01/2024  Patient will be independent and compliant with initial HEP.  Baseline: initial HEP issued  Goal status: INITIAL  2.   Patient will be able to complete sit to stand transfer without UE support to improve ease of transfers.  Baseline: requires UE support Goal status: INITIAL  3.  Patient will complete TUG on </= 15 seconds with LRAD.  Baseline: see above Goal status: INITIAL    LONG TERM GOALS: Target date: 03/02/24  Patient will be able to negotiate curbs with [facility] to improve independence with community ambulation.  Baseline: utilizes RW for community ambulation currently.  Goal status: INITIAL  2.  Patient will self report ability to ambulate in her back yard with LRAD.  Baseline: avoiding walking in backyard due to fear of falling  Goal status: INITIAL  3.  Patient will score >/= 45/56 on the BERG to signify reduction in fall risk.  Baseline: see above  Goal status: INITIAL  4.  Patient will ambulate household distances independently. Baseline: Mod I (RW or cane currently)  Goal status: INITIAL  5.  Patient will be independent with advanced home program to progress/maintain current level of function.  Baseline: initial HEP issued  Goal status: INITIAL    PLAN:  PT FREQUENCY: 2x/week  PT DURATION: 8 weeks  PLANNED INTERVENTIONS: 97164- PT Re-evaluation, 97750- Physical Performance Testing, 97110-Therapeutic exercises, 97530- Therapeutic activity, [id number]- Neuromuscular re-education, 97535- Self Care, [id number]- Manual therapy, [id number]- Gait training, [phone] (1-2 muscles), 20561 (3+ muscles)- Dry Needling, Cryotherapy, and Moist heat  PLAN FOR NEXT SESSION: update HEP to include static balance activity; gait training with SPC. Hip strengthening. Work on sit to stand progressing to no UE support as able; CHECK SpO2 regularly.   Kmarion Rawl, PT, DPT, ATC 01/18/24 7:53 AM

## 2024-01-18 NOTE — Progress Notes (Signed)
 Established Patient Office Visit  Subjective  Patient ID: [name] [name], female    DOB: 01-16-1941  Age: 83 y.o. MRN: [id number]  Chief Complaint  Patient presents with   COPD   Shortness of Breath    HPI   Discussed the use of AI scribe software for clinical note transcription with the patient, who gave verbal consent to proceed.  History of Present Illness [name] is an 83 year old female with central lobular emphysema who presents with increased shortness of breath and cough.  Dyspnea - Increased shortness of breath, particularly during physical therapy and ambulation with a cane - Becomes winded quickly, especially when walking outside or carrying items with a walker - Sensation of chest heaviness, described as a 'little weight,' more pronounced with movement but also present at rest - No palpitations or racing heart sensations  Cough - Persistent dry cough since late May, following hospitalization for hypoxemia - Cough is more frequent and remains dry, without increased phlegm production  Mucosal dryness and hydration - Consistently dry mouth - Increased water  intake to address dehydration  Inhaler use - Daily use of Stiolto inhaler - Increased frequency of albuterol  inhaler use, sometimes a couple of times per day but not daily  Environmental exposures - New cat in the home, possibly with asthma - Home requires deep cleaning due to inability to perform cleaning tasks; daughter expected to assist  Nutritional intake - Consuming two meals per day, focusing on balanced meals - Includes protein shakes - Difficulty maintaining adequate intake but attempts to eat balanced meals    {History (Optional):23778}  ROS    Objective:     BP (!) 145/72   Pulse 80   Ht 5' 2 (1.575 m)   Wt 143 lb 1.9 oz (64.9 kg)   SpO2 97%   BMI 26.18 kg/m  {Vitals History (Optional):23777}  Physical Exam Vitals and nursing note reviewed.   Constitutional:      Appearance: Normal appearance.  HENT:     Head: Normocephalic and atraumatic.  Eyes:     Conjunctiva/sclera: Conjunctivae normal.  Cardiovascular:     Rate and Rhythm: Normal rate and regular rhythm.  Pulmonary:     Effort: Pulmonary effort is normal.     Breath sounds: Normal breath sounds.  Skin:    General: Skin is warm and dry.  Neurological:     Mental Status: She is alert.  Psychiatric:        Mood and Affect: Mood normal.      No results found for any visits on 01/18/24.  {Labs (Optional):23779}  The ASCVD Risk score (Arnett DK, et al., 2019) failed to calculate for the following reasons:   The 2019 ASCVD risk score is only valid for ages 63 to 38    Assessment & Plan:   Problem List Items Addressed This Visit       Respiratory   Centrilobular emphysema (HCC)   Relevant Orders   CBC   Comprehensive metabolic panel with GFR   DG Chest 2 View   TSH   Other Visit Diagnoses       SOB (shortness of breath)    -  Primary   Relevant Orders   CBC   Comprehensive metabolic panel with GFR   DG Chest 2 View   TSH     Hypoxia       Relevant Orders   CBC   Comprehensive metabolic panel with GFR   DG Chest 2 View  TSH     Chest pressure       Relevant Orders   CBC   Comprehensive metabolic panel with GFR   DG Chest 2 View   TSH      Assessment and Plan Assessment & Plan Centrilobular emphysema with hypoxemia and exertional dyspnea Chronic centrilobular emphysema with increased exertional dyspnea and hypoxemia during physical activity. Differential includes potential cardiac involvement. Current inhaler regimen includes Stiolto and occasional albuterol . Potential environmental factors include exposure to cat allergens. - Perform six-minute walk test to assess oxygen saturation during exertion. - Order chest x-ray to evaluate lung condition. - Consider chest CT if x-ray findings are inconclusive. - Perform EKG to rule out cardiac  causes of chest heaviness. - Review and adjust inhaler regimen based on test results. - Obtain CBC, CMP, and TSH to assess overall health and rule out anemia. - Discuss environmental modifications such as air purifiers to reduce allergen exposure.  Chest pain (non-cardiac, exertional) Exertional chest heaviness with differential including pulmonary versus cardiac origin. No associated palpitations or tachycardia. Previous EKG available for comparison. - Perform EKG to compare with previous results and rule out cardiac causes. - Evaluate results of six-minute walk test and imaging to determine pulmonary contribution.  Dry mouth Chronic dry mouth possibly contributing to dry cough. Increased water  intake reported. - Encourage continued hydration to manage dry mouth symptoms.  EKG shows rate of 83 bpm, NSR, no change from prior 05/2023.     Return if symptoms worsen or fail to improve.   I spent 45 minutes on the day of the encounter to include pre-visit record review, face-to-face time with the patient and post visit ordering of test.   Dorothyann Byars, MD

## 2024-01-19 ENCOUNTER — Ambulatory Visit: Payer: Self-pay | Admitting: Family Medicine

## 2024-01-19 LAB — CBC
Hematocrit: 41.8 % (ref 34.0–46.6)
Hemoglobin: 13.8 g/dL (ref 11.1–15.9)
MCH: 31.7 pg (ref 26.6–33.0)
MCHC: 33 g/dL (ref 31.5–35.7)
MCV: 96 fL (ref 79–97)
Platelets: 220 x10E3/uL (ref 150–450)
RBC: 4.35 x10E6/uL (ref 3.77–5.28)
RDW: 13.1 % (ref 11.7–15.4)
WBC: 4.1 x10E3/uL (ref 3.4–10.8)

## 2024-01-19 LAB — TSH: TSH: 2.92 u[IU]/mL (ref 0.450–4.500)

## 2024-01-19 NOTE — Progress Notes (Signed)
 Your lab work is within acceptable range and there are no concerning findings.   ?

## 2024-01-23 ENCOUNTER — Ambulatory Visit: Payer: Self-pay

## 2024-01-23 NOTE — Progress Notes (Signed)
 Hi [name] [name],   Chest x-ray just shows some emphysema you do have a calcified little granuloma or scar in the left lung.  They did see a little bit of plaque buildup in the aorta.  And then just all like your hardware in your neck.  But they did not see any pneumonia or extra fluid in the chest which is great.

## 2024-01-24 ENCOUNTER — Telehealth: Payer: Self-pay | Admitting: Family Medicine

## 2024-01-24 DIAGNOSIS — J432 Centrilobular emphysema: Secondary | ICD-10-CM

## 2024-01-24 MED ORDER — DOXYCYCLINE HYCLATE 100 MG PO TABS: 100.0000 mg | ORAL_TABLET | Freq: Two times a day (BID) | ORAL | 0 refills | Status: DC

## 2024-01-24 MED ORDER — PREDNISONE 20 MG PO TABS: 40.0000 mg | ORAL_TABLET | Freq: Every day | ORAL | 0 refills | Status: DC

## 2024-01-24 NOTE — Telephone Encounter (Signed)
 LVM regarding cancelling upcoming PT visits due to ongoing unrelated SOB that is being treated by PCP.  Lizandro Spellman, PT, DPT, ATC 01/24/24 1:57 PM

## 2024-01-24 NOTE — Telephone Encounter (Signed)
 Please call patient and even let her know that even though the chest x-ray came back showing no pneumonia I would go ahead and put her on some prednisone  as well as an antibiotic to see if we can get her breathing a little bit more easily.  Make sure you are using the Stiolto daily and the albuterol  in between if needed.  If she is not feeling better after the weekend please let us  know.

## 2024-01-24 NOTE — Progress Notes (Signed)
 Yes please call imaging and see if they can move up the reading on the chest x-ray.

## 2024-01-25 ENCOUNTER — Ambulatory Visit: Payer: Self-pay

## 2024-01-25 NOTE — Telephone Encounter (Signed)
 Attempted call to patient.left a voice mail to return our call.

## 2024-01-29 NOTE — Telephone Encounter (Signed)
 Patient returned call. Read Dr. Vernida message to the patient and patient understood. Stated she will go to the pharmacy and pick up the medications.

## 2024-01-29 NOTE — Telephone Encounter (Signed)
Attempted call to patient. Again left a voice mail message requesting a return call.

## 2024-01-30 ENCOUNTER — Ambulatory Visit: Payer: Self-pay

## 2024-02-01 ENCOUNTER — Ambulatory Visit: Payer: Self-pay

## 2024-02-12 NOTE — Addendum Note (Signed)
 Addended by: BONNY JON DEL on: 02/12/2024 11:24 AM   Modules accepted: Orders

## 2024-02-21 ENCOUNTER — Encounter: Payer: Self-pay | Admitting: Family Medicine

## 2024-02-21 ENCOUNTER — Ambulatory Visit: Admitting: Family Medicine

## 2024-02-21 VITALS — BP 126/78 | HR 93 | Ht 62.0 in | Wt 144.0 lb

## 2024-02-21 DIAGNOSIS — R0602 Shortness of breath: Secondary | ICD-10-CM

## 2024-02-21 DIAGNOSIS — J432 Centrilobular emphysema: Secondary | ICD-10-CM | POA: Diagnosis not present

## 2024-02-21 DIAGNOSIS — E871 Hypo-osmolality and hyponatremia: Secondary | ICD-10-CM

## 2024-02-21 DIAGNOSIS — R0902 Hypoxemia: Secondary | ICD-10-CM | POA: Diagnosis not present

## 2024-02-21 NOTE — Progress Notes (Signed)
 Established Patient Office Visit  Subjective  Patient ID: [name] [name], female    DOB: [birth year]  Age: 83 y.o. MRN: [id number]  Chief Complaint  Patient presents with   Hypothyroidism    HPI  Discussed the use of AI scribe software for clinical note transcription with the patient, who gave verbal consent to proceed.  History of Present Illness [name] [name] is an 83 year old female with emphysema who presents with persistent shortness of breath and oxygen desaturation.  Dyspnea and oxygen desaturation - Persistent shortness of breath and oxygen desaturation for the past two weeks - Oxygen saturation does not rise above 94% and drops as low as 81%, primarily during movement such as getting up to go to the bathroom - Takes 5 to 10 minutes for oxygen levels to return above 90% after resting - Chest tightness and lightheadedness upon exertion - Symptoms limit ability to perform daily activities such as cleaning and walking  Respiratory support and medication use - Uses CPAP machine at night and occasionally during the day to assist with breathing - Uses inhaler daily - Increased use of albuterol  to once or twice a day, previously used weekly - completed  prednisone  and antibiotic about a month ago, without significant improvement in symptoms  Cough and edema - History of coughing, which has improved - No significant swelling except for hands, attributed to previous surgeries  Pulmonary imaging and findings - Previous chest x-ray and EKG showed emphysema and a nodule at the apex of the lung  Environmental and functional limitations - Lives alone with a dog and a cat, suspects possible contribution to symptoms due to allergies - Daughter occasionally assists with household chores - Difficulty maintaining home due to breathing difficulties  She had actually started seeing pulmonology right before COVID hit and then her appointment was canceled and just never  returned.    ROS    Objective:     BP 126/78   Pulse 93   Ht 5' 2 (1.575 m)   Wt 144 lb 0.6 oz (65.3 kg)   SpO2 94%   BMI 26.35 kg/m    Physical Exam Vitals and nursing note reviewed.  Constitutional:      Appearance: Normal appearance.  HENT:     Head: Normocephalic and atraumatic.  Eyes:     Conjunctiva/sclera: Conjunctivae normal.  Cardiovascular:     Rate and Rhythm: Normal rate and regular rhythm.  Pulmonary:     Effort: Pulmonary effort is normal.     Breath sounds: Normal breath sounds.  Skin:    General: Skin is warm and dry.  Neurological:     Mental Status: She is alert.  Psychiatric:        Mood and Affect: Mood normal.      No results found for any visits on 02/21/24.    The ASCVD Risk score (Arnett DK, et al., 2019) failed to calculate for the following reasons:   The 2019 ASCVD risk score is only valid for ages 35 to 29    Assessment & Plan:   Problem List Items Addressed This Visit       Respiratory   Centrilobular emphysema (HCC) - Primary    oxygen saturation dropping to 81% with exertion, not improving with prednisone . Increased albuterol  use. - Refer to pulmonologist for further evaluation and management. - Order chest CT to assess for additional pulmonary issues. - Perform six-minute walk test to assess exertional oxygen saturation.  6-minute walk test today.  Relevant Orders   Ambulatory referral to Pulmonology   CT Chest Wo Contrast   Other Visit Diagnoses       SOB (shortness of breath)       Relevant Orders   Ambulatory referral to Pulmonology   CT Chest Wo Contrast     Hypoxia       Relevant Orders   Ambulatory referral to Pulmonology   CT Chest Wo Contrast     Low sodium levels       Relevant Orders   CMP14+EGFR     Low calcium  levels       Relevant Orders   CMP14+EGFR      Assessment and Plan Assessment & Plan  Pulmonary nodule, left (apex) Previously identified nodule with no concerning  features on prior imaging. Further evaluation needed to rule out progression or associated pathology. - Order chest CT to evaluate nodule and assess for changes.  Obstructive sleep apnea, on CPAP Managed with CPAP. Adheres to nightly use.  Allergic rhinitis Possible contribution to respiratory symptoms. Environmental allergies with potential exacerbation due to seasonal changes and exposure to allergens.  Follow-Up Follow-up needed to assess response to interventions and further evaluate respiratory status.    Return in about 3 months (around 05/23/2024).    Dorothyann Byars, MD

## 2024-02-21 NOTE — Assessment & Plan Note (Addendum)
 oxygen saturation dropping to 81% with exertion, not improving with prednisone . Increased albuterol  use. - Refer to pulmonologist for further evaluation and management. - Order chest CT to assess for additional pulmonary issues. - Perform six-minute walk test to assess exertional oxygen saturation. - continue stiolto and prn albuterol   6-minute walk test today.

## 2024-02-22 ENCOUNTER — Ambulatory Visit: Payer: Self-pay | Admitting: Family Medicine

## 2024-02-22 LAB — CMP14+EGFR
ALT: 13 IU/L (ref 0–32)
AST: 17 IU/L (ref 0–40)
Albumin: 4 g/dL (ref 3.7–4.7)
Alkaline Phosphatase: 123 IU/L (ref 48–129)
BUN/Creatinine Ratio: 21 (ref 12–28)
BUN: 16 mg/dL (ref 8–27)
Bilirubin Total: 1.2 mg/dL (ref 0.0–1.2)
CO2: 26 mmol/L (ref 20–29)
Calcium: 9.9 mg/dL (ref 8.7–10.3)
Chloride: 102 mmol/L (ref 96–106)
Creatinine, Ser: 0.75 mg/dL (ref 0.57–1.00)
Globulin, Total: 2.9 g/dL (ref 1.5–4.5)
Glucose: 77 mg/dL (ref 70–99)
Potassium: 4.3 mmol/L (ref 3.5–5.2)
Sodium: 142 mmol/L (ref 134–144)
Total Protein: 6.9 g/dL (ref 6.0–8.5)
eGFR: 78 mL/min/1.73 (ref >59)

## 2024-02-22 NOTE — Progress Notes (Signed)
 Your lab work is within acceptable range and there are no concerning findings.   ?

## 2024-02-27 ENCOUNTER — Ambulatory Visit (INDEPENDENT_AMBULATORY_CARE_PROVIDER_SITE_OTHER)

## 2024-02-27 DIAGNOSIS — R0602 Shortness of breath: Secondary | ICD-10-CM

## 2024-02-27 DIAGNOSIS — I700 Atherosclerosis of aorta: Secondary | ICD-10-CM | POA: Diagnosis not present

## 2024-02-27 DIAGNOSIS — R918 Other nonspecific abnormal finding of lung field: Secondary | ICD-10-CM | POA: Diagnosis not present

## 2024-02-27 DIAGNOSIS — R0902 Hypoxemia: Secondary | ICD-10-CM

## 2024-02-27 DIAGNOSIS — J432 Centrilobular emphysema: Secondary | ICD-10-CM

## 2024-02-27 DIAGNOSIS — J439 Emphysema, unspecified: Secondary | ICD-10-CM | POA: Diagnosis not present

## 2024-02-29 ENCOUNTER — Telehealth (HOSPITAL_BASED_OUTPATIENT_CLINIC_OR_DEPARTMENT_OTHER): Payer: Self-pay

## 2024-02-29 ENCOUNTER — Other Ambulatory Visit (HOSPITAL_BASED_OUTPATIENT_CLINIC_OR_DEPARTMENT_OTHER): Payer: Self-pay | Admitting: Family Medicine

## 2024-02-29 DIAGNOSIS — Z1231 Encounter for screening mammogram for malignant neoplasm of breast: Secondary | ICD-10-CM

## 2024-03-01 DIAGNOSIS — H401111 Primary open-angle glaucoma, right eye, mild stage: Secondary | ICD-10-CM | POA: Diagnosis not present

## 2024-03-01 DIAGNOSIS — H401122 Primary open-angle glaucoma, left eye, moderate stage: Secondary | ICD-10-CM | POA: Diagnosis not present

## 2024-03-01 NOTE — Progress Notes (Signed)
 Hi Rilynne,  Overall chest CT looks okay they did not see any underlying pneumonia etc.  You did have a few scattered nodules the largest being about 6 mm but these were also there back in 2018 on a prior CT.  So it is reassuring that these are probably just related to scarring in the lungs.  They did notate emphysema, a little worse on the right.  And a little bit of scar tissue at the tops of the lungs.  Just make sure you are continuing to use the Stiolto every day and then the albuterol  as needed..  We can also do another short round of prednisone  if you tolerate that and that can help with your breathing as well.

## 2024-03-04 ENCOUNTER — Ambulatory Visit: Payer: Self-pay

## 2024-03-04 NOTE — Telephone Encounter (Signed)
 E2C2 Pulmonary Triage - Initial Assessment Questions "Chief Complaint (e.g., cough, sob, wheezing, fever, chills, sweat or additional symptoms) *Go to specific symptom protocol after initial questions. Shortness of breath  "How long have symptoms been present?" Chronic but worsening since May 2025  Have you tested for COVID or Flu? Note: If not, ask patient if a home test can be taken. If so, instruct patient to call back for positive results. No  MEDICINES:   "Have you used any OTC meds to help with symptoms?" No If yes, ask "What medications?"   "Have you used your inhalers/maintenance medication?" Yes If yes, "What medications?"   If inhaler, ask "How many puffs and how often?" Note: Review instructions on medication in the chart. Albuterol  Daily 3-4 doses   OXYGEN: "Do you wear supplemental oxygen?" No If yes, "How many liters are you supposed to use?"   "Do you monitor your oxygen levels?" Yes If yes, What is your reading (oxygen level) today? 93, last evening down to 89  What is your usual oxygen saturation reading?  (Note: Pulmonary O2 sats should be 90% or greater) 90's  Copied from CRM #[id number]. Topic: Clinical - Red Word Triage >> Mar 04, 2024  3:39 PM Leila BROCKS wrote: Red Word that prompted transfer to Nurse Triage: Patient [phone] states was referred by Dr. Alvan to see Dr. Fayetta for Centrilobular emphysema, SOB (shortness of breath), and Hypoxia. Patient has not heard from the office to schedule. Per CAL, can scheduled patient with Dr. Shelah. Patient last saw Dr. Shelah in 01/27/20. Patient states having more shortness of breath- hard and preventing from doing task, wheezing, coughing, can be difficult to talk sometimes, oxygen saturation is dropping. Patient was seen with pcp. Please advise. Reason for Disposition  [1] MODERATE longstanding difficulty breathing (e.g., speaks in phrases, SOB even at rest, pulse 100-120) AND [2] SAME as normal  Answer  Assessment - Initial Assessment Questions Additional info: 1) Calling to reestablish care with pulmonology. Triage complete, transferred patient to PAS to schedule new patient appointment. Patient aware of reasons to proceed to urgent care/ER.    1. RESPIRATORY STATUS: Describe your breathing? (e.g., wheezing, shortness of breath, unable to speak, severe coughing)      Shortness of breath increasing with ambulation and house work.  2. ONSET: When did this breathing problem begin?      chronic 3. PATTERN Does the difficult breathing come and go, or has it been constant since it started?      Intermittent but worsening since May 2025 4. SEVERITY: How bad is your breathing? (e.g., mild, moderate, severe)      Was moderate, now mild speaking in full sentences on the call 5. RECURRENT SYMPTOM: Have you had difficulty breathing before? If Yes, ask: When was the last time? and What happened that time?      yes 6. CARDIAC HISTORY: Do you have any history of heart disease? (e.g., heart attack, angina, bypass surgery, angioplasty)      yes 7. LUNG HISTORY: Do you have any history of lung disease?  (e.g., pulmonary embolus, asthma, emphysema)     yes 8. CAUSE: What do you think is causing the breathing problem?      emphysema 9. OTHER SYMPTOMS: Do you have any other symptoms? (e.g., chest pain, cough, dizziness, fever, runny nose)     Cough but almost resolved.  Protocols used: Breathing Difficulty-A-AH

## 2024-03-18 ENCOUNTER — Ambulatory Visit (INDEPENDENT_AMBULATORY_CARE_PROVIDER_SITE_OTHER)

## 2024-03-18 VITALS — BP 118/76 | HR 91 | Temp 98.0°F | Ht 62.0 in | Wt 144.6 lb

## 2024-03-18 DIAGNOSIS — J432 Centrilobular emphysema: Secondary | ICD-10-CM

## 2024-03-18 DIAGNOSIS — G4733 Obstructive sleep apnea (adult) (pediatric): Secondary | ICD-10-CM | POA: Diagnosis not present

## 2024-03-18 DIAGNOSIS — R0600 Dyspnea, unspecified: Secondary | ICD-10-CM

## 2024-03-18 DIAGNOSIS — Z87891 Personal history of nicotine dependence: Secondary | ICD-10-CM | POA: Diagnosis not present

## 2024-03-18 DIAGNOSIS — J449 Chronic obstructive pulmonary disease, unspecified: Secondary | ICD-10-CM | POA: Diagnosis not present

## 2024-03-18 NOTE — Patient Instructions (Signed)
 Dear Ms. Kasinger;  I will do further testing for your shortness of breath: -Blood test today -Echocardiogram -Do some laps at your pace at home. -Your CT chest didn't show any concerning for pneumonia or mucus.  I will see you in 6 weeks.

## 2024-03-18 NOTE — Progress Notes (Signed)
 New Patient Pulmonology Office Visit   Subjective:  Patient ID: [name] [name], female    DOB: 07/14/1940  MRN: [id number]  Referred by: Alvan Dorothyann BIRCH, *  CC:  Chief Complaint  Patient presents with   Consult    Pt states she has been having difficulty with breathing, since surgery pt had in may Breathing started getting worse SOB w/ any activity pt does Dry cough occasionally     Discussed the use of AI scribe software for clinical note transcription with the patient, who gave verbal consent to proceed.  History of Present Illness [name] [name] is an 83 year old female with COPD who presents with worsening shortness of breath.  Her dyspnea has worsened since May 2025, following a hip replacement and femur fracture. She experiences increased shortness of breath and a dry cough. Cough has improved with prednisone  and azithromycin  in October 2025, but she has continued to have dyspnea on exertion and she is not back to baseline.  She uses Stiolto daily and albuterol  almost daily, one to two times per day, depending on activity level. Wheezing and cough have improved, but she still has a scratchy throat and postnasal drip.  Recent chest X-ray and CT scan show no significant changes or signs of pneumonia, mucus plugging. Small nodules on CT have remained stable compared to 2018.  She has reported her Oxygen levels drop to low high 80s with activity but improve to 92-94% at rest. Fatigue is present, requiring her to stop and catch her breath during activities. She lives alone and manages daily activities independently but feels exhausted after tasks like grocery shopping.  She denies a history of heart problems and has no leg swelling.  ROS  Allergies: Alphagan p [brimonidine tartrate], Netarsudil-latanoprost, Celebrex  [celecoxib ], Codeine, Penicillins, and Sulfa antibiotics  Current Outpatient Medications:    acetaminophen  (TYLENOL ) 650 MG CR tablet, Take 1  tablet (650 mg total) by mouth every 8 (eight) hours as needed for pain., Disp: , Rfl:    albuterol  (VENTOLIN  HFA) 108 (90 Base) MCG/ACT inhaler, INHALE 2 PUFFS INTO THE LUNGS EVERY 4 HOURS AS NEEDED FOR WHEEZING OR SHORTNESS OF BREATH., Disp: 18 each, Rfl: 2   atorvastatin  (LIPITOR) 10 MG tablet, TAKE 1 TABLET BY MOUTH EVERY DAY, Disp: 90 tablet, Rfl: 3   bifidobacterium infantis (ALIGN) capsule, Take 1 capsule by mouth daily., Disp: , Rfl:    Calcium -Phosphorus-Vitamin D  (CALCIUM /D3 ADULT GUMMIES PO), Take 2 each by mouth daily. 2 gummies daily with meal, Disp: , Rfl:    Cholecalciferol (VITAMIN D  PO), Take 2,000 mg by mouth daily., Disp: , Rfl:    Collagen-Vitamin C-Biotin (COLLAGEN PO), Take 1 Scoop by mouth daily., Disp: , Rfl:    docusate sodium  (COLACE) 100 MG capsule, Take 1 capsule (100 mg total) by mouth 2 (two) times daily., Disp: 30 capsule, Rfl: 0   doxycycline  (VIBRA -TABS) 100 MG tablet, Take 1 tablet (100 mg total) by mouth 2 (two) times daily., Disp: 14 tablet, Rfl: 0   DULoxetine  (CYMBALTA ) 60 MG capsule, TAKE 1 CAPSULE BY MOUTH EVERY DAY, Disp: 90 capsule, Rfl: 0   levothyroxine  (SYNTHROID ) 75 MCG tablet, TAKE 1 TABLET BY MOUTH EVERY DAY BEFORE BREAKFAST, Disp: 90 tablet, Rfl: 2   loratadine (CLARITIN) 10 MG tablet, Take 10 mg by mouth daily., Disp: , Rfl:    Menatetrenone (VITAMIN K2) 100 MCG TABS, Take 1 tablet by mouth daily., Disp: , Rfl:    Multiple Vitamin (MULTIVITAMINS PO), Take 1 tablet  by mouth daily., Disp: , Rfl:    polyethylene glycol (MIRALAX  / GLYCOLAX ) packet, Take 17 g by mouth daily as needed for moderate constipation., Disp: , Rfl:    predniSONE  (DELTASONE ) 20 MG tablet, Take 2 tablets (40 mg total) by mouth daily with breakfast., Disp: 10 tablet, Rfl: 0   Timolol  Maleate PF 0.5 % SOLN, Place 1 drop into the left eye in the morning and at bedtime., Disp: , Rfl:    Tiotropium Bromide -Olodaterol (STIOLTO RESPIMAT ) 2.5-2.5 MCG/ACT AERS, INHALE 2 PUFFS BY MOUTH INTO  THE LUNGS DAILY, Disp: 12 each, Rfl: 3   tiZANidine  (ZANAFLEX ) 2 MG tablet, Take 1 tablet (2 mg total) by mouth every 8 (eight) hours as needed for muscle spasms., Disp: 40 tablet, Rfl: 0   vitamin B-12 (CYANOCOBALAMIN) 100 MCG tablet, Take 100 mcg by mouth daily., Disp: , Rfl:  Past Medical History:  Diagnosis Date   Allergy    Anxiety    intermittent   Arthritis    blood in stool    Cataract    CKD (chronic kidney disease), stage III (HCC)    COPD (chronic obstructive pulmonary disease) (HCC)    Diverticulosis    Emphysema lung (HCC)    Fibromyalgia    GERD (gastroesophageal reflux disease)    Glaucoma    Hemorrhoids    HSV-2 (herpes simplex virus 2) infection 2009   Hypothyroidism    OSA on CPAP 07/27/2006   Osteoporosis    Rectal bleeding    with hemorrhoids   Sleep apnea 2008   [facility]   Ulcer    Past Surgical History:  Procedure Laterality Date   BUNIONECTOMY  [id number]   CARPAL TUNNEL RELEASE Right 12/08/2016   Procedure: CARPAL TUNNEL RELEASE;  Surgeon: Murrell Kuba, MD;  Location: [facility];  Service: Orthopedics;  Laterality: Right;  REG/FAB   CARPAL TUNNEL RELEASE Left 07/02/2021   Procedure: CARPAL TUNNEL RELEASE LEFT;  Surgeon: Murrell Kuba, MD;  Location: [facility];  Service: Orthopedics;  Laterality: Left;  45 MIN   CERVICAL FUSION  2008   EYE SURGERY     cataract and glaucoma   hemorrhoids removed     MENISCUS REPAIR Left    SPINE SURGERY  2003   2 disc fusions in neck   TOTAL HIP ARTHROPLASTY Right 07/03/2023   Procedure: TOTAL HIP ARTHROPLASTY ANTERIOR APPROACH;  Surgeon: Yvone Rush, MD;  Location: [facility];  Service: Orthopedics;  Laterality: Right;   TRIGGER FINGER RELEASE  2008   TUBAL LIGATION  04/03/1979   Family History  Problem Relation Age of Onset   Heart attack Father    CAD Father    Cancer Father    Heart disease Father    Hyperlipidemia Mother    Hypertension Mother    CAD Mother     Cancer Mother    Diabetes Mother    Heart disease Mother    Stroke Mother    Varicose Veins Mother    Cancer Paternal Grandmother    Diabetes Maternal Uncle    Drug abuse Daughter    ADD / ADHD Daughter    Cancer Brother    Early death Brother    Cancer Daughter    Miscarriages / Stillbirths Daughter    Diabetes Sister    Diabetes Maternal Aunt    Vision loss Maternal Aunt    Social History   Socioeconomic History   Marital status: Divorced    Spouse name: Helayne  Number of children: 3   Years of education: 16   Highest education level: Bachelor's degree (e.g., BA, AB, BS)  Occupational History   Occupation: armed forces operational officer    Comment: retired  Tobacco Use   Smoking status: Former    Current packs/day: 0.00    Average packs/day: 2.0 packs/day for 25.0 years (50.0 ttl pk-yrs)    Types: Cigarettes    Start date: 03/07/1956    Quit date: 03/07/1981    Years since quitting: 43.0    Passive exposure: Never   Smokeless tobacco: Never  Vaping Use   Vaping status: Never Used  Substance and Sexual Activity   Alcohol use: Not Currently    Comment: rare   Drug use: No   Sexual activity: Not Currently  Other Topics Concern   Not on file  Social History Narrative   Lives alone. She enjoys going to church.    Social Drivers of Corporate Investment Banker Strain: Low Risk  (05/22/2023)   Overall Financial Resource Strain (CARDIA)    Difficulty of Paying Living Expenses: Not very hard  Food Insecurity: No Food Insecurity (10/08/2023)   Received from [facility]   Hunger Vital Sign    Within the past 12 months, you worried that your food would run out before you got the money to buy more.: Never true    Within the past 12 months, the food you bought just didn't last and you didn't have money to get more.: Never true  Transportation Needs: No Transportation Needs (10/08/2023)   Received from [facility] - Transportation    Lack of Transportation (Medical):  No    Lack of Transportation (Non-Medical): No  Physical Activity: Unknown (05/22/2023)   Exercise Vital Sign    Days of Exercise per Week: 0 days    Minutes of Exercise per Session: Not on file  Stress: No Stress Concern Present (10/06/2023)   Received from [facility] - Occupational Stress Questionnaire    Feeling of Stress : Not at all  Social Connections: Unknown (07/03/2023)   Social Connection and Isolation Panel    Frequency of Communication with Friends and Family: Never    Frequency of Social Gatherings with Friends and Family: Patient declined    Attends Religious Services: More than 4 times per year    Active Member of [employer] or Organizations: Yes    Attends Engineer, Structural: More than 4 times per year    Marital Status: Divorced  Intimate Partner Violence: Not At Risk (10/06/2023)   Received from [facility]   HITS    Over the last 12 months how often did your partner physically hurt you?: Never    Over the last 12 months how often did your partner insult you or talk down to you?: Never    Over the last 12 months how often did your partner threaten you with physical harm?: Never    Over the last 12 months how often did your partner scream or curse at you?: Never       Objective:  There were no vitals taken for this visit. Wt Readings from Last 3 Encounters:  03/18/24 144 lb 9.6 oz (65.6 kg)  02/21/24 144 lb 0.6 oz (65.3 kg)  01/18/24 143 lb 1.9 oz (64.9 kg)   SpO2 Readings from Last 3 Encounters:  03/18/24 96%  02/21/24 94%  01/18/24 97%    Physical Exam Constitutional:  Appearance: Normal appearance.  HENT:     Head: Normocephalic.  Eyes:     Extraocular Movements: Extraocular movements intact.     Pupils: Pupils are equal, round, and reactive to light.  Cardiovascular:     Rate and Rhythm: Normal rate and regular rhythm.  Pulmonary:     Effort: Pulmonary effort is normal.     Breath sounds:  Normal breath sounds.     Comments: No wheezing, no crackles. Musculoskeletal:        General: Normal range of motion.     Comments: No LE edema  Skin:    General: Skin is warm.  Neurological:     General: No focal deficit present.     Mental Status: She is alert and oriented to person, place, and time.    Diagnostic Review:   CT chest 02/29/2024 1. No acute intrathoracic pathology. 2. Several scattered pulmonary nodules measure up to 6 mm and present on the CT of 2018. 3. Aortic Atherosclerosis (ICD10-I70.0) and Emphysema   Assessment & Plan:   Assessment & Plan Chronic obstructive pulmonary disease (COPD) with persistent dyspnea Centrilobular emphysema OSA on CPAP  COPD exacerbation treated with prednisone  and azithromycin  a month ago. Persistent dyspnea on exertion and with daily activities with normal CT scan. She denied sputum production and improvement of dry cough.  Given her normal CT scan I dont think her symptoms are related to her COPD. Differential includes cardiac causes. No recent cardiac evaluation. OSA in cpap machine. She has stable multiple pulmonary nodules, given her age, I dont think she will benefit for lung cancer screening.  - Ordered echocardiogram to evaluate cardiac function. - probnp - I will hold to add steroid inhalers given her age and increased pneumonia risk. - Encouraged increased physical activity at home, such as walking laps three times a day. - I will do a walking test at the next appointment. -I will order a f/u CT scan, and I will reassess if she would like to continue f/u images  Deconditioning following recent left femur fracture and hip replacement Deconditioning post-surgeries with persistent fatigue and dyspnea. Physical therapy was discontinued. Possible deconditioning contributing to symptoms. - Encouraged gradual increase in physical activity to improve conditioning. - Advised monitoring oxygen levels during activity. - Will  reassess in six weeks to evaluate progress and response to increased activity.   Return in about 6 weeks (around 04/29/2024).    Marny Patch, MD Pulmonary and Critical Care Medicine [facility]

## 2024-03-21 LAB — CARDIO IQ® NT PROBNP: NT PROBNP: 58 pg/mL (ref <450)

## 2024-03-25 ENCOUNTER — Ambulatory Visit: Payer: Self-pay

## 2024-03-27 ENCOUNTER — Ambulatory Visit (HOSPITAL_COMMUNITY)
Admission: RE | Admit: 2024-03-27 | Discharge: 2024-03-27 | Disposition: A | Discharge status: Home / Self Care | Source: Ambulatory Visit

## 2024-03-27 DIAGNOSIS — R0600 Dyspnea, unspecified: Secondary | ICD-10-CM | POA: Diagnosis not present

## 2024-03-27 DIAGNOSIS — I119 Hypertensive heart disease without heart failure: Secondary | ICD-10-CM | POA: Diagnosis not present

## 2024-03-27 DIAGNOSIS — R0609 Other forms of dyspnea: Secondary | ICD-10-CM | POA: Diagnosis not present

## 2024-03-27 LAB — ECHOCARDIOGRAM COMPLETE
Area-P 1/2: 4.41 cm2
S' Lateral: 2.2 cm
Single Plane A2C EF: 62.1 %

## 2024-03-27 NOTE — Progress Notes (Signed)
  Echocardiogram 2D Echocardiogram has been performed.  Koleen KANDICE Popper, RDCS 03/27/2024, 11:44 AM

## 2024-03-30 ENCOUNTER — Ambulatory Visit (HOSPITAL_BASED_OUTPATIENT_CLINIC_OR_DEPARTMENT_OTHER)
Admission: RE | Admit: 2024-03-30 | Discharge: 2024-03-30 | Disposition: A | Source: Ambulatory Visit | Attending: Family Medicine | Admitting: Family Medicine

## 2024-03-30 DIAGNOSIS — Z1231 Encounter for screening mammogram for malignant neoplasm of breast: Secondary | ICD-10-CM | POA: Diagnosis not present

## 2024-04-07 ENCOUNTER — Ambulatory Visit: Payer: Self-pay | Admitting: Family Medicine

## 2024-04-07 NOTE — Progress Notes (Signed)
 Please call patient. Normal mammogram.  Repeat in 1 year.

## 2024-05-06 NOTE — Progress Notes (Signed)
 "  New Patient Pulmonology Office Visit   Subjective:  Patient ID: [name] [name], female    DOB: 11-19-1940  MRN: [id number]  Referred by: Alvan Dorothyann BIRCH, MD  CC:  No chief complaint on file.  Discussed the use of AI scribe software for clinical note transcription with the patient, who gave verbal consent to proceed.  History of Present Illness [name] [name] is an 83 year old female with COPD who presents with worsening shortness of breath.  Her dyspnea has worsened since May 2025, following a hip replacement and femur fracture. She experiences increased shortness of breath and a dry cough. Cough has improved with prednisone  and azithromycin  in October 2025, but she has continued to have dyspnea on exertion and she is not back to baseline.  She uses Stiolto daily and albuterol  almost daily, one to two times per day, depending on activity level. Wheezing and cough have improved, but she still has a scratchy throat and postnasal drip.  Recent chest X-ray and CT scan show no significant changes or signs of pneumonia, mucus plugging. Small nodules on CT have remained stable compared to 2018.  She has reported her Oxygen levels drop to low high 80s with activity but improve to 92-94% at rest. Fatigue is present, requiring her to stop and catch her breath during activities. She lives alone and manages daily activities independently but feels exhausted after tasks like grocery shopping.  She denies a history of heart problems and has no leg swelling.  ROS  Allergies: Alphagan p [brimonidine tartrate], Netarsudil-latanoprost, Celebrex  [celecoxib ], Codeine, Penicillins, and Sulfa antibiotics  Current Outpatient Medications:    acetaminophen  (TYLENOL ) 650 MG CR tablet, Take 1 tablet (650 mg total) by mouth every 8 (eight) hours as needed for pain., Disp: , Rfl:    albuterol  (VENTOLIN  HFA) 108 (90 Base) MCG/ACT inhaler, INHALE 2 PUFFS INTO THE LUNGS EVERY 4 HOURS AS NEEDED FOR  WHEEZING OR SHORTNESS OF BREATH., Disp: 18 each, Rfl: 2   atorvastatin  (LIPITOR) 10 MG tablet, TAKE 1 TABLET BY MOUTH EVERY DAY, Disp: 90 tablet, Rfl: 3   bifidobacterium infantis (ALIGN) capsule, Take 1 capsule by mouth daily., Disp: , Rfl:    Calcium -Phosphorus-Vitamin D  (CALCIUM /D3 ADULT GUMMIES PO), Take 2 each by mouth daily. 2 gummies daily with meal, Disp: , Rfl:    Cholecalciferol (VITAMIN D  PO), Take 2,000 mg by mouth daily., Disp: , Rfl:    Collagen-Vitamin C-Biotin (COLLAGEN PO), Take 1 Scoop by mouth daily., Disp: , Rfl:    docusate sodium  (COLACE) 100 MG capsule, Take 1 capsule (100 mg total) by mouth 2 (two) times daily., Disp: 30 capsule, Rfl: 0   doxycycline  (VIBRA -TABS) 100 MG tablet, Take 1 tablet (100 mg total) by mouth 2 (two) times daily., Disp: 14 tablet, Rfl: 0   DULoxetine  (CYMBALTA ) 60 MG capsule, TAKE 1 CAPSULE BY MOUTH EVERY DAY, Disp: 90 capsule, Rfl: 0   levothyroxine  (SYNTHROID ) 75 MCG tablet, TAKE 1 TABLET BY MOUTH EVERY DAY BEFORE BREAKFAST, Disp: 90 tablet, Rfl: 2   loratadine (CLARITIN) 10 MG tablet, Take 10 mg by mouth daily., Disp: , Rfl:    Menaquinone-7 (VITAMIN K2 PO), Take by mouth., Disp: , Rfl:    Menatetrenone (VITAMIN K2) 100 MCG TABS, Take 1 tablet by mouth daily., Disp: , Rfl:    Multiple Vitamin (MULTIVITAMINS PO), Take 1 tablet by mouth daily., Disp: , Rfl:    Multiple Vitamins-Minerals (MULTI-VITAMIN GUMMIES) CHEW, Chew 1 each by mouth., Disp: , Rfl:  polyethylene glycol (MIRALAX  / GLYCOLAX ) packet, Take 17 g by mouth daily as needed for moderate constipation., Disp: , Rfl:    predniSONE  (DELTASONE ) 20 MG tablet, Take 2 tablets (40 mg total) by mouth daily with breakfast. (Patient not taking: Reported on 03/18/2024), Disp: 10 tablet, Rfl: 0   Timolol  Maleate PF 0.5 % SOLN, Place 1 drop into the left eye in the morning and at bedtime., Disp: , Rfl:    Tiotropium Bromide -Olodaterol (STIOLTO RESPIMAT ) 2.5-2.5 MCG/ACT AERS, INHALE 2 PUFFS BY MOUTH INTO  THE LUNGS DAILY, Disp: 12 each, Rfl: 3   tiZANidine  (ZANAFLEX ) 2 MG tablet, Take 1 tablet (2 mg total) by mouth every 8 (eight) hours as needed for muscle spasms., Disp: 40 tablet, Rfl: 0   vitamin B-12 (CYANOCOBALAMIN) 100 MCG tablet, Take 100 mcg by mouth daily., Disp: , Rfl:  Past Medical History:  Diagnosis Date   Allergy    Anxiety    intermittent   Arthritis    blood in stool    Cataract    CKD (chronic kidney disease), stage III (HCC)    COPD (chronic obstructive pulmonary disease) (HCC) 03/2017   Dr. Shelah   Diverticulosis    Emphysema lung [facility])    Fibromyalgia    GERD (gastroesophageal reflux disease)    Glaucoma    Hemorrhoids    HSV-2 (herpes simplex virus 2) infection 2009   Hypothyroidism    OSA on CPAP 07/27/2006   Osteoporosis    Rectal bleeding    with hemorrhoids   Sleep apnea 2008   Dr. [facility]   Ulcer    Past Surgical History:  Procedure Laterality Date   BUNIONECTOMY  [id number]   CARPAL TUNNEL RELEASE Right 12/08/2016   Procedure: CARPAL TUNNEL RELEASE;  Surgeon: Murrell Kuba, MD;  Location: [facility];  Service: Orthopedics;  Laterality: Right;  REG/FAB   CARPAL TUNNEL RELEASE Left 07/02/2021   Procedure: CARPAL TUNNEL RELEASE LEFT;  Surgeon: Murrell Kuba, MD;  Location: [facility];  Service: Orthopedics;  Laterality: Left;  45 MIN   CERVICAL FUSION  2008   EYE SURGERY     cataract and glaucoma   hemorrhoids removed     MENISCUS REPAIR Left    SPINE SURGERY  2003   2 disc fusions in neck   TOTAL HIP ARTHROPLASTY Right 07/03/2023   Procedure: TOTAL HIP ARTHROPLASTY ANTERIOR APPROACH;  Surgeon: Yvone Rush, MD;  Location: [facility];  Service: Orthopedics;  Laterality: Right;   TRIGGER FINGER RELEASE  2008   TUBAL LIGATION  04/03/1979   Family History  Problem Relation Age of Onset   Heart attack Father    CAD Father    Cancer Father    Heart disease Father    Hyperlipidemia Mother    Hypertension  Mother    CAD Mother    Cancer Mother    Diabetes Mother    Heart disease Mother    Stroke Mother    Varicose Veins Mother    Cancer Paternal Grandmother    Diabetes Maternal Uncle    Drug abuse Daughter    ADD / ADHD Daughter    Cancer Brother    Early death Brother    Cancer Daughter    Miscarriages / Stillbirths Daughter    Diabetes Sister    Diabetes Maternal Aunt    Vision loss Maternal Aunt    Social History   Socioeconomic History   Marital status: Divorced    Spouse name:  Harold   Number of children: 3   Years of education: 16   Highest education level: Bachelor's degree (e.g., BA, AB, BS)  Occupational History   Occupation: armed forces operational officer    Comment: retired  Tobacco Use   Smoking status: Former    Current packs/day: 0.00    Average packs/day: 2.0 packs/day for 25.0 years (50.0 ttl pk-yrs)    Types: Cigarettes    Start date: 03/07/1956    Quit date: 03/07/1981    Years since quitting: 43.1    Passive exposure: Never   Smokeless tobacco: Never  Vaping Use   Vaping status: Never Used  Substance and Sexual Activity   Alcohol use: Not Currently    Comment: rare   Drug use: No   Sexual activity: Not Currently  Other Topics Concern   Not on file  Social History Narrative   Lives alone. She enjoys going to church.    Social Drivers of Health   Tobacco Use: Medium Risk (03/18/2024)   Patient History    Smoking Tobacco Use: Former    Smokeless Tobacco Use: Never    Passive Exposure: Never  Physicist, Medical Strain: Low Risk (05/22/2023)   Overall Financial Resource Strain (CARDIA)    Difficulty of Paying Living Expenses: Not very hard  Food Insecurity: No Food Insecurity (10/08/2023)   [facility] from [facility]   Epic    Within the past 12 months, you worried that your food would run out before you got the money to buy more.: Never true    Within the past 12 months, the food you bought just didn't last and you didn't have money to get more.: Never  true  Transportation Needs: No Transportation Needs (10/08/2023)   Received from [facility] - Transportation    Lack of Transportation (Medical): No    Lack of Transportation (Non-Medical): No  Physical Activity: Unknown (05/22/2023)   Exercise Vital Sign    Days of Exercise per Week: 0 days    Minutes of Exercise per Session: Not on file  Stress: No Stress Concern Present (10/06/2023)   Received from [facility] - Occupational Stress Questionnaire    Feeling of Stress : Not at all  Social Connections: Unknown (07/03/2023)   Social Connection and Isolation Panel    Frequency of Communication with Friends and Family: Never    Frequency of Social Gatherings with Friends and Family: Patient declined    Attends Religious Services: More than 4 times per year    Active Member of Clubs or Organizations: Yes    Attends Banker Meetings: More than 4 times per year    Marital Status: Divorced  Intimate Partner Violence: Not At Risk (10/06/2023)   Received from [facility]   HITS    Over the last 12 months how often did your partner physically hurt you?: Never    Over the last 12 months how often did your partner insult you or talk down to you?: Never    Over the last 12 months how often did your partner threaten you with physical harm?: Never    Over the last 12 months how often did your partner scream or curse at you?: Never  Depression (PHQ2-9): Low Risk (02/21/2024)   Depression (PHQ2-9)    PHQ-2 Score: 4  Recent Concern: Depression (PHQ2-9) - Medium Risk (01/18/2024)   Depression (PHQ2-9)    PHQ-2 Score: 8  Alcohol Screen: Low Risk (05/22/2023)  Alcohol Screen    Last Alcohol Screening Score (AUDIT): 1  Housing: Low Risk (10/08/2023)   Received from [facility]   Epic    At any time in the past 12 months, were you homeless or living in a shelter (including now)?: No    In the past 12 months, how many times have you  moved where you were living?: 0    In the last 12 months, was there a time when you were not able to pay the mortgage or rent on time?: No  Utilities: Not At Risk (10/08/2023)   Received from [facility]    Threatened with loss of utilities: No  Health Literacy: Not on file       Objective:  There were no vitals taken for this visit. Wt Readings from Last 3 Encounters:  03/18/24 144 lb 9.6 oz (65.6 kg)  02/21/24 144 lb 0.6 oz (65.3 kg)  01/18/24 143 lb 1.9 oz (64.9 kg)   SpO2 Readings from Last 3 Encounters:  03/18/24 96%  02/21/24 94%  01/18/24 97%    Physical Exam Constitutional:      Appearance: Normal appearance.  HENT:     Head: Normocephalic.  Eyes:     Extraocular Movements: Extraocular movements intact.     Pupils: Pupils are equal, round, and reactive to light.  Cardiovascular:     Rate and Rhythm: Normal rate and regular rhythm.  Pulmonary:     Effort: Pulmonary effort is normal.     Breath sounds: Normal breath sounds.     Comments: No wheezing, no crackles. Musculoskeletal:        General: Normal range of motion.     Comments: No LE edema  Skin:    General: Skin is warm.  Neurological:     General: No focal deficit present.     Mental Status: She is alert and oriented to person, place, and time.    Diagnostic Review:   CT chest 02/29/2024 1. No acute intrathoracic pathology. 2. Several scattered pulmonary nodules measure up to 6 mm and present on the CT of 2018. 3. Aortic Atherosclerosis (ICD10-I70.0) and Emphysema  03/18/24 TTE EF 55-60%. No WMA. Diastolic dysfunction grade I. RV normal. No Valvulopathy  Assessment & Plan:   Assessment & Plan Chronic obstructive pulmonary disease (COPD) with persistent dyspnea Centrilobular emphysema OSA on CPAP  COPD exacerbation treated with prednisone  and azithromycin  a month ago. Persistent dyspnea on exertion and with daily activities with normal CT scan. She denied sputum production  and improvement of dry cough.  Given her normal CT scan I dont think her symptoms are related to her COPD. Differential includes cardiac causes. No recent cardiac evaluation. OSA in cpap machine. She has stable multiple pulmonary nodules, given her age, I dont think she will benefit for lung cancer screening.  - Ordered echocardiogram to evaluate cardiac function. - probnp - I will hold to add steroid inhalers given her age and increased pneumonia risk. - Encouraged increased physical activity at home, such as walking laps three times a day. - I will do a walking test at the next appointment. -I will order a f/u CT scan, and I will reassess if she would like to continue f/u images  Obstructiuon on spirometry  needs anoro lama/cs or breztri Smoking???    Deconditioning following recent left femur fracture and hip replacement Deconditioning post-surgeries with persistent fatigue and dyspnea. Physical therapy was discontinued. Possible deconditioning contributing to symptoms. - Encouraged gradual  increase in physical activity to improve conditioning. - Advised monitoring oxygen levels during activity. - Will reassess in six weeks to evaluate progress and response to increased activity.   No follow-ups on file.    Marny Patch, MD Pulmonary and Critical Care Medicine [facility] "

## 2024-05-08 ENCOUNTER — Ambulatory Visit

## 2024-05-08 VITALS — BP 130/80 | HR 80 | Temp 97.9°F | Ht 62.0 in | Wt 141.4 lb

## 2024-05-08 DIAGNOSIS — G4733 Obstructive sleep apnea (adult) (pediatric): Secondary | ICD-10-CM | POA: Diagnosis not present

## 2024-05-08 DIAGNOSIS — J432 Centrilobular emphysema: Secondary | ICD-10-CM | POA: Diagnosis not present

## 2024-05-08 DIAGNOSIS — Z87891 Personal history of nicotine dependence: Secondary | ICD-10-CM

## 2024-05-08 DIAGNOSIS — R0600 Dyspnea, unspecified: Secondary | ICD-10-CM

## 2024-05-08 DIAGNOSIS — J449 Chronic obstructive pulmonary disease, unspecified: Secondary | ICD-10-CM

## 2024-05-08 MED ORDER — TIOTROPIUM BROMIDE-OLODATEROL 2.5-2.5 MCG/ACT IN AERS: 2.0000 | INHALATION_SPRAY | Freq: Every day | RESPIRATORY_TRACT | 2 refills | Status: AC

## 2024-05-08 MED ORDER — TIOTROPIUM BROMIDE-OLODATEROL 2.5-2.5 MCG/ACT IN AERS: 2.0000 | INHALATION_SPRAY | Freq: Every day | RESPIRATORY_TRACT | 3 refills | Status: DC

## 2024-05-08 NOTE — Patient Instructions (Addendum)
 Dear Ms. Portilla,   I will recommend the following: Continue Stiolto inhaler 2 puffs in the morning. I will order a pulmonary function test at the next appointment. The ultrasound of your heart was normal. I will order a CT chest in April to follow up the nodules.  Monitor your oxygen on exertion if it is <88% you may need oxygen.  Continue to exercise and walking every day.  Happy New Year!  I will see you in May.

## 2024-05-23 ENCOUNTER — Encounter: Payer: Self-pay | Admitting: Family Medicine

## 2024-05-23 ENCOUNTER — Ambulatory Visit: Admitting: Family Medicine

## 2024-05-23 VITALS — BP 131/71 | HR 77 | Ht 62.0 in | Wt 140.0 lb

## 2024-05-23 DIAGNOSIS — R918 Other nonspecific abnormal finding of lung field: Secondary | ICD-10-CM | POA: Diagnosis not present

## 2024-05-23 DIAGNOSIS — I2510 Atherosclerotic heart disease of native coronary artery without angina pectoris: Secondary | ICD-10-CM

## 2024-05-23 DIAGNOSIS — E039 Hypothyroidism, unspecified: Secondary | ICD-10-CM

## 2024-05-23 DIAGNOSIS — N1831 Chronic kidney disease, stage 3a: Secondary | ICD-10-CM

## 2024-05-23 DIAGNOSIS — J432 Centrilobular emphysema: Secondary | ICD-10-CM | POA: Diagnosis not present

## 2024-05-23 DIAGNOSIS — Z23 Encounter for immunization: Secondary | ICD-10-CM | POA: Diagnosis not present

## 2024-05-23 NOTE — Assessment & Plan Note (Signed)
 Continue to monitor renal function every 6 months.  Last serum creatinine looked fantastic.  Plan to recheck again in the spring.  Lab Results  Component Value Date   CREATININE 0.75 02/21/2024

## 2024-05-23 NOTE — Progress Notes (Signed)
 "  Established Patient Office Visit  Patient ID: [name] [name], female    DOB: 03/09/1941  Age: 84 y.o. MRN: [id number] PCP: Alvan Dorothyann BIRCH, MD  Chief Complaint  Patient presents with   Hypertension    Subjective:     HPI  Discussed the use of AI scribe software for clinical note transcription with the patient, who gave verbal consent to proceed.  History of Present Illness [name] [name] is an 84 year old female who presents for follow-up on mobility and respiratory issues.  Mobility impairment and musculoskeletal symptoms - Ambulates independently within her home - Requires a cane for stability on uneven outdoor surfaces such as grass and parking lots - Persistent weakness in arms and hands - Soreness and fatigue in hip joints, status post surgical intervention - Achiness in hips  Cough and respiratory symptoms - History of asthma - Persistent coughing spells, previously managed with prednisone  - Cough resolved temporarily after prednisone  but recurred a few days ago - Cough episodes last almost all day - Throat irritation triggers coughing - No sputum production - Under care of pulmonologist for chest nodules, with follow-up testing scheduled for April  Thyroid  disorder and associated symptoms - Stable on current thyroid  medication regimen - No significant changes in thyroid -related symptoms - Dry skin, attributed to winter season     ROS    Objective:     BP 131/71   Pulse 77   Ht 5' 2 (1.575 m)   Wt 140 lb (63.5 kg)   SpO2 100%   BMI 25.61 kg/m    Physical Exam Vitals and nursing note reviewed.  Constitutional:      Appearance: Normal appearance.  HENT:     Head: Normocephalic and atraumatic.  Eyes:     Conjunctiva/sclera: Conjunctivae normal.  Cardiovascular:     Rate and Rhythm: Normal rate and regular rhythm.  Pulmonary:     Effort: Pulmonary effort is normal.     Breath sounds: Normal breath sounds.  Skin:     General: Skin is warm and dry.  Neurological:     Mental Status: She is alert.  Psychiatric:        Mood and Affect: Mood normal.      No results found for any visits on 05/23/24.    The ASCVD Risk score (Arnett DK, et al., 2019) failed to calculate for the following reasons:   The 2019 ASCVD risk score is only valid for ages 86 to 26   * - Cholesterol units were assumed    Assessment & Plan:   Problem List Items Addressed This Visit       Cardiovascular and Mediastinum   ASCVD (arteriosclerotic cardiovascular disease) (Chronic)   One-vessel CAD on chest CT in 2018.  Continue atorvastatin .        Respiratory   Pulmonary nodules (Chronic)   For updated scan and April.      Centrilobular emphysema (HCC)     Endocrine   Hypothyroid     Genitourinary   CKD (chronic kidney disease), stage III (HCC)   Continue to monitor renal function every 6 months.  Last serum creatinine looked fantastic.  Plan to recheck again in the spring.  Lab Results  Component Value Date   CREATININE 0.75 02/21/2024         Other Visit Diagnoses       Encounter for immunization    -  Primary   Relevant Orders   Pfizer Comirnaty Covid-19 Vaccine 83yrs & older (  Completed)       Assessment and Plan Assessment & Plan Centrilobular emphysema and asthma Intermittent cough and throat irritation likely due to environmental triggers. Recent prednisone  course provided temporary relief. No significant changes in chest nodules, consistent with age-related findings. - Continue follow-up with pulmonologist in April for further evaluation and testing.  Acquired hypothyroidism Condition well-managed on current thyroid  medication. No recent changes in symptoms or side effects. - Continue current thyroid  medication regimen.    Return in about 6 months (around 11/20/2024) for Thyroid  and lungs.    Dorothyann Byars, MD [facility] at [facility]   "

## 2024-05-23 NOTE — Assessment & Plan Note (Signed)
 One-vessel CAD on chest CT in 2018.  Continue atorvastatin .

## 2024-05-23 NOTE — Assessment & Plan Note (Signed)
 For updated scan and April.

## 2024-08-27 ENCOUNTER — Ambulatory Visit (HOSPITAL_BASED_OUTPATIENT_CLINIC_OR_DEPARTMENT_OTHER)

## 2024-10-07 ENCOUNTER — Ambulatory Visit

## 2024-11-21 ENCOUNTER — Ambulatory Visit: Admitting: Family Medicine
